# Patient Record
Sex: Female | Born: 1935
Health system: Southern US, Community
[De-identification: ages and names within clinical notes are randomized; demographics above are authoritative.]

## PROBLEM LIST (undated history)

## (undated) DIAGNOSIS — I10 Essential (primary) hypertension: Secondary | ICD-10-CM

## (undated) DIAGNOSIS — E785 Hyperlipidemia, unspecified: Secondary | ICD-10-CM

## (undated) DIAGNOSIS — I4891 Unspecified atrial fibrillation: Secondary | ICD-10-CM

## (undated) DIAGNOSIS — Z950 Presence of cardiac pacemaker: Secondary | ICD-10-CM

## (undated) DIAGNOSIS — Z9889 Other specified postprocedural states: Secondary | ICD-10-CM

## (undated) DIAGNOSIS — E039 Hypothyroidism, unspecified: Secondary | ICD-10-CM

## (undated) DIAGNOSIS — I495 Sick sinus syndrome: Secondary | ICD-10-CM

## (undated) DIAGNOSIS — I480 Paroxysmal atrial fibrillation: Secondary | ICD-10-CM

## (undated) HISTORY — DX: Hyperlipidemia, unspecified: E78.5

## (undated) HISTORY — DX: Essential (primary) hypertension: I10

## (undated) HISTORY — DX: Hypothyroidism, unspecified: E03.9

## (undated) HISTORY — DX: Presence of cardiac pacemaker: Z95.0

## (undated) HISTORY — DX: Paroxysmal atrial fibrillation: I48.0

## (undated) HISTORY — DX: Unspecified atrial fibrillation: I48.91

## (undated) HISTORY — DX: Other specified postprocedural states: Z98.890

## (undated) HISTORY — DX: Sick sinus syndrome: I49.5

---

## 1987-06-29 HISTORY — PX: ABDOMINAL HYSTERECTOMY: SHX81

## 1999-04-27 ENCOUNTER — Other Ambulatory Visit: Admission: RE | Admit: 1999-04-27 | Discharge: 1999-04-27 | Payer: Self-pay | Admitting: Obstetrics and Gynecology

## 2006-07-12 ENCOUNTER — Encounter: Admission: RE | Admit: 2006-07-12 | Discharge: 2006-07-12 | Payer: Self-pay | Admitting: Gastroenterology

## 2007-03-28 HISTORY — PX: NM MYOVIEW LTD: HXRAD82

## 2007-04-29 HISTORY — PX: PACEMAKER PLACEMENT: SHX43

## 2007-05-19 ENCOUNTER — Inpatient Hospital Stay (HOSPITAL_COMMUNITY): Admission: EM | Admit: 2007-05-19 | Discharge: 2007-05-25 | Payer: Self-pay | Admitting: Emergency Medicine

## 2007-06-13 ENCOUNTER — Emergency Department (HOSPITAL_COMMUNITY): Admission: EM | Admit: 2007-06-13 | Discharge: 2007-06-13 | Payer: Self-pay | Admitting: Emergency Medicine

## 2007-08-15 ENCOUNTER — Encounter: Admission: RE | Admit: 2007-08-15 | Discharge: 2007-08-15 | Payer: Self-pay | Admitting: Cardiology

## 2007-08-18 ENCOUNTER — Ambulatory Visit (HOSPITAL_COMMUNITY): Admission: RE | Admit: 2007-08-18 | Discharge: 2007-08-18 | Payer: Self-pay | Admitting: Cardiology

## 2007-08-18 ENCOUNTER — Encounter (INDEPENDENT_AMBULATORY_CARE_PROVIDER_SITE_OTHER): Payer: Self-pay | Admitting: Cardiology

## 2007-11-14 ENCOUNTER — Encounter (INDEPENDENT_AMBULATORY_CARE_PROVIDER_SITE_OTHER): Payer: Self-pay | Admitting: Cardiology

## 2007-11-14 ENCOUNTER — Ambulatory Visit (HOSPITAL_COMMUNITY): Admission: RE | Admit: 2007-11-14 | Discharge: 2007-11-14 | Payer: Self-pay | Admitting: Cardiology

## 2008-01-11 HISTORY — PX: CARDIAC CATHETERIZATION: SHX172

## 2008-01-23 ENCOUNTER — Ambulatory Visit: Payer: Self-pay | Admitting: Thoracic Surgery (Cardiothoracic Vascular Surgery)

## 2008-02-05 ENCOUNTER — Ambulatory Visit: Payer: Self-pay | Admitting: Thoracic Surgery (Cardiothoracic Vascular Surgery)

## 2008-02-05 ENCOUNTER — Encounter
Admission: RE | Admit: 2008-02-05 | Discharge: 2008-02-05 | Payer: Self-pay | Admitting: Thoracic Surgery (Cardiothoracic Vascular Surgery)

## 2008-02-23 ENCOUNTER — Encounter: Payer: Self-pay | Admitting: Cardiothoracic Surgery

## 2008-02-23 ENCOUNTER — Ambulatory Visit: Admission: RE | Admit: 2008-02-23 | Discharge: 2008-02-23 | Payer: Self-pay | Admitting: Cardiothoracic Surgery

## 2008-02-26 ENCOUNTER — Ambulatory Visit: Payer: Self-pay | Admitting: Thoracic Surgery (Cardiothoracic Vascular Surgery)

## 2008-02-27 ENCOUNTER — Ambulatory Visit: Payer: Self-pay | Admitting: Thoracic Surgery (Cardiothoracic Vascular Surgery)

## 2008-02-27 ENCOUNTER — Inpatient Hospital Stay (HOSPITAL_COMMUNITY)
Admission: RE | Admit: 2008-02-27 | Discharge: 2008-03-04 | Payer: Self-pay | Admitting: Thoracic Surgery (Cardiothoracic Vascular Surgery)

## 2008-02-27 HISTORY — PX: MINIMALLY INVASIVE MAZE PROCEDURE: SHX6244

## 2008-02-27 HISTORY — PX: MITRAL VALVE REPAIR: SHX2039

## 2008-03-13 ENCOUNTER — Ambulatory Visit: Payer: Self-pay | Admitting: Thoracic Surgery (Cardiothoracic Vascular Surgery)

## 2008-03-13 ENCOUNTER — Encounter
Admission: RE | Admit: 2008-03-13 | Discharge: 2008-03-13 | Payer: Self-pay | Admitting: Thoracic Surgery (Cardiothoracic Vascular Surgery)

## 2008-03-22 ENCOUNTER — Encounter: Admission: RE | Admit: 2008-03-22 | Discharge: 2008-03-22 | Payer: Self-pay | Admitting: Cardiovascular Disease

## 2008-03-25 ENCOUNTER — Ambulatory Visit: Payer: Self-pay | Admitting: Thoracic Surgery (Cardiothoracic Vascular Surgery)

## 2008-05-20 ENCOUNTER — Ambulatory Visit: Payer: Self-pay | Admitting: Thoracic Surgery (Cardiothoracic Vascular Surgery)

## 2008-05-20 ENCOUNTER — Encounter
Admission: RE | Admit: 2008-05-20 | Discharge: 2008-05-20 | Payer: Self-pay | Admitting: Thoracic Surgery (Cardiothoracic Vascular Surgery)

## 2008-08-12 ENCOUNTER — Ambulatory Visit: Payer: Self-pay | Admitting: Thoracic Surgery (Cardiothoracic Vascular Surgery)

## 2008-08-12 ENCOUNTER — Encounter
Admission: RE | Admit: 2008-08-12 | Discharge: 2008-08-12 | Payer: Self-pay | Admitting: Thoracic Surgery (Cardiothoracic Vascular Surgery)

## 2009-04-07 ENCOUNTER — Ambulatory Visit: Payer: Self-pay | Admitting: Thoracic Surgery (Cardiothoracic Vascular Surgery)

## 2010-03-10 IMAGING — CT CT ANGIO CHEST
4 of 8 series · 13 of 31 positions shown · IV contrast (agent unspecified)
Comparison: CT chest 06/13/2007

CLINICAL DATA: Mitral valve regurgitation..

CT ANGIOGRAPHY CHEST
TECHNIQUE: Multidetector CT imaging of the chest using the
standard protocol during bolus administration of intravenous
contrast. Multiplanar reconstructed images obtained and reviewed to
evaluate the vascular anatomy.
Contrast: 125 ml [id number]

[Series 2: non contrast · axial · 0.66mm/px · z∈[-142,-127]mm · 2 of 51 slices shown]
[im 26/51  lung]
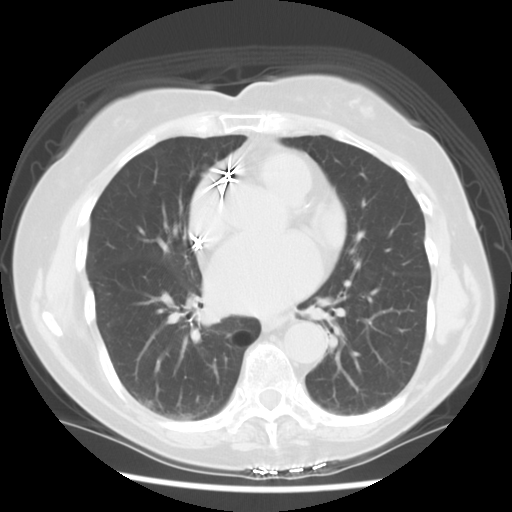
[im 29/51  lung]
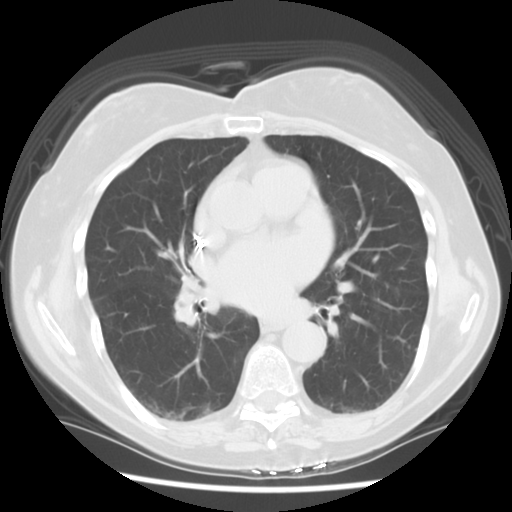

[Series 4: cta chest · axial · 0.66mm/px · z∈[-232,-34]mm · 5 of 133 slices shown]
[im 27/133  lung]
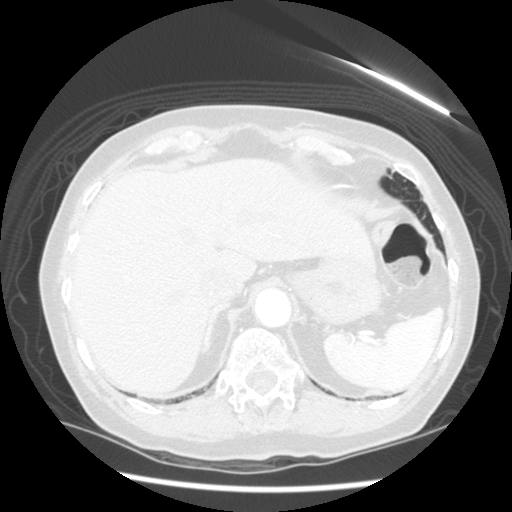
[im 53/133  mediastinal]
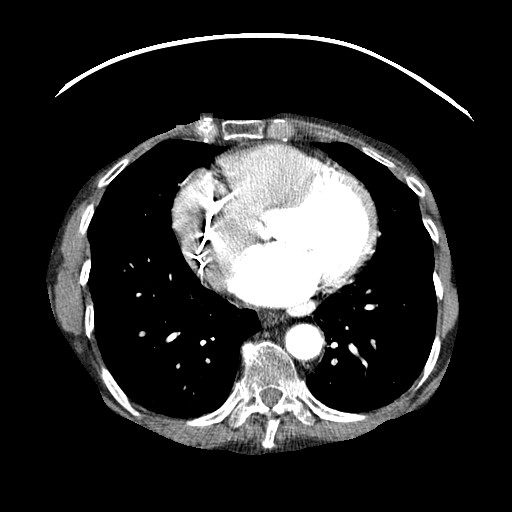
[im 69/133  lung]
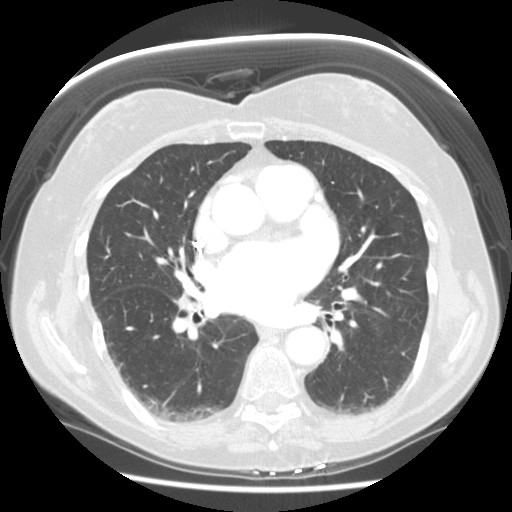
[im 80/133  mediastinal]
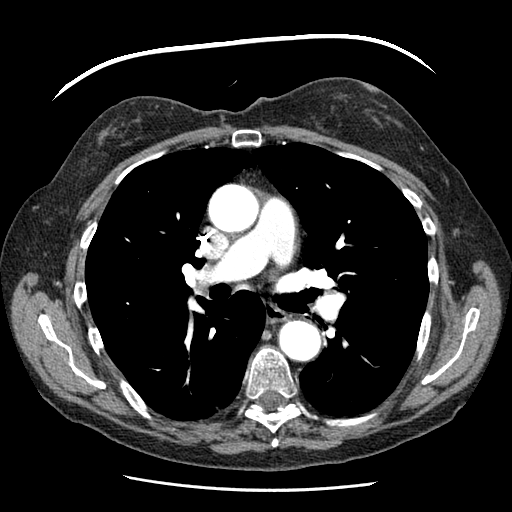
[im 106/133  lung]
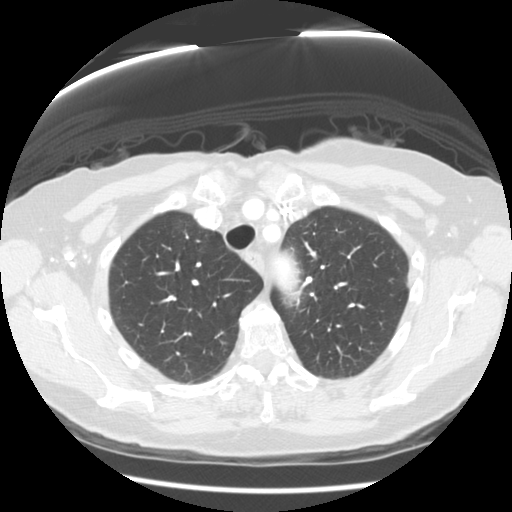

[Series 600: coronal · coronal · 0.66mm/px · 3 of 103 slices shown]
[im 26/103  lung]
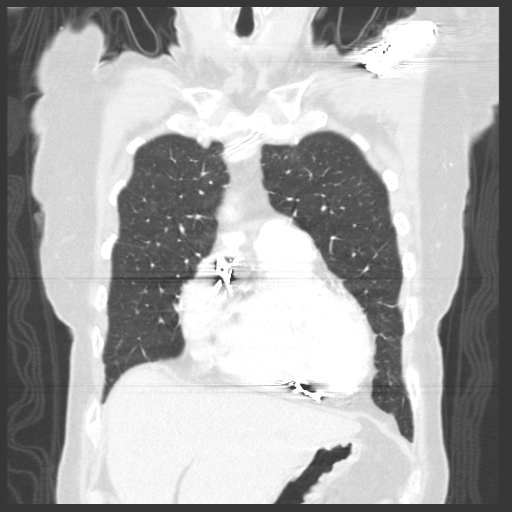
[im 52/103  lung]
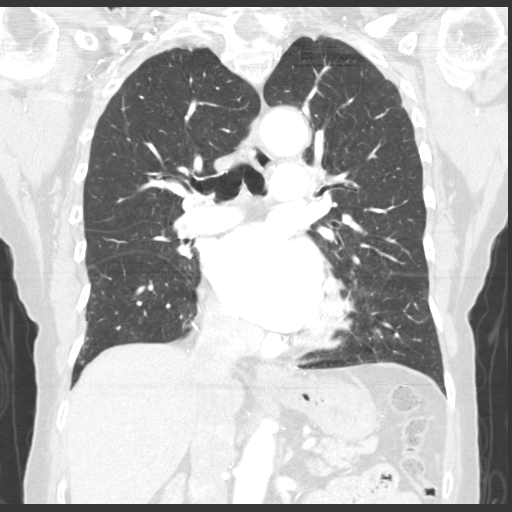
[im 77/103  lung]
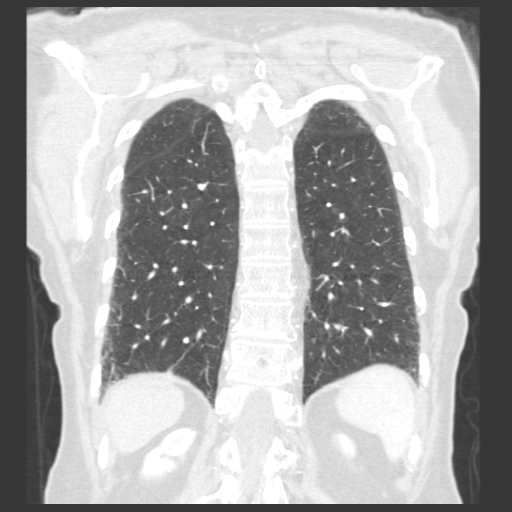

[Series 603: sagittal · sagittal · 0.66mm/px · 3 of 116 slices shown]
[im 29/116  lung]
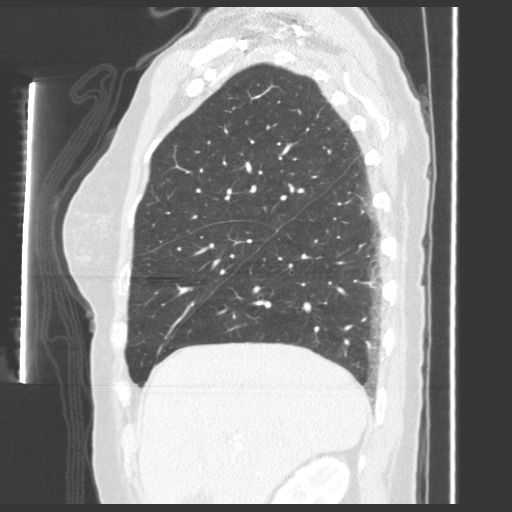
[im 58/116  lung]
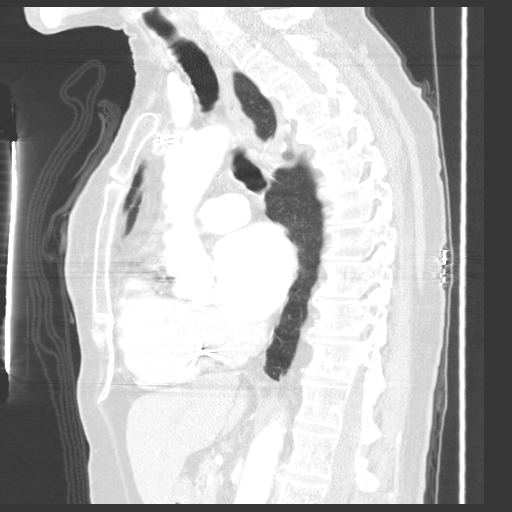
[im 87/116  lung]
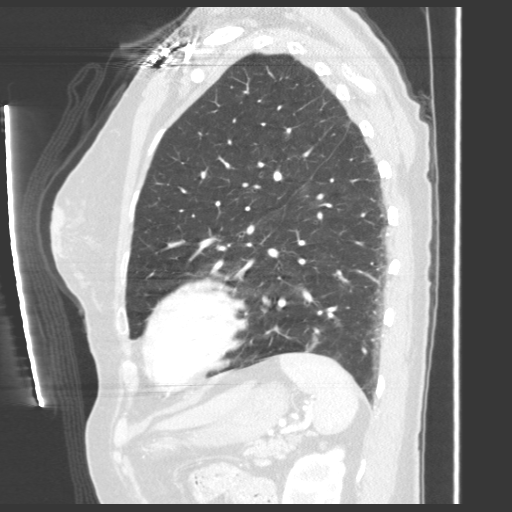

[13 of 31 positions shown; findings below may reference images not displayed]

FINDINGS: The thoracic aorta appears normal with a maximal
transverse diameter of the ascending thoracic aorta of
approximately 3.1 cm in maximal diameter of the descending thoracic
aorta of approximately 3 cm.

Overall heart size is normal.  Transvenous pacemaker is in place.
There is a stable small right lower lobe pleural-based nodule
measuring 3 x 5 mm in diameter on image number 37 of series 5.
This  is unchanged since 06/13/2007 .

There is no hilar or mediastinal adenopathy.

The patient does have two low density lesions in the pancreas, one
in the tail measuring 10 mm in diameter and one in the body
measuring 9 mm in diameter.  These most likely represent tiny
mucinous neoplasms.  I recommend abdominal MRI with contrast for
further evaluation of these lesions.
IMPRESSION: 1.No significant abnormality of the chest.  Specifically, no
evidence of aortic aneurysm.
2.  Small low-density lesions in the pancreas, possibly
representing mucinous neoplasms.  I recommend abdominal MRI for
with contrast with attention to the pancreas for further evaluation
of these small pancreatic lesions.

## 2010-07-20 ENCOUNTER — Encounter: Payer: Self-pay | Admitting: Thoracic Surgery (Cardiothoracic Vascular Surgery)

## 2010-11-10 NOTE — Op Note (Signed)
NAMEEVELLA, KASAL               ACCOUNT NO.:  [id number]   MEDICAL RECORD NO.:  [id number]          PATIENT TYPE:  INP   LOCATION:  2311                         FACILITY:  [facility]   PHYSICIAN:  Salvatore Decent. Cornelius Moras, M.D. DATE OF BIRTH:  [birth year]   DATE OF PROCEDURE:  02/27/2008  DATE OF DISCHARGE:                               OPERATIVE REPORT   PREOPERATIVE DIAGNOSIS:  Bleeding status post right miniature  thoracotomy for mitral valve repair and Maze procedure.   POSTOPERATIVE DIAGNOSIS:  Bleeding status post right miniature  thoracotomy for mitral valve repair and Maze procedure.   PROCEDURE:  Re-exploration for bleeding with extension of right  thoracotomy.   SURGEON:  Salvatore Decent. Cornelius Moras, MD   ASSISTANT:  Doree Fudge, PA-C   SECOND ASSISTANT:  Ms. Gus Puma.   ANESTHESIA:  General.   BRIEF CLINICAL NOTE:  The patient is a 77 year old female who underwent  right miniature thoracotomy for mitral valve repair and Maze procedure  on February 27, 2008.  The patient was initially stable upon return to  the Surgical Intensive Care Unit.  Within the next several hours, the  patient developed increasing amounts of chest tube output, primarily  from the right pleural tube.  The patient developed acute blood loss  anemia with significant drop in hemoglobin and hematocrit.  Repeat chest  x-ray revealed increased opacity of the right hemithorax suggestive of  hemothorax.  The patient is subsequently brought directly back to the  operating room for reexploration.  The patient's husband has been  notified regarding the need for return to the operating room and he  provided consent to proceed with surgery as described.   OPERATIVE NOTE DETAIL:  The patient was brought to the operating room on  the evening of February 27, 2008, and placed in the supine position on  the operating table.  Adequate anesthesia was verified under the care  and direction of Dr. Autumn Patty.   Initially, attempts were made  by Dr. Sampson Goon to use an endobronchial blocker to facilitate single-  lung ventilation, but these were unsuccessful warranting the need to  change the tube to a dual-lumen tube.  The patient's existing single-  lumen endotracheal tube was switched to a dual-lumen endotracheal tube.  This was performed uneventfully.  The patient was positioned with a roll  behind the right shoulder with the arms at the side.  The patient's  chest, abdomen, both groins, and the upper portion of both thighs were  prepared and draped in sterile manner.   The previous right miniature thoracotomy incision was reopened.  There  was evidence of large amount of blood including some clotted blood  within the right hemithorax.  The thoracotomy incision was extended in  both directions to facilitate adequate exposure.  The fourth costal  cartilage was cut anteriorly to facilitate exposure.  All the blood in  the right hemithorax was evacuated.  There was no blood in the  pericardial sac.  The pericardial sutures were removed.  Both the left  and right atriotomy incisions were perfectly intact.  All pacing wire  sites  were intact.  Cardioplegic cannulation sites were intact.  There  was no sign of any significant bleeding from the heart or mediastinum.  The right hemithorax was irrigated with copious warm saline solution.  There was some oozing from the right lateral chest wall associated with  the previous thoracotomy incision and the new extension of the incision  in both directions.  However, no significant bleeding such as an  intercostal bleeder was identified.  All previous port incisions and  trocar incisions were carefully examined.  The internal mammary artery  was intact medially.  All chest tube sites were carefully inspected.  Most significant bleeding was identified at the right apex.  There was  bleeding in the subpleural space suggestive of possible penetrating  injury  to the confluence of the right subclavian vein and right internal  jugular vein where they come together just above the superior vena cava.  The discrete injury cannot be clearly identified, but there was  definitely bleeding from this vicinity.  This area was packed with  gauze, pads, and direct manual pressures applied.  The remainder of the  hemithorax was carefully examined.  Some minor sites of oozing were  identified circumferentially with no other areas of bleeding that might  account for volume of blood loss can be identified.  Throughout this  process, the patient was resuscitated by the Anesthesia team.  Blood  products were administered to return normal hemoglobin and to correct  the patient's underlying coagulopathy.  The areas of oozing in the right  apex were also packed with MRDH to facilitate the topical hemostatic  agent.  This has continued and packing was continued, and the wounds  were carefully reexamined until hemostasis was ascertained.  No other  sites of significant bleeding were identified and again all potential  sites of surgical bleeding were again examined carefully.  A 30-degree  endoscopic scope was utilized to facilitate exposure of the entire chest  wall circumferentially.  The dome of the diaphragm was carefully  examined.  There were no sign of injury to the diaphragm.  No other  abnormalities were identified.  Once adequate hemostasis was  ascertained, the right pleural space was now drained using 36-French  right-angle chest tube and a 28 Bard drain.  The preexisting Bard drain  in the pericardium was left in place.  The right thoracotomy was then  closed in multiple layers with a single sternal wire to reapproximate  the fourth costal cartilage, and the skin incision closed with  subcuticular skin closure.  The patient was transported back to the  Surgical Intensive Care Unit in stable condition.  The dual lumen  endotracheal tube was left in place  due to concerns regarding  significant airway edema and risks potentially associated with another  attempt at changing the endotracheal tube.      Salvatore Decent. Cornelius Moras, M.D.  Electronically Signed     CHO/MEDQ  D:  02/27/2008  T:  02/28/2008  Job:  [id number]

## 2010-11-10 NOTE — Assessment & Plan Note (Signed)
OFFICE VISIT   [name], PRICER  DOB:  [birth year]                                        May 20, 2008  CHART #:  [id number]   HISTORY OF PRESENT ILLNESS:  The patient returns for followup now just  slightly less than 3 months status post right thoracotomy for mitral  valve repair and Cox-CryoMaze procedure.  She was last seen here in the  office on March 25, 2008, at that time, she was progressing fairly  well.  Since then she has continued to improve.  She states that she is  getting around well at this point in time with only very mild exertional  shortness of breath.  This only seems to be noticeable if she goes up a  flight of stairs.  She is otherwise back to fairly normal activity.  She  still has soreness in the right chest wall from her thoracotomy incision  and the area of numbness and hypersensitivity underneath her right  breast.  She also has a wound spot that is sore anteriorly that  corresponds to the costal cartilage associated with her fourth rib  interspace.  She denies any sensation of motion or clicking.  She denies  any fevers, chills, or productive cough.  She denies any sensation of  tachypalpitations.  She has not had any dizzy spells.  She is not aware  of any irregular heart rhythms.  Overall, she has no complaints.  The  remainder of her past medical history is unchanged.   CURRENT MEDICATIONS:  Coumadin, amiodarone, Lopressor, Lasix, Zyrtec,  and Tylenol.   PHYSICAL EXAMINATION:  GENERAL:  Notable for a well-appearing female.  VITAL SIGNS:  Blood pressure 153/83 and pulse is 53.  A 2-channel  telemetry rhythm strip demonstrates what appears to be atrial paced  rhythm.  Oxygen saturation is 99% on room air.  CHEST:  Clear breath sounds, which are symmetrical bilaterally.  No  wheezes or rhonchi are noted.  The right thoracotomy incision has healed  nicely.  CARDIOVASCULAR:  Demonstrates regular rate and rhythm.  No  murmurs,  rubs, or gallops are noted.  ABDOMEN:  Soft, nontender.  EXTREMITIES:  Warm and well perfused.  The right groin incision healed  nicely.  There is mild bilateral lower extremity edema.  The remainder of her physical exam is unremarkable.   IMPRESSION:  Satisfactory progress now almost 3 months following right  thoracotomy for mitral valve repair and a maze procedure.  At this  point, the patient appears to be maintaining atrial paced rhythm.  Her  exercise tolerance has gradually improved.  She seems to be doing  acceptably well under the circumstances.   PLAN:  I have encouraged her to continue to increase her activity as  tolerated.  Dr. Tresa Endo is in the process of weaning her amiodarone, and I  think that she could be tried off of amiodarone within the next month or  two.  If she remains stable for a period of time after that, it is  possible that she could come off of Coumadin entirely.   PLAN:  We will ask the patient to return in 3 months' time for followup  and rhythm check.  All of her questions have been addressed.   Salvatore Decent. Cornelius Moras, M.D.  Electronically Signed   CHO/MEDQ  D:  05/20/2008  T:  05/20/2008  Job:  [id number]   cc:   Nicki Guadalajara, M.D.  Ritta Slot, MD  Antony Madura, M.D.

## 2010-11-10 NOTE — Assessment & Plan Note (Signed)
OFFICE VISIT   [name], [name]  DOB:  29-Oct-1928                                        May 20, 2008  CHART #:  [id number]   ADDENDUM:  The patient underwent chest x-ray at the [facility] today.  This demonstrates stable radiographic appearance  following her recent thoracotomy.  There is a small amount of loculated  pleural fluid laterally in the right mid chest that appears to have  decreased somewhat in size.  There is a very small right pleural  effusion at the base.  Overall, the rest of the lung fields are clear.  No other abnormalities are noted.   Salvatore Decent. Cornelius Moras, M.D.  Electronically Signed   CHO/MEDQ  D:  05/20/2008  T:  05/20/2008  Job:  [id number]

## 2010-11-10 NOTE — Assessment & Plan Note (Signed)
OFFICE VISIT   [name], [name]  DOB:  Nov 04, 1928                                        April 07, 2009  CHART #:  [id number]   HISTORY OF PRESENT ILLNESS:  The patient returns to the office today for  routine followup, now approximately 1 year status post right thoracotomy  for mitral valve repair and Maze procedure.  She was last seen here in  the office on August 12, 2008.  Since then, she has continued to do  well.  However, at the time of her last telephone pacemaker check.  She  apparently was found to have been having atrial fibrillation.  The  details of this evaluation are not available, but subsequent to this  patient has been placed back on amiodarone and Coumadin.  She has never  had any symptoms, and she continues to feel well.  She denies any  shortness of breath.  She reports that she no longer has any significant  residual soreness in her chest, and her activity level is fairly good.  The remainder of her review of systems is unremarkable.  The remainder  of her past medical history is unchanged.   CURRENT MEDICATIONS:  Coumadin, amiodarone,  triamterene/hydrochlorothiazide, metoprolol, and Folast.   PHYSICAL EXAMINATION:  General:  Well-appearing female.  Vital Signs:  Blood pressure 126/69, pulse 63 and 2-channel telemetry rhythm strip  demonstrates paced rhythm.  Chest:  Clear breath sounds, which are  symmetrical.  No wheezes or rhonchi are noted.  Cardiovascular:  Regular  rate and rhythm.  No murmurs, rubs, or gallops are appreciated.  Abdomen:  Soft, nontender.  Extremities:  Warm and well perfused.  There  is mild bilateral lower extremity edema, more so on the left than the  right.  The remainder of her physical exam is unrevealing.   IMPRESSION:  The patient is doing well from a cardiovascular standpoint,  although she apparently was found to have some occult asymptomatic  atrial arrhythmias at the time of her most recent  pacemaker  interrogation over the telephone.  She has been replaced on amiodarone  and Coumadin since then.  The details for rhythm disturbance are not  currently available, and she has not yet been seen in follow up by Dr.  Tresa Endo.  She states that she does have an appointment with him within the  next month or so.   PLAN:  We will defer any subsequent decisions regarding long-term  management of atrial fibrillation to Dr. Tresa Endo and his colleagues.  All  of her questions have been addressed.   Salvatore Decent. Cornelius Moras, M.D.  Electronically Signed   CHO/MEDQ  D:  04/07/2009  T:  04/08/2009  Job:  [id number]   cc:   Nicki Guadalajara, M.D.  Ritta Slot, MD  Antony Madura, M.D.

## 2010-11-10 NOTE — Discharge Summary (Signed)
[name], REEG NO.:  [id number]   MEDICAL RECORD NO.:  [id number]          PATIENT TYPE:  INP   LOCATION:  3702                         FACILITY:  [facility]   PHYSICIAN:  Nicki Guadalajara, M.D.     DATE OF BIRTH:  Nov 27, 1928   DATE OF ADMISSION:  05/19/2007  DATE OF DISCHARGE:  05/25/2007                               DISCHARGE SUMMARY   DISCHARGE DIAGNOSIS:  1. Sick sinus syndrome with break-through paroxysmal atrial      fibrillation, but bradycardia on increased antiarrhythmics.  2. Anticoagulation.  3. Significant bradycardia, resulting in permanent transvenous      pacemaker.  4. Abdomen chest x-ray.  Will follow up as an outpatient.   DISCHARGE CONDITION:  Improved.   PROCEDURES:  May 24, 2007, dual-chamber permanent transvenous  pacemaker placed in the left subclavian by Dr. Lenise Herald, an  EnRhythm [phone] [employer].   DISCHARGE MEDICATIONS:  1. Lopressor 50 mg twice a day.  2. Coumadin 5 mg daily beginning May 26, 2007.  3. Aspirin 81 mg daily.  4. Rythmol 225 mg 3 times a day.   DISCHARGE INSTRUCTIONS:  1. Low-sodium heart-healthy diet.  2. Have blood work done on Monday, including INR.  3. Follow up for wound check on June 01, 2007, at 9:15 a.m.  4. Follow up with Dr. Tresa Endo in 2 weeks.  The office will call with      date and time.  STOP TAKING DILTIAZEM AND PLAVIX.  5. Pacemaker movement sheet was given, and instructions on bathing,      driving and problems.   HISTORY OF PRESENT ILLNESS:  77 year old white married female who  presented to the emergency room May 19, 2007, secondary to rapid  ventricular response, atrial fibrillation.  She has a history of PAF  since her 30s.  She has been treated with Norpace in the past, but she  had breakthrough PAF on a monitor.  She was changed to Rythmol 150 twice  a day for 2 weeks, and then t.i.d.  When she was seen in followup,  May 15, 2007, she was in atrial fib with a  decreased ventricular  response 4550.  Lanoxin was discontinued, and Lopressor was  discontinued, her aspirin was decreased to 81, and Plavix was added.  She  had refused Coumadin at that point.   In October 2008, her diltiazem had been decreased to 180 from 240, but  had not started the 180.  Now, she presents with rapid AFib.   PAST MEDICAL HISTORY:  1. Negative coronary disease.  2. Negative __________ September 2008.  3. Normal 2-D echo in September 2008.  4. She has had a hysterectomy.   OUTPATIENT MEDICATIONS:  Plavix, aspirin and Rythmol.   NO KNOWN ALLERGIES.   FAMILY HISTORY/SOCIAL HISTORY/REVIEW OF SYSTEMS:  See H&P.   DISCHARGE PHYSICAL EXAM:  Blood pressure 118/56, pulse 62, resps 18,  temp 97.6.  LUNGS:  Clear.  HEART SOUNDS:  S1, S2; regular rate and rhythm.  CHEST WALL SITE:  No hematoma; stable.   LABORATORY DATA:  Admitting hemoglobin 13.8, hematocrit 41.1, WBC 6.2,  platelets 224, neutrophils 56, lymphs 36, mono 8, EO 0, baso 0.  These  remained stable throughout hospitalization.  PT on admission 12.8.  INR  of 0.9.  That remained stable.   Chemistry:  Sodium 139, potassium 3.6, chloride 105, CO2 25, glucose  105, BUN 13, creatinine 0.89.  These remained stable.  There was one  episode; I am not quite sure if this was her blood or not, her potassium  had dropped to 3, her glucose was 52, BUN 13, but her creatinine was  3.85.  This was rechecked within 3 hours, and the new creatinine was  1.08, and the glucose was 108.  I am not sure if that is actually her  blood or lab error.   Her GFR does run in the 40s though.   Total protein 4.2, albumin 1.7, AST 16, ALT 9, ALP 74, total bili 0.7.   CK range 191, 161 and 136.  MB 7.4, 5.1, and 3.9.  Troponin I 0.03,  0.01.   Chest x-ray 2-view prior to procedure on the 24th, hyperinflation  consistent with COPD, asthma.  No acute cardiopulmonary disease.  Stable  since January 2008.  The followup after the pacer  was placed, negative  for pneumo.  Status post pacemaker placement, emphysematous changes, and  then a nodular structure in the right lung apex.  Difficult to exclude  some interval enlargement.  Recommend followup CT for better  characterization.  Please note, patient was discharged on Thanksgiving.  At time of discharge, she was ready to go, and due to just recent  placement of the pacemaker, we decided to hold off on doing the CT until  she could be seen as an outpatient, and then would send her for it.  EKG  on admission, ATRIAL FIBRILLATION, rate 63.  Rate slower than  previously.  On the 25th, atrial fib, nonspecific changes, and on the  27th, she had appropriately pacing endstenting.   HOSPITAL COURSE:  The patient was admitted May 19, 2007 with rapid  ventricular response, atrial fibrillation.  She was placed on IV  Cardizem, IV heparin, and due to her breakthrough atrial fib on other  medications, and bradycardia once you increase the medications, it was  felt she would need a permanent pacemaker.  Patient at this point though  was willing for Coumadin, and patient was placed on Coumadin.  Then she  started having 3.42-second pauses.  Coumadin was held, and plans for a  permanent pacemaker.  Also, her meds were adjusted to decrease the  dosages.  Patient maintained to be stable, and then underwent permanent  pacemaker on November 26th, tolerating that well.  By the 17th, she was  stable, ambulating without complaints.  Vital signs were  stable.  Other than her chest x-ray, which would be followed as an  outpatient, she was ready for discharge home.  She was made aware of the  chest x-ray and that she would need a CAT scan at some point in the next  2-3 weeks.  She would start the Coumadin on November 28th.      [name]. [name], N.P.    ______________________________  Nicki Guadalajara, M.D.    LRI/MEDQ  D:  06/12/2007  T:  06/13/2007  Job:  [id number]   cc:   Nicki Guadalajara, M.D.  Antony Madura, M.D.  Darlin Priestly, MD

## 2010-11-10 NOTE — Assessment & Plan Note (Signed)
OFFICE VISIT   NASRIN, LANZO  DOB:  Mar 28, 1929                                        August 12, 2008  CHART #:  [id number]   ADDENDUM   The patient underwent followup chest x-ray today at the [facility].  This demonstrates very mild right lower lobe pleural  parenchymal scarring with improved aeration in the right lung base.  There is no significant pleural effusion.  The lung fields are otherwise  clear.  No other abnormalities are noted.   Salvatore Decent. Cornelius Moras, M.D.  Electronically Signed   CHO/MEDQ  D:  08/12/2008  T:  08/12/2008  Job:  [id number]

## 2010-11-10 NOTE — Consult Note (Signed)
NEW PATIENT CONSULTATION   [name], [name]  DOB:  [birth year]                                        January 23, 2008  CHART #:  [id number]   PRIMARY CARE PHYSICIAN:  Antony Madura, MD   REASON FOR CONSULTATION:  Severe mitral regurgitation and permanent  atrial fibrillation.   HISTORY OF PRESENT ILLNESS:  The patient is a 77 year old married white  female from [place], [place], who has been followed by Dr. Tresa Endo  for the last couple of years with atrial fibrillation.  The patient  reports that she has allegedly had irregular heart rhythm for many  years.  She developed symptomatic tachy palpitations and shortness of  breath in 2008, and underwent permanent pacemaker placement for sick  sinus syndrome with tachybrady syndrome and recurrent paroxysmal atrial  fibrillation.  Following this, she continued to have problems with  persistent atrial fibrillation and she underwent DC cardioversion on 2  separate occasions, each time returning to atrial fibrillation.  In May  of this year, she underwent transesophageal echocardiogram.  This  revealed severe mitral regurgitation with dilated left atrium.  She has  been seen back in followup by Dr. Tresa Endo since that time, and she has now  been referred to consider elective surgical intervention.   REVIEW OF SYSTEMS:  General:  The patient reports stable appetite.  She  has not been gaining or losing significant weight, although she may have  gained a few pounds over the last year or so.  She does report some  exertional fatigue.  Cardiac:  The patient denies any history of chest  pain, chest tightness, or chest pressure either with activity or at  rest.  The patient does describe some exertional shortness of breath,  although this does not seem to slow her down too much.  She has had some  mild lower extremity edema that is chronic and stable.  She has had  longstanding history of tachy palpitations and irregular  heart rhythm.  She has not had any dizzy spells or syncopal episodes.  Respiratory:  Notable for the absence of any ongoing productive cough, hemoptysis, or  wheezing.  Gastrointestinal:  Negative.  The patient reports no  difficulty swallowing.  She reports stable bowel function.  She denies  hematochezia, hematemesis, or melena.  Genitourinary:  Negative.  Musculoskeletal:  Negative.  Neurologic:  Notable for some chronic  numbness involving both lower legs related to peripheral neuropathy.  It  causes her some instability gait and slow ambulation.  This is stable.  Peripheral Vascular:  Negative.  Psychiatric:  Negative.  HEENT:  Negative.  The patient sees her dentist regularly and has had no recent  dental problems.  She has had no recent change in her eyesight.   PAST MEDICAL HISTORY:  1. Permanent atrial fibrillation.  2. Peripheral neuropathy.  3. Scarlet fever at age 88.   PAST SURGICAL HISTORY:  Hysterectomy in 1989.   FAMILY HISTORY:  Noncontributory.   SOCIAL HISTORY:  The patient is married and lives with her husband and 3  grown children in [place].  She remains reasonably active physically for  her age and continues to drive an automobile and attend to her basic  daily needs.  She is a nonsmoker.  She denies history of tobacco use.   CURRENT MEDICATIONS:  1.  Lopressor 50 mg every morning and 25 mg every evening.  2. Coumadin as directed.  3. Ramipril 5 mg twice daily.   DRUG ALLERGIES:  None known.   PHYSICAL EXAMINATION:  GENERAL:  The patient is well-appearing female  who appears her stated age, in no acute distress.  VITAL SIGNS:  Blood pressure 131/73, pulse 72 and irregular.  Oxygen  saturation 98%.  HEENT:  Grossly unrevealing.  NECK:  There is no palpable lymphadenopathy.  There are no carotid  bruits.  CHEST:  Auscultation of the chest demonstrates clear breath sounds,  which are symmetrical bilaterally.  No wheezes or rhonchi are noted.   CARDIOVASCULAR:  Irregular heart rhythm.  There is a grade 2/6 systolic  murmur heard intermittently at the apex.  No diastolic murmurs are  noted.  ABDOMEN:  Soft, nondistended, and nontender.  There are no palpable  masses.  Bowel sounds are present.  EXTREMITIES:  Warm and adequately perfused.  Pulses are easily palpable  in both groins.  Distal pulses are diminished at the ankle.  There is  mild bilateral lower extremity edema.  There are mild changes of chronic  venous insufficiency.  There are no areas of skin breakdown, although  the skin is somewhat thin and frail.  RECTAL AND GU:  Both deferred.  NEUROLOGIC:  Grossly nonfocal and symmetrical throughout.  The patient  does report decreased sensation in both feet.   DIAGNOSTIC TESTS:  Transesophageal echocardiogram performed by Dr.  Lynnea Ferrier on Nov 14, 2007, is reviewed.  This demonstrates normal left  ventricular systolic function with severe (4+) mitral regurgitation.  The functional anatomy of the mitral valve appears to be consistent with  rheumatic mitral valve disease with type IIIA functional restriction of  both the anterior and posterior leaflets during both systole and  diastole.  There is a very broad central jet of regurgitation that fills  the left atrium.  However, the mitral valve leaflets do not appear to be  severely thickened, and the subvalvular apparatus looks reasonably  normal.  The left atrium is severely enlarged.  There is mild-to-  moderate tricuspid regurgitation.  There is no patent foramen ovale.  No  other abnormalities are noted and the aortic valve appears normal.   Cardiac catheterization performed on July 16th by Dr. Tresa Endo is reviewed.  This demonstrates mildly dilated left ventricle with ejection fraction  estimated 40%.  There is severely enlarged left atrium.  There is  insignificant coronary artery disease with left dominant coronary  circulation.  The infrarenal aorta and bilateral iliac  arteries appear  normal without significant atherosclerotic disease.  Right heart  catheterization is notable for the presence of significant pulmonary  hypertension.  Specifically, PA pressure measured 38/12 with pulmonary  capillary wedge pressure A wave of 12, but a large V wave of 30.  Cardiac output was 3.8 liters per minute corresponding to a cardiac  index of 2.0 using thermodilution.  There was severe mitral  regurgitation.   IMPRESSION:  Severe mitral regurgitation and permanent atrial  fibrillation with mild symptoms of exertional shortness of breath.  Recent transesophageal echocardiogram suggest that the patient's  underlying mitral valve disease may be due to rheumatic fever, and as  such the likelihood of valve repair may be somewhat diminished.  Nevertheless, I suspect that the patient would benefit from elective  mitral valve repair or replacement with concomitant maze procedure.  She  might be reasonable candidate for minimally invasive approach.   PLAN:  I  have discussed the options at length with the patient and her  husband here in the office today.  Alternative treatment strategies have  been reviewed and long-term prognosis with medical therapy has been  discussed.  They desire to think things over a bit before making a final  decision.  We will obtain chest CT scan to evaluate the thoracic aorta,  rule out the presence of significant atherosclerotic disease.  That  might increase risk with an attempted approach using  minimally invasive techniques.  We will plan to see her back in 2 weeks'  time to discuss matters further.  All of their questions have been  addressed.   Salvatore Decent. Cornelius Moras, M.D.  Electronically Signed   CHO/MEDQ  D:  01/23/2008  T:  01/24/2008  Job:  [id number]   cc:   Nicki Guadalajara, M.D.  Ritta Slot, MD  Antony Madura, M.D.

## 2010-11-10 NOTE — Op Note (Signed)
NAMEESTEPHANY, PEROT               ACCOUNT NO.:  [id number]   MEDICAL RECORD NO.:  [id number]          PATIENT TYPE:  INP   LOCATION:  2311                         FACILITY:  [facility]   PHYSICIAN:  Guadalupe Maple, M.D.  DATE OF BIRTH:  1929/02/23   DATE OF PROCEDURE:  02/27/2008  DATE OF DISCHARGE:                               OPERATIVE REPORT   PROCEDURE:  Intraoperative transesophageal echocardiography.   Ms. Corynn Solberg is a 77 year old white female with a history of mitral  regurgitation and atrial fibrillation who is scheduled to undergo mitral  valve repair or replacement by Dr. Tressie Stalker.  Intraoperative  transesophageal echocardiography was requested to evaluate the mitral  valve and to determine if any other valvular pathology was present and  to serve as a monitor for intraoperative volume status.   The patient was brought to the operating room of [facility] and  general anesthesia was induced without difficulty.  The trachea was  intubated without difficulty.  The transesophageal echocardiography  probe was then inserted into the esophagus without difficulty.   The impression prebypass findings:  1. Mitral valve.  The mitral valve leaflets appeared to open normally.      There do not appear to be restricted opening and the subvalvular      apparatus did not appear fused or calcified.  The mean gradient by      continuous wave Doppler was 1.9 mmHg.  There was moderate mitral      regurgitation.  There was a central jet with no evidence of leaflet      prolapse or fluttering or flail segments.  The regurgitant orifice      area using the pedis method was 0.2 cm.  Regurgitant volume was 46      mL.  2. Aortic valve.  The aortic valve was trileaflet and appeared open      normally.  There was no significant calcification.  The aortic      leaflets noted.  They appeared thin and friable.  There was no      aortic insufficiency noted.  3. Left ventricle.  Left  ventricular cavity appeared to be within      normal limits and size.  Good contractility and all segments      interrogated.  Ejection fraction estimated at 60%.  Left      ventricular wall thickness measured 0.95 cm at end diastole of the      posterior wall at the mid papillary level.  There was no thrombus      noted at the left ventricular apex.  4. Right ventricle.  The right ventricular size was within normal      limits.  There was good contractility of the right ventricular free      wall.  5. Tricuspid valve.  The tricuspid valve appeared structurally intact      with trace tricuspid insufficiency.  6. Interatrial septum.  The interatrial septum was intact without      evidence of patent foramen ovale or atrial septal defect by color  Doppler and bubble study.  7. Left atrium.  The left atrium was enlarged and measured 6.7 cm in      the posterosuperior dimension.  There was no thrombus noted in the      left atrial cavity or left atrial appendage.  8. Ascending aorta.  The ascending aorta appeared to be normal      contour, it was not aneurysmal.  There was no significant      atheromatous disease noted.   HISTORY OF PRESENT ILLNESS:  Descending aorta.  The descending aorta  appeared to be normal with no atheromatous disease noted and measured  2.4 cm in diameter.   POST BYPASS FINDINGS:  1. Mitral valve.  The mitral valve showed an annuloplasty ring and      then placed, the posterior leaflet was immobile and the anterior      leaflet appeared to open with the characteristic of trapdoor      appearance with an annuloplasty ring.  There was no mitral      insufficiency noted.  2. Aortic valve.  The aortic valve appeared unchanged from the      prebypass study except there was trace aortic insufficiency noted      in the button not in all views.  The leaflets appeared to open      normally and they appeared thin and friable.  3. Left ventricle.  The left ventricular  contractile pattern was      somewhat altered due to ventricular pacing, but there was good      contractility in all segments interrogated.  The ejection fraction      was estimated at 50%-55%.  4. Right ventricle.  The right ventricular cavity initially appeared      enlarged, but after 20-30 minutes, the function improved with      improved contractility of the right ventricular free wall and it      appeared to have close to normal function at the end of the      surgery.  5. Interatrial septum.  The interatrial septum again appeared intact      without evidence of patent foramen ovale or atrial septal defect.           ______________________________  Guadalupe Maple, M.D.     DCJ/MEDQ  D:  02/27/2008  T:  02/28/2008  Job:  [id number]

## 2010-11-10 NOTE — Discharge Summary (Signed)
NAMEODESSA, [name]               ACCOUNT NO.:  [id number]   MEDICAL RECORD NO.:  [id number]          PATIENT TYPE:  INP   LOCATION:  2017                         FACILITY:  [facility]   PHYSICIAN:  Salvatore Decent. Cornelius Moras, M.D. DATE OF BIRTH:  Nov 04, 1928   DATE OF ADMISSION:  02/27/2008  DATE OF DISCHARGE:                               DISCHARGE SUMMARY   PRIMARY ADMITTING DIAGNOSES:  1. Severe mitral regurgitation.  2. Permanent atrial fibrillation.   ADDITIONAL/DISCHARGE DIAGNOSES:  1. Severe mitral regurgitation.  2. Permanent atrial fibrillation.  3. Postoperative bleeding status post mitral valve repair.  4. History of sick sinus syndrome status post permanent pacemaker      placement.  5. History of peripheral neuropathy.  6. History of scarlet fever as a child.  7. Postoperative acute blood loss anemia.  8. Postoperative thrombocytopenia.  9. Postoperative volume overload.   PROCEDURES PERFORMED:  1. Right mini thoracotomy with mitral valve repair (26-mm Edwards      physio 2 ring annuloplasty).  2. Cox CryoMaze procedure.  3. Re-exploration of right thoracotomy for bleeding with extension of      thoracotomy incision.   HISTORY:  The patient is a 77 year old female, patient of Dr. Nicki Guadalajara, with a history of exertional shortness of breath.  She has had a  long history of atrial fibrillation, as well as tachybrady syndrome.  She is failed direct cardioversion on 2 separate occasions and  subsequently required placement of a permanent pacemaker.  In May 2009,  she underwent a transesophageal echocardiogram, which revealed severe  mitral regurgitation with a dilated left atrium.  Because her symptoms  had worsened, it was recommended that she be considered for surgical  intervention at this time.  She was seen in the office in consultation  by Dr. Tressie Stalker and all of her studies were reviewed.  Echocardiogram demonstrated normal left ventricular function with severe  4+ MR.  This was confirmed by TEE, and left and right heart  catheterization subsequently showed no evidence of significant coronary  artery disease.  Dr. Cornelius Moras felt that the patient was a good candidate for  surgical mitral valve repair and explained all risks, benefits, and  alternatives of surgery to the patient.  Consent was obtained, and she  was scheduled for outpatient admission for surgery on February 27, 2008.   HOSPITAL COURSE:  [name] was admitted to the [facility] on  February 27, 2008, and underwent a right mini thoracotomy with mitral  valve repair and Cox-Maze procedure, performed by Dr. Cornelius Moras as described  in detail above.  Postoperative TEE showed no residual MR.  She was  transferred to the SICU postoperatively in stable condition.  She  initially did well, however, later that evening, she developed increased  chest tube output.  A chest x-ray was obtained, which showed a right  opacity which was consistent with a hemothorax.  Also her labs showed  evidence of acute blood loss anemia.  Because of these reasons, she was  returned to the operating room for re-exploration for bleeding.  Intraoperative findings showed bleeding primarily  from the apex of the  right chest wall and diffuse coagulopathy with oozing from the chest  wall.  She was returned back to the SICU in stable condition.  She was  treated for her significant coagulopathy with FFP and platelets.  She  remained sedated on the vent until late in the day postop day #1.  At  that time, she was weaned and extubated without problem.  Because of her  thrombocytopenia, all aspirin and Coumadin withheld in order to allow  her platelets to recover.  She remained stable and was subsequently able  to be transferred to the step-down unit later in the day on postop day  #2.  Her postoperative course has, otherwise, been uneventful.  She has  been slowly restarted on her home medications.  Her blood counts are   improving with her most recent CBC on March 01, 2008, showing a  hemoglobin of 10, hematocrit 30, platelets 80,000, white count 9.3.  Her  other remaining labs show sodium 140, potassium 4.1, BUN 16, creatinine  1.13.  At this time, 2 of her 3 chest tubes have been discontinued, and  the remaining tube shows minimal output and no air leak.  It is  anticipated that this tube will be discontinued within the next 24  hours.  She continues to remain sore and somewhat weak and deconditioned  but overall is progressing well.  Her incisions are all healing well.  She has been started on Lasix for volume overload and is diuresing well.  She remains approximately 2 kg above her preoperative weight with mild  lower extremity edema on physical exam.  She has been afebrile, and all  vital signs have been stable.  She remains on 2 liters of supplemental  oxygen but is maintaining O2 sats of greater than 90%.  She is currently  progressing with cardiac rehab, walking up to 150 feet at a time with  assistance.  She is tolerating a regular diet and is having normal bowel  and bladder function.  She will continue to be observed for the next 48-  72 hours.  Over this time, we anticipate discontinuing her remaining  chest tube and weaning her completely from supplemental oxygen.  If she  remains stable and continues to progress.  She will hopefully be ready  for discharge home by March 04, 2008.  The patient's risk of  rebleeding is felt too high for resuming anticoagulation with coumadin  at this time.   Discharge medications are as follows:  1. Amiodarone 200 mg b.i.d.  2. Metoprolol 25 mg b.i.d.  3. Ramipril 5 mg daily.  4. Ultram 50-100 mg q.4 h. p.r.n. for pain.  5. Enteric coated aspirin 325 mg daily.   Also a decision will be made at the time of discharge about whether to  start her on aspirin based on her platelet function and also whether to  continue her diuretic.   DISCHARGE  INSTRUCTIONS:  She is asked to refrain from driving, heavy  lifting, or strenuous activity.  She may continue ambulating daily and  using her incentive spirometer.  She may shower daily and clean her  incisions with soap and water.  She will continue a low-fat, low-sodium  diet.   DISCHARGE FOLLOWUP:  She will need to make an appointment to see Dr.  Tresa Endo in 2 weeks.  She will follow up with Dr. Cornelius Moras on March 11, 2008, with a chest x-ray.  In the interim, if she experiences  any  problems or has questions, she is asked to contact our office.      Coral Ceo, P.A.      Salvatore Decent. Cornelius Moras, M.D.  Electronically Signed    GC/MEDQ  D:  03/01/2008  T:  03/02/2008  Job:  [id number]   cc:   Antony Madura, M.D.  Nicki Guadalajara, M.D.

## 2010-11-10 NOTE — Op Note (Signed)
[name], [name]               ACCOUNT NO.:  [id number]   MEDICAL RECORD NO.:  [id number]          PATIENT TYPE:  OUT   LOCATION:  [facility]                         FACILITY:  [facility]   PHYSICIAN:  Salvatore Decent. Cornelius Moras, M.D. DATE OF BIRTH:  [birth year]   DATE OF PROCEDURE:  02/27/2008  DATE OF DISCHARGE:  02/23/2008                               OPERATIVE REPORT   PREOPERATIVE DIAGNOSES:  1. Severe mitral regurgitation.  2. Permanent atrial fibrillation.   POSTOPERATIVE DIAGNOSES:  1. Severe mitral regurgitation.  2. Permanent atrial fibrillation.   PROCEDURE:  Right miniature thoracotomy for mitral valve repair (26-mm  Edwards Physio II ring annuloplasty) and Cox CryoMaze procedure.   SURGEON:  Salvatore Decent. Cornelius Moras, MD   ASSISTANT:  Allyn Kenner, MD   ANESTHESIA:  General.   BRIEF CLINICAL NOTE:  The patient is a 77 year old female from [place],  [place] with known history of atrial fibrillation status post  permanent pacemaker placement for sick sinus syndrome with tachybrady  syndrome.  She ultimately developed permanent atrial fibrillation that  failed DC cardioversion.  More recently the patient has developed severe  mitral regurgitation with symptoms of mild exertional shortness of  breath.  Echocardiogram demonstrated presence of normal left ventricular  function with severe (4+) mitral regurgitation.  Transesophageal  echocardiogram confirms similar findings, and left and right heart  catheterization was notable for the absence of any significant coronary  artery disease.  A full consultation note has been dictated previously.  The patient and her husband have been counseled at length regarding the  indications, risks, and potential benefits of surgery.  Alternative  treatment strategies have been discussed.  They understand and accept  all associated risks and desire to proceed with surgery as described.   OPERATIVE FINDINGS:  1. Normal left ventricular systolic  function.  2. Functional type I mitral regurgitation with annular dilatation.  3. No evidence for rheumatic valvular heart disease.  4. No residual mitral regurgitation following successful mitral valve      repair on postoperative transesophageal echocardiogram.   OPERATIVE NOTE IN DETAIL:  The patient was brought to the operating room  on the above-mentioned date and placed in the supine position on the  operating table.  Intravenous antibiotics were administered.  Central  monitoring was established including placement of central venous  catheter in the left internal jugular approach.  Attempts were made to  pass a Swan-Ganz catheter from the left internal jugular approach but  these were unsuccessful.  A radial arterial line was placed.  General  endotracheal anesthesia was induced uneventfully under the care and  direction of Dr. Kipp Brood.  Initially, a dual lumen endotracheal  tube was placed.  Appropriate position of the endotracheal tube was  verified.  A Foley catheter was placed.  The patient was placed in the  supine position with a roll underneath the right scapula to elevate the  right side 20 degrees.  The neck was gently extended and turned towards  the left.  The patient's right neck, chest, abdomen, both groins, and  both lower extremities were prepared and  draped in a sterile manner.   Baseline transesophageal echocardiogram was performed by Dr. Noreene Larsson.  This demonstrates functional type 1 mitral regurgitation with annular  dilatation and incomplete coaptation of the mitral valve leaflets.  The  leaflets themselves appeared reasonably mobile.  The subvalvular  apparatus appeared normal and there was nothing to suggest the patient  has underlying rheumatic valvular heart disease as had been suspected  preoperatively.  There was normal left ventricular systolic function.  There was left and right atrial enlargement.  There was trivial  tricuspid regurgitation.  No  other abnormalities were noted.   A small incision was made in the right inguinal crease.  The right  common femoral artery and right common femoral vein were dissected away  from associated structures over a short distance through this incision.  Only the anterior surface of the vessels were exposed.  Femoral artery  was slightly small caliber but otherwise normal in appearance.  There  was no sign of any atherosclerotic plaque.   A right miniature thoracotomy incision was made in the right  inframammary groove.  The right chest was entered through the fourth  intercostal space after beginning single lung ventilation.  Exposure  appeared to be satisfactory.  A 10-mm port was placed through a separate  stab incision in the right anterior axillary line inferiorly.  The right  chest was insufflated with carbon dioxide gas continuously throughout  the duration of the operation.  A soft tissue retractor was placed  through the miniature thoracotomy incision.  A longitudinal incision was  made in the pericardium 2 cm anterior to the phrenic nerve.  Exposure  was felt to be satisfactory.   The right common femoral vein was cannulated using a Seldinger technique  and a long flexible guidewire was advanced up through the femoral vein  through the inferior vena cava through the right atrium into the  superior vena cava using transesophageal echocardiogram for guidance.  Subsequently, the right internal jugular vein was cannulated using a  Seldinger technique and a flexible guidewire was advanced down through  the internal jugular vein into the superior vena cava, again using  transesophageal echocardiogram for guidance.  The patient was now  heparinized systemically.  A 22-French long flexible femoral venous  cannula was advanced over serial dilators up through the femoral vein  through the inferior vena cava through the right atrium until the tip  extends into the superior vena cava.   Concentric pursestring CV-4 Gore-  Tex sutures were placed on the anterior surface of the femoral artery.  The femoral artery was then cannulated using a Seldinger technique.  The  17-French femoral arterial cannula was advanced over serial dilators  uneventfully.  A 16-French femoral cannula was then advanced into the  internal jugular vein until the tip extended into the superior vena cava  over serial dilators through the neck.  Adequate heparinization was  verified.   Cardiopulmonary bypass was begun.  The incision in the pericardium was  extended in both directions and pericardial traction sutures were then  placed.  Diaphragmatic retraction suture was not necessary.  Exposure  was felt to be excellent.  Cannulation sutures were placed on the right  anterolateral surface of the proximal ascending aorta.  A similar  pursestring suture was placed in the right atrium, and a retrograde  cardioplegic catheter was then placed through the right atrium into the  coronary sinus using transesophageal echocardiogram for guidance.  An  antegrade cardioplegic cannula was then  placed directly in the ascending  aorta and secured in place.   The patient was cooled to 28 degrees systemic temperature.  Aortic cross-  clamp was applied and cold blood cardioplegia was administered initially  in antegrade fashion through the aortic root.  Supplemental cardioplegia  was administered retrograde through the coronary sinus catheter.  The  initial cardioplegic arrest was felt to be excellent.  Repeat doses of  cardioplegia were administered intermittently throughout the cross-clamp  portion of the operation every 20 minutes to maintain completely  isoelectric electrocardiogram.   Left atriotomy incision was performed posteriorly through the  interatrial groove and extended across the back wall of the left atrium  through the oblique sinus.  The floor of the left atrium and the mitral  valve were exposed  using the left atrial retractor placed with the bar  exited through the fourth intercostal space just to the right side of  the sternum.  Exposure was felt to be excellent.   A Cox CryoMaze procedure was performed using ATS flexible cryoprobe  under direct vision.  Entire left atrial lesion set was completed  including an encircling ellipse around the pulmonary veins as well as a  lesion placed directly across the floor of the left atrium to reach the  midportion of the mitral annulus posteriorly and a similar near lesion  placed across the back wall of the epicardial surface of the left atrium  to reach the coronary sinus on the underside of the left atrium through  the oblique sinus.  This completes the left-sided lesion set of the Cox  maze procedure.   The left atrial appendage was oversewn from within the left atrium using  a two-layer closure of running 3-0 Prolene suture.   The mitral valve was carefully examined.  The mitral valve leaflets  appeared essentially normal and flexible.  The subvalvular apparatus  appeared normal and there was no sign of any rheumatic degenerative  disease.  Valve repair was felt feasible using simple ring annuloplasty.  Interrupted 2-0 Ethibond horizontal mattress sutures were placed  circumferentially around the entire mitral annulus for subsequent  annuloplasty.  The valve was sized to accept a 26-mm annuloplasty ring  which corresponds fairly precisely to the dimensions of the anterior  leaflet of the mitral valve.  An Edwards Physio II annuloplasty ring  (model number 5200, serial number [id number]) was secured in place  uneventfully.  After completion of valve repair, the valve was carefully  tested by filling the left ventricle with saline solution.  The valve  appeared to be perfectly competent with no residual regurgitation.   Left ventricular vent was placed across the mitral valve into the left  ventricle.  The majority of the right-sided  lesion set of the Cox  CryoMaze procedure was next completed.  Longitudinal lesions were  created using the cryoprobe along the right lateral wall of the right  atrium extending in a cephalad direction onto the right lateral wall of  the superior vena cava and in the opposite direction to reach the  inferior vena cava.  An oblique lesion was then created across the  lateral wall of the right atrium to reach the acute margin of the heart.  The last lesion was deferred until after the cross-clamp was removed.  The left atriotomy incision was now closed using a two-layer closure of  running 3-0 Prolene suture.  An epicardial pacing wire was fixed to the  epicardial surface of the right ventricle.  The  pericardial space was  drained using a 28-French Bard drain.  One final dose of warm retrograde  hot shot cardioplegia was administered.  The lungs were ventilated and  heart allowed to fill.  The aortic cross-clamp was removed after a total  cross-clamp time of 133 minutes.   The retrograde cardioplegic catheter was removed.  At this juncture, the  IVC cannula was pulled back until the tip was just below the entry of  the right atrium.  A small incision was made in the right atrium.  The  last cryo lesion of the Cox CryoMaze procedure was now completed under  direct vision using the cryoprobe along the endocardial surface of the  right atrium beginning at the tip of the right atrial appendage and  extending down to the anteromedial surface of the tricuspid annulus.  This completed the entire lesion set of the Cox CryoMaze procedure.  The  right atriotomy incision was now closed with a two-layer closure of  running 4-0 Prolene suture.   Atrial pacing wire were now placed on the tip of the right atrial  appendage.  The antegrade cardioplegic cannula was removed.  The left  ventricular vent was removed and the left atriotomy incision closure was  completed.  The patient was rewarmed to 37  degrees centigrade  temperature.  Low-dose dopamine infusion was begun.  The patient was  weaned from cardiopulmonary bypass without difficulty.  The patient's  rhythm at separation from bypass was AV paced.  Total cardiopulmonary  bypass time of the operation was 204 minutes.   Followup transesophageal echocardiogram performed by Dr. Noreene Larsson after  separation from bypass demonstrated a well seated annuloplasty ring in  the mitral position.  The mitral valve was functioning normally and  there was no residual mitral regurgitation.  There was no residual air  appreciated.  Left ventricular function remained preserved.  No other  abnormalities were noted.   Protamine was administered to reverse the anticoagulation.  The right  internal jugular cannula was removed and cannulation site controlled  with a horizontal mattress pledgeted silk suture.  The long femoral  venous cannula was then removed and the femoral vein was repaired with a  pursestring Gore-Tex suture.  The femoral arterial cannula was removed  and the arteriotomy repaired by closing the previously placed  pursestring sutures.  There was an excellent pulse in the distal femoral  artery.   The mediastinum and the right pleural space were inspected for  hemostasis.  The pericardium was reapproximated loosely with silk  sutures.  On-Q continuous pain management system was utilized to  facilitate postoperative pain control.  One 5-inch catheter supplied  with the On-Q kit was tunneled initially through the subcutaneous  tissues and then tunneled into the subpleural space to cover the second  through the sixth intercostal nerve bundles posteriorly.  The right  pleural space was drained with a single 28-French Bard drain placed  through the original port incision inferiorly.  The thoracotomy incision  was closed in layers.  The skin was closed with subcuticular skin  closure.  The right groin incision was closed in layers and  skin  incision closed with subcuticular skin closure.   The patient tolerated the procedure well.  There were no intraoperative  complications.  Following completion of the procedure, the patient was  reintubated using a single-lumen endotracheal tube and a Swan-Ganz  catheter was placed by Dr. Noreene Larsson and the anesthesia team.  The patient was transported to Surgical Intensive  Care Unit in stable condition.  The patient was transfused 2 units of packed red blood cells during  cardiopulmonary bypass due to acute blood loss anemia.  The patient was  transfused one pack of adult platelets and 2 units of fresh frozen  plasma for thrombocytopenia and coagulopathy after reversal of heparin  with protamine.  There were no intraoperative capacious.      Salvatore Decent. Cornelius Moras, M.D.  Electronically Signed     CHO/MEDQ  D:  02/27/2008  T:  02/28/2008  Job:  [id number]   cc:   Nicki Guadalajara, M.D.  Ritta Slot, MD  Antony Madura, M.D.

## 2010-11-10 NOTE — Assessment & Plan Note (Signed)
OFFICE VISIT   DAYLEN, HACK  DOB:  Oct 14, 1928                                        August 12, 2008  CHART #:  [id number]   HISTORY OF PRESENT ILLNESS:  The patient returns to the office today for  routine followup now just slightly less than 6 months following right  thoracotomy for mitral valve repair and Cox Cryo-maze procedure.  She  was last seen here in the office on May 20, 2009.  Since then, she  has continued to do remarkably well from a cardiac standpoint.  A couple  of weeks ago, she was found to have a foreign body in her left foot that  required surgical intervention.  Apparently, this was some sort of a  knitting needle that she must have stepped on it at some point was  unaware of that.  Other than that she has been doing remarkably well.  She states that she was seen in followup by Dr. Tresa Endo a couple of weeks  ago, and she is scheduled to see Dr. Lynnea Ferrier for further followup of her  pacemaker within the next couple of weeks.  Overall, she reports to be  getting along quite well.  She reports no shortness of breath either  with activity or at rest.  She denies any palpitations.  She has not had  any chest discomfort.  The soreness in her right chest has resolved  completely.  She is getting along quite well with only problem being  that related to her left foot and the recent surgery she had.   CURRENT MEDICATIONS:  Amiodarone 100 mg daily, Coumadin as directed,  Lasix 20 mg daily, Lopressor 50 mg every morning and 25 mg every  evening, and Zyrtec as needed.   PHYSICAL EXAMINATION:  GENERAL:  Notable for a well-appearing female.  VITAL SIGNS:  Blood pressure 138/76, pulse is 64 and regular.  A 2-  channel telemetry rhythm strip demonstrates normal sinus rhythm.  Oxygen  saturation is 98% on room air.  CHEST:  Her thoracotomy incision has healed completely.  Auscultation  reveals clear breath sounds, which are symmetrical  bilaterally.  No  wheezes or rhonchi are noted.  CARDIOVASCULAR:  Regular rate and rhythm.  No murmurs, rubs, or gallops  are noted.  ABDOMEN:  Soft, nontender.  EXTREMITIES:  Warm and well perfused.  There is some swelling around  left foot, which is expected related to her recent surgery.  The right  lower extremity has no edema.   IMPRESSION:  Overall, the patient appears to be doing remarkably well.  She appears to be maintaining normal sinus rhythm or atrial paced  rhythm.  She has no heart failure symptoms whatsoever.  She remains on  both amiodarone and Coumadin at this time.   PLAN:  I think it would be reasonable to consider stopping amiodarone if  there are no episodes of atrial fibrillation documented upon  interrogation of her indwelling permanent pacemaker.  Once she has been  off amiodarone for some period of time, then it would even be reasonable  to stop Coumadin if she continues to maintain regular rhythm.  Overall,  she seems to be doing well.  A followup echocardiogram might be  reasonable as well to make sure that her mitral valve repair remains  intact, although there is  no murmur on exam and clinically, she is doing  quite well.  We will plan to see her back in 6 months for followup.   Salvatore Decent. Cornelius Moras, M.D.  Electronically Signed   CHO/MEDQ  D:  08/12/2008  T:  08/12/2008  Job:  [id number]   cc:   Virgina Evener, MD  Ritta Slot, MD  Antony Madura, M.D.

## 2010-11-10 NOTE — H&P (Signed)
HISTORY AND PHYSICAL EXAMINATION   February 05, 2008   Re:  [name], Melissa         DOB:  10-08-1928   Date of planned hospital admission February 27, 2008.   PRIMARY CHIEF COMPLAINT:  Exertional shortness of breath.   HISTORY OF PRESENT ILLNESS:  The patient is a 77 year old female from  [place], [place], who was originally seen in consultation on January 23, 2008, for severe mitral regurgitation and permanent atrial  fibrillation.  She has mild symptoms of exertional shortness of breath.  She has been having long-standing problems with tachy palpitations and  shortness of breath beginning in 2008.  She initially was treated for  recurrent paroxysmal atrial fibrillation, ultimately developing  permanent atrial fibrillation, which failed attempt at DC cardioversion  on 2 separate occasions.  She has recently been found to have severe  mitral regurgitation that has progressed overtime, and she was seen in  consultation on January 23, 2008, at which time, we reviewed  transesophageal echocardiogram and left and right heart catheterization.  Since then, the patient has had no further problems or complaints and  she returns to our office for further followup today.  Please see  consultation report from January 23, 2008, for detailed review of her past  medical history and review of systems.  Her review of systems and  physical exam remained unchanged from that time.   After thinking matters over, the patient is now ready to proceed with  elective surgery.  She underwent CT angiogram of the thoracic aorta  today revealing no sign of significant atherosclerotic disease involving  the ascending aorta, transverse arch, or descending thoracic aorta.  As  such, we believe that she remains potentially a reasonable candidate for  surgical treatment of her underlying mitral valve disease and atrial  fibrillation using minimally invasive techniques.   I have again discussed at  length the indications, risks, and potential  benefits of surgery with the patient and her husband.  The relative  risks and benefits of minimally invasive approach versus conventional  sternotomy has been discussed in detail.  They understand and accept all  potential associated risks of surgery including, but not limited to risk  of death, stroke, myocardial infarction, congestive heart failure,  respiratory failure, pneumonia, bleeding requiring blood transfusion,  arrhythmia, dislodgement of her existing permanent pacemaker, and  recurrent atrial fibrillation.  They understand and accept all potential  risks related to use of minimally invasive approach including the  possibility of recurrent pleural effusion, pain or numbness in the right  lower extremity, possible vascular complication related to blood flow to  the right leg or descending thoracic aorta, possible need for full  thoracotomy and/or conventional sternotomy.  All of their questions have  been addressed.  They understand that we will take a look at her valve  and attempt valve repair feasible.  However, findings from recent  transesophageal echocardiogram suggest that the underlying etiology of  her mitral regurgitation may be related to rheumatic mitral valve  disease, and as such, her valve may need to be replaced.  If valve  replacement is necessary, we will plan to use a bioprosthetic tissue  valve.  She understands that this does come with some small risk of late  structural valve deterioration and failure depending upon her longevity.  However, given her age, this seems to be the most reasonable choice  under the circumstances.  She hopes to someday be able to stop Coumadin  entirely.  We are tentatively planning to proceed with surgery on  Tuesday February 27, 2008.  The patient has been given a prescription  for amiodarone 400 mg p.o. twice daily to begin 1 week prior to surgery  on February 20, 2008.  She will stop  taking Coumadin on Thursday, February 22, 2008, in anticipation of her surgery.  All of her questions have  been addressed.   Salvatore Decent. Cornelius Moras, M.D.  Electronically Signed   CHO/MEDQ  D:  02/05/2008  T:  02/05/2008  Job:  [id number]   cc:   Nicki Guadalajara, M.D.  Ritta Slot, MD  Antony Madura, M.D.

## 2010-11-10 NOTE — Op Note (Signed)
NAMECHELSI, [name]               ACCOUNT NO.:  [id number]   MEDICAL RECORD NO.:  [id number]          PATIENT TYPE:  INP   LOCATION:  3702                         FACILITY:  [facility]   PHYSICIAN:  Darlin Priestly, MD  DATE OF BIRTH:  Sep 16, 1928   DATE OF PROCEDURE:  05/24/2007  DATE OF DISCHARGE:                               OPERATIVE REPORT   PROCEDURE:  Insertion of a Medtronic EnRhythm P1501 DR generator, serial  no. [id number], with active atrial and ventricular leads..   ATTENDING SURGEONS:  1. Darlin Priestly, M.D.  2. Ritta Slot, M.D.   COMPLICATIONS:  None.   INDICATIONS:  [name] is a 77 year old female patient of Dr. Nicki Guadalajara with a history of paroxysmal atrial fibrillation, previously on  Norpace, now on Rythmol 150 t.i.d..  She was admitted on May 19, 2007 with an irregular rapid heart rate and was found to be in rapid  atrial fibrillation.  While hospitalized, she has developed sick sinus  syndrome with up to 3.3-second pauses, with attempts to a lower heart  rate.  She is now referred for pacemaker placement to allow adequate  treatment of her atrial fibrillation and sick sinus syndrome.   DESCRIPTION OF OPERATION:  After obtaining informed consent, the patient  was brought to the cardiac cath lab.  Her left chest was shaved,  prepped, and draped in a sterile fashion.  ECT monitoring was  established.  One percent lidocaine was used to anesthetize the left  midsubclavicular area.  Next, an approximately 3 cm horizontal incision  was then carried out, and hemostasis was obtained with cautery.  Blunt  dissection was used to carry this down to the left pectoral fascia.  Next, an approximately 3-4 cm pocket was then created over the left  pectoral fascia.  Again, hemostasis was obtained with electrocautery.  The left subclavian vein was then entered using the fluoro and contrast  guidance, and a guidewires was retained in the subclavian vein.  At  the  base of the guidewire, four silk sutures were then placed into the  pectoral fascia in order to secure the pacer leads.  Over the 10  guidewire, a #9-French dilator and sheath were inserted over guidewire,  and the guidewire and dilator were removed.  Through this, a 58 cm  active Medtronic ventricular lead, model no. 5076, serial no.  [id number], was then passed into the right atrium.  Guidewire was  retained in the sheath, and peel-away sheath was removed.  Over the  retained guidewire, a second 7-French dilator and sheath were then  passed over the retained guidewire, and the guidewire and dilator were  removed.  Through the second sheath, a 52 cm active Medtronic lead,  model no. 5076, serial [id number], was then passed into the right  atrium, and the peel-away sheath was removed.  A J-curve was then placed  on the ventricle lead stylet, and the ventricular lead was allowed to  prolapse through the tricuspid valve and position in the RV apex.  We  were able to obtain R-waves of 10 mV and capture of  2 volts.  The screw  was then extended, and thresholds were then determined.  R-waves were  measured 10.6 mV.  Impedance 1036 ohms.  Threshold in the ventricle was  0.9 volts at 0.5 milliseconds.  Current was 1.1 mA.  10 volts were  negative diaphragmatic stimulation.  This lead was then sutured into  place with two silk sutures per lead to anchor this to the pectoral  fascia.  The preformed J stylet was then inserted in the atrial lead,  and the atrial lead was positioned in the area of the right atrial  appendage.  The patient was in atrial fibrillation.  Fibrillation waves  were measured anywhere from 0.9-2 mV.  The screw was then extended.  There was excellent injury current noted.  Impedance was 640 ohms in the  atrial lead.  Fibrillation waves were  measured from 0.9-2 mV.  Current  was 1.1 mA.  10 volts were negative for diaphragmatic stimulation.  This  lead was then sutured  into place with two silk sutures to anchor this to  the pectoral fascia.  The pocket was then copiously irrigated with 1%  kanamycin solution.  The leads were then connected in a serial fashion  to a Medtronic EnRhythm, P1501 DR generator, serial no. [id number].  The  head screws were tightened, and pacing was confirmed.  Single silk  suture then placed in the supraaxillary pocket.  The generator leads  were then delivered into the pocket, and the head was screwed to the  silk suture.  Subcutaneous layer was then closed with running 2-0  Vicryl.  The skin was then closed using 4-0 Vicryl.  Steri-Strips were  then applied.  The patient was transferred to the recovery room in  stable condition.   CONCLUSIONS:  Successful implant of a Medtronic EnRhythm P1501 DR  generator, serial no. [id number], with active atrial and ventricular  leads.      Darlin Priestly, MD  Electronically Signed     RHM/MEDQ  D:  05/24/2007  T:  05/25/2007  Job:  [id number]   cc:   Nicki Guadalajara, M.D.

## 2010-11-10 NOTE — Assessment & Plan Note (Signed)
OFFICE VISIT   [name], [name]  DOB:  1929-03-29                                        March 25, 2008  CHART #:  [id number]   HISTORY OF PRESENT ILLNESS:  The patient returns to the office today for  further followup status post right thoracotomy for mitral valve repair  and maze procedure on February 27, 2008.  She was last seen here in the  office on March 13, 2008.  At that time, she underwent right needle  thoracentesis for a right pleural effusion.  Since then, she has  continued to gradually improve.  She has been seen in followup by Dr.  Tresa Endo in the office and her dose of amiodarone was decreased to 200 mg  daily.  Her Coumadin dose has been adjusted through Dr. Landry Dyke office  as well.  Overall, the patient reports that she is slowly feeling  better.  She has had trouble with a postoperative cough, but over the  last several days, this has improved considerably.  She is now sleeping  better at night.  Her strength is improving.  She has no shortness of  breath.  She denies any tachypalpitations.  Her appetite is better.  The  remainder of her review of systems is unremarkable.   PHYSICAL EXAMINATION:  GENERAL:  Notable for a well-appearing female.  VITAL SIGNS:  Blood pressure 136/70, pulse 71 and regular, and oxygen  saturation 98% on room air.  CHEST:  Reveals that all of her surgical incisions are healing nicely.  Breath sounds are clear to auscultation with a few crackles in the right  lung and slightly diminished breath sounds at the right lung base.  No  wheezes or rhonchi are noted.  CARDIOVASCULAR:  Demonstrates regular rate and rhythm.  No murmurs,  rubs, or gallops are noted.  ABDOMEN:  Soft and nontender.  The right groin incision is healing  nicely.  EXTREMITIES:  Warm and well perfused.  Pulses are palpable.  There is no  lower extremity edema.  The remainder of her physical exam is unrevealing.   DIAGNOSTIC TEST:   Chest x-ray obtained today at the [facility] is reviewed and compared with the last film from March 13, 2008, following her thoracentesis.  There has been no significant  interval change between the 2 films.  There remains a small right  pleural effusion as well as some loculated fluid in the fissure.  The  lung fields are otherwise clear.  There is no pulmonary edema.  No other  abnormalities are noted.   IMPRESSION:  Overall, the patient appears to be gradually improving.   PLAN:  I have encouraged the patient to continue to gradually increase  her activity as tolerated without any specific physical limitations at  this point in time.  I have encouraged her to get involved in the  cardiac rehabilitation program.  All of her questions have been  addressed.  We will plan to see her back in 2 months' time with a repeat  chest x-ray.  During the interval, she will call or return sooner should  any further problems or difficulties arise.   Salvatore Decent. Cornelius Moras, M.D.  Electronically Signed   CHO/MEDQ  D:  03/25/2008  T:  03/26/2008  Job:  [id number]   cc:   Maisie Fus  Tresa Endo, M.D.  Antony Madura, M.D.

## 2010-11-10 NOTE — H&P (Signed)
HISTORY AND PHYSICAL EXAMINATION   February 26, 2008   Re:  [name], [name]         DOB:  March 23, 1929   Date of planned hospital admission February 27, 2008.   PRESENTING CHIEF COMPLAINT:  Exertional shortness of breath.   HISTORY OF PRESENT ILLNESS:  The patient is a 77 year old female with  known history of mitral regurgitation and atrial fibrillation who  returns for followup with tentative plans to proceed with surgery on  Tuesday, February 27, 2008, for mitral valve repair or replacement and a  Maze procedure.  She was originally seen in consultation on January 23, 2008.  Please see full consultation report dictated at that time.  During the interval period of time, the patient has developed no new  problems or complaints.  She stopped taking Coumadin on February 22, 2008,  in anticipation of surgery tomorrow.  She has been started on amiodarone  400 mg p.o. twice daily in anticipation of surgery.   PAST MEDICAL HISTORY:  The remainder of her past medical history is  unchanged from previously.   REVIEW OF SYSTEMS:  Unchanged from previously.   CURRENT MEDICATIONS:  Lopressor 50 mg every morning and 25 mg every  evening, ramipril 5 mg twice daily, and amiodarone 400 mg twice daily.   DRUG ALLERGIES:  None known.   PHYSICAL EXAMINATION:  GENERAL:  The patient is a well-appearing female.  VITAL SIGNS:  Blood pressure 141/85, pulse 74 and irregular, and oxygen  saturation 99% on room air.  HEENT:  Unrevealing.  NECK:  Supple.  There is no jugular venous distention.  CHEST:  Auscultation of the chest demonstrates clear breath sounds,  which are symmetrical bilaterally.  No wheezes or rhonchi are noted.  CARDIOVASCULAR:  Irregular heart rhythm.  There is a grade 3/6 systolic  murmur heard best at the apex with radiation to the axilla.  No  diastolic murmurs are noted.  ABDOMEN:  Soft and nontender.  EXTREMITIES:  Warm and well perfused.  There is no lower  extremity  edema.  Femoral pulses are easily palpable bilaterally.  SKIN:  Clean, dry, and healthy appearing throughout.  RECTAL AND GU:  Both deferred.  NEUROLOGIC:  Grossly nonfocal and symmetrical throughout.   DIAGNOSTIC TESTS:  CT angiogram of the thoracic aorta performed on  February 05, 2008, is reviewed.  This demonstrates no significant  aneurysmal dilatation of the thoracic aorta.  There does not appear to  be any significant endoluminal plaque or atherosclerotic plaque, which  might be problematic for retrograde perfusion via femoral artery  cannulation.  The radiologist did question 2 tiny low density lesions in  the pancreas measuring 9 and 10 mm in diameter respectively.  The  radiologist recommended that MRI be performed, but the patient has a  permanent pacemaker.  I have reviewed this CT scan and these  abnormalities are indeed quite small and not very impressive at all.   IMPRESSION:  Severe mitral regurgitation with functional anatomy of the  mitral valve suggestive of underlying rheumatic mitral valve disease.  The patient has permanent atrial fibrillation and an indwelling  permanent pacemaker in place.  We tentatively plan to proceed with  surgery tomorrow for right miniature thoracotomy for mitral valve repair  or replacement.  I am skeptical that repair will be feasible due to the  underlying functional anatomy of the valve.  Assuming the valve  replacement will be necessary, we will plan to use a bioprosthetic  tissue  valve.  We will plan concomitant Maze procedure at the time of  surgery.   With respect to the questionable abnormalities raised by the radiologist  who reviewed the patient's recent chest CT scan, we will plan a followup  CT scan in 6 months.   I have again reviewed the indications, risks, and potential benefits of  surgery with the patient and her husband.  All of their questions have  been addressed.  They desired to proceed with surgery  tomorrow as  scheduled.   Salvatore Decent. Cornelius Moras, M.D.  Electronically Signed   CHO/MEDQ  D:  02/26/2008  T:  02/26/2008  Job:  [id number]   cc:   Antony Madura, MD  Nicki Guadalajara, M.D.  Ritta Slot, MD

## 2010-11-10 NOTE — Assessment & Plan Note (Signed)
OFFICE VISIT   [name], [name]  DOB:  1929/02/23                                        March 13, 2008  CHART #:  [id number]   HISTORY OF PRESENT ILLNESS:  The patient returns for followup status  post right miniature thoracotomy for mitral valve repair and maze  procedure on February 27, 2008.  Her initial procedure was complicated  by postoperative bleeding requiring return to the operating room for re-  exploration and extension of her right thoracotomy incision.  Postoperatively, she otherwise did fairly well, although she did have  some recurrence of postoperative atrial fibrillation.  She was  discharged home last week and returns to the office for followup today.  Overall, the patient reports that she is doing fairly well.  She  complains of a persistent dry nonproductive cough that is new since she  left the hospital.  She has not had shortness of breath.  She has mild  residual soreness beneath her right breast that is appropriate for her  recent thoracotomy.  This does not seem to be slowing her down much and  overall, she is otherwise getting along quite well.  Her appetite is  fairly good.  The remainder of her review of systems is unrevealing.  Medications have not been changed since she left the hospital, and of  note, she has not resumed taking Coumadin yet.   PHYSICAL EXAMINATION:  GENERAL:  Notable for well-appearing female.  VITAL SIGNS:  Blood pressure 121/69 and pulse is 84 and regular.  Two-  channel telemetry rhythm strip demonstrates what appears to be sinus  rhythm.  CHEST:  Her thoracotomy incision is healing nicely.  Her chest tube  sutures removed.  Auscultation of her chest demonstrates diminished  breath sounds at the right lung base.  No wheezes, rales, or rhonchi are  noted.  CARDIOVASCULAR:  Notable for regular rate and rhythm.  No murmurs, rubs,  or gallops are noted.  ABDOMEN:  Soft and nontender.  The right groin  incision is healing  nicely.  EXTREMITIES:  Warm and well perfused.  There is no lower extremity  edema.  Distal pulses are palpable in both legs at the ankle.   DIAGNOSTIC TEST:  Chest x-ray performed today demonstrates a moderate-  sized right pleural effusion, which is new since hospital discharge.  There is associated right middle and right lower lobe atelectasis.  The  lung fields are otherwise fairly clear.   PROCEDURE NOTE:  Following full informed consent, right needle  thoracentesis was performed.  Under sterile conditions, skin and  subcutaneous tissues overlying the right midscapular line posteriorly  was anesthetized with lidocaine and right needle thoracentesis was then  performed.  A total of 825 mL of thin serous blood-tinged fluid was  evacuated uneventfully.  The patient tolerated this quite well.  Portion  of fluid specimen was sent for routine culture and sensitivity.   IMPRESSION:  Postoperative right pleural effusion, now status post  thoracentesis.  Overall, the patient seems to be getting along quite  well.  She appears to be in sinus rhythm.  She has had a persistent dry  nonproductive cough, which could be related to her pleural effusion or  perhaps related to Altace.   PLAN:  I have instructed the patient to stop taking Altace for the time  being.  I have also instructed her to go ahead and resume taking  Coumadin at her previous dose.  We will obtain a chest x-ray today to  followup the pleural effusion and bring her back the week after next to  our office for another routine followup appointment, rhythm check, and  chest x-ray.  She has been instructed to contact Dr. Landry Dyke office to  schedule followup appointment next week.  She will need to have her  prothrombin time checked at that time.  All of her questions have been  addressed.   Salvatore Decent. Cornelius Moras, M.D.  Electronically Signed   CHO/MEDQ  D:  03/13/2008  T:  03/13/2008  Job:  [id number]   cc:    Nicki Guadalajara, M.D.  Antony Madura, M.D.

## 2010-11-12 ENCOUNTER — Encounter: Payer: Self-pay | Admitting: Family Medicine

## 2011-03-31 LAB — BASIC METABOLIC PANEL
BUN: 13
BUN: 13
BUN: 16
BUN: 17
BUN: 22
CO2: 25
CO2: 31
CO2: 34 — ABNORMAL HIGH
CO2: 37 — ABNORMAL HIGH
Calcium: 8.3 — ABNORMAL LOW
Calcium: 8.5
Calcium: 8.7
Calcium: 9
Chloride: 104
Chloride: 109
Chloride: 99
Creatinine, Ser: 1.03
Creatinine, Ser: 1.08
Creatinine, Ser: 1.13
Creatinine, Ser: 1.19
GFR calc Af Amer: 53 — ABNORMAL LOW
GFR calc Af Amer: >60
GFR calc non Af Amer: 52 — ABNORMAL LOW
GFR calc non Af Amer: 54 — ABNORMAL LOW
GFR calc non Af Amer: >60
Glucose, Bld: 115 — ABNORMAL HIGH
Glucose, Bld: 127 — ABNORMAL HIGH
Glucose, Bld: 130 — ABNORMAL HIGH
Glucose, Bld: 155 — ABNORMAL HIGH
Glucose, Bld: 99
Potassium: 3.1 — ABNORMAL LOW
Potassium: 3.4 — ABNORMAL LOW
Potassium: 4
Sodium: 139
Sodium: 140

## 2011-03-31 LAB — PREPARE FRESH FROZEN PLASMA

## 2011-03-31 LAB — DIFFERENTIAL
Basophils Absolute: 0
Basophils Relative: 0
Monocytes Absolute: 0.6
Neutro Abs: 6.2
Neutrophils Relative %: 78 — ABNORMAL HIGH

## 2011-03-31 LAB — CBC
HCT: 32.3 — ABNORMAL LOW
HCT: 33 — ABNORMAL LOW
Hemoglobin: 10.3 — ABNORMAL LOW
Hemoglobin: 10.5 — ABNORMAL LOW
Hemoglobin: 11.9 — ABNORMAL LOW
Hemoglobin: 6.2 — CL
MCHC: 33.2
MCHC: 33.4
MCHC: 34.1
MCHC: 34.1
MCHC: 34.1
MCHC: 34.5
MCHC: 34.5
MCHC: 34.6
MCV: 87.2
MCV: 87.6
MCV: 88.6
MCV: 88.6
Platelets: 107 — ABNORMAL LOW
Platelets: 111 — ABNORMAL LOW
Platelets: 120 — ABNORMAL LOW
Platelets: 68 — ABNORMAL LOW
Platelets: 69 — ABNORMAL LOW
Platelets: 80 — ABNORMAL LOW
Platelets: 88 — ABNORMAL LOW
RBC: 2.06 — ABNORMAL LOW
RBC: 3.48 — ABNORMAL LOW
RDW: 15.5
RDW: 15.7 — ABNORMAL HIGH
RDW: 15.8 — ABNORMAL HIGH
RDW: 15.8 — ABNORMAL HIGH
RDW: 15.9 — ABNORMAL HIGH
RDW: 15.9 — ABNORMAL HIGH
RDW: 16.7 — ABNORMAL HIGH
RDW: 17 — ABNORMAL HIGH
WBC: 3.7 — ABNORMAL LOW
WBC: 5.6
WBC: 8.1

## 2011-03-31 LAB — PREPARE CRYOPRECIPITATE

## 2011-03-31 LAB — POCT I-STAT 4, (NA,K, GLUC, HGB,HCT)
Glucose, Bld: 102 — ABNORMAL HIGH
Glucose, Bld: 107 — ABNORMAL HIGH
Glucose, Bld: 111 — ABNORMAL HIGH
Glucose, Bld: 112 — ABNORMAL HIGH
Glucose, Bld: 119 — ABNORMAL HIGH
Glucose, Bld: 186 — ABNORMAL HIGH
Glucose, Bld: 94
HCT: 21 — ABNORMAL LOW
HCT: 22 — ABNORMAL LOW
HCT: 23 — ABNORMAL LOW
HCT: 35 — ABNORMAL LOW
Hemoglobin: 10.9 — ABNORMAL LOW
Hemoglobin: 11.9 — ABNORMAL LOW
Hemoglobin: 7.1 — CL
Hemoglobin: 7.1 — CL
Hemoglobin: 8.8 — ABNORMAL LOW
Potassium: 3.2 — ABNORMAL LOW
Potassium: 3.6
Potassium: 3.6
Potassium: 4
Potassium: 4.5
Sodium: 135
Sodium: 138
Sodium: 141
Sodium: 142

## 2011-03-31 LAB — POCT I-STAT 3, ART BLOOD GAS (G3+)
Acid-Base Excess: 1
Acid-Base Excess: 1
Acid-base deficit: 1
Acid-base deficit: 4 — ABNORMAL HIGH
Bicarbonate: 21.6
Bicarbonate: 24.7 — ABNORMAL HIGH
Bicarbonate: 25.5 — ABNORMAL HIGH
O2 Saturation: 94
O2 Saturation: 96
O2 Saturation: 97
O2 Saturation: 98
Patient temperature: 34
Patient temperature: 36.3
Patient temperature: 96.4
TCO2: 22
TCO2: 24
TCO2: 24
TCO2: 28
pCO2 arterial: 38.3
pCO2 arterial: 40.7
pCO2 arterial: 55.9 — ABNORMAL HIGH
pH, Arterial: 7.386
pH, Arterial: 7.408 — ABNORMAL HIGH
pH, Arterial: 7.446 — ABNORMAL HIGH
pH, Arterial: 7.45 — ABNORMAL HIGH
pH, Arterial: 7.461 — ABNORMAL HIGH
pO2, Arterial: 85
pO2, Arterial: 88
pO2, Arterial: 88

## 2011-03-31 LAB — PREPARE PLATELET PHERESIS

## 2011-03-31 LAB — MAGNESIUM
Magnesium: 2
Magnesium: 2.2

## 2011-03-31 LAB — PROTIME-INR
INR: 1.3
INR: 1.3
INR: 1.4
INR: 1.5
Prothrombin Time: 16.3 — ABNORMAL HIGH
Prothrombin Time: 17.3 — ABNORMAL HIGH

## 2011-03-31 LAB — GLUCOSE, CAPILLARY
Glucose-Capillary: 117 — ABNORMAL HIGH
Glucose-Capillary: 123 — ABNORMAL HIGH
Glucose-Capillary: 139 — ABNORMAL HIGH
Glucose-Capillary: 141 — ABNORMAL HIGH
Glucose-Capillary: 146 — ABNORMAL HIGH

## 2011-03-31 LAB — APTT
aPTT: 35
aPTT: 36

## 2011-03-31 LAB — POCT I-STAT 3, VENOUS BLOOD GAS (G3P V)
Acid-base deficit: 2
O2 Saturation: 92
TCO2: 24
pCO2, Ven: 33.2 — ABNORMAL LOW

## 2011-03-31 LAB — POTASSIUM: Potassium: 3.8

## 2011-04-02 LAB — I-STAT 8, (EC8 V) (CONVERTED LAB)
Acid-Base Excess: 3 — ABNORMAL HIGH
BUN: 28 — ABNORMAL HIGH
Bicarbonate: 28.4 — ABNORMAL HIGH
Chloride: 107
Glucose, Bld: 98
HCT: 37
Hemoglobin: 12.6
Operator id: 208821
Potassium: 4.3
Sodium: 140
TCO2: 30
pCO2, Ven: 45
pH, Ven: 7.408 — ABNORMAL HIGH

## 2011-04-02 LAB — CBC
HCT: 35.6 — ABNORMAL LOW
Hemoglobin: 11.9 — ABNORMAL LOW
RDW: 14.4
WBC: 6.6

## 2011-04-02 LAB — DIFFERENTIAL
Eosinophils Relative: 1
Lymphocytes Relative: 37
Lymphs Abs: 2.4
Monocytes Absolute: 0.5
Monocytes Relative: 8

## 2011-04-02 LAB — POCT CARDIAC MARKERS
CKMB, poc: 3.3
Myoglobin, poc: 103
Operator id: 208821
Troponin i, poc: <0.05

## 2011-04-02 LAB — POCT I-STAT CREATININE
Creatinine, Ser: 1.2
Operator id: 208821

## 2011-04-02 LAB — B-NATRIURETIC PEPTIDE (CONVERTED LAB): Pro B Natriuretic peptide (BNP): 172 — ABNORMAL HIGH

## 2011-04-06 LAB — PROTIME-INR
INR: 0.9
INR: 1
INR: 1
Prothrombin Time: 12.8
Prothrombin Time: 13.5
Prothrombin Time: 13.5
Prothrombin Time: 13.8
Prothrombin Time: 13.8

## 2011-04-06 LAB — BASIC METABOLIC PANEL
BUN: 13
BUN: 21
BUN: 21
CO2: 29
CO2: 30
Calcium: 9.3
Calcium: 9.7
Calcium: 9.7
Chloride: 105
Creatinine, Ser: 1.08
Creatinine, Ser: 1.11
Creatinine, Ser: 1.13
GFR calc Af Amer: 57 — ABNORMAL LOW
GFR calc non Af Amer: 47 — ABNORMAL LOW
GFR calc non Af Amer: 48 — ABNORMAL LOW
GFR calc non Af Amer: 49 — ABNORMAL LOW
Glucose, Bld: 105 — ABNORMAL HIGH
Glucose, Bld: 108 — ABNORMAL HIGH
Glucose, Bld: 91
Potassium: 3.6
Sodium: 139
Sodium: 139
Sodium: 141

## 2011-04-06 LAB — CBC
HCT: 38.6
HCT: 39.3
HCT: 41.1
Hemoglobin: 12.1
Hemoglobin: 12.9
Hemoglobin: 13.8
Hemoglobin: 14.5
MCHC: 32.6
MCHC: 33.2
MCV: 90.8
MCV: 91.3
MCV: 92.7
Platelets: 224
Platelets: 236
RBC: 4.01
RBC: 4.32
RBC: 4.71
RDW: 14.2
RDW: 14.5
WBC: 5.8
WBC: 6.2
WBC: 6.4
WBC: 6.9
WBC: 9.2

## 2011-04-06 LAB — COMPREHENSIVE METABOLIC PANEL
ALT: 9
AST: 16
BUN: 12
CO2: 26
CO2: 31
Calcium: 7.8 — ABNORMAL LOW
Calcium: 9.7
Chloride: 100
Chloride: 106
Creatinine, Ser: 0.92
GFR calc Af Amer: >60
GFR calc non Af Amer: 11 — ABNORMAL LOW
GFR calc non Af Amer: 60 — ABNORMAL LOW
Glucose, Bld: 107 — ABNORMAL HIGH
Glucose, Bld: 52 — ABNORMAL LOW
Sodium: 138
Total Bilirubin: 0.7
Total Bilirubin: 1.1

## 2011-04-06 LAB — POCT CARDIAC MARKERS
CKMB, poc: 5.3
Myoglobin, poc: 136
Operator id: 198171
Operator id: 270111
Troponin i, poc: <0.05

## 2011-04-06 LAB — CK TOTAL AND CKMB (NOT AT ARMC)
Total CK: 161
Total CK: 191 — ABNORMAL HIGH

## 2011-04-06 LAB — DIFFERENTIAL
Eosinophils Absolute: 0 — ABNORMAL LOW
Eosinophils Relative: 0
Lymphs Abs: 2.2
Monocytes Absolute: 0.5

## 2011-04-06 LAB — APTT: aPTT: 26

## 2011-04-06 LAB — TROPONIN I
Troponin I: 0.02
Troponin I: 0.03

## 2012-03-07 HISTORY — PX: US ECHOCARDIOGRAPHY: HXRAD669

## 2012-10-03 ENCOUNTER — Ambulatory Visit (INDEPENDENT_AMBULATORY_CARE_PROVIDER_SITE_OTHER): Payer: MEDICARE | Admitting: Pharmacist Clinician (PhC)/ Clinical Pharmacy Specialist

## 2012-10-03 DIAGNOSIS — I480 Paroxysmal atrial fibrillation: Secondary | ICD-10-CM | POA: Insufficient documentation

## 2012-10-03 DIAGNOSIS — I4891 Unspecified atrial fibrillation: Secondary | ICD-10-CM

## 2012-10-03 LAB — POCT INR: INR: 1.9

## 2012-10-16 ENCOUNTER — Encounter: Payer: Self-pay | Admitting: *Deleted

## 2012-10-18 ENCOUNTER — Encounter: Payer: Self-pay | Admitting: *Deleted

## 2012-10-26 ENCOUNTER — Encounter: Payer: Self-pay | Admitting: Cardiovascular Disease

## 2012-11-14 ENCOUNTER — Ambulatory Visit (INDEPENDENT_AMBULATORY_CARE_PROVIDER_SITE_OTHER): Payer: MEDICARE | Admitting: Pharmacist Clinician (PhC)/ Clinical Pharmacy Specialist

## 2012-11-14 DIAGNOSIS — I4891 Unspecified atrial fibrillation: Secondary | ICD-10-CM

## 2012-11-14 LAB — POCT INR: INR: 2.3

## 2012-11-17 ENCOUNTER — Encounter: Payer: Self-pay | Admitting: Cardiovascular Disease

## 2012-11-17 ENCOUNTER — Other Ambulatory Visit: Payer: Self-pay | Admitting: Cardiovascular Disease

## 2012-11-17 ENCOUNTER — Ambulatory Visit (INDEPENDENT_AMBULATORY_CARE_PROVIDER_SITE_OTHER): Payer: MEDICARE | Admitting: Cardiovascular Disease

## 2012-11-17 VITALS — BP 114/82 | HR 62 | Resp 16 | Ht 69.0 in | Wt 166.3 lb

## 2012-11-17 DIAGNOSIS — Z9889 Other specified postprocedural states: Secondary | ICD-10-CM

## 2012-11-17 DIAGNOSIS — Z950 Presence of cardiac pacemaker: Secondary | ICD-10-CM

## 2012-11-17 DIAGNOSIS — E785 Hyperlipidemia, unspecified: Secondary | ICD-10-CM

## 2012-11-17 DIAGNOSIS — I4891 Unspecified atrial fibrillation: Secondary | ICD-10-CM

## 2012-11-17 DIAGNOSIS — I495 Sick sinus syndrome: Secondary | ICD-10-CM

## 2012-11-17 LAB — PACEMAKER DEVICE OBSERVATION
AL IMPEDENCE PM: 400 Ohm
BRDY-0003RV: 130 {beats}/min
RV LEAD AMPLITUDE: 10.3 mv
RV LEAD THRESHOLD: 1 V

## 2012-11-17 MED ORDER — TRIAMTERENE-HCTZ 37.5-25 MG PO CAPS: 1.0000 | ORAL_CAPSULE | Freq: Every day | ORAL | Status: DC

## 2012-11-17 NOTE — Progress Notes (Signed)
Pacemaker check in the clinic. Normal device function. No complaints from patient. Will follow remotely for the next year.

## 2012-11-17 NOTE — Assessment & Plan Note (Addendum)
Beta blocker therapy has led to resolution of palpitations. There is roughly 45% atrial pacing and only 2% ventricular pacing.

## 2012-11-17 NOTE — Assessment & Plan Note (Signed)
Although the frequency of atrial fibrillation has increased after discontinuation of the amiodarone the ventricular rates are well controlled in the overall burden of atrial fibrillation is quite low. She is unaware of the arrhythmia after the increased dose of beta blocker. She is on appropriate anticoagulation therapy.

## 2012-11-17 NOTE — Assessment & Plan Note (Signed)
She appears to be statin intolerant. Her muscular pain subsided immediately after discontinuation of atorvastatin. Since she did not have any evidence of coronary disease by previous angiography aggressive therapy for her dyslipidemia does not appear to be justified at this time the

## 2012-11-17 NOTE — Progress Notes (Signed)
Patient ID: [name], female   DOB: July 15, 1928, 79 y.o.   MRN: [id number]  Reason for office visit  Mr. Holcombe returns in followup for paroxysmal atrial fibrillation and sinus node dysfunction status post implantation of a dual-chamber permanent pacemaker. She has a history of mitral valve repair with an annuloplasty ring in 2009. Chest preserved left ventricle systolic function, has not had problems with congestive heart failure and had normal coronary arteries by angiography preoperatively. At the time of surgery she also underwent a Cox cryoablation procedure via a mini thoracotomy. Until recently she was on treatment with amiodarone but this medication has been discontinued.  She does not have much in the way of cardiorespiratory complaints and specifically denies dyspnea palpitations or syncope. She stopped her atorvastatin due to problems with low back pain and cramping in her hands which resolved promptly when she came off this medication.  She is currently in atrial fibrillation and is completely oblivious to the arrhythmia.  Allergies  Allergen Reactions  . Statins Other (See Comments)    Myalgia    Current Outpatient Prescriptions  Medication Sig Dispense Refill  . acetaminophen (TYLENOL) 500 MG tablet Take 500 mg by mouth daily.        Marland Kitchen amLODipine (NORVASC) 2.5 MG tablet Take 2.5 mg by mouth daily.        Marland Kitchen levothyroxine (SYNTHROID, LEVOTHROID) 25 MCG tablet Take 25 mcg by mouth daily.        . metoprolol (LOPRESSOR) 50 MG tablet Take 50 mg by mouth 2 (two) times daily.        Marland Kitchen triamterene-hydrochlorothiazide (DYAZIDE) 37.5-25 MG per capsule Take 1 each (1 capsule total) by mouth daily.  90 capsule  3  . warfarin (COUMADIN) 2.5 MG tablet Take 2.5 mg by mouth as directed.        Marland Kitchen L-Methylfolate-B6-B12 (METANX PO) Take by mouth daily.        Marland Kitchen loratadine (CLARITIN) 10 MG tablet Take 10 mg by mouth daily.         No current facility-administered medications for this  visit.    Past Medical History  Diagnosis Date  . PAF (paroxysmal atrial fibrillation)   . Pacemaker   . Hypothyroidism     2 degree amidarone   . A-fib   . S/P mitral valve repair   . SSS (sick sinus syndrome)   . Systemic hypertension   . Dyslipidemia     Past Surgical History  Procedure Laterality Date  . Mitral valve repair  September 2009    [facility] ring annuloplasty  . Pacemaker placement  November 2008  . Minimally invasive maze procedure  02/2008  . Abdominal hysterectomy  1989  . US echocardiography  03/07/2012    mild LVH,EF =>55%,mild MR,mod TR,trace AI,right atrium mod. dilated  . Nm myoview ltd  03/28/2007    no ischemia  . Cardiac catheterization  01/11/2008    mild-mod pulm. hypertension,normal coronaries    No family history on file.  History   Social History  . Marital Status: Married    Spouse Name: N/A    Number of Children: N/A  . Years of Education: N/A   Occupational History  . Not on file.   Social History Main Topics  . Smoking status: Never Smoker   . Smokeless tobacco: Not on file  . Alcohol Use: No  . Drug Use: Not on file  . Sexually Active: Not on file   Other Topics Concern  . Not  on file   Social History Narrative  . No narrative on file    Review of systems: The patient specifically denies any chest pain at rest exertion, dyspnea at rest or with exertion, orthopnea, paroxysmal nocturnal dyspnea, syncope, palpitations, focal neurological deficits, intermittent claudication, lower extremity edema, unexplained weight gain, cough, hemoptysis or wheezing. She not had problems with bleeding or symptoms that might suggest cardioembolic events.  PHYSICAL EXAM BP 114/82  Pulse 62  Resp 16  Ht 5\' 9"  (1.753 m)  Wt 166 lb 4.8 oz (75.433 kg)  BMI 24.55 kg/m2  General: Alert, oriented x3, no distress Head: no evidence of trauma, PERRL, EOMI, no exophtalmos or lid lag, no myxedema, no xanthelasma; normal ears, nose and  oropharynx Neck: normal jugular venous pulsations and no hepatojugular reflux; brisk carotid pulses without delay and no carotid bruits Chest: clear to auscultation, no signs of consolidation by percussion or palpation, normal fremitus, symmetrical and full respiratory excursions. Healthy left subclavian pacemaker site well healed thoracotomy scar Cardiovascular: normal position and quality of the apical impulse, regular rhythm, normal first and second heart sounds, no murmurs, rubs or gallops Abdomen: no tenderness or distention, no masses by palpation, no abnormal pulsatility or arterial bruits, normal bowel sounds, no hepatosplenomegaly Extremities: no clubbing, cyanosis or edema; 2+ radial, ulnar and brachial pulses bilaterally; 2+ right femoral, posterior tibial and dorsalis pedis pulses; 2+ left femoral, posterior tibial and dorsalis pedis pulses; no subclavian or femoral bruits Neurological: grossly nonfocal   EKG: Atrial paced ventricular sensed rhythm  Device interrogation as reported in a separate note. The presenting rhythm today with atrial fibrillation with controlled ventricular response and intermittent ventricular pacing at about 70 beats per minute. Her device is a [employer]onic Enrhythm and may be subject to early battery depletion. At the current time the battery voltage is 2.96 V with an estimated longevity of 1.5-2 years.  The overall burden of atrial fibrillation is roughly 4.6%. Ventricular rate control is fair during episodes of atrial fibrillation. The longest episode lasted for about 18 hours and the ventricular rate was 87 beats per minute during that spell. There is a noticeable increase in the frequency of atrial fibrillation after discontinuation of amiodarone but the overall burden remains low.  Lead parameters are all within normal range (atrial lead pacing threshold could not be tested secondary to arrhythmia).   Lipid Panel  No results found for this basename: chol,  trig, hdl, cholhdl, vldl, ldlcalc    BMET    Component Value Date/Time   NA 140 03/04/2008 0419   K 3.4* 03/04/2008 0419   CL 99 03/04/2008 0419   CO2 34* 03/04/2008 0419   GLUCOSE 87 03/04/2008 0419   BUN 17 03/04/2008 0419   CREATININE 1.00 03/04/2008 0419   CALCIUM 8.5 03/04/2008 0419   GFRNONAA 54* 03/04/2008 0419   GFRAA  Value: >60        The eGFR has been calculated using the MDRD equation. This calculation has not been validated in all clinical 03/04/2008 0419     ASSESSMENT AND PLAN  Atrial fibrillation Although the frequency of atrial fibrillation has increased after discontinuation of the amiodarone the ventricular rates are well controlled in the overall burden of atrial fibrillation is quite low. She is unaware of the arrhythmia after the increased dose of beta blocker. She is on appropriate anticoagulation therapy.  Tachy-brady syndrome Beta blocker therapy has led to resolution of palpitations. There is roughly 45% atrial pacing and only 2% ventricular pacing.  Pacemaker Her device is potentially susceptible to early battery depletion. I have asked her to be conscientious about doing the balance for pacemaker every 3 months so that we can detect elective replacement indicators soon as possible. No changes are made to the device settings.  S/P mitral valve repair Preserved left ventricle systolic function, no symptoms of dyspnea on exertion. Last echocardiogram in September 2013 showed only mild residual mitral insufficiency.  Hyperlipidemia She appears to be statin intolerant. Her muscular pain subsided immediately after discontinuation of atorvastatin. Since she did not have any evidence of coronary disease by previous angiography aggressive therapy for her dyslipidemia does not appear to be justified at this time the   Follow up Carelink Q3 months/office device check 12 months  Kolbi Tofte  Thurmon Fair, MD, [facility] [phone]  office [phone] pager

## 2012-11-17 NOTE — Assessment & Plan Note (Signed)
Preserved left ventricle systolic function, no symptoms of dyspnea on exertion. Last echocardiogram in September 2013 showed only mild residual mitral insufficiency.

## 2012-11-17 NOTE — Assessment & Plan Note (Signed)
Her device is potentially susceptible to early battery depletion. I have asked her to be conscientious about doing the balance for pacemaker every 3 months so that we can detect elective replacement indicators soon as possible. No changes are made to the device settings.

## 2012-11-17 NOTE — Patient Instructions (Addendum)
Please do a pacemaker remote transmission on 02-19-2013.

## 2012-12-26 ENCOUNTER — Other Ambulatory Visit (INDEPENDENT_AMBULATORY_CARE_PROVIDER_SITE_OTHER): Payer: MEDICARE

## 2012-12-26 ENCOUNTER — Ambulatory Visit: Payer: MEDICARE | Admitting: Pharmacist Clinician (PhC)/ Clinical Pharmacy Specialist

## 2012-12-26 DIAGNOSIS — E785 Hyperlipidemia, unspecified: Secondary | ICD-10-CM

## 2012-12-26 DIAGNOSIS — I4891 Unspecified atrial fibrillation: Secondary | ICD-10-CM

## 2012-12-26 LAB — LIPID PANEL
Cholesterol: 201 mg/dL — ABNORMAL HIGH (ref 0–200)
LDL Cholesterol: 129 mg/dL — ABNORMAL HIGH (ref 0–99)
VLDL: 30 mg/dL (ref 0–40)

## 2013-01-20 ENCOUNTER — Other Ambulatory Visit: Payer: Self-pay | Admitting: Cardiovascular Disease

## 2013-01-20 DIAGNOSIS — I495 Sick sinus syndrome: Secondary | ICD-10-CM

## 2013-01-26 ENCOUNTER — Encounter: Payer: Self-pay | Admitting: *Deleted

## 2013-01-26 LAB — REMOTE PACEMAKER DEVICE
AL AMPLITUDE: 0.4 mv
AL IMPEDENCE PM: 408 Ohm
BATTERY VOLTAGE: 2.96 V
RV LEAD AMPLITUDE: 9.9 mv

## 2013-01-27 ENCOUNTER — Other Ambulatory Visit: Payer: Self-pay | Admitting: Family Medicine

## 2013-01-29 ENCOUNTER — Other Ambulatory Visit: Payer: Self-pay | Admitting: *Deleted

## 2013-01-29 MED ORDER — LEVOTHYROXINE SODIUM 25 MCG PO TABS: 25.0000 ug | ORAL_TABLET | Freq: Every day | ORAL | Status: DC

## 2013-02-06 ENCOUNTER — Ambulatory Visit (INDEPENDENT_AMBULATORY_CARE_PROVIDER_SITE_OTHER): Payer: MEDICARE | Admitting: Pharmacist Clinician (PhC)/ Clinical Pharmacy Specialist

## 2013-02-06 DIAGNOSIS — I4891 Unspecified atrial fibrillation: Secondary | ICD-10-CM

## 2013-03-27 ENCOUNTER — Ambulatory Visit (INDEPENDENT_AMBULATORY_CARE_PROVIDER_SITE_OTHER): Payer: MEDICARE | Admitting: Pharmacist Clinician (PhC)/ Clinical Pharmacy Specialist

## 2013-03-27 DIAGNOSIS — I4891 Unspecified atrial fibrillation: Secondary | ICD-10-CM

## 2013-04-10 ENCOUNTER — Encounter: Payer: Self-pay | Admitting: *Deleted

## 2013-04-11 ENCOUNTER — Other Ambulatory Visit: Payer: Self-pay | Admitting: Cardiovascular Disease

## 2013-04-11 NOTE — Telephone Encounter (Signed)
Rx was sent to pharmacy electronically. 

## 2013-04-23 ENCOUNTER — Ambulatory Visit (INDEPENDENT_AMBULATORY_CARE_PROVIDER_SITE_OTHER): Payer: MEDICARE

## 2013-04-23 DIAGNOSIS — I495 Sick sinus syndrome: Secondary | ICD-10-CM

## 2013-04-23 DIAGNOSIS — I4891 Unspecified atrial fibrillation: Secondary | ICD-10-CM

## 2013-04-23 LAB — PACEMAKER DEVICE OBSERVATION

## 2013-04-24 ENCOUNTER — Ambulatory Visit (INDEPENDENT_AMBULATORY_CARE_PROVIDER_SITE_OTHER): Payer: MEDICARE

## 2013-04-24 DIAGNOSIS — Z23 Encounter for immunization: Secondary | ICD-10-CM

## 2013-04-27 ENCOUNTER — Encounter: Payer: Self-pay | Admitting: *Deleted

## 2013-04-27 LAB — REMOTE PACEMAKER DEVICE
ATRIAL PACING PM: 47.71
BATTERY VOLTAGE: 2.96 V
VENTRICULAR PACING PM: 1.87

## 2013-05-08 ENCOUNTER — Ambulatory Visit (INDEPENDENT_AMBULATORY_CARE_PROVIDER_SITE_OTHER): Payer: MEDICARE | Admitting: Pharmacist Clinician (PhC)/ Clinical Pharmacy Specialist

## 2013-05-08 DIAGNOSIS — I4891 Unspecified atrial fibrillation: Secondary | ICD-10-CM

## 2013-05-09 ENCOUNTER — Other Ambulatory Visit: Payer: Self-pay | Admitting: Cardiovascular Disease

## 2013-05-09 NOTE — Telephone Encounter (Signed)
Rx was sent to pharmacy electronically. 

## 2013-06-12 ENCOUNTER — Ambulatory Visit (INDEPENDENT_AMBULATORY_CARE_PROVIDER_SITE_OTHER): Payer: MEDICARE | Admitting: Pharmacist Clinician (PhC)/ Clinical Pharmacy Specialist

## 2013-06-12 DIAGNOSIS — I4891 Unspecified atrial fibrillation: Secondary | ICD-10-CM

## 2013-07-17 ENCOUNTER — Ambulatory Visit (INDEPENDENT_AMBULATORY_CARE_PROVIDER_SITE_OTHER): Payer: MEDICARE | Admitting: Pharmacist Clinician (PhC)/ Clinical Pharmacy Specialist

## 2013-07-17 DIAGNOSIS — I4891 Unspecified atrial fibrillation: Secondary | ICD-10-CM

## 2013-07-17 LAB — POCT INR: INR: 2

## 2013-07-25 ENCOUNTER — Ambulatory Visit (INDEPENDENT_AMBULATORY_CARE_PROVIDER_SITE_OTHER): Payer: MEDICARE | Admitting: *Deleted

## 2013-07-25 DIAGNOSIS — I4891 Unspecified atrial fibrillation: Secondary | ICD-10-CM

## 2013-07-25 DIAGNOSIS — I495 Sick sinus syndrome: Secondary | ICD-10-CM

## 2013-07-25 LAB — MDC_IDC_ENUM_SESS_TYPE_REMOTE
Battery Voltage: 2.95 V
Brady Statistic AS VP Percent: 2.78 %
Brady Statistic AS VS Percent: 54.62 %
Brady Statistic RV Percent Paced: 3.99 %
Date Time Interrogation Session: 20150128222651
Lead Channel Impedance Value: 432 Ohm
Lead Channel Impedance Value: 560 Ohm
Lead Channel Setting Pacing Amplitude: 2 V
Lead Channel Setting Sensing Sensitivity: 0.9 mV
MDC IDC MSMT LEADCHNL RA SENSING INTR AMPL: 0.44 mV
MDC IDC SET LEADCHNL RA PACING AMPLITUDE: 3 V
MDC IDC SET LEADCHNL RV PACING PULSEWIDTH: 0.4 ms
MDC IDC SET ZONE DETECTION INTERVAL: 400 ms
MDC IDC STAT BRADY AP VP PERCENT: 1.21 %
MDC IDC STAT BRADY AP VS PERCENT: 41.38 %
MDC IDC STAT BRADY RA PERCENT PACED: 42.6 %
Zone Setting Detection Interval: 400 ms

## 2013-07-25 LAB — PACEMAKER DEVICE OBSERVATION

## 2013-08-02 ENCOUNTER — Encounter: Payer: Self-pay | Admitting: *Deleted

## 2013-08-28 ENCOUNTER — Ambulatory Visit (INDEPENDENT_AMBULATORY_CARE_PROVIDER_SITE_OTHER): Payer: MEDICARE | Admitting: Pharmacist Clinician (PhC)/ Clinical Pharmacy Specialist

## 2013-08-28 DIAGNOSIS — I4891 Unspecified atrial fibrillation: Secondary | ICD-10-CM

## 2013-08-28 LAB — POCT INR: INR: 1.8

## 2013-10-09 ENCOUNTER — Ambulatory Visit (INDEPENDENT_AMBULATORY_CARE_PROVIDER_SITE_OTHER): Payer: MEDICARE | Admitting: Pharmacist Clinician (PhC)/ Clinical Pharmacy Specialist

## 2013-10-09 DIAGNOSIS — I4891 Unspecified atrial fibrillation: Secondary | ICD-10-CM

## 2013-10-09 LAB — POCT INR: INR: 1.6

## 2013-10-16 ENCOUNTER — Other Ambulatory Visit: Payer: Self-pay | Admitting: Cardiovascular Disease

## 2013-10-16 NOTE — Telephone Encounter (Signed)
Rx was sent to pharmacy electronically. 

## 2013-10-30 ENCOUNTER — Encounter: Payer: Self-pay | Admitting: Cardiovascular Disease

## 2013-10-30 ENCOUNTER — Ambulatory Visit (INDEPENDENT_AMBULATORY_CARE_PROVIDER_SITE_OTHER): Payer: MEDICARE | Admitting: Cardiovascular Disease

## 2013-10-30 VITALS — BP 120/68 | HR 72 | Resp 16 | Ht 68.0 in | Wt 156.1 lb

## 2013-10-30 DIAGNOSIS — Z950 Presence of cardiac pacemaker: Secondary | ICD-10-CM

## 2013-10-30 DIAGNOSIS — Z9889 Other specified postprocedural states: Secondary | ICD-10-CM

## 2013-10-30 DIAGNOSIS — I4891 Unspecified atrial fibrillation: Secondary | ICD-10-CM

## 2013-10-30 DIAGNOSIS — I495 Sick sinus syndrome: Secondary | ICD-10-CM

## 2013-10-30 LAB — MDC_IDC_ENUM_SESS_TYPE_INCLINIC
Battery Voltage: 2.93 V
Brady Statistic AP VS Percent: 42.4 %
Brady Statistic AS VP Percent: 3.3 %
Brady Statistic AS VS Percent: 53.4 %
Lead Channel Impedance Value: 424 Ohm
Lead Channel Pacing Threshold Amplitude: 1.5 V
Lead Channel Pacing Threshold Pulse Width: 0.7 ms
Lead Channel Sensing Intrinsic Amplitude: 0.6 mV
Lead Channel Setting Pacing Amplitude: 2 V
Lead Channel Setting Pacing Amplitude: 3 V
Lead Channel Setting Sensing Sensitivity: 0.9 mV
MDC IDC MSMT LEADCHNL RA PACING THRESHOLD AMPLITUDE: 1.5 V
MDC IDC MSMT LEADCHNL RA PACING THRESHOLD PULSEWIDTH: 1 ms
MDC IDC MSMT LEADCHNL RV IMPEDANCE VALUE: 424 Ohm
MDC IDC SET LEADCHNL RV PACING PULSEWIDTH: 0.4 ms
MDC IDC SET ZONE DETECTION INTERVAL: 400 ms
MDC IDC STAT BRADY AP VP PERCENT: 1 %
Zone Setting Detection Interval: 400 ms

## 2013-10-30 LAB — PACEMAKER DEVICE OBSERVATION

## 2013-10-30 NOTE — Patient Instructions (Signed)
Remote monitoring is used to monitor your pacemaker from home. This monitoring reduces the number of office visits required to check your device to one time per year. It allows [place] to keep an eye on the functioning of your device to ensure it is working properly. You are scheduled for a device check from home on 01-31-2014. You may send your transmission at any time that day. If you have a wireless device, the transmission will be sent automatically. After your physician reviews your transmission, you will receive a postcard with your next transmission date.  Your physician recommends that you schedule a follow-up appointment in: 12 months with Dr.Croitoru

## 2013-10-31 NOTE — Progress Notes (Signed)
Patient ID: [name], female   DOB: [birth year], 80 y.o.   MRN: [id number]     Reason for office visit PAF, SSS, pacemaker, valvular heart disease  Mr. Mainor returns in followup for paroxysmal atrial fibrillation and sinus node dysfunction status post implantation of a dual-chamber permanent pacemaker. She has a history of mitral valve repair with an annuloplasty ring in 2009. Chest preserved left ventricle systolic function, has not had problems with congestive heart failure and had normal coronary arteries by angiography preoperatively. At the time of surgery she also underwent a Cox cryoablation procedure via a mini thoracotomy. Until recently she was on treatment with amiodarone but this medication has been discontinued.  She continues to have intermittent paroxysmal atrial fibrillation but does not appear to be truly aware of the arrhythmia. Her level of activity does not change and spells of atrial fibrillation. Her overall burden of arrhythmia is 8% and she just came out of a four-day episode of paroxysmal atrial fibrillation. It's hard to say that there was any change in her symptoms. She denies shortness of breath, dizziness, edema, chest pain. Her major complaint is a variety of arthralgia and myalgia and muscle cramps. She has back spasms.     Allergies  Allergen Reactions  . Statins Other (See Comments)    Myalgia    Current Outpatient Prescriptions  Medication Sig Dispense Refill  . acetaminophen (TYLENOL) 500 MG tablet Take 500 mg by mouth daily.        Marland Kitchen amLODipine (NORVASC) 2.5 MG tablet TAKE 1 TABLET DAILY  90 tablet  2  . cyclobenzaprine (FLEXERIL) 10 MG tablet Take 10 mg by mouth 3 (three) times daily as needed for muscle spasms.      Marland Kitchen levothyroxine (SYNTHROID, LEVOTHROID) 25 MCG tablet Take 1 tablet (25 mcg total) by mouth daily.  30 tablet  2  . metoprolol (LOPRESSOR) 50 MG tablet Take 1 tablet (50 mg total) by mouth 2 (two) times daily.  180 tablet  2  .  triamterene-hydrochlorothiazide (DYAZIDE) 37.5-25 MG per capsule TAKE 1 CAPSULE DAILY  90 capsule  0  . warfarin (COUMADIN) 2.5 MG tablet Take 2.5 mg by mouth as directed.         No current facility-administered medications for this visit.    Past Medical History  Diagnosis Date  . PAF (paroxysmal atrial fibrillation)   . Pacemaker   . Hypothyroidism     2 degree amidarone   . A-fib   . S/P mitral valve repair   . SSS (sick sinus syndrome)   . Systemic hypertension   . Dyslipidemia     Past Surgical History  Procedure Laterality Date  . Mitral valve repair  September 2009    [facility] ring annuloplasty  . Pacemaker placement  November 2008  . Minimally invasive maze procedure  02/2008  . Abdominal hysterectomy  1989  . US echocardiography  03/07/2012    mild LVH,EF =>55%,mild MR,mod TR,trace AI,right atrium mod. dilated  . Nm myoview ltd  03/28/2007    no ischemia  . Cardiac catheterization  01/11/2008    mild-mod pulm. hypertension,normal coronaries    No family history on file.  History   Social History  . Marital Status: Married    Spouse Name: N/A    Number of Children: N/A  . Years of Education: N/A   Occupational History  . Not on file.   Social History Main Topics  . Smoking status: Never Smoker   . Smokeless  tobacco: Not on file  . Alcohol Use: No  . Drug Use: Not on file  . Sexual Activity: Not on file   Other Topics Concern  . Not on file   Social History Narrative  . No narrative on file    Review of systems: Cramping in her hands shoulders and legs, back spasms, constant low back pain The patient specifically denies any chest pain at rest or with exertion, dyspnea at rest or with exertion, orthopnea, paroxysmal nocturnal dyspnea, syncope, palpitations, focal neurological deficits, intermittent claudication, lower extremity edema, unexplained weight gain, cough, hemoptysis or wheezing.  The patient also denies abdominal pain, nausea,  vomiting, dysphagia, diarrhea, constipation, polyuria, polydipsia, dysuria, hematuria, frequency, urgency, abnormal bleeding or bruising, fever, chills, unexpected weight changes, mood swings, change in skin or hair texture, change in voice quality, auditory or visual problems, allergic reactions or rashes, new musculoskeletal complaints other than usual "aches and pains".   PHYSICAL EXAM BP 120/68  Pulse 72  Resp 16  Ht '5\' 8"'  (1.727 m)  Wt 156 lb 1.6 oz (70.806 kg)  BMI 23.74 kg/m2 General: Alert, oriented x3, no distress  Head: no evidence of trauma, PERRL, EOMI, no exophtalmos or lid lag, no myxedema, no xanthelasma; normal ears, nose and oropharynx  Neck: normal jugular venous pulsations and no hepatojugular reflux; brisk carotid pulses without delay and no carotid bruits  Chest: clear to auscultation, no signs of consolidation by percussion or palpation, normal fremitus, symmetrical and full respiratory excursions. Healthy left subclavian pacemaker site well healed thoracotomy scar  Cardiovascular: normal position and quality of the apical impulse, regular rhythm, normal first and second heart sounds, no murmurs, rubs or gallops  Abdomen: no tenderness or distention, no masses by palpation, no abnormal pulsatility or arterial bruits, normal bowel sounds, no hepatosplenomegaly  Extremities: no clubbing, cyanosis or edema; 2+ radial, ulnar and brachial pulses bilaterally; 2+ right femoral, posterior tibial and dorsalis pedis pulses; 2+ left femoral, posterior tibial and dorsalis pedis pulses; no subclavian or femoral bruits  Neurological: grossly nonfocal   EKG: Sinus rhythm with PACs and occasional Atrial paced beats,  ventricular sensed rhythm   Lipid Panel     Component Value Date/Time   CHOL 201* 12/26/2012 1057   TRIG 151* 12/26/2012 1057   HDL 42 12/26/2012 1057   CHOLHDL 4.8 12/26/2012 1057   VLDL 30 12/26/2012 1057   LDLCALC 129* 12/26/2012 1057    BMET    Component Value Date/Time     NA 140 03/04/2008 0419   K 3.4* 03/04/2008 0419   CL 99 03/04/2008 0419   CO2 34* 03/04/2008 0419   GLUCOSE 87 03/04/2008 0419   BUN 17 03/04/2008 0419   CREATININE 1.00 03/04/2008 0419   CALCIUM 8.5 03/04/2008 0419   GFRNONAA 54* 03/04/2008 0419   GFRAA  Value: >60        The eGFR has been calculated using the MDRD equation. This calculation has not been validated in all clinical 03/04/2008 0419     ASSESSMENT AND PLAN Atrial fibrillation The burden of atrial fibrillation has increased a little bit since last time (was 4-5%) this is mostly due to the recent episode of 4 days. She appears to remain asymptomatic with the arrhythmia and I still think she is better off not taking any toxic medicine like amiodarone. She should continue taking warfarin without interruption, barring any bleeding or major surgical procedures. Ventricular rate control is excellent during episodes of atrial fibrillation. He has not had stroke or  TIA or other embolic events and has no bleeding complications on warfarin.  Pacemaker Normal device function. Anticipate need for generator change out in the next 12-18 months. Permanent reprogramming changes were made today.  S/P mitral valve repair Last echo evaluation in September 2013 showed a mildly dilated left atrium normal size left ventricle with normal ejection fraction and mild mitral insufficiency. The gradients across the annuloplasty were peak 16, mean 4, pressure halftime 77 ms (heart rate not recorded).   Orders Placed This Encounter  Procedures  . Implantable device check  . EKG 12-Lead   Meds ordered this encounter  Medications  . cyclobenzaprine (FLEXERIL) 10 MG tablet    Sig: Take 10 mg by mouth 3 (three) times daily as needed for muscle spasms.    Aldair Rickel  Sanda Klein, MD, [facility] [phone] office [phone] pager

## 2013-10-31 NOTE — Assessment & Plan Note (Signed)
Last echo evaluation in September 2013 showed a mildly dilated left atrium normal size left ventricle with normal ejection fraction and mild mitral insufficiency. The gradients across the annuloplasty were peak 16, mean 4, pressure halftime 77 ms (heart rate not recorded).

## 2013-10-31 NOTE — Assessment & Plan Note (Addendum)
The burden of atrial fibrillation has increased a little bit since last time (was 4-5%) this is mostly due to the recent episode of 4 days. She appears to remain asymptomatic with the arrhythmia and I still think she is better off not taking any toxic medicine like amiodarone. She should continue taking warfarin without interruption, barring any bleeding or major surgical procedures. Ventricular rate control is excellent during episodes of atrial fibrillation. He has not had stroke or TIA or other embolic events and has no bleeding complications on warfarin.

## 2013-10-31 NOTE — Assessment & Plan Note (Signed)
Normal device function. Anticipate need for generator change out in the next 12-18 months. Permanent reprogramming changes were made today.

## 2013-11-06 ENCOUNTER — Ambulatory Visit (INDEPENDENT_AMBULATORY_CARE_PROVIDER_SITE_OTHER): Payer: MEDICARE | Admitting: Pharmacist Clinician (PhC)/ Clinical Pharmacy Specialist

## 2013-11-06 DIAGNOSIS — I4891 Unspecified atrial fibrillation: Secondary | ICD-10-CM

## 2013-11-06 LAB — POCT INR: INR: 2

## 2013-12-18 ENCOUNTER — Ambulatory Visit (INDEPENDENT_AMBULATORY_CARE_PROVIDER_SITE_OTHER): Payer: MEDICARE | Admitting: Pharmacist Clinician (PhC)/ Clinical Pharmacy Specialist

## 2013-12-18 DIAGNOSIS — I4891 Unspecified atrial fibrillation: Secondary | ICD-10-CM

## 2013-12-18 LAB — POCT INR: INR: 1.9

## 2014-01-01 ENCOUNTER — Telehealth: Payer: Self-pay | Admitting: Pharmacist Clinician (PhC)/ Clinical Pharmacy Specialist

## 2014-01-03 MED ORDER — WARFARIN SODIUM 2.5 MG PO TABS: ORAL_TABLET | ORAL | Status: DC

## 2014-01-03 NOTE — Telephone Encounter (Signed)
done

## 2014-01-04 ENCOUNTER — Other Ambulatory Visit: Payer: Self-pay | Admitting: Cardiovascular Disease

## 2014-01-04 NOTE — Telephone Encounter (Signed)
Rx was sent to pharmacy electronically. 

## 2014-01-08 ENCOUNTER — Telehealth: Payer: Self-pay | Admitting: Pharmacist Clinician (PhC)/ Clinical Pharmacy Specialist

## 2014-01-31 ENCOUNTER — Ambulatory Visit (INDEPENDENT_AMBULATORY_CARE_PROVIDER_SITE_OTHER): Payer: MEDICARE | Admitting: *Deleted

## 2014-01-31 DIAGNOSIS — I495 Sick sinus syndrome: Secondary | ICD-10-CM

## 2014-01-31 NOTE — Progress Notes (Signed)
Remote pacemaker transmission.   

## 2014-02-01 LAB — MDC_IDC_ENUM_SESS_TYPE_REMOTE
Battery Voltage: 2.9 V
Brady Statistic AP VP Percent: 2.24 %
Brady Statistic AS VP Percent: 7.48 %
Brady Statistic AS VS Percent: 56.55 %
Brady Statistic RA Percent Paced: 35.97 %
Brady Statistic RV Percent Paced: 9.73 %
Date Time Interrogation Session: 20150806141255
Lead Channel Impedance Value: 416 Ohm
Lead Channel Setting Pacing Amplitude: 3 V
Lead Channel Setting Sensing Sensitivity: 0.9 mV
MDC IDC MSMT LEADCHNL RV IMPEDANCE VALUE: 456 Ohm
MDC IDC MSMT LEADCHNL RV SENSING INTR AMPL: 10.6342
MDC IDC SET LEADCHNL RV PACING AMPLITUDE: 2 V
MDC IDC SET LEADCHNL RV PACING PULSEWIDTH: 0.4 ms
MDC IDC STAT BRADY AP VS PERCENT: 33.72 %
Zone Setting Detection Interval: 400 ms
Zone Setting Detection Interval: 400 ms

## 2014-02-05 ENCOUNTER — Ambulatory Visit (INDEPENDENT_AMBULATORY_CARE_PROVIDER_SITE_OTHER): Payer: MEDICARE | Admitting: Pharmacist Clinician (PhC)/ Clinical Pharmacy Specialist

## 2014-02-05 DIAGNOSIS — I4891 Unspecified atrial fibrillation: Secondary | ICD-10-CM

## 2014-02-05 LAB — POCT INR: INR: 2.6

## 2014-02-06 ENCOUNTER — Encounter: Payer: Self-pay | Admitting: Cardiology

## 2014-02-13 ENCOUNTER — Encounter: Payer: Self-pay | Admitting: Cardiovascular Disease

## 2014-02-20 ENCOUNTER — Other Ambulatory Visit: Payer: Self-pay | Admitting: Cardiovascular Disease

## 2014-02-21 NOTE — Telephone Encounter (Signed)
Rx was sent to pharmacy electronically. 

## 2014-02-21 NOTE — Telephone Encounter (Signed)
Rx refill sent to patient pharmacy   

## 2014-04-02 ENCOUNTER — Ambulatory Visit (INDEPENDENT_AMBULATORY_CARE_PROVIDER_SITE_OTHER): Payer: MEDICARE | Admitting: Pharmacist Clinician (PhC)/ Clinical Pharmacy Specialist

## 2014-04-02 DIAGNOSIS — I4891 Unspecified atrial fibrillation: Secondary | ICD-10-CM

## 2014-04-02 LAB — POCT INR: INR: 2.9

## 2014-05-01 ENCOUNTER — Ambulatory Visit (INDEPENDENT_AMBULATORY_CARE_PROVIDER_SITE_OTHER): Payer: MEDICARE

## 2014-05-01 ENCOUNTER — Other Ambulatory Visit: Payer: Self-pay | Admitting: *Deleted

## 2014-05-01 DIAGNOSIS — Z23 Encounter for immunization: Secondary | ICD-10-CM

## 2014-05-01 NOTE — Telephone Encounter (Signed)
Pt came in today for flu shot, needs Coumadin refilled to [facility].

## 2014-05-06 ENCOUNTER — Ambulatory Visit (INDEPENDENT_AMBULATORY_CARE_PROVIDER_SITE_OTHER): Payer: MEDICARE | Admitting: *Deleted

## 2014-05-06 ENCOUNTER — Encounter: Payer: Self-pay | Admitting: Cardiovascular Disease

## 2014-05-06 DIAGNOSIS — I495 Sick sinus syndrome: Secondary | ICD-10-CM

## 2014-05-06 LAB — MDC_IDC_ENUM_SESS_TYPE_REMOTE
Battery Voltage: 2.92 V
Brady Statistic AP VP Percent: 2.11 %
Brady Statistic AP VS Percent: 33.93 %
Brady Statistic AS VP Percent: 9.59 %
Brady Statistic AS VS Percent: 54.38 %
Brady Statistic RA Percent Paced: 36.04 %
Lead Channel Impedance Value: 416 Ohm
Lead Channel Sensing Intrinsic Amplitude: 0.176 mV
Lead Channel Setting Pacing Amplitude: 2 V
Lead Channel Setting Pacing Pulse Width: 0.4 ms
Lead Channel Setting Sensing Sensitivity: 0.9 mV
MDC IDC MSMT LEADCHNL RA IMPEDANCE VALUE: 416 Ohm
MDC IDC MSMT LEADCHNL RV SENSING INTR AMPL: 9.6051
MDC IDC SESS DTM: 20151109165635
MDC IDC SET LEADCHNL RA PACING AMPLITUDE: 3 V
MDC IDC SET ZONE DETECTION INTERVAL: 400 ms
MDC IDC STAT BRADY RV PERCENT PACED: 11.69 %
Zone Setting Detection Interval: 400 ms

## 2014-05-06 MED ORDER — WARFARIN SODIUM 2.5 MG PO TABS: ORAL_TABLET | ORAL | Status: DC

## 2014-05-06 NOTE — Progress Notes (Signed)
Remote pacemaker transmission.   

## 2014-05-14 ENCOUNTER — Ambulatory Visit (INDEPENDENT_AMBULATORY_CARE_PROVIDER_SITE_OTHER): Payer: MEDICARE | Admitting: Pharmacist Clinician (PhC)/ Clinical Pharmacy Specialist

## 2014-05-14 DIAGNOSIS — I4891 Unspecified atrial fibrillation: Secondary | ICD-10-CM

## 2014-05-14 LAB — POCT INR: INR: 2.3

## 2014-05-17 ENCOUNTER — Encounter: Payer: Self-pay | Admitting: Cardiology

## 2014-06-25 ENCOUNTER — Ambulatory Visit (INDEPENDENT_AMBULATORY_CARE_PROVIDER_SITE_OTHER): Payer: MEDICARE | Admitting: Pharmacist Clinician (PhC)/ Clinical Pharmacy Specialist

## 2014-06-25 DIAGNOSIS — I4891 Unspecified atrial fibrillation: Secondary | ICD-10-CM

## 2014-06-25 LAB — POCT INR: INR: 2.3

## 2014-07-16 ENCOUNTER — Other Ambulatory Visit: Payer: Self-pay | Admitting: Nurse Practitioner

## 2014-08-06 ENCOUNTER — Ambulatory Visit (INDEPENDENT_AMBULATORY_CARE_PROVIDER_SITE_OTHER): Payer: MEDICARE | Admitting: Pharmacist Clinician (PhC)/ Clinical Pharmacy Specialist

## 2014-08-06 DIAGNOSIS — I4891 Unspecified atrial fibrillation: Secondary | ICD-10-CM | POA: Diagnosis not present

## 2014-08-06 LAB — POCT INR: INR: 2.4

## 2014-08-07 ENCOUNTER — Ambulatory Visit (INDEPENDENT_AMBULATORY_CARE_PROVIDER_SITE_OTHER): Payer: MEDICARE | Admitting: *Deleted

## 2014-08-07 ENCOUNTER — Telehealth: Payer: Self-pay | Admitting: Cardiology

## 2014-08-07 ENCOUNTER — Encounter: Payer: Self-pay | Admitting: Cardiovascular Disease

## 2014-08-07 DIAGNOSIS — I495 Sick sinus syndrome: Secondary | ICD-10-CM

## 2014-08-07 NOTE — Progress Notes (Signed)
Remote pacemaker transmission.   

## 2014-08-07 NOTE — Telephone Encounter (Signed)
Spoke with pt and reminded pt of remote transmission that is due today. Pt verbalized understanding.   

## 2014-08-09 LAB — MDC_IDC_ENUM_SESS_TYPE_REMOTE
Battery Voltage: 2.92 V
Brady Statistic AP VS Percent: 33.31 %
Brady Statistic AS VS Percent: 64.05 %
Brady Statistic RV Percent Paced: 2.64 %
Date Time Interrogation Session: 20160210182551
Lead Channel Impedance Value: 400 Ohm
Lead Channel Impedance Value: 416 Ohm
Lead Channel Sensing Intrinsic Amplitude: 0.264 mV
Lead Channel Sensing Intrinsic Amplitude: 8.9 mV
Lead Channel Setting Pacing Amplitude: 2 V
Lead Channel Setting Pacing Amplitude: 3 V
Lead Channel Setting Pacing Pulse Width: 0.4 ms
Lead Channel Setting Sensing Sensitivity: 0.9 mV
MDC IDC STAT BRADY AP VP PERCENT: 1.32 %
MDC IDC STAT BRADY AS VP PERCENT: 1.32 %
MDC IDC STAT BRADY RA PERCENT PACED: 34.63 %
Zone Setting Detection Interval: 400 ms
Zone Setting Detection Interval: 400 ms

## 2014-08-12 ENCOUNTER — Encounter: Payer: Self-pay | Admitting: Cardiology

## 2014-09-05 ENCOUNTER — Encounter: Payer: Self-pay | Admitting: Family Medicine

## 2014-09-05 ENCOUNTER — Ambulatory Visit (INDEPENDENT_AMBULATORY_CARE_PROVIDER_SITE_OTHER): Payer: MEDICARE | Admitting: Family Medicine

## 2014-09-05 VITALS — BP 119/67 | HR 77 | Temp 97.0°F | Ht 68.0 in | Wt 160.0 lb

## 2014-09-05 DIAGNOSIS — N3090 Cystitis, unspecified without hematuria: Secondary | ICD-10-CM | POA: Diagnosis not present

## 2014-09-05 DIAGNOSIS — R300 Dysuria: Secondary | ICD-10-CM | POA: Diagnosis not present

## 2014-09-05 LAB — POCT URINALYSIS DIPSTICK
BILIRUBIN UA: NEGATIVE
Glucose, UA: NEGATIVE
KETONES UA: NEGATIVE
Nitrite, UA: NEGATIVE
PH UA: 6
Protein, UA: NEGATIVE
SPEC GRAV UA: 1.015
Urobilinogen, UA: NEGATIVE

## 2014-09-05 LAB — POCT UA - MICROSCOPIC ONLY
Bacteria, U Microscopic: NEGATIVE
Casts, Ur, LPF, POC: NEGATIVE
Crystals, Ur, HPF, POC: NEGATIVE
YEAST UA: NEGATIVE

## 2014-09-05 MED ORDER — CIPROFLOXACIN HCL 250 MG PO TABS: 250.0000 mg | ORAL_TABLET | Freq: Two times a day (BID) | ORAL | Status: DC

## 2014-09-05 NOTE — Progress Notes (Signed)
Subjective:  Patient ID: [name], female    DOB: 17-Jul-1928  Age: 81 y.o. MRN: [id number]  CC: Abdominal Pain   HPI [name] presents for lower abdominal pain of 3 days' duration moderate in severity. It is associated with frequency and dysuria. There is no urgency.  History [name] has a past medical history of PAF (paroxysmal atrial fibrillation); Pacemaker; Hypothyroidism; A-fib; S/P mitral valve repair; SSS (sick sinus syndrome); Systemic hypertension; and Dyslipidemia.   She has past surgical history that includes Mitral valve repair (September 2009); pacemaker placement (November 2008); Minimally invasive maze procedure (02/2008); Abdominal hysterectomy (1989); US echocardiography (03/07/2012); [employer] myoview ltd (03/28/2007); and Cardiac catheterization (01/11/2008).   Her family history is not on file.She reports that she has never smoked. She does not have any smokeless tobacco history on file. She reports that she does not drink alcohol. Her drug history is not on file.  Current Outpatient Prescriptions on File Prior to Visit  Medication Sig Dispense Refill  . acetaminophen (TYLENOL) 500 MG tablet Take 500 mg by mouth daily.      Marland Kitchen amLODipine (NORVASC) 2.5 MG tablet Take 1 tablet by mouth once daily.  90 tablet 3  . cyclobenzaprine (FLEXERIL) 10 MG tablet Take 10 mg by mouth 3 (three) times daily as needed for muscle spasms.    Marland Kitchen levothyroxine (SYNTHROID, LEVOTHROID) 25 MCG tablet Take 1 tablet (25 mcg total) by mouth daily. 30 tablet 2  . metoprolol (LOPRESSOR) 50 MG tablet Take 1 tablet (50 mg total) by mouth 2 (two) times daily. 180 tablet 2  . triamterene-hydrochlorothiazide (DYAZIDE) 37.5-25 MG per capsule Take 1 each (1 capsule total) by mouth daily. 90 capsule 2  . warfarin (COUMADIN) 2.5 MG tablet TAKE 1 TO 2 TABLETS DAILY AS DIRECTED 180 tablet 0   No current facility-administered medications on file prior to visit.    ROS Review of Systems  Constitutional:  Negative for fever, chills, diaphoresis, appetite change, fatigue and unexpected weight change.  HENT: Negative for congestion, ear pain, hearing loss, postnasal drip, rhinorrhea, sneezing, sore throat and trouble swallowing.   Eyes: Negative for pain.  Respiratory: Negative for cough, chest tightness and shortness of breath.   Cardiovascular: Negative for chest pain and palpitations.  Gastrointestinal: Positive for abdominal pain. Negative for nausea, vomiting, diarrhea, constipation, blood in stool and abdominal distention.  Genitourinary: Negative for dysuria, frequency and menstrual problem.  Musculoskeletal: Negative for joint swelling and arthralgias.  Skin: Negative for rash.  Neurological: Negative for dizziness, weakness, numbness and headaches.  Psychiatric/Behavioral: Negative for dysphoric mood and agitation.    Objective:  BP 119/67 mmHg  Pulse 77  Temp(Src) 97 F (36.1 C) (Oral)  Ht 5\' 8"  (1.727 m)  Wt 160 lb (72.576 kg)  BMI 24.33 kg/m2  BP Readings from Last 3 Encounters:  09/05/14 119/67  10/30/13 120/68  11/17/12 114/82    Wt Readings from Last 3 Encounters:  09/05/14 160 lb (72.576 kg)  10/30/13 156 lb 1.6 oz (70.806 kg)  11/17/12 166 lb 4.8 oz (75.433 kg)     Physical Exam  Constitutional: She is oriented to person, place, and time. She appears well-developed and well-nourished. No distress.  HENT:  Head: Normocephalic and atraumatic.  Left Ear: External ear normal.  Nose: Nose normal.  Mouth/Throat: Oropharynx is clear and moist.  Eyes: Pupils are equal, round, and reactive to light.  Neck: Normal range of motion. Neck supple. No thyromegaly present.  Cardiovascular: Normal rate, regular rhythm and  normal heart sounds.   No murmur heard. Pulmonary/Chest: Effort normal and breath sounds normal. No respiratory distress. She has no wheezes. She has no rales.  Abdominal: Soft. Bowel sounds are normal. She exhibits no distension. There is no tenderness.    Lymphadenopathy:    She has no cervical adenopathy.  Neurological: She is alert and oriented to person, place, and time. She has normal reflexes.  Skin: Skin is warm and dry.  Psychiatric: She has a normal mood and affect. Her behavior is normal. Judgment and thought content normal.    No results found for: HGBA1C  Lab Results  Component Value Date   WBC 5.6 03/04/2008   HGB 10.8* 03/04/2008   HCT 32.3* 03/04/2008   PLT 120* 03/04/2008   GLUCOSE 87 03/04/2008   CHOL 201* 12/26/2012   TRIG 151* 12/26/2012   HDL 42 12/26/2012   LDLCALC 129* 12/26/2012   ALT  05/23/2007    9 QUESTIONABLE RESULTS, RECOMMEND RECOLLECT TO VERIFY   AST  05/23/2007    16 QUESTIONABLE RESULTS, RECOMMEND RECOLLECT TO VERIFY   NA 140 03/04/2008   K 3.4* 03/04/2008   CL 99 03/04/2008   CREATININE 1.00 03/04/2008   BUN 17 03/04/2008   CO2 34* 03/04/2008   INR 2.4 08/06/2014   Results for orders placed or performed in visit on 09/05/14  Urine culture  Result Value Ref Range   Urine Culture, Routine Final report (A)    Result 1 Escherichia coli (A)    ANTIMICROBIAL SUSCEPTIBILITY Comment   POCT urinalysis dipstick  Result Value Ref Range   Color, UA yellow    Clarity, UA cloudy    Glucose, UA neg    Bilirubin, UA neg    Ketones, UA neg    Spec Grav, UA 1.015    Blood, UA large    pH, UA 6.0    Protein, UA neg    Urobilinogen, UA negative    Nitrite, UA neg    Leukocytes, UA large (3+)   POCT UA - Microscopic Only  Result Value Ref Range   WBC, Ur, HPF, POC 30-40    RBC, urine, microscopic 10-15    Bacteria, U Microscopic neg    Mucus, UA occ    Epithelial cells, urine per micros occ    Crystals, Ur, HPF, POC neg    Casts, Ur, LPF, POC neg    Yeast, UA neg     No results found.  Assessment & Plan:   [name] was seen today for abdominal pain.  Diagnoses and all orders for this visit:  Dysuria Orders: -     POCT urinalysis dipstick -     POCT UA - Microscopic Only -     Urine  culture  Cystitis  Other orders -     ciprofloxacin (CIPRO) 250 MG tablet; Take 1 tablet (250 mg total) by mouth 2 (two) times daily.     Meds ordered this encounter  Medications  . ciprofloxacin (CIPRO) 250 MG tablet    Sig: Take 1 tablet (250 mg total) by mouth 2 (two) times daily.    Dispense:  10 tablet    Refill:  0     Follow-up: No Follow-up on file.  Claretta Fraise, M.D.

## 2014-09-08 LAB — URINE CULTURE

## 2014-09-17 ENCOUNTER — Ambulatory Visit (INDEPENDENT_AMBULATORY_CARE_PROVIDER_SITE_OTHER): Payer: MEDICARE | Admitting: Pharmacist Clinician (PhC)/ Clinical Pharmacy Specialist

## 2014-09-17 DIAGNOSIS — I4891 Unspecified atrial fibrillation: Secondary | ICD-10-CM

## 2014-09-17 LAB — POCT INR: INR: 1.9

## 2014-10-24 ENCOUNTER — Ambulatory Visit (INDEPENDENT_AMBULATORY_CARE_PROVIDER_SITE_OTHER): Payer: MEDICARE | Admitting: Pharmacist

## 2014-10-24 DIAGNOSIS — I4891 Unspecified atrial fibrillation: Secondary | ICD-10-CM

## 2014-10-24 LAB — POCT INR: INR: 2.5

## 2014-10-24 NOTE — Patient Instructions (Signed)
Anticoagulation Dose Instructions as of 10/24/2014      [name] Tue Wed Thu Fri Sat   New Dose 2.5 mg 2.5 mg 5 mg 2.5 mg 5 mg 2.5 mg 5 mg    Description        Continue same dose.

## 2014-10-29 ENCOUNTER — Other Ambulatory Visit: Payer: Self-pay | Admitting: Cardiovascular Disease

## 2014-11-26 ENCOUNTER — Ambulatory Visit (INDEPENDENT_AMBULATORY_CARE_PROVIDER_SITE_OTHER): Payer: MEDICARE | Admitting: Cardiovascular Disease

## 2014-11-26 ENCOUNTER — Encounter: Payer: Self-pay | Admitting: Cardiovascular Disease

## 2014-11-26 VITALS — BP 120/70 | HR 64 | Ht 68.0 in | Wt 162.7 lb

## 2014-11-26 DIAGNOSIS — I480 Paroxysmal atrial fibrillation: Secondary | ICD-10-CM

## 2014-11-26 DIAGNOSIS — Z9889 Other specified postprocedural states: Secondary | ICD-10-CM

## 2014-11-26 DIAGNOSIS — I495 Sick sinus syndrome: Secondary | ICD-10-CM | POA: Diagnosis not present

## 2014-11-26 DIAGNOSIS — E785 Hyperlipidemia, unspecified: Secondary | ICD-10-CM

## 2014-11-26 DIAGNOSIS — Z950 Presence of cardiac pacemaker: Secondary | ICD-10-CM

## 2014-11-26 LAB — CUP PACEART INCLINIC DEVICE CHECK
Battery Voltage: 2.89 V
Brady Statistic AP VP Percent: 1.1 %
Brady Statistic AP VS Percent: 38.2 %
Brady Statistic AS VS Percent: 58.7 %
Lead Channel Setting Pacing Amplitude: 2 V
Lead Channel Setting Pacing Amplitude: 3 V
Lead Channel Setting Sensing Sensitivity: 0.9 mV
MDC IDC MSMT LEADCHNL RA IMPEDANCE VALUE: 416 Ohm
MDC IDC MSMT LEADCHNL RA SENSING INTR AMPL: 0.2 mV
MDC IDC MSMT LEADCHNL RV IMPEDANCE VALUE: 432 Ohm
MDC IDC MSMT LEADCHNL RV SENSING INTR AMPL: 9.3 mV
MDC IDC SESS DTM: 20160531133619
MDC IDC SET LEADCHNL RV PACING PULSEWIDTH: 0.4 ms
MDC IDC STAT BRADY AS VP PERCENT: 2 %
Zone Setting Detection Interval: 400 ms
Zone Setting Detection Interval: 400 ms

## 2014-11-26 NOTE — Patient Instructions (Addendum)
Dr Sallyanne Kuster has recommended making the following medication changes: STOP Amlodipine  Remote monitoring is used to monitor your pacemaker from home. This monitoring reduces the number of office visits required to check your device to one time per year. It allows [place] to keep an eye on the functioning of your device to ensure it is working properly. You are scheduled for a device check from home on 12/26/2014. You may send your transmission at any time that day. If you have a wireless device, the transmission will be sent automatically. After your physician reviews your transmission, you will receive a postcard with your next transmission date.  Dr Sallyanne Kuster recommends that you schedule a follow-up appointment in 12 months with pacer check. You will receive a reminder letter in the mail two months in advance. If you don't receive a letter, please call our office to schedule the follow-up appointment.

## 2014-11-26 NOTE — Progress Notes (Signed)
Patient ID: [name], female   DOB: Mar 22, 1929, 81 y.o.   MRN: [id number]     Cardiology Office Note   Date:  11/26/2014   ID:  [name], DOB Sep 30, 1928, MRN [id number]  PCP:  Myriam Jacobson, MD  Cardiologist:   Sanda Klein, MD   Chief Complaint  Patient presents with  . Annual Exam    some bilateral leg swelling. would like to try to get off some of her medications      History of Present Illness: [name] is a 81 y.o. female who presents for pacemaker follow-up. She has a history of sinus node dysfunction and paroxysmal atrial fibrillation and received a dual-chamber permanent pacemaker in 2008 ([employer]tr[employer] EnRhythm). She also has a history of essential hypertension. She is on chronic warfarin anticoagulation and has not had any bleeding complications. She does not have a history of stroke/TIA or other embolic events. She generally feels well. She is never aware of palpitations. She would like to simplify her medications. Her only complaint is occasional mild puffiness of her ankles. At home her blood pressures consistently normal, sometimes low (systolic blood pressure in the 90s).  She is in atrial fibrillation today and is completely oblivious to the arrhythmia.  She has a history of mitral valve repair with an annuloplasty ring in 2009. Chest preserved left ventricle systolic function, has not had problems with congestive heart failure and had normal coronary arteries by angiography preoperatively. At the time of surgery she also underwent a Cox cryoablation procedure via a mini thoracotomy. Until last year she was on treatment with amiodarone but this medication has been discontinued.  Pacemaker interrogation shows normal device function. Generator voltage is 2.89 V (ERI 2.81 V) and generator replacement is anticipated to be necessary this year. There is 40% atrial pacing and only 3% ventricular pacing. The burden of atrial fibrillation is only 5%.  Antitachycardia pacing for atrial fibrillation has been operational, but never seems to be effective and was turned off today.    Past Medical History  Diagnosis Date  . PAF (paroxysmal atrial fibrillation)   . Pacemaker   . Hypothyroidism     2 degree amidarone   . A-fib   . S/P mitral valve repair   . SSS (sick sinus syndrome)   . Systemic hypertension   . Dyslipidemia     Past Surgical History  Procedure Laterality Date  . Mitral valve repair  September 2009    [facility] ring annuloplasty  . Pacemaker placement  November 2008  . Minimally invasive maze procedure  02/2008  . Abdominal hysterectomy  1989  . US echocardiography  03/07/2012    mild LVH,EF =>55%,mild MR,mod TR,trace AI,right atrium mod. dilated  . Nm myoview ltd  03/28/2007    no ischemia  . Cardiac catheterization  01/11/2008    mild-mod pulm. hypertension,normal coronaries     Current Outpatient Prescriptions  Medication Sig Dispense Refill  . acetaminophen (TYLENOL) 500 MG tablet Take 500 mg by mouth daily.      . cyclobenzaprine (FLEXERIL) 10 MG tablet Take 10 mg by mouth 3 (three) times daily as needed for muscle spasms.    Marland Kitchen levothyroxine (SYNTHROID, LEVOTHROID) 25 MCG tablet Take 1 tablet (25 mcg total) by mouth daily. 30 tablet 2  . metoprolol (LOPRESSOR) 50 MG tablet TAKE 1 TABLET TWICE A DAY 180 tablet 0  . triamterene-hydrochlorothiazide (DYAZIDE) 37.5-25 MG per capsule TAKE 1 CAPSULE DAILY 90 capsule 0  . warfarin (COUMADIN)  2.5 MG tablet TAKE 1 TO 2 TABLETS DAILY AS DIRECTED 180 tablet 0   No current facility-administered medications for this visit.    Allergies:   Statins    Social History:  The patient  reports that she has never smoked. She does not have any smokeless tobacco history on file. She reports that she does not drink alcohol.     ROS:  Please see the history of present illness.    Otherwise, review of systems positive for a variety of joint related aches and pains.   All  other systems are reviewed and negative.    PHYSICAL EXAM: VS:  BP 120/70 mmHg  Pulse 64  Ht 5\' 8"  (1.727 m)  Wt 73.8 kg (162 lb 11.2 oz)  BMI 24.74 kg/m2 , BMI Body mass index is 24.74 kg/(m^2).  General: Alert, oriented x3, no distress Head: no evidence of trauma, PERRL, EOMI, no exophtalmos or lid lag, no myxedema, no xanthelasma; normal ears, nose and oropharynx Neck: normal jugular venous pulsations and no hepatojugular reflux; brisk carotid pulses without delay and no carotid bruits Chest: clear to auscultation, no signs of consolidation by percussion or palpation, normal fremitus, symmetrical and full respiratory excursions, healthy subclavian pacemaker site Cardiovascular: normal position and quality of the apical impulse, irregular rhythm, normal first and second heart sounds, no murmurs, rubs or gallops Abdomen: no tenderness or distention, no masses by palpation, no abnormal pulsatility or arterial bruits, normal bowel sounds, no hepatosplenomegaly Extremities: no clubbing, cyanosis or edema; 2+ radial, ulnar and brachial pulses bilaterally; 2+ right femoral, posterior tibial and dorsalis pedis pulses; 2+ left femoral, posterior tibial and dorsalis pedis pulses; no subclavian or femoral bruits Neurological: grossly nonfocal Psych: euthymic mood, full affect   EKG:  EKG is ordered today. The ekg ordered today demonstrates atrial fibrillation with intermittent ventricular pacing   Recent Labs: No results found for requested labs within last 365 days.    Lipid Panel    Component Value Date/Time   CHOL 201* 12/26/2012 1057   TRIG 151* 12/26/2012 1057   HDL 42 12/26/2012 1057   CHOLHDL 4.8 12/26/2012 1057   VLDL 30 12/26/2012 1057   LDLCALC 129* 12/26/2012 1057      Wt Readings from Last 3 Encounters:  11/26/14 73.8 kg (162 lb 11.2 oz)  09/05/14 72.576 kg (160 lb)  10/30/13 70.806 kg (156 lb 1.6 oz)      ASSESSMENT AND PLAN:  1.  Recurrent paroxysmal atrial  fibrillation, asymptomatic, on appropriate anticoagulation and good ventricular rate control. Antiarrhythmics are not justified. Discussed the potential benefits of direct oral anticoagulates over warfarin but at this point she is not interested in switching. Her prothrombin time is generally in the therapeutic range on warfarin.  2.  Symptomatic sinus bradycardia with normal dual-chamber permanent pacemaker function. Her particular generator is known to have issues with sudden battery depletion and monthly checks will begin starting today. Anticipate need for generator change this year.  3. Systemic hypertension - the pressures very well controlled and she has some ankle swelling. Will stop her low-dose amlodipine and continue the beta blocker which is necessary for atrial fibrillation ventricular rate control as well as her diaphoretic.     Current medicines are reviewed at length with the patient today.  The patient has concerns regarding medicines.  The following changes have been made:  Stop amlodipine   Labs/ tests ordered today include:  Orders Placed This Encounter  Procedures  . Implantable device check  .  EKG 12-Lead    Patient Instructions  Dr Sallyanne Kuster has recommended making the following medication changes: STOP Amlodipine  Remote monitoring is used to monitor your pacemaker from home. This monitoring reduces the number of office visits required to check your device to one time per year. It allows [place] to keep an eye on the functioning of your device to ensure it is working properly. You are scheduled for a device check from home on 12/26/2014. You may send your transmission at any time that day. If you have a wireless device, the transmission will be sent automatically. After your physician reviews your transmission, you will receive a postcard with your next transmission date.  Dr Sallyanne Kuster recommends that you schedule a follow-up appointment in 12 months with pacer check. You  will receive a reminder letter in the mail two months in advance. If you don't receive a letter, please call our office to schedule the follow-up appointment.    Mikael Spray, MD  11/26/2014 4:54 PM    Sanda Klein, MD, [facility] [phone] office [phone] pager

## 2014-12-03 ENCOUNTER — Ambulatory Visit (INDEPENDENT_AMBULATORY_CARE_PROVIDER_SITE_OTHER): Payer: MEDICARE | Admitting: Pharmacist Clinician (PhC)/ Clinical Pharmacy Specialist

## 2014-12-03 DIAGNOSIS — I482 Chronic atrial fibrillation, unspecified: Secondary | ICD-10-CM

## 2014-12-03 DIAGNOSIS — I4891 Unspecified atrial fibrillation: Secondary | ICD-10-CM

## 2014-12-03 LAB — POCT INR: INR: 1.8

## 2014-12-03 MED ORDER — WARFARIN SODIUM 2.5 MG PO TABS: ORAL_TABLET | ORAL | Status: DC

## 2014-12-10 ENCOUNTER — Encounter: Payer: Self-pay | Admitting: Cardiovascular Disease

## 2014-12-26 ENCOUNTER — Telehealth: Payer: Self-pay | Admitting: Cardiology

## 2014-12-26 ENCOUNTER — Encounter: Payer: Self-pay | Admitting: Cardiovascular Disease

## 2014-12-26 ENCOUNTER — Ambulatory Visit (INDEPENDENT_AMBULATORY_CARE_PROVIDER_SITE_OTHER): Payer: MEDICARE | Admitting: *Deleted

## 2014-12-26 ENCOUNTER — Telehealth: Payer: Self-pay | Admitting: Cardiovascular Disease

## 2014-12-26 DIAGNOSIS — Z950 Presence of cardiac pacemaker: Secondary | ICD-10-CM

## 2014-12-26 LAB — CUP PACEART REMOTE DEVICE CHECK
Battery Voltage: 2.9 V
Brady Statistic AP VP Percent: 1.14 %
Brady Statistic AS VP Percent: 1.6 %
Brady Statistic AS VS Percent: 55.13 %
Date Time Interrogation Session: 20160630201455
Lead Channel Impedance Value: 392 Ohm
Lead Channel Impedance Value: 416 Ohm
Lead Channel Sensing Intrinsic Amplitude: 0.176 mV
Lead Channel Sensing Intrinsic Amplitude: 8.233 mV
Lead Channel Setting Pacing Amplitude: 2 V
Lead Channel Setting Pacing Amplitude: 3 V
Lead Channel Setting Pacing Pulse Width: 0.4 ms
MDC IDC SET LEADCHNL RV SENSING SENSITIVITY: 0.9 mV
MDC IDC SET ZONE DETECTION INTERVAL: 400 ms
MDC IDC SET ZONE DETECTION INTERVAL: 400 ms
MDC IDC STAT BRADY AP VS PERCENT: 42.14 %
MDC IDC STAT BRADY RA PERCENT PACED: 43.27 %
MDC IDC STAT BRADY RV PERCENT PACED: 2.74 %

## 2014-12-26 NOTE — Progress Notes (Signed)
Remote pacemaker transmission.   

## 2014-12-26 NOTE — Telephone Encounter (Signed)
LMOVM reminding pt to send remote transmission.   

## 2014-12-26 NOTE — Telephone Encounter (Signed)
Spoke w/ pt and attempted to help her trouble shoot monitor. After unsuccessful attempts informed pt to call tech services. Pt verbalized understanding.

## 2014-12-26 NOTE — Telephone Encounter (Signed)
Follow Up    Pt is returning your call. Please call.

## 2015-01-13 ENCOUNTER — Encounter: Payer: Self-pay | Admitting: Cardiology

## 2015-01-14 ENCOUNTER — Ambulatory Visit (INDEPENDENT_AMBULATORY_CARE_PROVIDER_SITE_OTHER): Payer: MEDICARE | Admitting: Pharmacist Clinician (PhC)/ Clinical Pharmacy Specialist

## 2015-01-14 DIAGNOSIS — I4891 Unspecified atrial fibrillation: Secondary | ICD-10-CM | POA: Diagnosis not present

## 2015-01-14 DIAGNOSIS — I482 Chronic atrial fibrillation, unspecified: Secondary | ICD-10-CM

## 2015-01-14 LAB — POCT INR: INR: 2.1

## 2015-02-19 ENCOUNTER — Telehealth: Payer: Self-pay | Admitting: Cardiovascular Disease

## 2015-02-19 MED ORDER — METOPROLOL TARTRATE 50 MG PO TABS: 50.0000 mg | ORAL_TABLET | Freq: Two times a day (BID) | ORAL | Status: DC

## 2015-02-19 MED ORDER — TRIAMTERENE-HCTZ 37.5-25 MG PO CAPS: 1.0000 | ORAL_CAPSULE | Freq: Every day | ORAL | Status: DC

## 2015-02-19 NOTE — Telephone Encounter (Signed)
°  1. Which medications need to be refilled? Trimterene HCTZ- Dr C and Metoprolol- Dr Claiborne Billings  2. Which pharmacy is medication to be sent to? [facility]  3. Do they need a 30 day or 90 day supply? 90 and refills  4. Would they like a call back once the medication has been sent to the pharmacy? no

## 2015-02-19 NOTE — Telephone Encounter (Signed)
Rx(s) sent to pharmacy electronically.  

## 2015-02-24 ENCOUNTER — Ambulatory Visit (INDEPENDENT_AMBULATORY_CARE_PROVIDER_SITE_OTHER): Payer: MEDICARE | Admitting: *Deleted

## 2015-02-24 DIAGNOSIS — Z45010 Encounter for checking and testing of cardiac pacemaker pulse generator [battery]: Secondary | ICD-10-CM

## 2015-02-24 NOTE — Progress Notes (Signed)
PPM battery check only.

## 2015-02-25 ENCOUNTER — Encounter: Payer: MEDICARE | Admitting: Pharmacist Clinician (PhC)/ Clinical Pharmacy Specialist

## 2015-02-26 LAB — CUP PACEART REMOTE DEVICE CHECK
Brady Statistic AP VP Percent: 1.46 %
Brady Statistic AS VP Percent: 2.88 %
Brady Statistic AS VS Percent: 56.01 %
Brady Statistic RA Percent Paced: 41.11 %
Date Time Interrogation Session: 20160829131604
Lead Channel Impedance Value: 416 Ohm
Lead Channel Sensing Intrinsic Amplitude: 0.264 mV
Lead Channel Setting Pacing Amplitude: 2 V
Lead Channel Setting Pacing Amplitude: 3 V
Lead Channel Setting Pacing Pulse Width: 0.4 ms
MDC IDC MSMT BATTERY VOLTAGE: 2.89 V
MDC IDC MSMT LEADCHNL RA IMPEDANCE VALUE: 424 Ohm
MDC IDC MSMT LEADCHNL RV SENSING INTR AMPL: 9.605 mV
MDC IDC SET LEADCHNL RV SENSING SENSITIVITY: 0.9 mV
MDC IDC STAT BRADY AP VS PERCENT: 39.65 %
MDC IDC STAT BRADY RV PERCENT PACED: 4.34 %
Zone Setting Detection Interval: 400 ms
Zone Setting Detection Interval: 400 ms

## 2015-02-27 ENCOUNTER — Ambulatory Visit (INDEPENDENT_AMBULATORY_CARE_PROVIDER_SITE_OTHER): Payer: MEDICARE | Admitting: Pharmacist

## 2015-02-27 DIAGNOSIS — I4891 Unspecified atrial fibrillation: Secondary | ICD-10-CM

## 2015-02-27 DIAGNOSIS — I482 Chronic atrial fibrillation, unspecified: Secondary | ICD-10-CM

## 2015-02-27 LAB — POCT INR: INR: 2.3

## 2015-02-27 NOTE — Patient Instructions (Signed)
Anticoagulation Dose Instructions as of 02/27/2015      [name] Tue Wed Thu Fri Sat   New Dose 2.5 mg 2.5 mg 5 mg 2.5 mg 5 mg 2.5 mg 5 mg    Description        Continue same warfarin dose of 2.5mg  - take 2 tablets on tuesdays, thursdays and saturdays.  Take 1 tablet all other days.     INR was 2.3 today

## 2015-03-14 ENCOUNTER — Encounter: Payer: Self-pay | Admitting: Cardiology

## 2015-03-21 ENCOUNTER — Encounter: Payer: Self-pay | Admitting: Cardiovascular Disease

## 2015-04-08 ENCOUNTER — Ambulatory Visit (INDEPENDENT_AMBULATORY_CARE_PROVIDER_SITE_OTHER): Payer: MEDICARE | Admitting: Pharmacist Clinician (PhC)/ Clinical Pharmacy Specialist

## 2015-04-08 DIAGNOSIS — I482 Chronic atrial fibrillation, unspecified: Secondary | ICD-10-CM

## 2015-04-08 DIAGNOSIS — Z23 Encounter for immunization: Secondary | ICD-10-CM

## 2015-04-08 DIAGNOSIS — I4891 Unspecified atrial fibrillation: Secondary | ICD-10-CM | POA: Diagnosis not present

## 2015-04-08 LAB — POCT INR: INR: 3.1

## 2015-04-24 ENCOUNTER — Ambulatory Visit (INDEPENDENT_AMBULATORY_CARE_PROVIDER_SITE_OTHER): Payer: MEDICARE | Admitting: *Deleted

## 2015-04-24 DIAGNOSIS — I495 Sick sinus syndrome: Secondary | ICD-10-CM | POA: Diagnosis not present

## 2015-04-24 NOTE — Progress Notes (Signed)
Remote pacemaker transmission.   

## 2015-05-06 LAB — CUP PACEART REMOTE DEVICE CHECK
Battery Voltage: 2.89 V
Brady Statistic AP VP Percent: 1.99 %
Brady Statistic AP VS Percent: 31.06 %
Brady Statistic RV Percent Paced: 6.27 %
Date Time Interrogation Session: 20161027125139
Implantable Lead Implant Date: 20081126
Implantable Lead Location: 753859
Implantable Lead Model: 5076
Lead Channel Impedance Value: 424 Ohm
Lead Channel Setting Pacing Amplitude: 3 V
Lead Channel Setting Pacing Pulse Width: 0.4 ms
MDC IDC LEAD IMPLANT DT: 20081126
MDC IDC LEAD LOCATION: 753860
MDC IDC MSMT LEADCHNL RA SENSING INTR AMPL: 0.176 mV
MDC IDC MSMT LEADCHNL RV IMPEDANCE VALUE: 392 Ohm
MDC IDC MSMT LEADCHNL RV SENSING INTR AMPL: 8.576 mV
MDC IDC SET LEADCHNL RV PACING AMPLITUDE: 2 V
MDC IDC SET LEADCHNL RV SENSING SENSITIVITY: 0.9 mV
MDC IDC STAT BRADY AS VP PERCENT: 4.28 %
MDC IDC STAT BRADY AS VS PERCENT: 62.67 %
MDC IDC STAT BRADY RA PERCENT PACED: 33.04 %

## 2015-05-07 ENCOUNTER — Encounter: Payer: Self-pay | Admitting: Cardiology

## 2015-05-19 DIAGNOSIS — R5383 Other fatigue: Secondary | ICD-10-CM | POA: Diagnosis not present

## 2015-05-19 DIAGNOSIS — R42 Dizziness and giddiness: Secondary | ICD-10-CM | POA: Diagnosis not present

## 2015-05-19 DIAGNOSIS — R002 Palpitations: Secondary | ICD-10-CM | POA: Diagnosis not present

## 2015-05-19 DIAGNOSIS — R1032 Left lower quadrant pain: Secondary | ICD-10-CM | POA: Diagnosis not present

## 2015-05-19 DIAGNOSIS — Z131 Encounter for screening for diabetes mellitus: Secondary | ICD-10-CM | POA: Diagnosis not present

## 2015-05-19 DIAGNOSIS — Z0000 Encounter for general adult medical examination without abnormal findings: Secondary | ICD-10-CM | POA: Diagnosis not present

## 2015-05-19 DIAGNOSIS — E7800 Pure hypercholesterolemia, unspecified: Secondary | ICD-10-CM | POA: Diagnosis not present

## 2015-05-19 DIAGNOSIS — Z79899 Other long term (current) drug therapy: Secondary | ICD-10-CM | POA: Diagnosis not present

## 2015-05-19 DIAGNOSIS — H8309 Labyrinthitis, unspecified ear: Secondary | ICD-10-CM | POA: Diagnosis not present

## 2015-05-19 DIAGNOSIS — Z1389 Encounter for screening for other disorder: Secondary | ICD-10-CM | POA: Diagnosis not present

## 2015-05-19 DIAGNOSIS — I70219 Atherosclerosis of native arteries of extremities with intermittent claudication, unspecified extremity: Secondary | ICD-10-CM | POA: Diagnosis not present

## 2015-05-19 DIAGNOSIS — H9113 Presbycusis, bilateral: Secondary | ICD-10-CM | POA: Diagnosis not present

## 2015-05-26 ENCOUNTER — Ambulatory Visit (INDEPENDENT_AMBULATORY_CARE_PROVIDER_SITE_OTHER): Payer: MEDICARE | Admitting: *Deleted

## 2015-05-26 DIAGNOSIS — Z45010 Encounter for checking and testing of cardiac pacemaker pulse generator [battery]: Secondary | ICD-10-CM

## 2015-05-27 ENCOUNTER — Ambulatory Visit (INDEPENDENT_AMBULATORY_CARE_PROVIDER_SITE_OTHER): Payer: MEDICARE | Admitting: Pharmacist Clinician (PhC)/ Clinical Pharmacy Specialist

## 2015-05-27 DIAGNOSIS — I482 Chronic atrial fibrillation, unspecified: Secondary | ICD-10-CM

## 2015-05-27 DIAGNOSIS — I4891 Unspecified atrial fibrillation: Secondary | ICD-10-CM

## 2015-05-27 LAB — POCT INR: INR: 2.6

## 2015-05-27 NOTE — Progress Notes (Signed)
Remote pacemaker transmission.   

## 2015-06-05 LAB — CUP PACEART REMOTE DEVICE CHECK
Battery Voltage: 2.89 V
Brady Statistic AP VP Percent: 2.7 %
Brady Statistic AS VS Percent: 61.27 %
Brady Statistic RV Percent Paced: 7.38 %
Date Time Interrogation Session: 20161128151113
Implantable Lead Implant Date: 20081126
Implantable Lead Location: 753859
Implantable Lead Model: 5076
Lead Channel Impedance Value: 440 Ohm
Lead Channel Sensing Intrinsic Amplitude: 0.22 mV
Lead Channel Setting Pacing Amplitude: 3 V
Lead Channel Setting Pacing Pulse Width: 0.4 ms
MDC IDC LEAD IMPLANT DT: 20081126
MDC IDC LEAD LOCATION: 753860
MDC IDC MSMT LEADCHNL RV IMPEDANCE VALUE: 408 Ohm
MDC IDC SET LEADCHNL RV PACING AMPLITUDE: 2 V
MDC IDC SET LEADCHNL RV SENSING SENSITIVITY: 0.9 mV
MDC IDC STAT BRADY AP VS PERCENT: 31.36 %
MDC IDC STAT BRADY AS VP PERCENT: 4.67 %
MDC IDC STAT BRADY RA PERCENT PACED: 34.06 %

## 2015-06-06 ENCOUNTER — Encounter: Payer: Self-pay | Admitting: Cardiology

## 2015-06-26 ENCOUNTER — Telehealth: Payer: Self-pay | Admitting: Cardiology

## 2015-06-26 ENCOUNTER — Ambulatory Visit (INDEPENDENT_AMBULATORY_CARE_PROVIDER_SITE_OTHER): Payer: MEDICARE | Admitting: *Deleted

## 2015-06-26 DIAGNOSIS — I495 Sick sinus syndrome: Secondary | ICD-10-CM

## 2015-06-26 NOTE — Telephone Encounter (Signed)
LMOVM reminding pt to send remote transmission.   

## 2015-06-26 NOTE — Progress Notes (Signed)
Remote pacemaker transmission.   

## 2015-07-08 ENCOUNTER — Ambulatory Visit (INDEPENDENT_AMBULATORY_CARE_PROVIDER_SITE_OTHER): Payer: MEDICARE | Admitting: Pharmacist Clinician (PhC)/ Clinical Pharmacy Specialist

## 2015-07-08 DIAGNOSIS — I4891 Unspecified atrial fibrillation: Secondary | ICD-10-CM | POA: Diagnosis not present

## 2015-07-08 DIAGNOSIS — I482 Chronic atrial fibrillation, unspecified: Secondary | ICD-10-CM

## 2015-07-08 LAB — POCT INR: INR: 2.1

## 2015-07-15 LAB — CUP PACEART REMOTE DEVICE CHECK
Battery Voltage: 2.88 V
Brady Statistic AS VS Percent: 59.09 %
Brady Statistic RA Percent Paced: 39.66 %
Date Time Interrogation Session: 20161229204240
Implantable Lead Implant Date: 20081126
Implantable Lead Location: 753860
Implantable Lead Model: 5076
Lead Channel Impedance Value: 408 Ohm
Lead Channel Sensing Intrinsic Amplitude: 0.264 mV
Lead Channel Sensing Intrinsic Amplitude: 9.605 mV
Lead Channel Setting Pacing Amplitude: 2 V
Lead Channel Setting Pacing Pulse Width: 0.4 ms
Lead Channel Setting Sensing Sensitivity: 0.9 mV
MDC IDC LEAD IMPLANT DT: 20081126
MDC IDC LEAD LOCATION: 753859
MDC IDC MSMT LEADCHNL RA IMPEDANCE VALUE: 424 Ohm
MDC IDC SET LEADCHNL RA PACING AMPLITUDE: 3 V
MDC IDC STAT BRADY AP VP PERCENT: 2.93 %
MDC IDC STAT BRADY AP VS PERCENT: 36.73 %
MDC IDC STAT BRADY AS VP PERCENT: 1.25 %
MDC IDC STAT BRADY RV PERCENT PACED: 4.18 %

## 2015-07-16 ENCOUNTER — Encounter: Payer: Self-pay | Admitting: Cardiology

## 2015-07-28 ENCOUNTER — Ambulatory Visit (INDEPENDENT_AMBULATORY_CARE_PROVIDER_SITE_OTHER): Payer: MEDICARE | Admitting: *Deleted

## 2015-07-28 DIAGNOSIS — Z45010 Encounter for checking and testing of cardiac pacemaker pulse generator [battery]: Secondary | ICD-10-CM

## 2015-07-28 NOTE — Progress Notes (Signed)
Remote pacemaker transmission.   

## 2015-08-05 LAB — CUP PACEART REMOTE DEVICE CHECK
Brady Statistic AP VS Percent: 26.73 %
Brady Statistic AS VP Percent: 6.55 %
Brady Statistic AS VS Percent: 63.04 %
Brady Statistic RV Percent Paced: 10.22 %
Implantable Lead Implant Date: 20081126
Implantable Lead Model: 5076
Lead Channel Impedance Value: 448 Ohm
Lead Channel Sensing Intrinsic Amplitude: 0.352 mV
Lead Channel Setting Pacing Amplitude: 2 V
Lead Channel Setting Sensing Sensitivity: 0.9 mV
MDC IDC LEAD IMPLANT DT: 20081126
MDC IDC LEAD LOCATION: 753859
MDC IDC LEAD LOCATION: 753860
MDC IDC MSMT BATTERY VOLTAGE: 2.86 V
MDC IDC MSMT LEADCHNL RA IMPEDANCE VALUE: 440 Ohm
MDC IDC SESS DTM: 20170130160736
MDC IDC SET LEADCHNL RA PACING AMPLITUDE: 3 V
MDC IDC SET LEADCHNL RV PACING PULSEWIDTH: 0.4 ms
MDC IDC STAT BRADY AP VP PERCENT: 3.67 %
MDC IDC STAT BRADY RA PERCENT PACED: 30.41 %

## 2015-08-06 ENCOUNTER — Encounter: Payer: Self-pay | Admitting: Cardiology

## 2015-08-08 ENCOUNTER — Encounter: Payer: Self-pay | Admitting: Cardiology

## 2015-08-19 ENCOUNTER — Ambulatory Visit (INDEPENDENT_AMBULATORY_CARE_PROVIDER_SITE_OTHER): Payer: MEDICARE | Admitting: Pharmacist Clinician (PhC)/ Clinical Pharmacy Specialist

## 2015-08-19 ENCOUNTER — Other Ambulatory Visit: Payer: Self-pay | Admitting: Cardiovascular Disease

## 2015-08-19 DIAGNOSIS — I482 Chronic atrial fibrillation, unspecified: Secondary | ICD-10-CM

## 2015-08-19 DIAGNOSIS — I4891 Unspecified atrial fibrillation: Secondary | ICD-10-CM

## 2015-08-19 LAB — POCT INR: INR: 2.2

## 2015-08-19 NOTE — Telephone Encounter (Signed)
Rx request sent to pharmacy.  

## 2015-08-28 ENCOUNTER — Telehealth: Payer: Self-pay | Admitting: Cardiology

## 2015-08-28 ENCOUNTER — Ambulatory Visit (INDEPENDENT_AMBULATORY_CARE_PROVIDER_SITE_OTHER): Payer: MEDICARE | Admitting: *Deleted

## 2015-08-28 DIAGNOSIS — I495 Sick sinus syndrome: Secondary | ICD-10-CM | POA: Diagnosis not present

## 2015-08-28 NOTE — Telephone Encounter (Signed)
LMOVM reminding pt to send remote transmission.   

## 2015-08-29 ENCOUNTER — Telehealth: Payer: Self-pay | Admitting: Cardiovascular Disease

## 2015-08-29 ENCOUNTER — Encounter: Payer: Self-pay | Admitting: Cardiology

## 2015-08-29 NOTE — Telephone Encounter (Signed)
Informed pt that her transmission was received. Pt verbalized understanding.  

## 2015-08-29 NOTE — Progress Notes (Signed)
Remote pacemaker transmission.   

## 2015-08-29 NOTE — Telephone Encounter (Signed)
New message ° ° ° ° ° °Calling to see if you got her remote transmission °

## 2015-08-29 NOTE — Telephone Encounter (Signed)
Spoke w/ pt and informed her that we did not receive her remote transmission. Pt hooked up monitor and attempted to send transmission again.

## 2015-09-05 LAB — CUP PACEART REMOTE DEVICE CHECK
Brady Statistic AP VP Percent: 3.16 %
Brady Statistic AP VS Percent: 31.91 %
Brady Statistic AS VS Percent: 61.74 %
Implantable Lead Implant Date: 20081126
Implantable Lead Location: 753859
Implantable Lead Location: 753860
Lead Channel Impedance Value: 424 Ohm
Lead Channel Sensing Intrinsic Amplitude: 0.264 mV
Lead Channel Setting Pacing Pulse Width: 0.4 ms
MDC IDC LEAD IMPLANT DT: 20081126
MDC IDC MSMT BATTERY VOLTAGE: 2.86 V
MDC IDC MSMT LEADCHNL RV IMPEDANCE VALUE: 400 Ohm
MDC IDC MSMT LEADCHNL RV SENSING INTR AMPL: 9.948 mV
MDC IDC SESS DTM: 20170303200546
MDC IDC SET LEADCHNL RA PACING AMPLITUDE: 3 V
MDC IDC SET LEADCHNL RV PACING AMPLITUDE: 2 V
MDC IDC SET LEADCHNL RV SENSING SENSITIVITY: 0.9 mV
MDC IDC STAT BRADY AS VP PERCENT: 3.19 %
MDC IDC STAT BRADY RA PERCENT PACED: 35.07 %
MDC IDC STAT BRADY RV PERCENT PACED: 6.35 %

## 2015-09-05 NOTE — Progress Notes (Signed)
Normal remote reviewed. 3.2% AF burden, stable, on Warfarin, V rates controlled  Next [facility] 09/29/15 for battery check

## 2015-09-10 ENCOUNTER — Encounter: Payer: Self-pay | Admitting: Cardiology

## 2015-09-11 ENCOUNTER — Telehealth: Payer: Self-pay | Admitting: Cardiovascular Disease

## 2015-09-11 NOTE — Telephone Encounter (Signed)
LMOM that transmission was received and next transmission is scheduled for 09/29/15.

## 2015-09-11 NOTE — Telephone Encounter (Signed)
New message ° ° ° ° ° °1. Has your device fired? no °2. Is you device beeping? no ° °3. Are you experiencing draining or swelling at device site? no ° °4. Are you calling to see if we received your device transmission?yes °5. Have you passed out? no °

## 2015-09-29 ENCOUNTER — Ambulatory Visit (INDEPENDENT_AMBULATORY_CARE_PROVIDER_SITE_OTHER): Payer: MEDICARE | Admitting: *Deleted

## 2015-09-29 ENCOUNTER — Telehealth: Payer: Self-pay | Admitting: Cardiology

## 2015-09-29 DIAGNOSIS — Z45010 Encounter for checking and testing of cardiac pacemaker pulse generator [battery]: Secondary | ICD-10-CM

## 2015-09-29 NOTE — Telephone Encounter (Signed)
Spoke with pt and reminded pt of remote transmission that is due today. Pt verbalized understanding.   

## 2015-09-29 NOTE — Progress Notes (Signed)
Remote pacemaker transmission.   

## 2015-09-30 ENCOUNTER — Ambulatory Visit (INDEPENDENT_AMBULATORY_CARE_PROVIDER_SITE_OTHER): Payer: MEDICARE | Admitting: Pharmacist Clinician (PhC)/ Clinical Pharmacy Specialist

## 2015-09-30 ENCOUNTER — Encounter (INDEPENDENT_AMBULATORY_CARE_PROVIDER_SITE_OTHER): Payer: Self-pay

## 2015-09-30 DIAGNOSIS — I482 Chronic atrial fibrillation, unspecified: Secondary | ICD-10-CM

## 2015-09-30 LAB — COAGUCHEK XS/INR WAIVED
INR: 2.6 — ABNORMAL HIGH (ref 0.9–1.1)
PROTHROMBIN TIME: 31.6 s

## 2015-10-29 ENCOUNTER — Other Ambulatory Visit: Payer: Self-pay | Admitting: Cardiovascular Disease

## 2015-10-30 ENCOUNTER — Ambulatory Visit (INDEPENDENT_AMBULATORY_CARE_PROVIDER_SITE_OTHER): Payer: MEDICARE | Admitting: *Deleted

## 2015-10-30 DIAGNOSIS — Z45010 Encounter for checking and testing of cardiac pacemaker pulse generator [battery]: Secondary | ICD-10-CM

## 2015-10-30 NOTE — Telephone Encounter (Signed)
Rx(s) sent to pharmacy electronically.  

## 2015-10-30 NOTE — Progress Notes (Signed)
Remote pacemaker transmission.   

## 2015-11-18 ENCOUNTER — Ambulatory Visit (INDEPENDENT_AMBULATORY_CARE_PROVIDER_SITE_OTHER): Payer: MEDICARE | Admitting: Pharmacist Clinician (PhC)/ Clinical Pharmacy Specialist

## 2015-11-18 DIAGNOSIS — I482 Chronic atrial fibrillation, unspecified: Secondary | ICD-10-CM

## 2015-11-18 LAB — COAGUCHEK XS/INR WAIVED
INR: 2.9 — ABNORMAL HIGH (ref 0.9–1.1)
Prothrombin Time: 34.8 s

## 2015-11-18 NOTE — Patient Instructions (Signed)
Anticoagulation Dose Instructions as of 11/18/2015      Dorene Grebe Tue Wed Thu Fri Sat   New Dose 2.5 mg 2.5 mg 5 mg 2.5 mg 5 mg 2.5 mg 5 mg    Description        Continue taking the same way.  INR today 2.9

## 2015-11-22 LAB — CUP PACEART REMOTE DEVICE CHECK
Battery Voltage: 2.86 V
Brady Statistic AP VP Percent: 3.15 %
Brady Statistic AP VS Percent: 33.81 %
Brady Statistic AS VP Percent: 4.45 %
Brady Statistic AS VS Percent: 58.6 %
Brady Statistic RA Percent Paced: 36.96 %
Brady Statistic RV Percent Paced: 7.59 %
Date Time Interrogation Session: 20170403154817
Implantable Lead Implant Date: 20081126
Implantable Lead Implant Date: 20081126
Implantable Lead Location: 753859
Implantable Lead Location: 753860
Implantable Lead Model: 5076
Implantable Lead Model: 5076
Lead Channel Impedance Value: 408 Ohm
Lead Channel Impedance Value: 440 Ohm
Lead Channel Sensing Intrinsic Amplitude: 0.308 mV
Lead Channel Sensing Intrinsic Amplitude: 9.605 mV
Lead Channel Setting Pacing Amplitude: 2 V
Lead Channel Setting Pacing Amplitude: 3 V
Lead Channel Setting Pacing Pulse Width: 0.4 ms
Lead Channel Setting Sensing Sensitivity: 0.9 mV

## 2015-11-22 NOTE — Progress Notes (Signed)
Normal pacemaker remote Battery check only Next check 10-30-15

## 2015-11-28 ENCOUNTER — Encounter: Payer: Self-pay | Admitting: Cardiology

## 2015-12-03 ENCOUNTER — Encounter: Payer: Self-pay | Admitting: Cardiology

## 2015-12-09 LAB — CUP PACEART REMOTE DEVICE CHECK
Battery Voltage: 2.85 V
Brady Statistic AP VP Percent: 2.19 %
Brady Statistic AS VP Percent: 3.74 %
Brady Statistic RA Percent Paced: 32.94 %
Brady Statistic RV Percent Paced: 5.93 %
Date Time Interrogation Session: 20170504140655
Implantable Lead Implant Date: 20081126
Implantable Lead Location: 753860
Implantable Lead Model: 5076
Lead Channel Setting Pacing Amplitude: 3 V
Lead Channel Setting Pacing Pulse Width: 0.4 ms
Lead Channel Setting Sensing Sensitivity: 0.9 mV
MDC IDC LEAD IMPLANT DT: 20081126
MDC IDC LEAD LOCATION: 753859
MDC IDC MSMT LEADCHNL RA IMPEDANCE VALUE: 432 Ohm
MDC IDC MSMT LEADCHNL RA SENSING INTR AMPL: 0.176 mV
MDC IDC MSMT LEADCHNL RV IMPEDANCE VALUE: 432 Ohm
MDC IDC SET LEADCHNL RV PACING AMPLITUDE: 2 V
MDC IDC STAT BRADY AP VS PERCENT: 30.75 %
MDC IDC STAT BRADY AS VS PERCENT: 63.32 %

## 2016-01-02 ENCOUNTER — Ambulatory Visit (INDEPENDENT_AMBULATORY_CARE_PROVIDER_SITE_OTHER): Payer: MEDICARE | Admitting: Pharmacist Clinician (PhC)/ Clinical Pharmacy Specialist

## 2016-01-02 DIAGNOSIS — I482 Chronic atrial fibrillation, unspecified: Secondary | ICD-10-CM

## 2016-01-02 LAB — COAGUCHEK XS/INR WAIVED
INR: 3.2 — ABNORMAL HIGH (ref 0.9–1.1)
PROTHROMBIN TIME: 37.8 s

## 2016-01-02 NOTE — Patient Instructions (Signed)
Anticoagulation Dose Instructions as of 01/02/2016      Dorene Grebe Tue Wed Thu Fri Sat   New Dose 2.5 mg 2.5 mg 5 mg 2.5 mg 5 mg 2.5 mg 2.5 mg    Description        Change to taking 1 tablet a day except on Tuesdays and Thursdays take 2 tablets.  INR today 3.2

## 2016-02-03 ENCOUNTER — Encounter (INDEPENDENT_AMBULATORY_CARE_PROVIDER_SITE_OTHER): Payer: Self-pay

## 2016-02-03 ENCOUNTER — Ambulatory Visit (INDEPENDENT_AMBULATORY_CARE_PROVIDER_SITE_OTHER): Payer: MEDICARE | Admitting: Cardiovascular Disease

## 2016-02-03 ENCOUNTER — Encounter: Payer: Self-pay | Admitting: Cardiovascular Disease

## 2016-02-03 VITALS — BP 146/74 | HR 68 | Ht 68.0 in | Wt 163.0 lb

## 2016-02-03 DIAGNOSIS — I495 Sick sinus syndrome: Secondary | ICD-10-CM

## 2016-02-03 DIAGNOSIS — I480 Paroxysmal atrial fibrillation: Secondary | ICD-10-CM

## 2016-02-03 DIAGNOSIS — Z950 Presence of cardiac pacemaker: Secondary | ICD-10-CM | POA: Diagnosis not present

## 2016-02-03 DIAGNOSIS — I10 Essential (primary) hypertension: Secondary | ICD-10-CM

## 2016-02-03 DIAGNOSIS — Z9889 Other specified postprocedural states: Secondary | ICD-10-CM

## 2016-02-03 LAB — CUP PACEART INCLINIC DEVICE CHECK
Battery Voltage: 2.85 V
Brady Statistic AP VP Percent: 2.53 %
Brady Statistic AP VS Percent: 34.08 %
Brady Statistic RA Percent Paced: 36.61 %
Brady Statistic RV Percent Paced: 6.07 %
Implantable Lead Implant Date: 20081126
Implantable Lead Location: 753859
Implantable Lead Model: 5076
Lead Channel Impedance Value: 440 Ohm
Lead Channel Sensing Intrinsic Amplitude: 10.291 mV
Lead Channel Setting Pacing Amplitude: 3 V
Lead Channel Setting Pacing Pulse Width: 0.4 ms
Lead Channel Setting Sensing Sensitivity: 0.9 mV
MDC IDC LEAD IMPLANT DT: 20081126
MDC IDC LEAD LOCATION: 753860
MDC IDC MSMT LEADCHNL RA SENSING INTR AMPL: 0.176 mV
MDC IDC MSMT LEADCHNL RV IMPEDANCE VALUE: 424 Ohm
MDC IDC SESS DTM: 20170808093451
MDC IDC SET LEADCHNL RV PACING AMPLITUDE: 2 V
MDC IDC STAT BRADY AS VP PERCENT: 3.54 %
MDC IDC STAT BRADY AS VS PERCENT: 59.85 %

## 2016-02-03 MED ORDER — TRIAMTERENE-HCTZ 37.5-25 MG PO CAPS: 1.0000 | ORAL_CAPSULE | Freq: Every day | ORAL | 3 refills | Status: DC

## 2016-02-03 MED ORDER — METOPROLOL TARTRATE 50 MG PO TABS: 50.0000 mg | ORAL_TABLET | Freq: Two times a day (BID) | ORAL | 3 refills | Status: DC

## 2016-02-03 MED ORDER — WARFARIN SODIUM 2.5 MG PO TABS: ORAL_TABLET | ORAL | 1 refills | Status: DC

## 2016-02-03 NOTE — Patient Instructions (Signed)
Dr Croitoru recommends that you continue on your current medications as directed. Please refer to the Current Medication list given to you today.  Remote monitoring is used to monitor your Pacemaker of ICD from home. This monitoring reduces the number of office visits required to check your device to one time per year. It allows us to keep an eye on the functioning of your device to ensure it is working properly. You are scheduled for a device check from home on Tuesday, November 7th, 2017. You may send your transmission at any time that day. If you have a wireless device, the transmission will be sent automatically. After your physician reviews your transmission, you will receive a postcard with your next transmission date.  Dr Croitoru recommends that you schedule a follow-up appointment in 12 months with a pacemaker check. You will receive a reminder letter in the mail two months in advance. If you don't receive a letter, please call our office to schedule the follow-up appointment.  If you need a refill on your cardiac medications before your next appointment, please call your pharmacy. 

## 2016-02-03 NOTE — Progress Notes (Signed)
Cardiology Office Note    Date:  02/03/2016   ID:  [name], DOB 1928/08/20, MRN [id number]  PCP:  Kenn File, MD  Cardiologist:   Sanda Klein, MD   Chief Complaint  Patient presents with  . Follow-up    1 YEAR    History of Present Illness:  [name] is a 82 y.o. female with tachycardia-bradycardia syndrome (sinus node dysfunction and paroxysmal atrial fibrillation) here for follow-up on her dual-chamber permanent pacemaker (Medtronic EnRhythm 2008). She has hypertension. She has a history of mitral valve annuloplasty and Cox Maze cryoablation procedure via minithoracotomy in 2009, has preserved left ventricular systolic function, no residual mitral insufficiency, normal coronary arteries at preoperative angiogram.  Device interrogation shows normal pacemaker function of a battery that is approaching ERI (2.85 V, ERI at 2.81 pulse). There is 37% atrial pacing and only 6% ventricular pacing. Activity level is constant and roughly 3 hours a day. The burden of atrial fibrillation is roughly 5.6%, comparable to previous checks. During episodes of atrial fibrillation ventricular rate control is always excellent with average ventricular rates in the 60s-80s. The episodes of atrial arrhythmia lasts from a few seconds to roughly 24 hours.  She is aware of the atrial fibrillation as irregular heartbeats, but these do not associate shortness of breath, dizziness, syncope, focal neurological complaints. She is on chronic warfarin anticoagulation that is per her report always in therapeutic range and regulated by her family practice office in [place]. She has not had any bleeding complications.  Past Medical History:  Diagnosis Date  . A-fib ([place])   . Dyslipidemia   . Hypothyroidism    2 degree amidarone   . Pacemaker   . PAF (paroxysmal atrial fibrillation) ([place])   . S/P mitral valve repair   . SSS (sick sinus syndrome) ([place])   . Systemic hypertension     Past Surgical  History:  Procedure Laterality Date  . ABDOMINAL HYSTERECTOMY  1989  . CARDIAC CATHETERIZATION  01/11/2008   mild-mod pulm. hypertension,normal coronaries  . MINIMALLY INVASIVE MAZE PROCEDURE  02/2008  . MITRAL VALVE REPAIR  September 2009   [facility] phys[facility] ring annuloplasty  . NM MYOVIEW LTD  03/28/2007   no ischemia  . PACEMAKER PLACEMENT  November 2008  . US ECHOCARDIOGRAPHY  03/07/2012   mild LVH,EF =>55%,mild MR,mod TR,trace AI,right atrium mod. dilated    Current Medications: Outpatient Medications Prior to Visit  Medication Sig Dispense Refill  . acetaminophen (TYLENOL) 500 MG tablet Take 500 mg by mouth every 4 (four) hours as needed.     . cyclobenzaprine (FLEXERIL) 10 MG tablet Take 10 mg by mouth 3 (three) times daily as needed for muscle spasms.    Marland Kitchen levothyroxine (SYNTHROID, LEVOTHROID) 25 MCG tablet Take 1 tablet (25 mcg total) by mouth daily. 30 tablet 2  . metoprolol (LOPRESSOR) 50 MG tablet Take 1 tablet (50 mg total) by mouth 2 (two) times daily. <PLEASE MAKE APPOINTMENT FOR REFILLS> 180 tablet 0  . triamterene-hydrochlorothiazide (DYAZIDE) 37.5-25 MG capsule Take 1 each (1 capsule total) by mouth daily. <PLEASE MAKE APPOINTMENT FOR REFILLS> 90 capsule 0  . warfarin (COUMADIN) 2.5 MG tablet TAKE 1 TO 2 TABLETS DAILY AS DIRECTED 180 tablet 3   No facility-administered medications prior to visit.      Allergies:   Statins   Social History   Social History  . Marital status: Married    Spouse name: N/A  . Number of children: N/A  . Years of  education: N/A   Social History Main Topics  . Smoking status: Never Smoker  . Smokeless tobacco: Never Used  . Alcohol use No  . Drug use: Unknown  . Sexual activity: Not Asked   Other Topics Concern  . None   Social History Narrative  . None     ROS:   Please see the history of present illness.    ROS All other systems reviewed and are negative.   PHYSICAL EXAM:   VS:  BP (!) 146/74   Pulse 68   Ht 5\' 8"   (1.727 m)   Wt 163 lb (73.9 kg)   BMI 24.78 kg/m    GEN: Well nourished, well developed, in no acute distress  HEENT: normal  Neck: no JVD, carotid bruits, or masses Cardiac: RRR; no murmurs, rubs, or gallops,no edema , healthy left subclavian pacemaker site Respiratory:  clear to auscultation bilaterally, normal work of breathing GI: soft, nontender, nondistended, + BS MS: no deformity or atrophy  Skin: warm and dry, no rash Neuro:  Alert and Oriented x 3, Strength and sensation are intact Psych: euthymic mood, full affect  Wt Readings from Last 3 Encounters:  02/03/16 163 lb (73.9 kg)  11/26/14 162 lb 11.2 oz (73.8 kg)  09/05/14 160 lb (72.6 kg)      Studies/Labs Reviewed:   EKG:  EKG is ordered today.  The ekg ordered today demonstrates Atrial paced, ventricular sensed rhythm, T-wave inversion in leads V1-V3 (old), QTC 429 ms  Recent Labs: No results found for requested labs within last 8760 hours.   Lipid Panel    Component Value Date/Time   CHOL 201 (H) 12/26/2012 1057   TRIG 151 (H) 12/26/2012 1057   HDL 42 12/26/2012 1057   CHOLHDL 4.8 12/26/2012 1057   VLDL 30 12/26/2012 1057   LDLCALC 129 (H) 12/26/2012 1057        ASSESSMENT:    1. Paroxysmal atrial fibrillation (HCC)   2. SSS (sick sinus syndrome) ([place])   3. Pacemaker   4. S/P mitral valve repair   5. Essential hypertension      PLAN:  In order of problems listed above:  1. AFib: Burden of atrial fibrillation is relatively low and minimally symptomatic. Rate control is excellent. Continue warfarin anticoagulation. CHADSVasc 4 (age 74, HTN, gender). She does not have atrial fibrillation related to valvular (rheumatic) heart disease. 2. SSS: Heart rate histogram distribution suggests adequate sensor settings. 3. PPM: Normal pacemaker function with device approaching ERI, likely in the next 6 months. Discussed the procedure for generator change out. This can probably be done with continued warfarin  anticoagulation. 4. S/P MV repair: Clinically no evidence of recurrence of mitral insufficiency, no audible murmur and no symptoms of shortness of breath. Last echocardiogram performed in 2013. 5. HTN: Borderline elevation systolic blood pressure today, which the patient reports as being unusual for her. No changes made to her medications.    Medication Adjustments/Labs and Tests Ordered: Current medicines are reviewed at length with the patient today.  Concerns regarding medicines are outlined above.  Medication changes, Labs and Tests ordered today are listed in the Patient Instructions below. Patient Instructions  Dr Sallyanne Kuster recommends that you continue on your current medications as directed. Please refer to the Current Medication list given to you today.  Remote monitoring is used to monitor your Pacemaker of ICD from home. This monitoring reduces the number of office visits required to check your device to one time per year. It allows  [place] to keep an eye on the functioning of your device to ensure it is working properly. You are scheduled for a device check from home on Tuesday, November 7th, 2017. You may send your transmission at any time that day. If you have a wireless device, the transmission will be sent automatically. After your physician reviews your transmission, you will receive a postcard with your next transmission date.  Dr Sallyanne Kuster recommends that you schedule a follow-up appointment in 12 months with a pacemaker check. You will receive a reminder letter in the mail two months in advance. If you don't receive a letter, please call our office to schedule the follow-up appointment.  If you need a refill on your cardiac medications before your next appointment, please call your pharmacy.    Signed, Sanda Klein, MD  02/03/2016 9:33 AM    [facility] [address], [place], [place]  [address] Phone: [phone]; Fax: [phone]

## 2016-02-09 ENCOUNTER — Encounter: Payer: Self-pay | Admitting: Cardiovascular Disease

## 2016-02-13 ENCOUNTER — Ambulatory Visit (INDEPENDENT_AMBULATORY_CARE_PROVIDER_SITE_OTHER): Payer: MEDICARE | Admitting: Pharmacist Clinician (PhC)/ Clinical Pharmacy Specialist

## 2016-02-13 DIAGNOSIS — I482 Chronic atrial fibrillation, unspecified: Secondary | ICD-10-CM

## 2016-02-13 DIAGNOSIS — I480 Paroxysmal atrial fibrillation: Secondary | ICD-10-CM | POA: Diagnosis not present

## 2016-02-13 LAB — COAGUCHEK XS/INR WAIVED
INR: 2.4 — AB (ref 0.9–1.1)
PROTHROMBIN TIME: 28.4 s

## 2016-02-13 NOTE — Patient Instructions (Signed)
Anticoagulation Dose Instructions as of 02/13/2016      Dorene Grebe Tue Wed Thu Fri Sat   New Dose 2.5 mg 2.5 mg 5 mg 2.5 mg 5 mg 2.5 mg 2.5 mg    Description   Continue taking coumadin the same way  INR today 2.4

## 2016-03-24 DIAGNOSIS — H40013 Open angle with borderline findings, low risk, bilateral: Secondary | ICD-10-CM | POA: Diagnosis not present

## 2016-03-24 DIAGNOSIS — Z961 Presence of intraocular lens: Secondary | ICD-10-CM | POA: Diagnosis not present

## 2016-03-26 ENCOUNTER — Ambulatory Visit (INDEPENDENT_AMBULATORY_CARE_PROVIDER_SITE_OTHER): Payer: MEDICARE | Admitting: Pharmacist Clinician (PhC)/ Clinical Pharmacy Specialist

## 2016-03-26 DIAGNOSIS — I482 Chronic atrial fibrillation, unspecified: Secondary | ICD-10-CM

## 2016-03-26 DIAGNOSIS — I480 Paroxysmal atrial fibrillation: Secondary | ICD-10-CM

## 2016-03-26 DIAGNOSIS — Z23 Encounter for immunization: Secondary | ICD-10-CM | POA: Diagnosis not present

## 2016-03-26 LAB — COAGUCHEK XS/INR WAIVED
INR: 2 — ABNORMAL HIGH (ref 0.9–1.1)
Prothrombin Time: 23.8 s

## 2016-03-26 NOTE — Patient Instructions (Signed)
Anticoagulation Dose Instructions as of 03/26/2016      Dorene Grebe Tue Wed Thu Fri Sat   New Dose 2.5 mg 2.5 mg 5 mg 2.5 mg 5 mg 2.5 mg 2.5 mg    Description   Continue taking coumadin the same way  INR today 2.0   \

## 2016-05-04 ENCOUNTER — Ambulatory Visit (INDEPENDENT_AMBULATORY_CARE_PROVIDER_SITE_OTHER): Payer: MEDICARE | Admitting: *Deleted

## 2016-05-04 DIAGNOSIS — I495 Sick sinus syndrome: Secondary | ICD-10-CM

## 2016-05-04 NOTE — Progress Notes (Signed)
Remote pacemaker transmission.   

## 2016-05-05 ENCOUNTER — Encounter: Payer: Self-pay | Admitting: Cardiology

## 2016-05-10 ENCOUNTER — Encounter: Payer: MEDICARE | Admitting: Family Medicine

## 2016-05-21 ENCOUNTER — Ambulatory Visit (INDEPENDENT_AMBULATORY_CARE_PROVIDER_SITE_OTHER): Payer: MEDICARE | Admitting: Family Medicine

## 2016-05-21 ENCOUNTER — Ambulatory Visit (INDEPENDENT_AMBULATORY_CARE_PROVIDER_SITE_OTHER): Payer: MEDICARE

## 2016-05-21 ENCOUNTER — Encounter: Payer: Self-pay | Admitting: Family Medicine

## 2016-05-21 VITALS — BP 126/66 | HR 66 | Ht 68.0 in | Wt 165.0 lb

## 2016-05-21 DIAGNOSIS — Z780 Asymptomatic menopausal state: Secondary | ICD-10-CM

## 2016-05-21 DIAGNOSIS — Z950 Presence of cardiac pacemaker: Secondary | ICD-10-CM

## 2016-05-21 DIAGNOSIS — E039 Hypothyroidism, unspecified: Secondary | ICD-10-CM | POA: Diagnosis not present

## 2016-05-21 DIAGNOSIS — Z0000 Encounter for general adult medical examination without abnormal findings: Secondary | ICD-10-CM

## 2016-05-21 DIAGNOSIS — Z7901 Long term (current) use of anticoagulants: Secondary | ICD-10-CM | POA: Insufficient documentation

## 2016-05-21 DIAGNOSIS — I480 Paroxysmal atrial fibrillation: Secondary | ICD-10-CM | POA: Diagnosis not present

## 2016-05-21 DIAGNOSIS — E782 Mixed hyperlipidemia: Secondary | ICD-10-CM | POA: Diagnosis not present

## 2016-05-21 DIAGNOSIS — Z9889 Other specified postprocedural states: Secondary | ICD-10-CM | POA: Diagnosis not present

## 2016-05-21 LAB — URINALYSIS
BILIRUBIN UA: NEGATIVE
GLUCOSE, UA: NEGATIVE
KETONES UA: NEGATIVE
NITRITE UA: NEGATIVE
Protein, UA: NEGATIVE
Specific Gravity, UA: 1.01 (ref 1.005–1.030)
UUROB: 0.2 mg/dL (ref 0.2–1.0)
pH, UA: 6.5 (ref 5.0–7.5)

## 2016-05-21 NOTE — Progress Notes (Signed)
Subjective:   [name] is a 82 y.o. female who presents for Medicare Annual Wellness Visit.  Review of Systems  Review of Systems  Constitutional: Negative for chills, diaphoresis, fever, malaise/fatigue and weight loss.  HENT: Negative for congestion, ear pain, hearing loss, nosebleeds, sore throat and tinnitus.   Eyes: Negative for blurred vision, double vision, photophobia, pain, discharge and redness.  Respiratory: Negative for cough, hemoptysis, sputum production, shortness of breath and wheezing.   Cardiovascular: Negative for chest pain, palpitations, orthopnea, leg swelling and PND.  Gastrointestinal: Negative for abdominal pain, blood in stool, constipation, diarrhea, heartburn, melena, nausea and vomiting.  Genitourinary: Negative for dysuria, flank pain, frequency, hematuria and urgency.  Musculoskeletal: Negative for back pain, falls, myalgias and neck pain.  Skin: Negative for itching and rash.  Neurological: Negative for dizziness, tingling, tremors, sensory change, speech change, focal weakness, seizures, loss of consciousness, weakness and headaches.  Endo/Heme/Allergies: Negative for environmental allergies and polydipsia. Does not bruise/bleed easily.  Psychiatric/Behavioral: Negative for depression, hallucinations, memory loss, substance abuse and suicidal ideas. The patient is not nervous/anxious and does not have insomnia.      Current Medications (verified) Outpatient Encounter Prescriptions as of 05/21/2016  Medication Sig  . acetaminophen (TYLENOL) 500 MG tablet Take 500 mg by mouth every 4 (four) hours as needed.   . cyclobenzaprine (FLEXERIL) 10 MG tablet Take 10 mg by mouth 3 (three) times daily as needed for muscle spasms.  Marland Kitchen levothyroxine (SYNTHROID, LEVOTHROID) 25 MCG tablet Take 1 tablet (25 mcg total) by mouth daily.  . metoprolol (LOPRESSOR) 50 MG tablet Take 1 tablet (50 mg total) by mouth 2 (two) times daily.  Marland Kitchen triamterene-hydrochlorothiazide  (DYAZIDE) 37.5-25 MG capsule Take 1 each (1 capsule total) by mouth daily.  Marland Kitchen warfarin (COUMADIN) 2.5 MG tablet TAKE 1 TO 2 TABLETS DAILY AS DIRECTED   No facility-administered encounter medications on file as of 05/21/2016.     Allergies (verified) Statins   History: Past Medical History:  Diagnosis Date  . A-fib ([place])   . Dyslipidemia   . Hypothyroidism    2 degree amidarone   . Pacemaker   . PAF (paroxysmal atrial fibrillation) ([place])   . S/P mitral valve repair   . SSS (sick sinus syndrome) ([place])   . Systemic hypertension    Past Surgical History:  Procedure Laterality Date  . ABDOMINAL HYSTERECTOMY  1989  . CARDIAC CATHETERIZATION  01/11/2008   mild-mod pulm. hypertension,normal coronaries  . MINIMALLY INVASIVE MAZE PROCEDURE  02/2008  . MITRAL VALVE REPAIR  September 2009   [facility] physio ring annuloplasty  . NM MYOVIEW LTD  03/28/2007   no ischemia  . PACEMAKER PLACEMENT  November 2008  . US ECHOCARDIOGRAPHY  03/07/2012   mild LVH,EF =>55%,mild MR,mod TR,trace AI,right atrium mod. dilated   No family history on file. Social History   Occupational History  . Not on file.   Social History Main Topics  . Smoking status: Never Smoker  . Smokeless tobacco: Never Used  . Alcohol use No  . Drug use: Unknown  . Sexual activity: Not on file    Do you feel safe at home?  Yes Are there smokers in your home (other than you)? No  Dietary issues and exercise activities:    Current Dietary habits:  Careful diet, probably to heavy in carbs   Objective:    Today's Vitals   05/21/16 0910  BP: 126/66  Pulse: 66  Weight: 165 lb (74.8 kg)  Height: 5'  8" (1.727 m)   Body mass index is 25.09 kg/m.  Activities of Daily Living No flowsheet data found.      Depression Screen PHQ 2/9 Scores 05/21/2016 09/30/2015  PHQ - 2 Score 0 0  PHQ- 9 Score - 1     Fall Risk Fall Risk  05/21/2016  Falls in the past year? No    Cognitive Function: No flowsheet data  found.  Immunizations and Health Maintenance Immunization History  Administered Date(s) Administered  . Influenza, High Dose Seasonal PF 03/26/2016  . Influenza,inj,Quad PF,36+ Mos 04/24/2013, 05/01/2014, 04/08/2015   Health Maintenance Due  Topic Date Due  . TETANUS/TDAP  05/28/1950  . ZOSTAVAX  05/29/1991  . DEXA SCAN  05/28/1996  . PNA vac Low Risk Adult (1 of 2 - PCV13) 05/28/1996    Patient Care Team: Timmothy Euler, MD as PCP - General (Family Medicine) Lorene Dy, MD (Internal Medicine)  Indicate any recent Medical Services you may have received from other than [facility] providers in the past year (date may be approximate).    Assessment:    Annual Wellness Visit    Screening Tests Health Maintenance  Topic Date Due  . TETANUS/TDAP  05/28/1950  . ZOSTAVAX  05/29/1991  . DEXA SCAN  05/28/1996  . PNA vac Low Risk Adult (1 of 2 - PCV13) 05/28/1996  . INFLUENZA VACCINE  Completed        Plan:   During the course of the visit Cumi was educated and counseled about the following appropriate screening and preventive services:   Vaccines to include Pneumoccal, Influenza, Td, Zostavax,  Colorectal cancer screening  Cardiovascular disease screening  Diabetes screening  Bone Denisty / Osteoporosis Screening  Mammogram  PAP  Glaucoma screening / Diabetic Eye Exam  Nutrition counseling  Smoking cessation counseling  Advanced Directives  Physical Activity   Goals    None     1. Acquired hypothyroidism   2. Mixed hyperlipidemia   3. Paroxysmal atrial fibrillation (HCC)   4. Pacemaker   5. S/P mitral valve repair   6. Long term current use of anticoagulant   7. Post-menopause     No orders of the defined types were placed in this encounter.   Orders Placed This Encounter  Procedures  . DG [facility] DEXA    Standing Status:   Future    Number of Occurrences:   1    Standing Expiration Date:   07/21/2017    Order Specific Question:   Reason  for Exam (SYMPTOM  OR DIAGNOSIS REQUIRED)    Answer:   post  . CBC with Differential/Platelet  . CMP14+EGFR    Order Specific Question:   Has the patient fasted?    Answer:   Yes  . Lipid panel    Order Specific Question:   Has the patient fasted?    Answer:   Yes  . TSH + free T4  . Urinalysis     Patient Instructions (the written plan) were given to the patient.   Claretta Fraise, MD   05/21/2016

## 2016-05-22 LAB — CBC WITH DIFFERENTIAL/PLATELET
BASOS ABS: 0 10*3/uL (ref 0.0–0.2)
Basos: 1 %
EOS (ABSOLUTE): 0 10*3/uL (ref 0.0–0.4)
Eos: 0 %
Hematocrit: 39.1 % (ref 34.0–46.6)
Hemoglobin: 13.1 g/dL (ref 11.1–15.9)
Immature Grans (Abs): 0 10*3/uL (ref 0.0–0.1)
Immature Granulocytes: 0 %
LYMPHS ABS: 1.8 10*3/uL (ref 0.7–3.1)
Lymphs: 37 %
MCH: 29.5 pg (ref 26.6–33.0)
MCHC: 33.5 g/dL (ref 31.5–35.7)
MCV: 88 fL (ref 79–97)
MONOS ABS: 0.5 10*3/uL (ref 0.1–0.9)
Monocytes: 9 %
Neutrophils Absolute: 2.6 10*3/uL (ref 1.4–7.0)
Neutrophils: 53 %
Platelets: 193 10*3/uL (ref 150–379)
RBC: 4.44 x10E6/uL (ref 3.77–5.28)
RDW: 15 % (ref 12.3–15.4)
WBC: 5 10*3/uL (ref 3.4–10.8)

## 2016-05-22 LAB — CMP14+EGFR
ALK PHOS: 53 IU/L (ref 39–117)
ALT: 12 IU/L (ref 0–32)
AST: 21 IU/L (ref 0–40)
Albumin/Globulin Ratio: 2 (ref 1.2–2.2)
Albumin: 4.5 g/dL (ref 3.5–4.7)
BUN/Creatinine Ratio: 22 (ref 12–28)
BUN: 28 mg/dL — AB (ref 8–27)
Bilirubin Total: 0.6 mg/dL (ref 0.0–1.2)
CHLORIDE: 99 mmol/L (ref 96–106)
CO2: 27 mmol/L (ref 18–29)
CREATININE: 1.27 mg/dL — AB (ref 0.57–1.00)
Calcium: 9.4 mg/dL (ref 8.7–10.3)
GFR calc Af Amer: 45 mL/min/{1.73_m2} — ABNORMAL LOW (ref >59)
GFR calc non Af Amer: 39 mL/min/{1.73_m2} — ABNORMAL LOW (ref >59)
GLOBULIN, TOTAL: 2.2 g/dL (ref 1.5–4.5)
GLUCOSE: 84 mg/dL (ref 65–99)
Potassium: 3.5 mmol/L (ref 3.5–5.2)
SODIUM: 141 mmol/L (ref 134–144)
Total Protein: 6.7 g/dL (ref 6.0–8.5)

## 2016-05-22 LAB — LIPID PANEL
CHOLESTEROL TOTAL: 223 mg/dL — AB (ref 100–199)
Chol/HDL Ratio: 4.3 ratio units (ref 0.0–4.4)
HDL: 52 mg/dL (ref >39)
LDL CALC: 147 mg/dL — AB (ref 0–99)
TRIGLYCERIDES: 120 mg/dL (ref 0–149)
VLDL Cholesterol Cal: 24 mg/dL (ref 5–40)

## 2016-05-22 LAB — TSH+FREE T4
Free T4: 1.09 ng/dL (ref 0.82–1.77)
TSH: 2.36 u[IU]/mL (ref 0.450–4.500)

## 2016-05-31 LAB — CUP PACEART REMOTE DEVICE CHECK
Battery Voltage: 2.83 V
Brady Statistic AP VP Percent: 3.26 %
Brady Statistic AS VP Percent: 4.45 %
Brady Statistic RA Percent Paced: 38.97 %
Implantable Lead Implant Date: 20081126
Implantable Lead Location: 753860
Implantable Lead Model: 5076
Implantable Pulse Generator Implant Date: 20081126
Lead Channel Impedance Value: 416 Ohm
Lead Channel Sensing Intrinsic Amplitude: 0.22 mV
Lead Channel Setting Pacing Amplitude: 3 V
MDC IDC LEAD IMPLANT DT: 20081126
MDC IDC LEAD LOCATION: 753859
MDC IDC MSMT LEADCHNL RA IMPEDANCE VALUE: 440 Ohm
MDC IDC SESS DTM: 20171107150910
MDC IDC SET LEADCHNL RV PACING AMPLITUDE: 2 V
MDC IDC SET LEADCHNL RV PACING PULSEWIDTH: 0.4 ms
MDC IDC SET LEADCHNL RV SENSING SENSITIVITY: 0.9 mV
MDC IDC STAT BRADY AP VS PERCENT: 35.72 %
MDC IDC STAT BRADY AS VS PERCENT: 56.57 %
MDC IDC STAT BRADY RV PERCENT PACED: 7.71 %

## 2016-06-03 ENCOUNTER — Ambulatory Visit (INDEPENDENT_AMBULATORY_CARE_PROVIDER_SITE_OTHER): Payer: MEDICARE | Admitting: *Deleted

## 2016-06-03 ENCOUNTER — Telehealth: Payer: Self-pay | Admitting: Cardiology

## 2016-06-03 DIAGNOSIS — I495 Sick sinus syndrome: Secondary | ICD-10-CM | POA: Diagnosis not present

## 2016-06-03 NOTE — Telephone Encounter (Signed)
LMOVM reminding pt to send remote transmission.   

## 2016-06-03 NOTE — Progress Notes (Signed)
Remote pacemaker transmission.   

## 2016-06-04 ENCOUNTER — Ambulatory Visit (INDEPENDENT_AMBULATORY_CARE_PROVIDER_SITE_OTHER): Payer: MEDICARE | Admitting: Pharmacist Clinician (PhC)/ Clinical Pharmacy Specialist

## 2016-06-04 DIAGNOSIS — I480 Paroxysmal atrial fibrillation: Secondary | ICD-10-CM | POA: Diagnosis not present

## 2016-06-04 DIAGNOSIS — I482 Chronic atrial fibrillation, unspecified: Secondary | ICD-10-CM

## 2016-06-04 LAB — COAGUCHEK XS/INR WAIVED
INR: 1.5 — AB (ref 0.9–1.1)
PROTHROMBIN TIME: 17.6 s

## 2016-06-04 MED ORDER — RISEDRONATE SODIUM 150 MG PO TABS: 150.0000 mg | ORAL_TABLET | ORAL | 4 refills | Status: DC

## 2016-06-04 NOTE — Progress Notes (Signed)
Osteoporosis Clinic Current Height:        Max Lifetime Height:  5'9" Current Weight:       Max Lifetime Weight:  5'8"  Ethnicity: Not Hispanic or Latino [1] BP:       HR:         HPI: Does pt already have a diagnosis of:  Osteopenia?  Yes  Osteoporosis?  Yes  Back Pain?  Yes       Kyphosis?  Yes Prior fracture?  No Med(s) for Osteoporosis/Osteopenia:  none Med(s) previously tried for Osteoporosis/Osteopenia:  none                                                             PMH: Age at menopause:  unknown Hysterectomy?  No Oophorectomy?  No HRT? Yes - Former.  Type/duration: unknown Steroid Use?  No Thyroid med?  Yes History of cancer?  No History of digestive disorders (ie Crohn's)?  No Current or previous eating disorders?  No Last Vitamin D Result: none will check in 2 weks Last GFR Result:  80ml/min   FH/SH: Family history of osteoporosis?  Yes sister Parent with history of hip fracture?  No Family history of breast cancer?  No Exercise?  No Caffeine?  No Smoking?  No Alcohol?  No    Calcium Assessment Calcium Intake  # of servings/day  Calcium mg  Milk (8 oz) 1  x  300  = 300  Yogurt (8 oz) 0 x  400 = 0  Cheese (1 oz) 0 x  200 = 0  Non dairy sources   250 mg  Ca supplement 0 = 0   Estimated calcium intake per day 550mg     DEXA Results Date of Test T-Score for AP Spine L1-L4 T-Score for Left Neck of Hip T-Score for Right Neck of Hip  11/24 -2.7 -2.6                    FRAX 10 year estimate: Total FX risk: 34%     (consider medication if >/= 20%) Hip FX risk: 20%   (consider medication if >/= 3%)  Assessment: New onset Osteoporosis  Recommendations: Changes in Osteo meds:  Start actonel 150mg  q 30 days Weight bearing exercise Educate on fall preventtion - counseling and educational materials provided Recheck DEXA:  2 years Will check GFR in 3 months to make sure it does not drop below 30 ml/min.  Recent lab was a higher serum creatinine then  previous readings.    Time spent counseling patient:  30 minutes

## 2016-06-09 ENCOUNTER — Encounter: Payer: Self-pay | Admitting: Cardiology

## 2016-06-18 ENCOUNTER — Ambulatory Visit (INDEPENDENT_AMBULATORY_CARE_PROVIDER_SITE_OTHER): Payer: MEDICARE | Admitting: Pharmacist Clinician (PhC)/ Clinical Pharmacy Specialist

## 2016-06-18 DIAGNOSIS — I482 Chronic atrial fibrillation, unspecified: Secondary | ICD-10-CM

## 2016-06-18 DIAGNOSIS — I480 Paroxysmal atrial fibrillation: Secondary | ICD-10-CM | POA: Diagnosis not present

## 2016-06-18 LAB — COAGUCHEK XS/INR WAIVED
INR: 1.7 — AB (ref 0.9–1.1)
PROTHROMBIN TIME: 20.6 s

## 2016-06-18 MED ORDER — RISEDRONATE SODIUM 150 MG PO TABS: 150.0000 mg | ORAL_TABLET | ORAL | 4 refills | Status: DC

## 2016-06-18 NOTE — Patient Instructions (Signed)
Anticoagulation Dose Instructions as of 06/18/2016      [name] Tue Wed Thu Fri Sat   New Dose 5 mg 2.5 mg 5 mg 2.5 mg 5 mg 2.5 mg 5 mg    Description   Follow the above new directions.    INR today 1.7  Still a little thin

## 2016-06-30 LAB — CUP PACEART REMOTE DEVICE CHECK
Brady Statistic AP VP Percent: 2.48 %
Brady Statistic AS VP Percent: 4.84 %
Brady Statistic AS VS Percent: 58.3 %
Date Time Interrogation Session: 20171207200243
Implantable Lead Implant Date: 20081126
Implantable Lead Location: 753859
Implantable Lead Location: 753860
Implantable Lead Model: 5076
Implantable Pulse Generator Implant Date: 20081126
Lead Channel Impedance Value: 416 Ohm
Lead Channel Sensing Intrinsic Amplitude: 0.22 mV
Lead Channel Sensing Intrinsic Amplitude: 9.948 mV
Lead Channel Setting Pacing Amplitude: 2 V
MDC IDC LEAD IMPLANT DT: 20081126
MDC IDC MSMT BATTERY VOLTAGE: 2.82 V
MDC IDC MSMT LEADCHNL RA IMPEDANCE VALUE: 448 Ohm
MDC IDC SET LEADCHNL RA PACING AMPLITUDE: 3 V
MDC IDC SET LEADCHNL RV PACING PULSEWIDTH: 0.4 ms
MDC IDC SET LEADCHNL RV SENSING SENSITIVITY: 0.9 mV
MDC IDC STAT BRADY AP VS PERCENT: 34.38 %
MDC IDC STAT BRADY RA PERCENT PACED: 35.58 %
MDC IDC STAT BRADY RV PERCENT PACED: 7.47 %

## 2016-07-02 ENCOUNTER — Ambulatory Visit (INDEPENDENT_AMBULATORY_CARE_PROVIDER_SITE_OTHER): Payer: MEDICARE | Admitting: Pharmacist Clinician (PhC)/ Clinical Pharmacy Specialist

## 2016-07-02 DIAGNOSIS — I480 Paroxysmal atrial fibrillation: Secondary | ICD-10-CM

## 2016-07-02 DIAGNOSIS — I482 Chronic atrial fibrillation, unspecified: Secondary | ICD-10-CM

## 2016-07-02 LAB — COAGUCHEK XS/INR WAIVED
INR: 2.9 — ABNORMAL HIGH (ref 0.9–1.1)
PROTHROMBIN TIME: 34.8 s

## 2016-07-02 NOTE — Patient Instructions (Signed)
Anticoagulation Dose Instructions as of 07/02/2016      [name] Tue Wed Thu Fri Sat   New Dose 5 mg 2.5 mg 5 mg 2.5 mg 5 mg 2.5 mg 5 mg    Description   Continue taking warfarin the same way as listed above.  INR today is 2.9

## 2016-07-05 ENCOUNTER — Ambulatory Visit (INDEPENDENT_AMBULATORY_CARE_PROVIDER_SITE_OTHER): Payer: MEDICARE | Admitting: *Deleted

## 2016-07-05 DIAGNOSIS — Z950 Presence of cardiac pacemaker: Secondary | ICD-10-CM

## 2016-07-05 NOTE — Progress Notes (Signed)
Remote pacemaker transmission.   

## 2016-07-07 ENCOUNTER — Encounter: Payer: Self-pay | Admitting: Cardiology

## 2016-07-08 LAB — CUP PACEART REMOTE DEVICE CHECK
Battery Voltage: 2.81 V
Brady Statistic AP VP Percent: 2.7 %
Brady Statistic AS VP Percent: 6.46 %
Brady Statistic RA Percent Paced: 34.3 %
Brady Statistic RV Percent Paced: 9.37 %
Date Time Interrogation Session: 20180108150212
Implantable Lead Implant Date: 20081126
Implantable Lead Location: 753859
Implantable Lead Location: 753860
Implantable Lead Model: 5076
Implantable Pulse Generator Implant Date: 20081126
Lead Channel Setting Pacing Amplitude: 2 V
Lead Channel Setting Pacing Amplitude: 3 V
Lead Channel Setting Pacing Pulse Width: 0.4 ms
Lead Channel Setting Sensing Sensitivity: 0.9 mV
MDC IDC LEAD IMPLANT DT: 20081126
MDC IDC MSMT LEADCHNL RA IMPEDANCE VALUE: 432 Ohm
MDC IDC MSMT LEADCHNL RA SENSING INTR AMPL: 0.22 mV
MDC IDC MSMT LEADCHNL RV IMPEDANCE VALUE: 400 Ohm
MDC IDC MSMT LEADCHNL RV SENSING INTR AMPL: 9.262 mV
MDC IDC STAT BRADY AP VS PERCENT: 33.67 %
MDC IDC STAT BRADY AS VS PERCENT: 57.17 %

## 2016-08-03 ENCOUNTER — Telehealth: Payer: Self-pay | Admitting: Cardiology

## 2016-08-03 NOTE — Telephone Encounter (Signed)
Spoke w/ pt and informed her that her device reached RRT on 07-06-16. Informed her that a scheduler will contact her to schedule an appt w/ MD / PA / NP pt verbalized understanding.

## 2016-08-11 ENCOUNTER — Encounter: Payer: Self-pay | Admitting: Cardiovascular Disease

## 2016-08-11 ENCOUNTER — Ambulatory Visit (INDEPENDENT_AMBULATORY_CARE_PROVIDER_SITE_OTHER): Payer: MEDICARE | Admitting: Cardiovascular Disease

## 2016-08-11 VITALS — BP 122/80 | HR 67 | Ht 68.0 in | Wt 163.0 lb

## 2016-08-11 DIAGNOSIS — I480 Paroxysmal atrial fibrillation: Secondary | ICD-10-CM

## 2016-08-11 DIAGNOSIS — R5383 Other fatigue: Secondary | ICD-10-CM | POA: Diagnosis not present

## 2016-08-11 DIAGNOSIS — I10 Essential (primary) hypertension: Secondary | ICD-10-CM | POA: Diagnosis not present

## 2016-08-11 DIAGNOSIS — Z7901 Long term (current) use of anticoagulants: Secondary | ICD-10-CM

## 2016-08-11 DIAGNOSIS — Z950 Presence of cardiac pacemaker: Secondary | ICD-10-CM

## 2016-08-11 DIAGNOSIS — Z01812 Encounter for preprocedural laboratory examination: Secondary | ICD-10-CM

## 2016-08-11 DIAGNOSIS — I495 Sick sinus syndrome: Secondary | ICD-10-CM | POA: Diagnosis not present

## 2016-08-11 DIAGNOSIS — Z9889 Other specified postprocedural states: Secondary | ICD-10-CM

## 2016-08-11 DIAGNOSIS — Z45010 Encounter for checking and testing of cardiac pacemaker pulse generator [battery]: Secondary | ICD-10-CM | POA: Diagnosis not present

## 2016-08-11 LAB — CUP PACEART INCLINIC DEVICE CHECK
Battery Voltage: 2.81 V
Brady Statistic AP VP Percent: 2.47 %
Brady Statistic AS VP Percent: 11.86 %
Brady Statistic RA Percent Paced: 29.93 %
Brady Statistic RV Percent Paced: 15.92 %
Date Time Interrogation Session: 20180214144527
Implantable Lead Implant Date: 20081126
Implantable Lead Location: 753859
Implantable Lead Model: 5076
Implantable Pulse Generator Implant Date: 20081126
Lead Channel Impedance Value: 448 Ohm
Lead Channel Sensing Intrinsic Amplitude: 0.352 mV
Lead Channel Setting Pacing Amplitude: 2 V
Lead Channel Setting Pacing Amplitude: 3 V
Lead Channel Setting Pacing Pulse Width: 0.4 ms
MDC IDC LEAD IMPLANT DT: 20081126
MDC IDC LEAD LOCATION: 753860
MDC IDC MSMT LEADCHNL RV IMPEDANCE VALUE: 432 Ohm
MDC IDC MSMT LEADCHNL RV PACING THRESHOLD AMPLITUDE: 1 V
MDC IDC MSMT LEADCHNL RV PACING THRESHOLD PULSEWIDTH: 0.4 ms
MDC IDC MSMT LEADCHNL RV SENSING INTR AMPL: 10.634 mV
MDC IDC SET LEADCHNL RV SENSING SENSITIVITY: 0.9 mV
MDC IDC STAT BRADY AP VS PERCENT: 28.96 %
MDC IDC STAT BRADY AS VS PERCENT: 56.72 %

## 2016-08-11 MED ORDER — LEVOTHYROXINE SODIUM 25 MCG PO TABS: 25.0000 ug | ORAL_TABLET | Freq: Every day | ORAL | 2 refills | Status: DC

## 2016-08-11 NOTE — Patient Instructions (Addendum)
Dr Sallyanne Kuster recommends that you have your pacemaker battery changed out on Wednesday February 28th   , 2018. This will be performed at [facility] will receive detailed instructions upon scheduling.  You will need to have blood work completed 7-14 days prior to the scheduled procedure date.  Pacemaker Battery Change A pacemaker battery usually lasts 4 to 12 years. Once or twice per year, you will be asked to visit your health care provider to have a full evaluation of your pacemaker. When a battery needs to be replaced, the entire pacemaker is replaced so that you can benefit from new circuitry and any new features that have been added to pacemakers. Most often, this procedure is very simple because the leads are already in place.  There are many things that affect how long a pacemaker battery will last, including:   The age of the pacemaker.   The number of leads (1, 2, or 3).   The pacemaker work load. If the pacemaker is helping the heart more often, the battery will not last as long as it would if the pacemaker did not need to help the heart.   Power (voltage) settings. LET [facility] CARE PROVIDER KNOW ABOUT:   Any allergies you have.   All medicines you are taking, including vitamins, herbs, eye drops, creams, and over-the-counter medicines.   Previous problems you or members of your family have had with the use of anesthetics.   Any blood disorders you have.   Previous surgeries you have had, especially since your last pacemaker placement.   Medical conditions you have.   Possibility of pregnancy, if this applies.  Symptoms of chest pain, trouble breathing, palpitations, light-headedness, or feelings of an abnormal or irregular heartbeat. RISKS AND COMPLICATIONS  Generally, this is a safe procedure. However, as with any procedure, problems can occur and include:   Bleeding.   Bruising of the skin around where the incision was made.   Pain at the  incision site.   Pulling apart of the skin at the incision site.   Infection.   Allergic reaction to anesthetics or other medicines used during the procedure.  People with diabetes may have a temporary increase in their blood sugar after any surgical procedure.  BEFORE THE PROCEDURE   Wash all of the skin around the area of the chest where the pacemaker is located.   Ask your health care provider for help with any medicine adjustments before the pacemaker is replaced.   Do not eat or drink anything after midnight on the night before the procedure or as directed by your health care provider.  Ask your health care provider if you can take a sip of water with any approved medicines the morning of the procedure. PROCEDURE   After giving medicine to numb the skin (local anesthetic), your health care provider will make a cut to reopen the pocket holding the pacemaker.   The old pacemaker will be disconnected from its leads.   The leads will be tested.   If needed, the leads will be replaced. If the leads are functioning properly, the new pacemaker may be connected to the existing leads.  A heart monitor and the pacemaker programmer will be used to make sure that the new pacemaker is working properly.  The incision site will then be closed. A dressing will be placed over the pacemaker site. The dressing will be removed 24-48 hours afterward. AFTER THE PROCEDURE   You will be taken to  a recovery area after the new pacemaker implant is completed. Your vital signs such as blood pressure, heart rate, breathing, and oxygen levels will be monitored.  Your health care provider will tell you when you will need to next test your pacemaker or when to return to the office for follow-up for removal of stitches. This information is not intended to replace advice given to you by your health care provider. Make sure you discuss any questions you have with your health care provider. Document  Released: 09/22/2006 Document Revised: 07/05/2014 Document Reviewed: 12/27/2012 Elsevier Interactive Patient Education  2017 [employer].

## 2016-08-11 NOTE — Progress Notes (Signed)
Cardiology Office Note    Date:  08/11/2016   ID:  [name], DOB 11/16/1928, MRN [id number]  PCP:  Kenn File, MD  Cardiologist:   Sanda Klein, MD   Chief Complaint  Patient presents with  . Follow-up    History of Present Illness:  [name] is a 83 y.o. female with tachycardia-bradycardia syndrome (sinus node dysfunction and paroxysmal atrial fibrillation) here for follow-up on her dual-chamber permanent pacemaker (Medtronic EnRhythm 2008). Her pacemaker reached elective replacement indicator on 07/06/2016. She is not pacemaker dependent.  Pacemaker was implanted in 2008 for sinus node dysfunction and tachycardia bradycardia syndrome. She has roughly 29% atrial pacing and 14% ventricular pacing. The burden of atrial fibrillation 6.8%. During the atrial fibrillation the ventricular rate control is excellent. The episodes last for anywhere from a few hours to about 24 hours Lead parameters appear to be excellent.  She has hypertension. She has a history of mitral valve annuloplasty and Cox Maze cryoablation procedure via minithoracotomy in 2009, has preserved left ventricular systolic function, no residual mitral insufficiency, normal coronary arteries at preoperative angiogram.  At times she is aware of the palpitations, but she denies shortness of breath, dizziness, syncope, focal neurological complaints. She is on chronic warfarin anticoagulation, regulated by her family practice office in [place]. She has not had any bleeding complications. Her most recent INR was in the upper range of desirable at 2.9.  Past Medical History:  Diagnosis Date  . A-fib ([place])   . Dyslipidemia   . Hypothyroidism    2 degree amidarone   . Pacemaker   . PAF (paroxysmal atrial fibrillation) ([place])   . S/P mitral valve repair   . SSS (sick sinus syndrome) ([place])   . Systemic hypertension     Past Surgical History:  Procedure Laterality Date  . ABDOMINAL HYSTERECTOMY  1989  .  CARDIAC CATHETERIZATION  01/11/2008   mild-mod pulm. hypertension,normal coronaries  . MINIMALLY INVASIVE MAZE PROCEDURE  02/2008  . MITRAL VALVE REPAIR  September 2009   edwards physio ring annuloplasty  . NM MYOVIEW LTD  03/28/2007   no ischemia  . PACEMAKER PLACEMENT  November 2008  . US ECHOCARDIOGRAPHY  03/07/2012   mild LVH,EF =>55%,mild MR,mod TR,trace AI,right atrium mod. dilated    Current Medications: Outpatient Medications Prior to Visit  Medication Sig Dispense Refill  . acetaminophen (TYLENOL) 500 MG tablet Take 500 mg by mouth every 4 (four) hours as needed.     . cyclobenzaprine (FLEXERIL) 10 MG tablet Take 10 mg by mouth 3 (three) times daily as needed for muscle spasms.    . metoprolol (LOPRESSOR) 50 MG tablet Take 1 tablet (50 mg total) by mouth 2 (two) times daily. 180 tablet 3  . risedronate (ACTONEL) 150 MG tablet Take 1 tablet (150 mg total) by mouth every 30 (thirty) days. with water on empty stomach, nothing by mouth or lie down for next 30 minutes. 3 tablet 4  . triamterene-hydrochlorothiazide (DYAZIDE) 37.5-25 MG capsule Take 1 each (1 capsule total) by mouth daily. 90 capsule 3  . warfarin (COUMADIN) 2.5 MG tablet TAKE 1 TO 2 TABLETS DAILY AS DIRECTED 180 tablet 1  . levothyroxine (SYNTHROID, LEVOTHROID) 25 MCG tablet Take 1 tablet (25 mcg total) by mouth daily. 30 tablet 2   No facility-administered medications prior to visit.      Allergies:   Statins   Social History   Social History  . Marital status: Married    Spouse name: N/A  .  Number of children: N/A  . Years of education: N/A   Social History Main Topics  . Smoking status: Never Smoker  . Smokeless tobacco: Never Used  . Alcohol use No  . Drug use: Unknown  . Sexual activity: Not Asked   Other Topics Concern  . None   Social History Narrative  . None     ROS:   Please see the history of present illness.    ROS All other systems reviewed and are negative.   PHYSICAL EXAM:   VS:   BP 122/80 (BP Location: Right Arm, Patient Position: Sitting, Cuff Size: Normal)   Pulse 67   Ht 5\' 8"  (1.727 m)   Wt 73.9 kg (163 lb)   BMI 24.78 kg/m    GEN: Well nourished, well developed, in no acute distress  HEENT: normal  Neck: no JVD, carotid bruits, or masses Cardiac: RRR; no murmurs, rubs, or gallops,no edema , healthy left subclavian pacemaker site Respiratory:  clear to auscultation bilaterally, normal work of breathing GI: soft, nontender, nondistended, + BS MS: no deformity or atrophy  Skin: warm and dry, no rash Neuro:  Alert and Oriented x 3, Strength and sensation are intact Psych: euthymic mood, full affect  Wt Readings from Last 3 Encounters:  08/11/16 73.9 kg (163 lb)  05/21/16 74.8 kg (165 lb)  02/03/16 73.9 kg (163 lb)      Studies/Labs Reviewed:   EKG:  EKG is ordered today.  The ekg ordered today demonstrates Atrial fibrillation, ventricular paced rhythm, mild T-wave inversion in leads V1-V3 (old), QTC 420 ms  Recent Labs: 05/21/2016: ALT 12; BUN 28; Creatinine, Ser 1.27; Platelets 193; Potassium 3.5; Sodium 141; TSH 2.360   Lipid Panel    Component Value Date/Time   CHOL 223 (H) 05/21/2016 0954   TRIG 120 05/21/2016 0954   HDL 52 05/21/2016 0954   CHOLHDL 4.3 05/21/2016 0954   CHOLHDL 4.8 12/26/2012 1057   VLDL 30 12/26/2012 1057   LDLCALC 147 (H) 05/21/2016 0954        ASSESSMENT:    1. Paroxysmal atrial fibrillation (HCC)   2. SSS (sick sinus syndrome) ([place])   3. Pacemaker battery depletion   4. Long term current use of anticoagulant   5. S/P mitral valve repair   6. Essential hypertension   7. Other fatigue   8. Pre-procedure lab exam      PLAN:  In order of problems listed above:  1. AFib: Burden of atrial fibrillation is relatively low and minimally symptomatic. Rate control is excellent. Continue warfarin anticoagulation. CHADSVasc 4 (age 32, HTN, gender). She does not have atrial fibrillation related to valvular (rheumatic)  heart disease. 2. SSS: Heart rate histogram distribution suggests adequate sensor settings. 3. PPM: at [facility].Marland Kitchen Discussed the procedure for generator change out. This procedure has been fully reviewed with the patient and written informed consent has been obtained. 4. Warfarin: Generator change-out can probably be done with continued warfarin anticoagulation, but would like to have the INR closer to 2.0 rather than 3.0. 5. S/P MV repair: Clinically no evidence of recurrence of mitral insufficiency, no audible murmur and no symptoms of shortness of breath. Last echocardiogram performed in 2013. 6. HTN: Excellent control.    Medication Adjustments/Labs and Tests Ordered: Current medicines are reviewed at length with the patient today.  Concerns regarding medicines are outlined above.  Medication changes, Labs and Tests ordered today are listed in the Patient Instructions below. Patient Instructions  Dr Sallyanne Kuster recommends that you  have your pacemaker battery changed out on Wednesday February 28th   , 2018. This will be performed at [place] will receive detailed instructions upon scheduling.  You will need to have blood work completed 7-14 days prior to the scheduled procedure date.  Pacemaker Battery Change A pacemaker battery usually lasts 4 to 12 years. Once or twice per year, you will be asked to visit your health care provider to have a full evaluation of your pacemaker. When a battery needs to be replaced, the entire pacemaker is replaced so that you can benefit from new circuitry and any new features that have been added to pacemakers. Most often, this procedure is very simple because the leads are already in place.  There are many things that affect how long a pacemaker battery will last, including:   The age of the pacemaker.   The number of leads (1, 2, or 3).   The pacemaker work load. If the pacemaker is helping the heart more often, the battery will not last as long  as it would if the pacemaker did not need to help the heart.   Power (voltage) settings. LET [facility] CARE PROVIDER KNOW ABOUT:   Any allergies you have.   All medicines you are taking, including vitamins, herbs, eye drops, creams, and over-the-counter medicines.   Previous problems you or members of your family have had with the use of anesthetics.   Any blood disorders you have.   Previous surgeries you have had, especially since your last pacemaker placement.   Medical conditions you have.   Possibility of pregnancy, if this applies.  Symptoms of chest pain, trouble breathing, palpitations, light-headedness, or feelings of an abnormal or irregular heartbeat. RISKS AND COMPLICATIONS  Generally, this is a safe procedure. However, as with any procedure, problems can occur and include:   Bleeding.   Bruising of the skin around where the incision was made.   Pain at the incision site.   Pulling apart of the skin at the incision site.   Infection.   Allergic reaction to anesthetics or other medicines used during the procedure.  People with diabetes may have a temporary increase in their blood sugar after any surgical procedure.  BEFORE THE PROCEDURE   Wash all of the skin around the area of the chest where the pacemaker is located.   Ask your health care provider for help with any medicine adjustments before the pacemaker is replaced.   Do not eat or drink anything after midnight on the night before the procedure or as directed by your health care provider.  Ask your health care provider if you can take a sip of water with any approved medicines the morning of the procedure. PROCEDURE   After giving medicine to numb the skin (local anesthetic), your health care provider will make a cut to reopen the pocket holding the pacemaker.   The old pacemaker will be disconnected from its leads.   The leads will be tested.   If needed, the leads will be  replaced. If the leads are functioning properly, the new pacemaker may be connected to the existing leads.  A heart monitor and the pacemaker programmer will be used to make sure that the new pacemaker is working properly.  The incision site will then be closed. A dressing will be placed over the pacemaker site. The dressing will be removed 24-48 hours afterward. AFTER THE PROCEDURE   You will be taken to a recovery area after  the new pacemaker implant is completed. Your vital signs such as blood pressure, heart rate, breathing, and oxygen levels will be monitored.  Your health care provider will tell you when you will need to next test your pacemaker or when to return to the office for follow-up for removal of stitches. This information is not intended to replace advice given to you by your health care provider. Make sure you discuss any questions you have with your health care provider. Document Released: 09/22/2006 Document Revised: 07/05/2014 Document Reviewed: 12/27/2012 Elsevier Interactive Patient Education  2017 [place], Sanda Klein, MD  08/11/2016 6:39 PM    [facility] [address], [place], [place]  [address] Phone: [phone]; Fax: [phone]

## 2016-08-13 ENCOUNTER — Ambulatory Visit (INDEPENDENT_AMBULATORY_CARE_PROVIDER_SITE_OTHER): Payer: MEDICARE | Admitting: Pharmacist

## 2016-08-13 DIAGNOSIS — R5383 Other fatigue: Secondary | ICD-10-CM | POA: Diagnosis not present

## 2016-08-13 DIAGNOSIS — Z01812 Encounter for preprocedural laboratory examination: Secondary | ICD-10-CM | POA: Diagnosis not present

## 2016-08-13 DIAGNOSIS — I482 Chronic atrial fibrillation, unspecified: Secondary | ICD-10-CM

## 2016-08-13 DIAGNOSIS — I480 Paroxysmal atrial fibrillation: Secondary | ICD-10-CM | POA: Diagnosis not present

## 2016-08-13 DIAGNOSIS — D689 Coagulation defect, unspecified: Secondary | ICD-10-CM | POA: Diagnosis not present

## 2016-08-13 DIAGNOSIS — Z45010 Encounter for checking and testing of cardiac pacemaker pulse generator [battery]: Secondary | ICD-10-CM | POA: Diagnosis not present

## 2016-08-13 LAB — COAGUCHEK XS/INR WAIVED
INR: 3.1 — ABNORMAL HIGH (ref 0.9–1.1)
Prothrombin Time: 37.3 s

## 2016-08-13 NOTE — Patient Instructions (Signed)
Anticoagulation Dose Instructions as of 08/13/2016      Dorene Grebe Tue Wed Thu Fri Sat   New Dose 5 mg 2.5 mg 5 mg 2.5 mg 5 mg 2.5 mg 5 mg    Description   Continue taking warfarin the same way as listed above. Try to eat some more Vitamin K foods such as cabbage, slaw, and spinach. Eat some today if possible. It is good you have already talked to your cardiologist about how to take warfarin with upcoming procedure to have battery changed in Pacemaker. Be sure to follow their instructions.   INR today is 3.1 (goal: 2-3) it is a little thin today

## 2016-08-14 LAB — TSH: TSH: 2.01 u[IU]/mL (ref 0.450–4.500)

## 2016-08-14 LAB — BASIC METABOLIC PANEL
BUN/Creatinine Ratio: 19 (ref 12–28)
BUN: 23 mg/dL (ref 8–27)
CALCIUM: 9 mg/dL (ref 8.7–10.3)
CO2: 27 mmol/L (ref 18–29)
Chloride: 98 mmol/L (ref 96–106)
Creatinine, Ser: 1.19 mg/dL — ABNORMAL HIGH (ref 0.57–1.00)
GFR, EST AFRICAN AMERICAN: 48 mL/min/{1.73_m2} — AB (ref >59)
GFR, EST NON AFRICAN AMERICAN: 42 mL/min/{1.73_m2} — AB (ref >59)
Glucose: 87 mg/dL (ref 65–99)
Potassium: 3.5 mmol/L (ref 3.5–5.2)
Sodium: 140 mmol/L (ref 134–144)

## 2016-08-14 LAB — CBC
Hematocrit: 37.3 % (ref 34.0–46.6)
Hemoglobin: 12.3 g/dL (ref 11.1–15.9)
MCH: 29.4 pg (ref 26.6–33.0)
MCHC: 33 g/dL (ref 31.5–35.7)
MCV: 89 fL (ref 79–97)
Platelets: 216 10*3/uL (ref 150–379)
RBC: 4.18 x10E6/uL (ref 3.77–5.28)
RDW: 15.1 % (ref 12.3–15.4)
WBC: 4.8 10*3/uL (ref 3.4–10.8)

## 2016-08-14 LAB — APTT

## 2016-08-14 LAB — PROTIME-INR
INR: 2.6 — AB (ref 0.8–1.2)
PROTHROMBIN TIME: 25.8 s — AB (ref 9.1–12.0)

## 2016-08-15 LAB — APTT: APTT: 40 s — AB (ref 24–33)

## 2016-08-15 LAB — SPECIMEN STATUS REPORT

## 2016-08-16 DIAGNOSIS — Z45010 Encounter for checking and testing of cardiac pacemaker pulse generator [battery]: Secondary | ICD-10-CM

## 2016-08-18 ENCOUNTER — Other Ambulatory Visit: Payer: Self-pay | Admitting: Cardiovascular Disease

## 2016-08-25 ENCOUNTER — Other Ambulatory Visit: Payer: Self-pay | Admitting: Cardiovascular Disease

## 2016-08-25 ENCOUNTER — Ambulatory Visit (HOSPITAL_COMMUNITY)
Admission: RE | Admit: 2016-08-25 | Discharge: 2016-08-25 | Disposition: A | Discharge status: Home / Self Care | Payer: MEDICARE | Source: Ambulatory Visit | Attending: Cardiovascular Disease | Admitting: Cardiovascular Disease

## 2016-08-25 ENCOUNTER — Encounter (HOSPITAL_COMMUNITY)
Admission: RE | Disposition: A | Discharge status: Home / Self Care | Payer: Self-pay | Source: Ambulatory Visit | Attending: Cardiovascular Disease

## 2016-08-25 DIAGNOSIS — Z45010 Encounter for checking and testing of cardiac pacemaker pulse generator [battery]: Secondary | ICD-10-CM | POA: Diagnosis not present

## 2016-08-25 DIAGNOSIS — I495 Sick sinus syndrome: Secondary | ICD-10-CM | POA: Diagnosis not present

## 2016-08-25 DIAGNOSIS — E785 Hyperlipidemia, unspecified: Secondary | ICD-10-CM | POA: Insufficient documentation

## 2016-08-25 DIAGNOSIS — Z8774 Personal history of (corrected) congenital malformations of heart and circulatory system: Secondary | ICD-10-CM | POA: Insufficient documentation

## 2016-08-25 DIAGNOSIS — I480 Paroxysmal atrial fibrillation: Secondary | ICD-10-CM | POA: Diagnosis not present

## 2016-08-25 DIAGNOSIS — I272 Pulmonary hypertension, unspecified: Secondary | ICD-10-CM | POA: Diagnosis not present

## 2016-08-25 DIAGNOSIS — E039 Hypothyroidism, unspecified: Secondary | ICD-10-CM | POA: Insufficient documentation

## 2016-08-25 DIAGNOSIS — I10 Essential (primary) hypertension: Secondary | ICD-10-CM | POA: Diagnosis not present

## 2016-08-25 DIAGNOSIS — Z7901 Long term (current) use of anticoagulants: Secondary | ICD-10-CM | POA: Insufficient documentation

## 2016-08-25 HISTORY — PX: PPM GENERATOR CHANGEOUT: EP1233

## 2016-08-25 LAB — SURGICAL PCR SCREEN
MRSA, PCR: NEGATIVE
Staphylococcus aureus: NEGATIVE

## 2016-08-25 LAB — PROTIME-INR
INR: 2.49
PROTHROMBIN TIME: 27.4 s — AB (ref 11.4–15.2)

## 2016-08-25 SURGERY — PPM GENERATOR CHANGEOUT

## 2016-08-25 MED ORDER — MUPIROCIN 2 % EX OINT: 1.0000 "application " | TOPICAL_OINTMENT | Freq: Once | CUTANEOUS | Status: DC

## 2016-08-25 MED ORDER — CEFAZOLIN SODIUM-DEXTROSE 2-4 GM/100ML-% IV SOLN
2.0000 g | INTRAVENOUS | Status: AC
  Administered 2016-08-25: 2 g via INTRAVENOUS

## 2016-08-25 MED ORDER — SODIUM CHLORIDE 0.9 % IR SOLN
80.0000 mg | Status: AC
  Administered 2016-08-25: 80 mg

## 2016-08-25 MED ORDER — CHLORHEXIDINE GLUCONATE 4 % EX LIQD
60.0000 mL | Freq: Once | CUTANEOUS | Status: DC
Start: 2016-08-25 — End: 2016-08-25
  Filled 2016-08-25: qty 60

## 2016-08-25 MED ORDER — ACETAMINOPHEN 325 MG PO TABS: 325.0000 mg | ORAL_TABLET | ORAL | Status: DC | PRN

## 2016-08-25 MED ORDER — SODIUM CHLORIDE 0.9 % IR SOLN
Status: AC
  Filled 2016-08-25: qty 2

## 2016-08-25 MED ORDER — MUPIROCIN 2 % EX OINT
TOPICAL_OINTMENT | CUTANEOUS | Status: AC
  Administered 2016-08-25: 13:00:00
  Filled 2016-08-25: qty 22

## 2016-08-25 MED ORDER — SODIUM CHLORIDE 0.9 % IV SOLN
INTRAVENOUS | Status: DC
  Administered 2016-08-25: 13:00:00 via INTRAVENOUS

## 2016-08-25 MED ORDER — ONDANSETRON HCL 4 MG/2ML IJ SOLN: 4.0000 mg | Freq: Four times a day (QID) | INTRAMUSCULAR | Status: DC | PRN

## 2016-08-25 MED ORDER — LIDOCAINE HCL (PF) 1 % IJ SOLN
INTRAMUSCULAR | Status: AC
  Filled 2016-08-25: qty 60

## 2016-08-25 MED ORDER — CEFAZOLIN SODIUM-DEXTROSE 2-4 GM/100ML-% IV SOLN
INTRAVENOUS | Status: AC
  Filled 2016-08-25: qty 100

## 2016-08-25 MED ORDER — LIDOCAINE HCL (PF) 1 % IJ SOLN
INTRAMUSCULAR | Status: DC | PRN
Start: 2016-08-25 — End: 2016-08-25
  Administered 2016-08-25: 30 mL

## 2016-08-25 SURGICAL SUPPLY — 5 items
CABLE SURGICAL S-101-97-12 (CABLE) ×2 IMPLANT
IPG PACE AZUR XT DR MRI W1DR01 (Pacemaker) IMPLANT
PACE AZURE XT DR MRI W1DR01 (Pacemaker) ×3 IMPLANT
PAD DEFIB LIFELINK (PAD) ×2 IMPLANT
TRAY PACEMAKER INSERTION (PACKS) ×2 IMPLANT

## 2016-08-25 NOTE — H&P (View-Only) (Signed)
Cardiology Office Note    Date:  08/11/2016   ID:  [name], DOB 1929-01-08, MRN [id number]  PCP:  Kenn File, MD  Cardiologist:   Sanda Klein, MD   Chief Complaint  Patient presents with  . Follow-up    History of Present Illness:  [name] is a 83 y.o. female with tachycardia-bradycardia syndrome (sinus node dysfunction and paroxysmal atrial fibrillation) here for follow-up on her dual-chamber permanent pacemaker (Medtronic EnRhythm 2008). Her pacemaker reached elective replacement indicator on 07/06/2016. She is not pacemaker dependent.  Pacemaker was implanted in 2008 for sinus node dysfunction and tachycardia bradycardia syndrome. She has roughly 29% atrial pacing and 14% ventricular pacing. The burden of atrial fibrillation 6.8%. During the atrial fibrillation the ventricular rate control is excellent. The episodes last for anywhere from a few hours to about 24 hours Lead parameters appear to be excellent.  She has hypertension. She has a history of mitral valve annuloplasty and Cox Maze cryoablation procedure via minithoracotomy in 2009, has preserved left ventricular systolic function, no residual mitral insufficiency, normal coronary arteries at preoperative angiogram.  At times she is aware of the palpitations, but she denies shortness of breath, dizziness, syncope, focal neurological complaints. She is on chronic warfarin anticoagulation, regulated by her family practice office in [place]. She has not had any bleeding complications. Her most recent INR was in the upper range of desirable at 2.9.  Past Medical History:  Diagnosis Date  . A-fib ([place])   . Dyslipidemia   . Hypothyroidism    2 degree amidarone   . Pacemaker   . PAF (paroxysmal atrial fibrillation) ([place])   . S/P mitral valve repair   . SSS (sick sinus syndrome) ([place])   . Systemic hypertension     Past Surgical History:  Procedure Laterality Date  . ABDOMINAL HYSTERECTOMY  1989  .  CARDIAC CATHETERIZATION  01/11/2008   mild-mod pulm. hypertension,normal coronaries  . MINIMALLY INVASIVE MAZE PROCEDURE  02/2008  . MITRAL VALVE REPAIR  September 2009   edwards physio ring annuloplasty  . NM MYOVIEW LTD  03/28/2007   no ischemia  . PACEMAKER PLACEMENT  November 2008  . US ECHOCARDIOGRAPHY  03/07/2012   mild LVH,EF =>55%,mild MR,mod TR,trace AI,right atrium mod. dilated    Current Medications: Outpatient Medications Prior to Visit  Medication Sig Dispense Refill  . acetaminophen (TYLENOL) 500 MG tablet Take 500 mg by mouth every 4 (four) hours as needed.     . cyclobenzaprine (FLEXERIL) 10 MG tablet Take 10 mg by mouth 3 (three) times daily as needed for muscle spasms.    . metoprolol (LOPRESSOR) 50 MG tablet Take 1 tablet (50 mg total) by mouth 2 (two) times daily. 180 tablet 3  . risedronate (ACTONEL) 150 MG tablet Take 1 tablet (150 mg total) by mouth every 30 (thirty) days. with water on empty stomach, nothing by mouth or lie down for next 30 minutes. 3 tablet 4  . triamterene-hydrochlorothiazide (DYAZIDE) 37.5-25 MG capsule Take 1 each (1 capsule total) by mouth daily. 90 capsule 3  . warfarin (COUMADIN) 2.5 MG tablet TAKE 1 TO 2 TABLETS DAILY AS DIRECTED 180 tablet 1  . levothyroxine (SYNTHROID, LEVOTHROID) 25 MCG tablet Take 1 tablet (25 mcg total) by mouth daily. 30 tablet 2   No facility-administered medications prior to visit.      Allergies:   Statins   Social History   Social History  . Marital status: Married    Spouse name: N/A  .  Number of children: N/A  . Years of education: N/A   Social History Main Topics  . Smoking status: Never Smoker  . Smokeless tobacco: Never Used  . Alcohol use No  . Drug use: Unknown  . Sexual activity: Not Asked   Other Topics Concern  . None   Social History Narrative  . None     ROS:   Please see the history of present illness.    ROS All other systems reviewed and are negative.   PHYSICAL EXAM:   VS:   BP 122/80 (BP Location: Right Arm, Patient Position: Sitting, Cuff Size: Normal)   Pulse 67   Ht 5\' 8"  (1.727 m)   Wt 73.9 kg (163 lb)   BMI 24.78 kg/m    GEN: Well nourished, well developed, in no acute distress  HEENT: normal  Neck: no JVD, carotid bruits, or masses Cardiac: RRR; no murmurs, rubs, or gallops,no edema , healthy left subclavian pacemaker site Respiratory:  clear to auscultation bilaterally, normal work of breathing GI: soft, nontender, nondistended, + BS MS: no deformity or atrophy  Skin: warm and dry, no rash Neuro:  Alert and Oriented x 3, Strength and sensation are intact Psych: euthymic mood, full affect  Wt Readings from Last 3 Encounters:  08/11/16 73.9 kg (163 lb)  05/21/16 74.8 kg (165 lb)  02/03/16 73.9 kg (163 lb)      Studies/Labs Reviewed:   EKG:  EKG is ordered today.  The ekg ordered today demonstrates Atrial fibrillation, ventricular paced rhythm, mild T-wave inversion in leads V1-V3 (old), QTC 420 ms  Recent Labs: 05/21/2016: ALT 12; BUN 28; Creatinine, Ser 1.27; Platelets 193; Potassium 3.5; Sodium 141; TSH 2.360   Lipid Panel    Component Value Date/Time   CHOL 223 (H) 05/21/2016 0954   TRIG 120 05/21/2016 0954   HDL 52 05/21/2016 0954   CHOLHDL 4.3 05/21/2016 0954   CHOLHDL 4.8 12/26/2012 1057   VLDL 30 12/26/2012 1057   LDLCALC 147 (H) 05/21/2016 0954        ASSESSMENT:    1. Paroxysmal atrial fibrillation (HCC)   2. SSS (sick sinus syndrome) ([place])   3. Pacemaker battery depletion   4. Long term current use of anticoagulant   5. S/P mitral valve repair   6. Essential hypertension   7. Other fatigue   8. Pre-procedure lab exam      PLAN:  In order of problems listed above:  1. AFib: Burden of atrial fibrillation is relatively low and minimally symptomatic. Rate control is excellent. Continue warfarin anticoagulation. CHADSVasc 4 (age 56, HTN, gender). She does not have atrial fibrillation related to valvular (rheumatic)  heart disease. 2. SSS: Heart rate histogram distribution suggests adequate sensor settings. 3. PPM: at [facility].Marland Kitchen Discussed the procedure for generator change out. This procedure has been fully reviewed with the patient and written informed consent has been obtained. 4. Warfarin: Generator change-out can probably be done with continued warfarin anticoagulation, but would like to have the INR closer to 2.0 rather than 3.0. 5. S/P MV repair: Clinically no evidence of recurrence of mitral insufficiency, no audible murmur and no symptoms of shortness of breath. Last echocardiogram performed in 2013. 6. HTN: Excellent control.    Medication Adjustments/Labs and Tests Ordered: Current medicines are reviewed at length with the patient today.  Concerns regarding medicines are outlined above.  Medication changes, Labs and Tests ordered today are listed in the Patient Instructions below. Patient Instructions  Dr Sallyanne Kuster recommends that you  have your pacemaker battery changed out on Wednesday February 28th   , 2018. This will be performed at [place] will receive detailed instructions upon scheduling.  You will need to have blood work completed 7-14 days prior to the scheduled procedure date.  Pacemaker Battery Change A pacemaker battery usually lasts 4 to 12 years. Once or twice per year, you will be asked to visit your health care provider to have a full evaluation of your pacemaker. When a battery needs to be replaced, the entire pacemaker is replaced so that you can benefit from new circuitry and any new features that have been added to pacemakers. Most often, this procedure is very simple because the leads are already in place.  There are many things that affect how long a pacemaker battery will last, including:   The age of the pacemaker.   The number of leads (1, 2, or 3).   The pacemaker work load. If the pacemaker is helping the heart more often, the battery will not last as long  as it would if the pacemaker did not need to help the heart.   Power (voltage) settings. LET [facility] CARE PROVIDER KNOW ABOUT:   Any allergies you have.   All medicines you are taking, including vitamins, herbs, eye drops, creams, and over-the-counter medicines.   Previous problems you or members of your family have had with the use of anesthetics.   Any blood disorders you have.   Previous surgeries you have had, especially since your last pacemaker placement.   Medical conditions you have.   Possibility of pregnancy, if this applies.  Symptoms of chest pain, trouble breathing, palpitations, light-headedness, or feelings of an abnormal or irregular heartbeat. RISKS AND COMPLICATIONS  Generally, this is a safe procedure. However, as with any procedure, problems can occur and include:   Bleeding.   Bruising of the skin around where the incision was made.   Pain at the incision site.   Pulling apart of the skin at the incision site.   Infection.   Allergic reaction to anesthetics or other medicines used during the procedure.  People with diabetes may have a temporary increase in their blood sugar after any surgical procedure.  BEFORE THE PROCEDURE   Wash all of the skin around the area of the chest where the pacemaker is located.   Ask your health care provider for help with any medicine adjustments before the pacemaker is replaced.   Do not eat or drink anything after midnight on the night before the procedure or as directed by your health care provider.  Ask your health care provider if you can take a sip of water with any approved medicines the morning of the procedure. PROCEDURE   After giving medicine to numb the skin (local anesthetic), your health care provider will make a cut to reopen the pocket holding the pacemaker.   The old pacemaker will be disconnected from its leads.   The leads will be tested.   If needed, the leads will be  replaced. If the leads are functioning properly, the new pacemaker may be connected to the existing leads.  A heart monitor and the pacemaker programmer will be used to make sure that the new pacemaker is working properly.  The incision site will then be closed. A dressing will be placed over the pacemaker site. The dressing will be removed 24-48 hours afterward. AFTER THE PROCEDURE   You will be taken to a recovery area after  the new pacemaker implant is completed. Your vital signs such as blood pressure, heart rate, breathing, and oxygen levels will be monitored.  Your health care provider will tell you when you will need to next test your pacemaker or when to return to the office for follow-up for removal of stitches. This information is not intended to replace advice given to you by your health care provider. Make sure you discuss any questions you have with your health care provider. Document Released: 09/22/2006 Document Revised: 07/05/2014 Document Reviewed: 12/27/2012 Elsevier Interactive Patient Education  2017 [place], Sanda Klein, MD  08/11/2016 6:39 PM    [facility] [place], [place], [place]  [address] Phone: [phone]; Fax: [phone]

## 2016-08-25 NOTE — Interval H&P Note (Signed)
History and Physical Interval Note:  08/25/2016 12:54 PM  [name]  has presented today for surgery, with the diagnosis of eol  The various methods of treatment have been discussed with the patient and family. After consideration of risks, benefits and other options for treatment, the patient has consented to  Procedure(s): PPM Generator Changeout - Battery Only (N/A) as a surgical intervention .  The patient's history has been reviewed, patient examined, no change in status, stable for surgery.  I have reviewed the patient's chart and labs.  Questions were answered to the patient's satisfaction.     [name]

## 2016-08-25 NOTE — Progress Notes (Deleted)
Procedure cancelled per Dr. Fields due to resolution of problem. 

## 2016-08-25 NOTE — Op Note (Signed)
Procedure report  Procedure performed:  1. Dual chamber pacemaker generator changeout   Reason for procedure:  1. Device generator at elective replacement interval  2. Sinus node dysfunction and tachy-brady sd. Procedure performed by:  Sanda Klein, MD  Complications:  None  Estimated blood loss:  <5 mL  Medications administered during procedure:  Ancef 2 g intravenously, lidocaine 1% 30 mL locally  Device details:   New Generator Medtronic Azure model number [id number], serial number [id number] Right atrial lead (chronic) Medtronic [id number], serial number [id number] (implanted 05/24/2007) Right ventricular lead (chronic)  Medtronic [id number], serial number [id number] (implanted 05/24/2007)  Explanted generator Medtronic EnRhythm,  model number [id number], serial number  [id number] (implanted 05/24/2007)  Procedure details:  After the risks and benefits of the procedure were discussed the patient provided informed consent. She was brought to the cardiac catheter lab in the fasting state. The patient was prepped and draped in usual sterile fashion. Local anesthesia with 1% lidocaine was administered to to the left infraclavicular area. A 5-6cm horizontal incision was made parallel with and 2-3 cm caudal to the left clavicle, in the area of an old scar. An older scar was seen closer to the left clavicle. Using minimal electrocautery and mostly sharp and blunt dissection the prepectoral pocket was opened carefully to avoid injury to the loops of chronic leads. Extensive dissection was not necessary. The device was explanted. The pocket was carefully inspected for hemostasis and flushed with copious amounts of antibiotic solution.  The leads were disconnected from the old generator and testing of the lead parameters later showed mediocre, but acceptable values on the atrial lead, excellent values of the ventricular lead. The new generator was connected to the chronic leads, with appropriate pacing  noted.   The entire system was then carefully inserted in the pocket with care been taking that the leads and device assumed a comfortable position without pressure on the incision. Great care was taken that the leads be located deep to the generator. The pocket was then closed in layers using 2 layers of 2-0 Vicryl and cutaneous staples after which a sterile dressing was applied.   At the end of the procedure the following lead parameters were encountered:   Right atrial lead sensed P waves 0.2-0.4 mV, impedance 440 ohms, threshold 1.7V at 0.5 ms pulse width (1.4V at 1.0 ms).  Right ventricular lead sensed R waves  8.7 mV, impedance 443 ohms, threshold 0.6 at 1.0 ms pulse width.   Sanda Klein, MD, [facility] [phone] office [phone] pager

## 2016-08-25 NOTE — Discharge Instructions (Signed)

## 2016-08-26 ENCOUNTER — Encounter (HOSPITAL_COMMUNITY): Payer: Self-pay | Admitting: Cardiovascular Disease

## 2016-08-30 ENCOUNTER — Other Ambulatory Visit: Payer: Self-pay | Admitting: Pharmacist Clinician (PhC)/ Clinical Pharmacy Specialist

## 2016-08-30 MED ORDER — WARFARIN SODIUM 2.5 MG PO TABS: ORAL_TABLET | ORAL | 0 refills | Status: DC

## 2016-09-03 ENCOUNTER — Ambulatory Visit (INDEPENDENT_AMBULATORY_CARE_PROVIDER_SITE_OTHER): Payer: MEDICARE | Admitting: Pharmacist

## 2016-09-03 DIAGNOSIS — I480 Paroxysmal atrial fibrillation: Secondary | ICD-10-CM | POA: Diagnosis not present

## 2016-09-03 DIAGNOSIS — I482 Chronic atrial fibrillation, unspecified: Secondary | ICD-10-CM

## 2016-09-03 LAB — COAGUCHEK XS/INR WAIVED
INR: 2.9 — AB (ref 0.9–1.1)
PROTHROMBIN TIME: 34.4 s

## 2016-09-03 NOTE — Patient Instructions (Signed)
Anticoagulation Dose Instructions as of 09/03/2016      Dorene Grebe Tue Wed Thu Fri Sat   New Dose 5 mg 2.5 mg 5 mg 2.5 mg 5 mg 2.5 mg 5 mg    Description   Continue taking warfarin the same way as listed above.  INR today is 2.9 (goal: 2-3)

## 2016-09-08 ENCOUNTER — Ambulatory Visit (INDEPENDENT_AMBULATORY_CARE_PROVIDER_SITE_OTHER): Payer: MEDICARE | Admitting: *Deleted

## 2016-09-08 DIAGNOSIS — I495 Sick sinus syndrome: Secondary | ICD-10-CM | POA: Diagnosis not present

## 2016-09-08 LAB — CUP PACEART INCLINIC DEVICE CHECK
Brady Statistic AP VP Percent: 3.27 %
Brady Statistic AP VS Percent: 47.1 %
Brady Statistic AS VP Percent: 4.97 %
Brady Statistic RV Percent Paced: 11.1 %
Implantable Lead Implant Date: 20081126
Implantable Lead Location: 753860
Implantable Lead Model: 5076
Lead Channel Impedance Value: 323 Ohm
Lead Channel Impedance Value: 361 Ohm
Lead Channel Pacing Threshold Pulse Width: 0.4 ms
Lead Channel Sensing Intrinsic Amplitude: 0.375 mV
Lead Channel Sensing Intrinsic Amplitude: 7 mV
Lead Channel Sensing Intrinsic Amplitude: 8.375 mV
Lead Channel Setting Pacing Amplitude: 2.5 V
Lead Channel Setting Pacing Amplitude: 2.75 V
Lead Channel Setting Sensing Sensitivity: 2 mV
MDC IDC LEAD IMPLANT DT: 20081126
MDC IDC LEAD LOCATION: 753859
MDC IDC MSMT BATTERY REMAINING LONGEVITY: 124 mo
MDC IDC MSMT BATTERY VOLTAGE: 3.21 V
MDC IDC MSMT LEADCHNL RA IMPEDANCE VALUE: 342 Ohm
MDC IDC MSMT LEADCHNL RA IMPEDANCE VALUE: 399 Ohm
MDC IDC MSMT LEADCHNL RA SENSING INTR AMPL: 0.375 mV
MDC IDC MSMT LEADCHNL RV PACING THRESHOLD AMPLITUDE: 0.75 V
MDC IDC PG IMPLANT DT: 20180228
MDC IDC SESS DTM: 20180314115657
MDC IDC SET LEADCHNL RV PACING PULSEWIDTH: 0.4 ms
MDC IDC STAT BRADY AS VS PERCENT: 44.63 %
MDC IDC STAT BRADY RA PERCENT PACED: 48.21 %

## 2016-09-08 NOTE — Progress Notes (Signed)
Wound check appointment. 5 staples present. 2nd and 4th staple removed. Wound without redness. Edema surrounding device with echymosis. Soft to palpation and pain free. Incision edges approximated, wound well healed. Normal device function. Thresholds, sensing, and impedances consistent with implant measurements. Device programed to chronic values s/p gen change. Histogram distribution appropriate for patient and level of activity. 315 mode switches (5.9%) + coumadin. No high ventricular rates noted. ROV with DC in 1wk to remove remaining staples.

## 2016-09-16 ENCOUNTER — Ambulatory Visit (INDEPENDENT_AMBULATORY_CARE_PROVIDER_SITE_OTHER): Payer: MEDICARE | Admitting: *Deleted

## 2016-09-16 DIAGNOSIS — I495 Sick sinus syndrome: Secondary | ICD-10-CM

## 2016-09-16 DIAGNOSIS — Z950 Presence of cardiac pacemaker: Secondary | ICD-10-CM

## 2016-09-16 NOTE — Progress Notes (Signed)
Device Clinic appt for staple removal s/p PPM generator replacement 08/25/16. 3 remaining staples removed. Wound well healed. Incision edges approximated without redness, swelling or drainage. Ecchymosis appears to be improving. Pt educated about wound care and knows to call if any redness, swelling or drainage occurs at the device site. Pt aware of f/u with Dr. Sallyanne Kuster 12/01/16.

## 2016-10-15 ENCOUNTER — Ambulatory Visit (INDEPENDENT_AMBULATORY_CARE_PROVIDER_SITE_OTHER): Payer: MEDICARE | Admitting: Pharmacist

## 2016-10-15 DIAGNOSIS — I482 Chronic atrial fibrillation, unspecified: Secondary | ICD-10-CM

## 2016-10-15 DIAGNOSIS — I480 Paroxysmal atrial fibrillation: Secondary | ICD-10-CM | POA: Diagnosis not present

## 2016-10-15 LAB — COAGUCHEK XS/INR WAIVED
INR: 3.2 — AB (ref 0.9–1.1)
Prothrombin Time: 38.3 s

## 2016-10-15 NOTE — Patient Instructions (Signed)
Anticoagulation Dose Instructions as of 10/15/2016      Dorene Grebe Tue Wed Thu Fri Sat   New Dose 5 mg 2.5 mg 5 mg 2.5 mg 5 mg 2.5 mg 5 mg    Description   Skip dose today (4/20) then continue taking warfarin the same way as listed above. Start eating more leafy green Vitamin K rich foods next week.  INR today is 3.2 (goal: 2-3) "a little thin today"

## 2016-11-05 ENCOUNTER — Ambulatory Visit (INDEPENDENT_AMBULATORY_CARE_PROVIDER_SITE_OTHER): Payer: MEDICARE | Admitting: Pharmacist

## 2016-11-05 DIAGNOSIS — I482 Chronic atrial fibrillation, unspecified: Secondary | ICD-10-CM

## 2016-11-05 DIAGNOSIS — I480 Paroxysmal atrial fibrillation: Secondary | ICD-10-CM | POA: Diagnosis not present

## 2016-11-05 LAB — COAGUCHEK XS/INR WAIVED
INR: 3.6 — AB (ref 0.9–1.1)
PROTHROMBIN TIME: 42.8 s

## 2016-11-05 NOTE — Patient Instructions (Signed)
Anticoagulation Warfarin Dose Instructions as of 11/05/2016      [name] Tue Wed Thu Fri Sat   New Dose 5 mg 2.5 mg 5 mg 2.5 mg 2.5 mg 2.5 mg 5 mg    Description   Skip dose today (5/11), and only take 1 tablet tomorrow (5/12). Then start taking 1 tablet on Monday, Wednesday, Thursday, and Friday. Take 2 tablets on Sunday, Tuesday, and Saturday. Try eating more leafy green Vitamin K rich foods.  INR today is 3.6 (goal: 2-3) "blood is thin today"

## 2016-11-19 ENCOUNTER — Ambulatory Visit (INDEPENDENT_AMBULATORY_CARE_PROVIDER_SITE_OTHER): Payer: MEDICARE | Admitting: Pharmacist

## 2016-11-19 DIAGNOSIS — I480 Paroxysmal atrial fibrillation: Secondary | ICD-10-CM | POA: Diagnosis not present

## 2016-11-19 LAB — COAGUCHEK XS/INR WAIVED
INR: 2.5 — ABNORMAL HIGH (ref 0.9–1.1)
PROTHROMBIN TIME: 29.4 s

## 2016-12-01 ENCOUNTER — Encounter: Payer: Self-pay | Admitting: Cardiovascular Disease

## 2016-12-01 ENCOUNTER — Ambulatory Visit (INDEPENDENT_AMBULATORY_CARE_PROVIDER_SITE_OTHER): Payer: MEDICARE | Admitting: Cardiovascular Disease

## 2016-12-01 VITALS — BP 120/80 | HR 65 | Ht 68.0 in | Wt 167.8 lb

## 2016-12-01 DIAGNOSIS — I480 Paroxysmal atrial fibrillation: Secondary | ICD-10-CM

## 2016-12-01 DIAGNOSIS — Z9889 Other specified postprocedural states: Secondary | ICD-10-CM

## 2016-12-01 DIAGNOSIS — Z7901 Long term (current) use of anticoagulants: Secondary | ICD-10-CM | POA: Diagnosis not present

## 2016-12-01 DIAGNOSIS — I10 Essential (primary) hypertension: Secondary | ICD-10-CM

## 2016-12-01 DIAGNOSIS — Z950 Presence of cardiac pacemaker: Secondary | ICD-10-CM | POA: Diagnosis not present

## 2016-12-01 DIAGNOSIS — I495 Sick sinus syndrome: Secondary | ICD-10-CM | POA: Diagnosis not present

## 2016-12-01 NOTE — Progress Notes (Signed)
Cardiology Office Note    Date:  12/01/2016   ID:  [name], DOB [birth year], MRN [id number]  PCP:  Lorene Dy, MD  Cardiologist:   Sanda Klein, MD   Chief Complaint  Patient presents with  . Follow-up    History of Present Illness:  [name] is a 83 y.o. female with tachycardia-bradycardia syndrome (sinus node dysfunction and paroxysmal atrial fibrillation) here for follow-up on her dual-chamber permanent pacemaker (Initial device 2008, Medtronic Azure XT generator changeout February 2018). . She is not pacemaker dependent.   She has roughly 48% atrial pacing and 34% ventricular pacing. The burden of atrial fibrillation is a little higher than previous average, now 17%. As before her episodes last for a few hours or a few days and do not appear to be symptomatic. At most she feels occasional palpitations. Rate control remains excellent. She is in atrial fibrillation today, since early this morning. She appears to be unaware of the arrhythmia.  The patient specifically denies any chest pain at rest exertion, dyspnea at rest or with exertion, orthopnea, paroxysmal nocturnal dyspnea, syncope, focal neurological deficits, intermittent claudication, lower extremity edema, unexplained weight gain, cough, hemoptysis or wheezing.  Anticoagulation is managed by her primary care provider in [place]. Recently she has had some volatile prothrombin times, mostly with INRs that are in the supratherapeutic range. She expresses some frustration with anticoagulation adjustments.  She has hypertension. She has a history of mitral valve annuloplasty and Cox Maze cryoablation procedure via minithoracotomy in 2009, has preserved left ventricular systolic function, no residual mitral insufficiency, normal coronary arteries at preoperative angiogram.   Past Medical History:  Diagnosis Date  . A-fib ([place])   . Dyslipidemia   . Hypothyroidism    2 degree amidarone   . Pacemaker   . PAF  (paroxysmal atrial fibrillation) ([place])   . S/P mitral valve repair   . SSS (sick sinus syndrome) ([place])   . Systemic hypertension     Past Surgical History:  Procedure Laterality Date  . ABDOMINAL HYSTERECTOMY  1989  . CARDIAC CATHETERIZATION  01/11/2008   mild-mod pulm. hypertension,normal coronaries  . MINIMALLY INVASIVE MAZE PROCEDURE  02/2008  . MITRAL VALVE REPAIR  September 2009   [facility] ring annuloplasty  . NM MYOVIEW LTD  03/28/2007   no ischemia  . PACEMAKER PLACEMENT  November 2008  . PPM GENERATOR CHANGEOUT N/A 08/25/2016   Procedure: PPM Generator Changeout - Battery Only;  Surgeon: Sanda Klein, MD;  Location: [facility];  Service: Cardiovascular;  Laterality: N/A;  . US ECHOCARDIOGRAPHY  03/07/2012   mild LVH,EF =>55%,mild MR,mod TR,trace AI,right atrium mod. dilated    Current Medications: Outpatient Medications Prior to Visit  Medication Sig Dispense Refill  . acetaminophen (TYLENOL) 500 MG tablet Take 500 mg by mouth daily as needed for headache.     . levothyroxine (SYNTHROID, LEVOTHROID) 25 MCG tablet Take 1 tablet (25 mcg total) by mouth daily. 30 tablet 2  . metoprolol (LOPRESSOR) 50 MG tablet Take 1 tablet (50 mg total) by mouth 2 (two) times daily. 180 tablet 3  . Polyethyl Glycol-Propyl Glycol (SYSTANE OP) Apply 1 drop to eye daily as needed (dry eyes).    . Probiotic Product (PROBIOTIC PO) Take 2 tablets by mouth daily.    . risedronate (ACTONEL) 150 MG tablet Take 1 tablet (150 mg total) by mouth every 30 (thirty) days. with water on empty stomach, nothing by mouth or lie down for next 30 minutes.  3 tablet 4  . triamterene-hydrochlorothiazide (DYAZIDE) 37.5-25 MG capsule Take 1 each (1 capsule total) by mouth daily. 90 capsule 3  . warfarin (COUMADIN) 2.5 MG tablet TAKE 1 TO 2 TABLETS DAILY AS DIRECTED 150 tablet 0   No facility-administered medications prior to visit.      Allergies:   Statins   Social History   Social History  .  Marital status: Married    Spouse name: N/A  . Number of children: N/A  . Years of education: N/A   Social History Main Topics  . Smoking status: Never Smoker  . Smokeless tobacco: Never Used  . Alcohol use No  . Drug use: Unknown  . Sexual activity: Not on file   Other Topics Concern  . Not on file   Social History Narrative  . No narrative on file     ROS:   Please see the history of present illness.    ROS All other systems reviewed and are negative.   PHYSICAL EXAM:   VS:  BP 120/80   Pulse 65   Ht 5\' 8"  (1.727 m)   Wt 167 lb 12.8 oz (76.1 kg)   BMI 25.51 kg/m     General: Well nourished, well developed,Alert, oriented x3, no distress Head: no evidence of trauma, PERRL, EOMI, no exophtalmos or lid lag, no myxedema, no xanthelasma; normal ears, nose and oropharynx Neck: normal jugular venous pulsations and no hepatojugular reflux; brisk carotid pulses without delay and no carotid bruits Chest: clear to auscultation, no signs of consolidation by percussion or palpation, normal fremitus, symmetrical and full respiratory excursions. Healthy left subclavian pacemaker site, healthy scar Cardiovascular: normal position and quality of the apical impulse, regular rhythm, normal first and second heart sounds, early peaking 1-2/6 aortic ejection murmur , no apical murmurs and no diastolic murmurs, rubs or gallops Abdomen: no tenderness or distention, no masses by palpation, no abnormal pulsatility or arterial bruits, normal bowel sounds, no hepatosplenomegaly Extremities: no clubbing, cyanosis or edema; 2+ radial, ulnar and brachial pulses bilaterally; 2+ right femoral, posterior tibial and dorsalis pedis pulses; 2+ left femoral, posterior tibial and dorsalis pedis pulses; no subclavian or femoral bruits Neurological: grossly nonfocal Psych: euthymic mood, full affect  Wt Readings from Last 3 Encounters:  12/01/16 167 lb 12.8 oz (76.1 kg)  08/25/16 163 lb (73.9 kg)  08/11/16 163  lb (73.9 kg)      Studies/Labs Reviewed:   EKG:  EKG is ordered today.  It shows atrial fibrillation with 100% ventricular pacing. The paced QRS is brought at 170 ms, QTC 476 ms.  Recent Labs: 05/21/2016: ALT 12 08/13/2016: BUN 23; Creatinine, Ser 1.19; Hemoglobin 12.3; Platelets 216; Potassium 3.5; Sodium 140; TSH 2.010   Lipid Panel    Component Value Date/Time   CHOL 223 (H) 05/21/2016 0954   TRIG 120 05/21/2016 0954   HDL 52 05/21/2016 0954   CHOLHDL 4.3 05/21/2016 0954   CHOLHDL 4.8 12/26/2012 1057   VLDL 30 12/26/2012 1057   LDLCALC 147 (H) 05/21/2016 0954   ASSESSMENT:    1. Paroxysmal atrial fibrillation (HCC)   2. Tachy-brady syndrome ([place])   3. Pacemaker   4. Long term current use of anticoagulant   5. S/P mitral valve repair   6. Essential hypertension      PLAN:  In order of problems listed above:  1. AFib: Burden of atrial fibrillation is a little higher at 17%, compared with previous average around 6-7 percent, but it has been a  relatively short period since her generator change out. She remains minimally symptomatic. Rate control is excellent. Continue warfarin anticoagulation. CHADSVasc 4 (age 7, HTN, gender). She does not have atrial fibrillation related to valvular (rheumatic) heart disease. 2. SSS with tachy-bradycardia syndrome: Satisfactory heart rate histogram distribution 3. PPM: Well-healed after generator change. Continue remote downloads every 3 months and yearly office visit. 4. Warfarin: She has had some trouble maintaining INR therapeutic range. We discussed the fact that she can choose to switch to an oral anticoagulants such as a Eliquis. She expressed concern about bleeding risks. I advised her that these medications actually safer than warfarin, especially if her prothrombin time is volatile. She will think about it. 5. S/P MV repair: Last echocardiogram performed in 2013. She does not have an audible murmur of mitral insufficiency. No signs of  heart failure. 6. HTN: Well controlled.    Medication Adjustments/Labs and Tests Ordered: Current medicines are reviewed at length with the patient today.  Concerns regarding medicines are outlined above.  Medication changes, Labs and Tests ordered today are listed in the Patient Instructions below. Patient Instructions  Dr Sallyanne Kuster recommends that you continue on your current medications as directed. Please refer to the Current Medication list given to you today.  Remote monitoring is used to monitor your Pacemaker or ICD from home. This monitoring reduces the number of office visits required to check your device to one time per year. It allows [place] to keep an eye on the functioning of your device to ensure it is working properly. You are scheduled for a device check from home on Wednesday, September 5th, 2018. You may send your transmission at any time that day. If you have a wireless device, the transmission will be sent automatically. After your physician reviews your transmission, you will receive a notification with your next transmission date.  Dr Sallyanne Kuster recommends that you schedule a follow-up appointment in 12 months with a pacemaker check. You will receive a reminder letter in the mail two months in advance. If you don't receive a letter, please call our office to schedule the follow-up appointment.  If you need a refill on your cardiac medications before your next appointment, please call your pharmacy.    Signed, Sanda Klein, MD  12/01/2016 1:27 PM    [facility] [address], [place], [place]  [address] Phone: [phone]; Fax: [phone]

## 2016-12-01 NOTE — Patient Instructions (Signed)
Dr Croitoru recommends that you continue on your current medications as directed. Please refer to the Current Medication list given to you today.  Remote monitoring is used to monitor your Pacemaker or ICD from home. This monitoring reduces the number of office visits required to check your device to one time per year. It allows us to keep an eye on the functioning of your device to ensure it is working properly. You are scheduled for a device check from home on Wednesday, September 5th, 2018. You may send your transmission at any time that day. If you have a wireless device, the transmission will be sent automatically. After your physician reviews your transmission, you will receive a notification with your next transmission date.  Dr Croitoru recommends that you schedule a follow-up appointment in 12 months with a pacemaker check. You will receive a reminder letter in the mail two months in advance. If you don't receive a letter, please call our office to schedule the follow-up appointment.  If you need a refill on your cardiac medications before your next appointment, please call your pharmacy. 

## 2016-12-07 ENCOUNTER — Other Ambulatory Visit: Payer: Self-pay | Admitting: Cardiovascular Disease

## 2016-12-09 LAB — CUP PACEART INCLINIC DEVICE CHECK
Battery Remaining Longevity: 115 mo
Date Time Interrogation Session: 20180614101432
Implantable Lead Implant Date: 20081126
Implantable Pulse Generator Implant Date: 20180228
Lead Channel Pacing Threshold Amplitude: 1 V
Lead Channel Sensing Intrinsic Amplitude: 11 mV
Lead Channel Setting Sensing Sensitivity: 2 mV
MDC IDC LEAD IMPLANT DT: 20081126
MDC IDC LEAD LOCATION: 753859
MDC IDC LEAD LOCATION: 753860
MDC IDC MSMT LEADCHNL RA IMPEDANCE VALUE: 437 Ohm
MDC IDC MSMT LEADCHNL RA SENSING INTR AMPL: 0.8 mV
MDC IDC MSMT LEADCHNL RV IMPEDANCE VALUE: 399 Ohm
MDC IDC MSMT LEADCHNL RV PACING THRESHOLD PULSEWIDTH: 0.4 ms
MDC IDC SET LEADCHNL RA PACING AMPLITUDE: 2.75 V
MDC IDC SET LEADCHNL RV PACING AMPLITUDE: 2.5 V
MDC IDC SET LEADCHNL RV PACING PULSEWIDTH: 0.4 ms
MDC IDC STAT BRADY RA PERCENT PACED: 47.5 %
MDC IDC STAT BRADY RV PERCENT PACED: 34.1 %

## 2016-12-17 ENCOUNTER — Ambulatory Visit (INDEPENDENT_AMBULATORY_CARE_PROVIDER_SITE_OTHER): Payer: MEDICARE | Admitting: Pharmacist

## 2016-12-17 ENCOUNTER — Encounter: Payer: MEDICARE | Admitting: Pharmacist Clinician (PhC)/ Clinical Pharmacy Specialist

## 2016-12-17 DIAGNOSIS — I482 Chronic atrial fibrillation, unspecified: Secondary | ICD-10-CM

## 2016-12-17 DIAGNOSIS — I480 Paroxysmal atrial fibrillation: Secondary | ICD-10-CM | POA: Diagnosis not present

## 2016-12-17 LAB — COAGUCHEK XS/INR WAIVED
INR: 2.2 — AB (ref 0.9–1.1)
PROTHROMBIN TIME: 26 s

## 2016-12-17 NOTE — Patient Instructions (Signed)
Anticoagulation Warfarin Dose Instructions as of 12/17/2016      [name] Tue Wed Thu Fri Sat   New Dose 5 mg 2.5 mg 5 mg 2.5 mg 2.5 mg 2.5 mg 5 mg    Description   Continue taking 1 tablet on Monday, Wednesday, Thursday, and Friday. Take 2 tablets on Sunday, Tuesday, and Saturday.   INR today is 2.2 (goal: 2-3)

## 2017-01-28 ENCOUNTER — Ambulatory Visit (INDEPENDENT_AMBULATORY_CARE_PROVIDER_SITE_OTHER): Payer: MEDICARE | Admitting: Pharmacist Clinician (PhC)/ Clinical Pharmacy Specialist

## 2017-01-28 DIAGNOSIS — I482 Chronic atrial fibrillation, unspecified: Secondary | ICD-10-CM

## 2017-01-28 DIAGNOSIS — I480 Paroxysmal atrial fibrillation: Secondary | ICD-10-CM

## 2017-01-28 LAB — COAGUCHEK XS/INR WAIVED
INR: 3.4 — ABNORMAL HIGH (ref 0.9–1.1)
PROTHROMBIN TIME: 41.2 s

## 2017-01-28 NOTE — Patient Instructions (Signed)
Anticoagulation Warfarin Dose Instructions as of 01/28/2017      [name] Tue Wed Thu Fri Sat   New Dose 5 mg 2.5 mg 2.5 mg 5 mg 2.5 mg 2.5 mg 2.5 mg    Description   Take only 1/2 tablet today then change to taking 1 tablet on Monday, Wednesday, Thursday, Friday, and Saturday. Take 2 tablets on Sundays and Wednesdays.  INR today is 3.4  (goal: 2-3)  A little thin today

## 2017-02-09 ENCOUNTER — Other Ambulatory Visit: Payer: Self-pay | Admitting: Cardiovascular Disease

## 2017-02-18 ENCOUNTER — Ambulatory Visit (INDEPENDENT_AMBULATORY_CARE_PROVIDER_SITE_OTHER): Payer: MEDICARE | Admitting: Pharmacist Clinician (PhC)/ Clinical Pharmacy Specialist

## 2017-02-18 DIAGNOSIS — I482 Chronic atrial fibrillation, unspecified: Secondary | ICD-10-CM

## 2017-02-18 DIAGNOSIS — I480 Paroxysmal atrial fibrillation: Secondary | ICD-10-CM

## 2017-02-18 LAB — COAGUCHEK XS/INR WAIVED
INR: 2.9 — ABNORMAL HIGH (ref 0.9–1.1)
Prothrombin Time: 34.5 s

## 2017-02-18 NOTE — Patient Instructions (Signed)
Anticoagulation Warfarin Dose Instructions as of 02/18/2017      Dorene Grebe Tue Wed Thu Fri Sat   New Dose 5 mg 2.5 mg 2.5 mg 5 mg 2.5 mg 2.5 mg 2.5 mg    Description   Continue taking warfarin the same way as listed above  INR today is 2.9 (goal: 2-3)

## 2017-02-23 ENCOUNTER — Other Ambulatory Visit: Payer: Self-pay | Admitting: Cardiovascular Disease

## 2017-02-23 NOTE — Telephone Encounter (Signed)
Please refill warfarin as indicated

## 2017-02-25 ENCOUNTER — Other Ambulatory Visit: Payer: Self-pay | Admitting: Pharmacist Clinician (PhC)/ Clinical Pharmacy Specialist

## 2017-02-25 MED ORDER — WARFARIN SODIUM 2.5 MG PO TABS: ORAL_TABLET | ORAL | 0 refills | Status: DC

## 2017-03-02 ENCOUNTER — Ambulatory Visit (INDEPENDENT_AMBULATORY_CARE_PROVIDER_SITE_OTHER): Payer: MEDICARE | Admitting: *Deleted

## 2017-03-02 DIAGNOSIS — I495 Sick sinus syndrome: Secondary | ICD-10-CM

## 2017-03-03 NOTE — Progress Notes (Signed)
Remote pacemaker transmission.   

## 2017-03-08 ENCOUNTER — Encounter: Payer: Self-pay | Admitting: Cardiology

## 2017-03-15 LAB — CUP PACEART REMOTE DEVICE CHECK
Battery Remaining Longevity: 117 mo
Brady Statistic AP VP Percent: 9.37 %
Brady Statistic AP VS Percent: 49.66 %
Brady Statistic AS VP Percent: 14.5 %
Implantable Lead Implant Date: 20081126
Implantable Lead Location: 753859
Implantable Lead Location: 753860
Implantable Lead Model: 5076
Implantable Lead Model: 5076
Lead Channel Impedance Value: 361 Ohm
Lead Channel Impedance Value: 361 Ohm
Lead Channel Impedance Value: 399 Ohm
Lead Channel Pacing Threshold Amplitude: 0.75 V
Lead Channel Pacing Threshold Pulse Width: 0.4 ms
Lead Channel Sensing Intrinsic Amplitude: 7.5 mV
Lead Channel Setting Pacing Amplitude: 2.5 V
Lead Channel Setting Pacing Amplitude: 2.75 V
Lead Channel Setting Pacing Pulse Width: 0.4 ms
MDC IDC LEAD IMPLANT DT: 20081126
MDC IDC MSMT BATTERY VOLTAGE: 3.11 V
MDC IDC MSMT LEADCHNL RA SENSING INTR AMPL: 0.5 mV
MDC IDC MSMT LEADCHNL RA SENSING INTR AMPL: 0.5 mV
MDC IDC MSMT LEADCHNL RV IMPEDANCE VALUE: 399 Ohm
MDC IDC MSMT LEADCHNL RV SENSING INTR AMPL: 7.5 mV
MDC IDC PG IMPLANT DT: 20180228
MDC IDC SESS DTM: 20180905052900
MDC IDC SET LEADCHNL RV SENSING SENSITIVITY: 2 mV
MDC IDC STAT BRADY AS VS PERCENT: 26.27 %
MDC IDC STAT BRADY RA PERCENT PACED: 22.63 %
MDC IDC STAT BRADY RV PERCENT PACED: 59.42 %

## 2017-03-30 ENCOUNTER — Telehealth: Payer: Self-pay | Admitting: Cardiovascular Disease

## 2017-03-30 MED ORDER — METOPROLOL TARTRATE 50 MG PO TABS: 50.0000 mg | ORAL_TABLET | Freq: Two times a day (BID) | ORAL | 1 refills | Status: DC

## 2017-03-30 MED ORDER — TRIAMTERENE-HCTZ 37.5-25 MG PO CAPS: 1.0000 | ORAL_CAPSULE | Freq: Every day | ORAL | 1 refills | Status: DC

## 2017-03-30 NOTE — Telephone Encounter (Signed)
REFILLS SENT AS REQUESTED MAIL ORDER

## 2017-03-30 NOTE — Telephone Encounter (Signed)
°  New Prob   *STAT* If patient is at the pharmacy, call can be transferred to refill team.   1. Which medications need to be refilled? (please list name of each medication and dose if known) Lopressor 50 mg twice daily Dyazide 37.5-25 mg once daily  2. Which pharmacy/location (including street and city if local pharmacy) is medication to be sent to? [facility]  3. Do they need a 30 day or 90 day supply? 30   Pt is requesting to speak to a refill representative.

## 2017-04-01 ENCOUNTER — Ambulatory Visit (INDEPENDENT_AMBULATORY_CARE_PROVIDER_SITE_OTHER): Payer: MEDICARE | Admitting: Pharmacist Clinician (PhC)/ Clinical Pharmacy Specialist

## 2017-04-01 DIAGNOSIS — I482 Chronic atrial fibrillation, unspecified: Secondary | ICD-10-CM

## 2017-04-01 DIAGNOSIS — Z23 Encounter for immunization: Secondary | ICD-10-CM | POA: Diagnosis not present

## 2017-04-01 DIAGNOSIS — I480 Paroxysmal atrial fibrillation: Secondary | ICD-10-CM | POA: Diagnosis not present

## 2017-04-01 LAB — COAGUCHEK XS/INR WAIVED
INR: 2.9 — ABNORMAL HIGH (ref 0.9–1.1)
PROTHROMBIN TIME: 35.2 s

## 2017-04-01 NOTE — Patient Instructions (Signed)
Anticoagulation Warfarin Dose Instructions as of 04/01/2017      [name] Tue Wed Thu Fri Sat   New Dose 5 mg 2.5 mg 2.5 mg 5 mg 2.5 mg 2.5 mg 2.5 mg    Description   Continue taking warfarin the same way as listed above  INR today is 2.9 (goal: 2-3)

## 2017-04-12 DIAGNOSIS — Z961 Presence of intraocular lens: Secondary | ICD-10-CM | POA: Diagnosis not present

## 2017-04-12 DIAGNOSIS — H40013 Open angle with borderline findings, low risk, bilateral: Secondary | ICD-10-CM | POA: Diagnosis not present

## 2017-04-19 DIAGNOSIS — D497 Neoplasm of unspecified behavior of endocrine glands and other parts of nervous system: Secondary | ICD-10-CM | POA: Diagnosis not present

## 2017-05-27 ENCOUNTER — Ambulatory Visit (INDEPENDENT_AMBULATORY_CARE_PROVIDER_SITE_OTHER): Payer: MEDICARE | Admitting: Pharmacist Clinician (PhC)/ Clinical Pharmacy Specialist

## 2017-05-27 DIAGNOSIS — I482 Chronic atrial fibrillation, unspecified: Secondary | ICD-10-CM

## 2017-05-27 DIAGNOSIS — I480 Paroxysmal atrial fibrillation: Secondary | ICD-10-CM | POA: Diagnosis not present

## 2017-05-27 LAB — COAGUCHEK XS/INR WAIVED
INR: 3.2 — ABNORMAL HIGH (ref 0.9–1.1)
Prothrombin Time: 38.4 s

## 2017-05-27 NOTE — Patient Instructions (Signed)
Description   Change to taking 1 tablet every day of the week  INR today is 3.2 (goal: 2-3)

## 2017-06-01 ENCOUNTER — Ambulatory Visit (INDEPENDENT_AMBULATORY_CARE_PROVIDER_SITE_OTHER): Payer: MEDICARE | Admitting: *Deleted

## 2017-06-01 DIAGNOSIS — I495 Sick sinus syndrome: Secondary | ICD-10-CM

## 2017-06-02 ENCOUNTER — Encounter: Payer: Self-pay | Admitting: Cardiology

## 2017-06-02 NOTE — Progress Notes (Signed)
Remote pacemaker transmission.   

## 2017-06-07 ENCOUNTER — Other Ambulatory Visit: Payer: Self-pay | Admitting: Pharmacist Clinician (PhC)/ Clinical Pharmacy Specialist

## 2017-06-08 LAB — CUP PACEART REMOTE DEVICE CHECK
Battery Voltage: 3.05 V
Brady Statistic AP VP Percent: 8.09 %
Brady Statistic RA Percent Paced: 31.95 %
Brady Statistic RV Percent Paced: 37.45 %
Date Time Interrogation Session: 20181205114226
Implantable Lead Implant Date: 20081126
Implantable Lead Model: 5076
Implantable Pulse Generator Implant Date: 20180228
Lead Channel Impedance Value: 380 Ohm
Lead Channel Impedance Value: 399 Ohm
Lead Channel Impedance Value: 399 Ohm
Lead Channel Impedance Value: 418 Ohm
Lead Channel Setting Pacing Amplitude: 2.5 V
Lead Channel Setting Pacing Amplitude: 2.75 V
MDC IDC LEAD IMPLANT DT: 20081126
MDC IDC LEAD LOCATION: 753859
MDC IDC LEAD LOCATION: 753860
MDC IDC MSMT BATTERY REMAINING LONGEVITY: 117 mo
MDC IDC MSMT LEADCHNL RA SENSING INTR AMPL: 0.5 mV
MDC IDC MSMT LEADCHNL RA SENSING INTR AMPL: 0.5 mV
MDC IDC MSMT LEADCHNL RV PACING THRESHOLD AMPLITUDE: 1 V
MDC IDC MSMT LEADCHNL RV PACING THRESHOLD PULSEWIDTH: 0.4 ms
MDC IDC MSMT LEADCHNL RV SENSING INTR AMPL: 10.5 mV
MDC IDC MSMT LEADCHNL RV SENSING INTR AMPL: 10.5 mV
MDC IDC SET LEADCHNL RV PACING PULSEWIDTH: 0.4 ms
MDC IDC SET LEADCHNL RV SENSING SENSITIVITY: 2 mV
MDC IDC STAT BRADY AP VS PERCENT: 45.9 %
MDC IDC STAT BRADY AS VP PERCENT: 4.72 %
MDC IDC STAT BRADY AS VS PERCENT: 41.17 %

## 2017-07-08 ENCOUNTER — Ambulatory Visit (INDEPENDENT_AMBULATORY_CARE_PROVIDER_SITE_OTHER): Payer: MEDICARE | Admitting: Pharmacist Clinician (PhC)/ Clinical Pharmacy Specialist

## 2017-07-08 DIAGNOSIS — I480 Paroxysmal atrial fibrillation: Secondary | ICD-10-CM

## 2017-07-08 DIAGNOSIS — I482 Chronic atrial fibrillation, unspecified: Secondary | ICD-10-CM

## 2017-07-08 LAB — COAGUCHEK XS/INR WAIVED
INR: 1.5 — AB (ref 0.9–1.1)
Prothrombin Time: 17.8 s

## 2017-07-08 NOTE — Patient Instructions (Signed)
Description   Change to taking 2 tablets on Mondays and Fridays and 1 tablet all other days of the week  INR today is 1.5 (goal: 2-3) little thick today

## 2017-08-19 ENCOUNTER — Ambulatory Visit (INDEPENDENT_AMBULATORY_CARE_PROVIDER_SITE_OTHER): Payer: MEDICARE | Admitting: Pharmacist Clinician (PhC)/ Clinical Pharmacy Specialist

## 2017-08-19 ENCOUNTER — Encounter: Payer: MEDICARE | Admitting: Pharmacist Clinician (PhC)/ Clinical Pharmacy Specialist

## 2017-08-19 DIAGNOSIS — I480 Paroxysmal atrial fibrillation: Secondary | ICD-10-CM

## 2017-08-19 DIAGNOSIS — I482 Chronic atrial fibrillation, unspecified: Secondary | ICD-10-CM

## 2017-08-19 LAB — COAGUCHEK XS/INR WAIVED
INR: 1.9 — ABNORMAL HIGH (ref 0.9–1.1)
PROTHROMBIN TIME: 22.9 s

## 2017-08-19 NOTE — Patient Instructions (Signed)
Description   Continue taking 2 tablets on Mondays and Fridays and 1 tablet all other days of the week  INR today is 1.9 ( goal is 2-3)

## 2017-08-31 ENCOUNTER — Telehealth: Payer: Self-pay | Admitting: Cardiology

## 2017-08-31 ENCOUNTER — Ambulatory Visit (INDEPENDENT_AMBULATORY_CARE_PROVIDER_SITE_OTHER): Payer: MEDICARE | Admitting: *Deleted

## 2017-08-31 DIAGNOSIS — I495 Sick sinus syndrome: Secondary | ICD-10-CM | POA: Diagnosis not present

## 2017-08-31 NOTE — Telephone Encounter (Signed)
Spoke with pt and reminded pt of remote transmission that is due today. Pt verbalized understanding.   

## 2017-09-09 ENCOUNTER — Encounter: Payer: Self-pay | Admitting: Cardiology

## 2017-09-09 LAB — CUP PACEART REMOTE DEVICE CHECK
Brady Statistic AP VP Percent: 7.86 %
Brady Statistic AS VP Percent: 4.77 %
Brady Statistic RA Percent Paced: 29.29 %
Date Time Interrogation Session: 20190309164709
Implantable Lead Location: 753859
Implantable Lead Location: 753860
Implantable Lead Model: 5076
Implantable Pulse Generator Implant Date: 20180228
Lead Channel Impedance Value: 342 Ohm
Lead Channel Impedance Value: 380 Ohm
Lead Channel Pacing Threshold Amplitude: 1 V
Lead Channel Pacing Threshold Pulse Width: 0.4 ms
Lead Channel Sensing Intrinsic Amplitude: 0.75 mV
Lead Channel Sensing Intrinsic Amplitude: 8.875 mV
Lead Channel Setting Pacing Amplitude: 2.5 V
Lead Channel Setting Pacing Pulse Width: 0.4 ms
Lead Channel Setting Sensing Sensitivity: 2 mV
MDC IDC LEAD IMPLANT DT: 20081126
MDC IDC LEAD IMPLANT DT: 20081126
MDC IDC MSMT BATTERY REMAINING LONGEVITY: 114 mo
MDC IDC MSMT BATTERY VOLTAGE: 3.02 V
MDC IDC MSMT LEADCHNL RA IMPEDANCE VALUE: 380 Ohm
MDC IDC MSMT LEADCHNL RA IMPEDANCE VALUE: 418 Ohm
MDC IDC MSMT LEADCHNL RA SENSING INTR AMPL: 0.75 mV
MDC IDC MSMT LEADCHNL RV SENSING INTR AMPL: 8.875 mV
MDC IDC SET LEADCHNL RA PACING AMPLITUDE: 2.75 V
MDC IDC STAT BRADY AP VS PERCENT: 45.38 %
MDC IDC STAT BRADY AS VS PERCENT: 41.83 %
MDC IDC STAT BRADY RV PERCENT PACED: 42.08 %

## 2017-09-09 NOTE — Progress Notes (Signed)
Remote pacemaker transmission.   

## 2017-09-20 ENCOUNTER — Other Ambulatory Visit: Payer: Self-pay | Admitting: Pharmacist Clinician (PhC)/ Clinical Pharmacy Specialist

## 2017-09-23 ENCOUNTER — Telehealth: Payer: Self-pay | Admitting: Cardiovascular Disease

## 2017-09-23 ENCOUNTER — Ambulatory Visit (INDEPENDENT_AMBULATORY_CARE_PROVIDER_SITE_OTHER): Payer: MEDICARE | Admitting: Pharmacist Clinician (PhC)/ Clinical Pharmacy Specialist

## 2017-09-23 DIAGNOSIS — I482 Chronic atrial fibrillation, unspecified: Secondary | ICD-10-CM

## 2017-09-23 DIAGNOSIS — I480 Paroxysmal atrial fibrillation: Secondary | ICD-10-CM

## 2017-09-23 LAB — COAGUCHEK XS/INR WAIVED
INR: 1.8 — ABNORMAL HIGH (ref 0.9–1.1)
Prothrombin Time: 21.7 s

## 2017-09-23 MED ORDER — METOPROLOL TARTRATE 50 MG PO TABS: 50.0000 mg | ORAL_TABLET | Freq: Two times a day (BID) | ORAL | 0 refills | Status: DC

## 2017-09-23 MED ORDER — WARFARIN SODIUM 2.5 MG PO TABS: ORAL_TABLET | ORAL | 0 refills | Status: DC

## 2017-09-23 MED ORDER — TRIAMTERENE-HCTZ 37.5-25 MG PO CAPS: 1.0000 | ORAL_CAPSULE | Freq: Every day | ORAL | 0 refills | Status: DC

## 2017-09-23 NOTE — Patient Instructions (Signed)
Description   Change to taking 2 tablets Mondays, Wednesdays, and Fridays, and 1 tablet all other days of the week.  INR today is 1.8 ( goal is 2-3)

## 2017-09-23 NOTE — Telephone Encounter (Signed)
New Message      *STAT* If patient is at the pharmacy, call can be transferred to refill team.   1. Which medications need to be refilled? (please list name of each medication and dose if known)   metoprolol tartrate (LOPRESSOR) 50 MG tablet Take 1 tablet (50 mg total) by mouth 2 (two) times daily.   triamterene-hydrochlorothiazide (DYAZIDE) 37.5-25 MG capsule Take 1 each (1 capsule total) by mouth daily.    2. Which pharmacy/location (including street and city if local pharmacy) is medication to be sent to? [facility]   3. Do they need a 30 day or 90 day supply?  [place]

## 2017-10-04 ENCOUNTER — Encounter: Payer: Self-pay | Admitting: Family Medicine

## 2017-10-04 ENCOUNTER — Ambulatory Visit (INDEPENDENT_AMBULATORY_CARE_PROVIDER_SITE_OTHER): Payer: MEDICARE | Admitting: Family Medicine

## 2017-10-04 VITALS — BP 129/69 | HR 75 | Temp 97.6°F | Ht 68.0 in | Wt 168.0 lb

## 2017-10-04 DIAGNOSIS — I480 Paroxysmal atrial fibrillation: Secondary | ICD-10-CM

## 2017-10-04 DIAGNOSIS — Z7689 Persons encountering health services in other specified circumstances: Secondary | ICD-10-CM

## 2017-10-04 DIAGNOSIS — Z9889 Other specified postprocedural states: Secondary | ICD-10-CM

## 2017-10-04 DIAGNOSIS — Z0000 Encounter for general adult medical examination without abnormal findings: Secondary | ICD-10-CM

## 2017-10-04 DIAGNOSIS — Z7901 Long term (current) use of anticoagulants: Secondary | ICD-10-CM | POA: Diagnosis not present

## 2017-10-04 LAB — COAGUCHEK XS/INR WAIVED
INR: 2.7 — ABNORMAL HIGH (ref 0.9–1.1)
Prothrombin Time: 31.9 s

## 2017-10-04 NOTE — Patient Instructions (Addendum)
Follow up in 2 weeks for repeat PT/INR (Coumadin Check).   What You Need to Know About Warfarin Warfarin is a blood thinner (anticoagulant). Anticoagulants help to prevent the formation of blood clots. They also help to stop the growth of blood clots. Who should use warfarin? Warfarin is prescribed for people who are at risk for developing harmful blood clots, such as people who have:  Surgically implanted mechanical heart valves.  Irregular heart rhythms (atrial fibrillation).  Certain clotting disorders.  A history of harmful blood clotting in the past. This includes people who have had: ? A stroke. ? Blood clot in the lungs (pulmonary embolism, or PE). ? Blood clot in the legs (deep vein thrombosis, or DVT).  An existing blood clot.  How is warfarin taken?  Warfarin is a medicine that you take by mouth (orally). Warfarin tablets come in different strengths. Each tablet strength is a different color, with the amount of warfarin printed on the tablet. If you get a new prescription filled and the color of your tablet is different than usual, tell your pharmacist or health care provider immediately. What blood tests do I need while taking warfarin? The goal of warfarin therapy is to lessen the clotting tendency of blood, but not to prevent clotting completely. Your health care provider will monitor the anticoagulation effect of warfarin closely and will adjust your dose as needed. Warfarin is a medicine that needs to be closely monitored, so it is very important to keep all lab visits and follow-up visits with your health care provider. While taking warfarin, you will need to have blood tests (prothrombin tests, or PT tests) regularly to measure your blood clotting time. This type of test can be done with a finger stick or a blood draw. What does the INR test result mean? The PT test results will be reported as the International Normalized Ratio (INR). The INR tells your health care  provider whether your dosage of warfarin needs to be changed. The longer it takes your blood to clot, the higher the INR. Your health care provider will tell you your target INR range. If your INR is not in your target range, your health care provider may adjust your dosage.  If your INR is above your target range, there is a risk of bleeding. Your dosage of warfarin may need to be decreased.  If your INR is below your target range, there is a risk of clotting. Your dosage of warfarin may need to be increased.  How often is the INR test needed?  When you first start warfarin, you will usually have your INR checked every few days.  You may need to have INR tests done more than once a week until you are taking the correct dosage of warfarin.  After you have reached your target INR, your INR will be tested less often. However, you will need to have your INR checked at least once every 4-6 weeks for the entire time you are taking warfarin. What are the side effects of warfarin? Too much warfarin can cause bleeding (hemorrhage) in any part of the body, such as:  Bleeding from the gums.  Unexplained bruises.  Bruises that get larger.  Blood in the urine.  Bloody or dark stools.  Bleeding in the brain (hemorrhagic stroke).  A nosebleed that is not easily stopped.  Coughing up blood.  Vomiting blood.  Warfarin use may also cause:  Skin rash or irritations  Nausea that does not go away.  Severe  pain in the back or joints.  Painful toes that turn blue or purple (purple toe syndrome).  Painful ulcers that do not go away (skin necrosis).  What are the signs and symptoms of a blood clot? Too little warfarin can increase the risk of blood clots in your legs, lungs, or arms. Signs and symptoms of a DVT in your leg or arm may include:  Pain or swelling in your leg or arm.  Skin that is red or warm to the touch on your arm or leg.  Signs and symptoms of a pulmonary embolism may  include:  Shortness of breath or difficulty breathing.  Chest pain.  Unexplained fever.  What are the signs and symptoms of a stroke? If you are taking too much or too little warfarin, you can have a stroke. Signs and symptoms of a stroke may include:  Weakness or numbness of your face, arm, or leg, especially on one side of your body.  Confusion or trouble thinking clearly.  Difficulty seeing with one or both eyes.  Difficulty walking or moving your arms or legs.  Dizziness.  Loss of balance or coordination.  Trouble speaking, trouble understanding speech, or both (aphasia).  Sudden, severe headache with no known cause.  Partial or total loss of consciousness.  What precautions do I need to take while using warfarin?   Take warfarin exactly as told by your health care provider. Doing this helps you avoid bleeding or blood clots that could result in serious injury, pain, or disability.  Take your medicine at the same time every day. If you forget to take your dose of warfarin, take it as soon as you remember that day. If you do not remember on that day, do not take an extra dose the next day.  Contact your health care provider if you miss or take an extra dose. Do not change your dosage on your own to make up for missed or extra doses.  Wear or carry identification that says that you are taking warfarin.  Make sure that all health care providers, including your dentist, know you are taking warfarin.  If you need surgery, talk with your health care provider about whether you should stop taking warfarin before your surgery.  Avoid situations that cause bleeding. You may bleed more easily while taking warfarin. To limit bleeding, take the following actions: ? Use a softer toothbrush. ? Floss with waxed floss, not unwaxed floss. ? Shave with an electric razor, not with a blade. ? Limit your use of sharp objects. ? Avoid potentially harmful activities, such as contact  sports. What do I need to know about warfarin and pregnancy or breastfeeding?  Warfarin is not recommended during the first trimester of pregnancy due to an increased risk of birth defects. In certain situations, a woman may take warfarin after her first trimester of pregnancy.  If you are taking warfarin and you become pregnant or plan to become pregnant, contact your health care provider right away.  If you plan to breastfeed while taking warfarin, talk with your health care provider first. What do I need to know about warfarin and alcohol or drug use?  Avoid drinking alcohol, or limit alcohol intake to no more than 1 drink a day for nonpregnant women and 2 drinks a day for men. One drink equals 12 oz of beer, 5 oz of wine, or 1 oz of hard liquor. ? If you change the amount of alcohol that you drink, tell your health care  provider. Your warfarin dosage may need to be changed.  Avoid tobacco products, such as cigarettes, chewing tobacco, and e-cigarettes. If you need help quitting, ask your health care provider. ? If you change the amount of nicotine or tobacco that you use, tell your health care provider. Your warfarin dosage may need to be changed.  Avoid street drugs while taking warfarin. The effects of street drugs on warfarin are not known. What do I need to know about warfarin and other medicines or supplements?  Many prescription and over-the-counter medicines can interfere with warfarin. Talk with your health care provider or your pharmacist before starting or stopping any new medicines. This includes over-the-counter vitamins, dietary supplements, herbal medicines, and pain medicines. Your warfarin dosage may need to be adjusted.  Some common over-the-counter medicines that may increase the risk of bleeding while taking warfarin include: ? Acetaminophen. ? Aspirin ? NSAIDs, such as ibuprofen or naproxen. ? Vitamin E. What do I need to know about warfarin and my diet?  It is  important to maintain a normal, balanced diet while taking warfarin. Avoid major changes in your diet. If you are going to change your diet, talk with your health care provider before making changes.  Your health care provider may recommend that you work with a diet and nutrition specialist (dietitian).  Vitamin K decreases the effect of warfarin, and it is found in many foods. Eat a consistent amount of foods that contain vitamin K. For example, you may decide to eat 2 vitamin K-containing foods each day. Most foods that are high in vitamin K are green and leafy. Common foods that contain high amounts of vitamin K include:  Kale, raw or cooked.  Spinach, raw or cooked.  Collards, raw or cooked.  Swiss chard, raw or cooked.  Mustard greens, raw or cooked.  Turnip greens, raw or cooked.  Parsley, raw.  Broccoli, cooked.  Noodles, eggs, and spinach, enriched.  Brussels sprouts, raw or cooked.  Beet greens, raw or cooked.  Endive, raw.  Cabbage, cooked.  Asparagus, cooked.  Foods that contain moderate amounts of vitamin K include:  Broccoli, raw.  Cabbage, raw.  Bok choy, cooked.  Green leaf lettuce, raw  Prunes, stewed.  Angie Fava.  Kiwi.  Edamame, cooked.  Romaine lettuce, raw.  Avocado.  Tuna, canned in oil.  Okra, cooked.  Black-eyed peas, cooked.  Green beans, cooked or raw.  Blueberries, raw.  Blackberries, raw.  Peas, cooked or raw.  Contact a health care provider if:  You miss a dose.  You take an extra dose.  You plan to have any kind of surgery or procedure.  You are unable to take your medicine due to nausea, vomiting, or diarrhea.  You have any major changes in your diet or you plan to make any major changes in your diet.  You start or stop any over-the-counter medicine, prescription medicine, or dietary supplement.  You become pregnant, plan to become pregnant, or think you may be pregnant.  You have menstrual periods that  are heavier than usual.  You have unusual bruising. Get help right away if:  You develop symptoms of an allergic reaction, such as: ? Swelling of the lips, face, tongue, mouth, or throat. ? Rash. ? Itching. ? Itchy, red, swollen areas of skin (hives). ? Trouble breathing. ? Chest tightness.  You have: ? Signs or symptoms of a stroke. ? Signs or symptoms of a blood clot. ? A fall or have an accident, especially if you hit  your head. ? Blood in your urine. Your urine may look reddish, pinkish, or tea-colored. ? Blood in your stool. Your stool may be black or bright red. ? Bleeding that does not stop after applying pressure to the area for 30 minutes. ? Severe pain in your joints or back. ? Purple or blue toes. ? Skin ulcers that do not go away.  You vomit blood or cough up blood. The blood may be bright red, or it may look like coffee grounds. These symptoms may represent a serious problem that is an emergency. Do not wait to see if the symptoms will go away. Get medical help right away. Call your local emergency services (911 in the [place] Do not drive yourself to the hospital. Summary  Warfarin needs to be closely monitored with blood tests. It is very important to keep all lab visits and follow-up visits with your health care provider.  Make sure that you know your target INR range and your warfarin dosage.  Wear or carry identification that says that you are taking warfarin.  Take warfarin at the same time every day. Call your health care provider if you miss a dose or if you take an extra dose. Do not change the dosage of warfarin on your own.  Know the signs and symptoms of blood clots, bleeding, and a stroke. Know when to get emergency medical help.  Tell all health care providers who care for you that you are taking warfarin.  Talk with your health care provider or your pharmacist before starting or stopping any new medicines.  Monitor how much vitamin K you eat every  day. Try to eat the same amount every day. This information is not intended to replace advice given to you by your health care provider. Make sure you discuss any questions you have with your health care provider. Document Released: 06/14/2005 Document Revised: 02/24/2016 Document Reviewed: 09/10/2015 Elsevier Interactive Patient Education  2017 [employer].

## 2017-10-04 NOTE — Progress Notes (Signed)
[name] is a 84 y.o. female presents to office today to establish care and for coumadin check  Concerns today include: 1. Chronic anticoagulation Patient is chronically anticoagulated with warfarin for a mitral valve repair and paroxysmal atrial fibrillation.  She sees Dr. Sallyanne Kuster for cardiology.  Her last PT/INR check was September 23, 2017.  She was subtherapeutic at that time with an INR of 1.8.  Her goal is 2-3 given mitral valve repair and A. Fib.  Her dosing since that visit was 2 tablets on Friday, 1 tablet on Saturday, 1 tablet on Sunday, 2 tablets on Monday, 1 tablet on Tuesday, 2 tablets on Wednesday, 1 tablet on Thursday.  She notes that she actually has a primary care doctor in [place] but was told that she had established with this clinic in order to continue having Coumadin checks.  She reports being satisfied with her primary care doctor in [place] but does not wish to continue traveling to [place] multiple times per year for Coumadin checks.  She has a pacemaker in place.  Immunizations needed: Flu Vaccine: yes ; Tdap Vaccine: yes    Past Medical History:  Diagnosis Date  . A-fib ([place])   . Dyslipidemia   . Hypothyroidism    2 degree amidarone   . Pacemaker   . PAF (paroxysmal atrial fibrillation) ([place])   . S/P mitral valve repair   . SSS (sick sinus syndrome) ([place])   . Systemic hypertension    Social History   Socioeconomic History  . Marital status: Married    Spouse name: Not on file  . Number of children: Not on file  . Years of education: Not on file  . Highest education level: Not on file  Occupational History  . Not on file  Social Needs  . Financial resource strain: Not on file  . Food insecurity:    Worry: Not on file    Inability: Not on file  . Transportation needs:    Medical: Not on file    Non-medical: Not on file  Tobacco Use  . Smoking status: Never Smoker  . Smokeless tobacco: Never Used  Substance and Sexual Activity  .  Alcohol use: No  . Drug use: Not on file  . Sexual activity: Not on file  Lifestyle  . Physical activity:    Days per week: Not on file    Minutes per session: Not on file  . Stress: Not on file  Relationships  . Social connections:    Talks on phone: Not on file    Gets together: Not on file    Attends religious service: Not on file    Active member of club or organization: Not on file    Attends meetings of clubs or organizations: Not on file    Relationship status: Not on file  . Intimate partner violence:    Fear of current or ex partner: Not on file    Emotionally abused: Not on file    Physically abused: Not on file    Forced sexual activity: Not on file  Other Topics Concern  . Not on file  Social History Narrative  . Not on file   Past Surgical History:  Procedure Laterality Date  . ABDOMINAL HYSTERECTOMY  1989  . CARDIAC CATHETERIZATION  01/11/2008   mild-mod pulm. hypertension,normal coronaries  . MINIMALLY INVASIVE MAZE PROCEDURE  02/2008  . MITRAL VALVE REPAIR  September 2009   [facility] ring annuloplasty  . NM MYOVIEW LTD  03/28/2007   no ischemia  . PACEMAKER PLACEMENT  November 2008  . PPM GENERATOR CHANGEOUT N/A 08/25/2016   Procedure: PPM Generator Changeout - Battery Only;  Surgeon: Sanda Klein, MD;  Location: [facility];  Service: Cardiovascular;  Laterality: N/A;  . US ECHOCARDIOGRAPHY  03/07/2012   mild LVH,EF =>55%,mild MR,mod TR,trace AI,right atrium mod. dilated   No family history on file.  Current Outpatient Medications:  .  acetaminophen (TYLENOL) 500 MG tablet, Take 500 mg by mouth daily as needed for headache. , Disp: , Rfl:  .  levothyroxine (SYNTHROID, LEVOTHROID) 25 MCG tablet, Take 1 tablet (25 mcg total) by mouth daily., Disp: 30 tablet, Rfl: 2 .  levothyroxine (SYNTHROID, LEVOTHROID) 25 MCG tablet, TAKE 1 TABLET DAILY, Disp: 30 tablet, Rfl: 9 .  metoprolol tartrate (LOPRESSOR) 50 MG tablet, Take 1 tablet (50 mg total) by  mouth 2 (two) times daily., Disp: 180 tablet, Rfl: 0 .  Polyethyl Glycol-Propyl Glycol (SYSTANE OP), Apply 1 drop to eye daily as needed (dry eyes)., Disp: , Rfl:  .  Probiotic Product (PROBIOTIC PO), Take 2 tablets by mouth daily., Disp: , Rfl:  .  risedronate (ACTONEL) 150 MG tablet, Take 1 tablet (150 mg total) by mouth every 30 (thirty) days. with water on empty stomach, nothing by mouth or lie down for next 30 minutes., Disp: 3 tablet, Rfl: 4 .  triamterene-hydrochlorothiazide (DYAZIDE) 37.5-25 MG capsule, Take 1 each (1 capsule total) by mouth daily., Disp: 90 capsule, Rfl: 0 .  warfarin (COUMADIN) 2.5 MG tablet, Take 2 tablets on Mondays, Wednesdays, and Fridays and 1 tablet all other days of the week., Disp: 150 tablet, Rfl: 0  Allergies  Allergen Reactions  . Statins Other (See Comments)    Myalgia     ROS: Review of Systems A comprehensive review of systems was negative except for: Cardiovascular: positive for occ palpitations.  has a-fib, on coumadin.  Integument/breast: positive for easy bleeding/ bruising    Physical exam BP 129/69   Pulse 75   Temp 97.6 F (36.4 C) (Oral)   Ht 5\' 8"  (1.727 m)   Wt 168 lb (76.2 kg)   BMI 25.54 kg/m  General appearance: alert, cooperative and appears stated age Head: Normocephalic, without obvious abnormality, atraumatic Eyes: negative findings: lids and lashes normal, conjunctivae and sclerae normal and corneas clear Back: mild lumbar scoliotic curve noted to the left  Lungs: clear to auscultation bilaterally Heart: regular rate and rhythm, S1, S2 normal, no murmur, click, rub or gallop Extremities: extremities normal, atraumatic, no cyanosis or edema Pulses: 2+ and symmetric Skin: senile purpura noted.  there is a healing laceration on the left thumb. no evidence of infeciton   No results found for this or any previous visit (from the past 24 hour(s)).  Assessment/ Plan: [name] here for annual physical exam.   1.  Paroxysmal atrial fibrillation ([place]) Rate controlled.  Rhythm seemed to be regular as well today.  Continue current regimen.  PT/INR within normal limits, with goal of 2-3.  Follow-up with pharmacist in 6 weeks for recheck. - CoaguChek XS/INR Waived  2. Physical exam Interval exam as above.  Patient has aged out of many of the screening exams.  She had no concerns today.  3. S/P mitral valve repair Sees Dr. Sallyanne Kuster.  Next appointment planned for June. - CoaguChek XS/INR Waived  4. Long term current use of anticoagulant Continue current warfarin schedule.  Follow-up as above. - CoaguChek XS/INR Waived  5.  Establishing care with new doctor, encounter for Unsure the patient will continue to follow-up with me.  It seems that she was simply completing a formality so that she could continue anticoagulation monitoring here.  I encouraged her to follow-up as needed.   Counseled on healthy lifestyle choices, including diet (rich in fruits, vegetables and lean meats and low in salt and simple carbohydrates) and exercise (at least 30 minutes of moderate physical activity daily).  Patient to follow up in 1 year for annual exam or sooner if needed.  [name] M. Lajuana Ripple, DO

## 2017-12-01 ENCOUNTER — Other Ambulatory Visit: Payer: Self-pay | Admitting: Pharmacist Clinician (PhC)/ Clinical Pharmacy Specialist

## 2017-12-05 ENCOUNTER — Telehealth: Payer: Self-pay | Admitting: Family Medicine

## 2017-12-05 ENCOUNTER — Ambulatory Visit (INDEPENDENT_AMBULATORY_CARE_PROVIDER_SITE_OTHER): Payer: MEDICARE | Admitting: *Deleted

## 2017-12-05 ENCOUNTER — Telehealth: Payer: Self-pay

## 2017-12-05 DIAGNOSIS — I495 Sick sinus syndrome: Secondary | ICD-10-CM

## 2017-12-05 NOTE — Telephone Encounter (Signed)
Apt made with Selinda Eon 6/14. Was supposed to follow up 6 weeks and patient aware.

## 2017-12-05 NOTE — Telephone Encounter (Signed)
Spoke with pt and reminded pt of remote transmission that is due today. Pt verbalized understanding.   

## 2017-12-06 ENCOUNTER — Encounter: Payer: Self-pay | Admitting: Cardiology

## 2017-12-06 LAB — CUP PACEART REMOTE DEVICE CHECK
Battery Remaining Longevity: 106 mo
Battery Voltage: 3.01 V
Brady Statistic AP VS Percent: 51.17 %
Brady Statistic AS VP Percent: 4.55 %
Brady Statistic AS VS Percent: 35.54 %
Brady Statistic RV Percent Paced: 35.04 %
Implantable Lead Implant Date: 20081126
Implantable Lead Location: 753859
Implantable Lead Model: 5076
Lead Channel Impedance Value: 361 Ohm
Lead Channel Impedance Value: 380 Ohm
Lead Channel Impedance Value: 418 Ohm
Lead Channel Pacing Threshold Pulse Width: 0.4 ms
Lead Channel Sensing Intrinsic Amplitude: 0.375 mV
Lead Channel Sensing Intrinsic Amplitude: 9.375 mV
Lead Channel Sensing Intrinsic Amplitude: 9.375 mV
Lead Channel Setting Pacing Amplitude: 2.75 V
Lead Channel Setting Pacing Pulse Width: 0.4 ms
Lead Channel Setting Sensing Sensitivity: 2 mV
MDC IDC LEAD IMPLANT DT: 20081126
MDC IDC LEAD LOCATION: 753860
MDC IDC MSMT LEADCHNL RA SENSING INTR AMPL: 0.375 mV
MDC IDC MSMT LEADCHNL RV IMPEDANCE VALUE: 342 Ohm
MDC IDC MSMT LEADCHNL RV PACING THRESHOLD AMPLITUDE: 0.75 V
MDC IDC PG IMPLANT DT: 20180228
MDC IDC SESS DTM: 20190610205423
MDC IDC SET LEADCHNL RA PACING AMPLITUDE: 2.75 V
MDC IDC STAT BRADY AP VP PERCENT: 8.62 %
MDC IDC STAT BRADY RA PERCENT PACED: 40.3 %

## 2017-12-06 NOTE — Progress Notes (Signed)
Remote pacemaker transmission.   

## 2017-12-07 ENCOUNTER — Other Ambulatory Visit: Payer: Self-pay | Admitting: Cardiovascular Disease

## 2017-12-09 ENCOUNTER — Other Ambulatory Visit: Payer: Self-pay | Admitting: Cardiovascular Disease

## 2017-12-09 ENCOUNTER — Ambulatory Visit (INDEPENDENT_AMBULATORY_CARE_PROVIDER_SITE_OTHER): Payer: MEDICARE | Admitting: Pharmacist Clinician (PhC)/ Clinical Pharmacy Specialist

## 2017-12-09 DIAGNOSIS — I480 Paroxysmal atrial fibrillation: Secondary | ICD-10-CM

## 2017-12-09 DIAGNOSIS — I482 Chronic atrial fibrillation, unspecified: Secondary | ICD-10-CM

## 2017-12-09 LAB — COAGUCHEK XS/INR WAIVED
INR: 3.8 — ABNORMAL HIGH (ref 0.9–1.1)
Prothrombin Time: 45.5 s

## 2017-12-09 NOTE — Patient Instructions (Signed)
Description   No warfarin today then go back to taking 5mg  on Mondays and Fridays and 2.5mg  all other days of the week.  INR today is 3.8 (goal 2-3) level too thin today

## 2017-12-12 NOTE — Telephone Encounter (Signed)
°*  STAT* If patient is at the pharmacy, call can be transferred to refill team.   1. Which medications need to be refilled? (please list name of each medication and dose if known) Levothyroxinesw 2. Which pharmacy/location (including street and city if local pharmacy) is medication to be sent to? [facility]  3. Do they need a 30 day or 90 day supply? 90 and refills

## 2017-12-12 NOTE — Telephone Encounter (Signed)
Dr Croitoru's pt [name]

## 2017-12-12 NOTE — Telephone Encounter (Signed)
Defer to PCP, please MCr

## 2018-01-04 ENCOUNTER — Encounter: Payer: MEDICARE | Admitting: Cardiovascular Disease

## 2018-01-05 ENCOUNTER — Ambulatory Visit (INDEPENDENT_AMBULATORY_CARE_PROVIDER_SITE_OTHER): Payer: MEDICARE | Admitting: Cardiovascular Disease

## 2018-01-05 ENCOUNTER — Encounter: Payer: Self-pay | Admitting: Cardiovascular Disease

## 2018-01-05 VITALS — BP 128/70 | HR 92 | Ht 69.0 in | Wt 167.6 lb

## 2018-01-05 DIAGNOSIS — I495 Sick sinus syndrome: Secondary | ICD-10-CM

## 2018-01-05 DIAGNOSIS — I10 Essential (primary) hypertension: Secondary | ICD-10-CM | POA: Diagnosis not present

## 2018-01-05 DIAGNOSIS — I480 Paroxysmal atrial fibrillation: Secondary | ICD-10-CM

## 2018-01-05 DIAGNOSIS — Z9889 Other specified postprocedural states: Secondary | ICD-10-CM

## 2018-01-05 DIAGNOSIS — Z950 Presence of cardiac pacemaker: Secondary | ICD-10-CM

## 2018-01-05 DIAGNOSIS — Z7901 Long term (current) use of anticoagulants: Secondary | ICD-10-CM

## 2018-01-05 NOTE — Progress Notes (Signed)
Cardiology Office Note    Date:  01/05/2018   ID:  [name], DOB [birth year], MRN [id number]  PCP:  [name], DO  Cardiologist:   Sanda Klein, MD   Chief Complaint  Patient presents with  . Follow-up    Pacemaker all   . Atrial Fibrillation    History of Present Illness:  [name] is a 84 y.o. female with tachycardia-bradycardia syndrome (sinus node dysfunction and paroxysmal atrial fibrillation) here for follow-up on her dual-chamber permanent pacemaker (Initial device 2008, Medtronic Azure XT generator changeout February 2018). She is not pacemaker dependent.  She has truly been feeling well.  She denies problems with shortness of breath or chest pain with activity.  She is tired since she has been caring for both her elderly husband and for her son recently had a knee replacement.  She has not been aware of any palpitations, syncope, falls or bleeding.  She is compliant with warfarin anticoagulation and monitored.  Her typical blood pressure at home is in the 110/70-120/80 range.   She has roughly 60% atrial pacing and 13% ventricular pacing.  The burden of atrial fibrillation is apparently increased to about 35%, but accurate estimation is difficult due to some degree of undersensing.  As before her episodes last for a few hours or a few days and do not appear to be symptomatic. At most she feels occasional palpitations. Rate control remains excellent.   The patient specifically denies any chest pain at rest exertion, dyspnea at rest or with exertion, orthopnea, paroxysmal nocturnal dyspnea, syncope, palpitations, focal neurological deficits, intermittent claudication, lower extremity edema, unexplained weight gain, cough, hemoptysis or wheezing.  Anticoagulation is managed by her primary care provider in [place].  She remains frustrated by the volatility of the INR.  We again went over the relative benefit of direct oral anticoagulants, balanced by increased  cost.  She is still reluctant to make this switch.  She has hypertension. She has a history of mitral valve annuloplasty and Cox Maze cryoablation procedure via minithoracotomy in 2009, has preserved left ventricular systolic function, no residual mitral insufficiency, normal coronary arteries at preoperative angiogram.   Past Medical History:  Diagnosis Date  . A-fib ([place])   . Dyslipidemia   . Hypothyroidism    2 degree amidarone   . Pacemaker   . PAF (paroxysmal atrial fibrillation) ([place])   . S/P mitral valve repair   . SSS (sick sinus syndrome) ([place])   . Systemic hypertension     Past Surgical History:  Procedure Laterality Date  . ABDOMINAL HYSTERECTOMY  1989  . CARDIAC CATHETERIZATION  01/11/2008   mild-mod pulm. hypertension,normal coronaries  . MINIMALLY INVASIVE MAZE PROCEDURE  02/2008  . MITRAL VALVE REPAIR  September 2009   [facility] ring annuloplasty  . NM MYOVIEW LTD  03/28/2007   no ischemia  . PACEMAKER PLACEMENT  November 2008  . PPM GENERATOR CHANGEOUT N/A 08/25/2016   Procedure: PPM Generator Changeout - Battery Only;  Surgeon: Sanda Klein, MD;  Location: [facility];  Service: Cardiovascular;  Laterality: N/A;  . US ECHOCARDIOGRAPHY  03/07/2012   mild LVH,EF =>55%,mild MR,mod TR,trace AI,right atrium mod. dilated    Current Medications: Outpatient Medications Prior to Visit  Medication Sig Dispense Refill  . acetaminophen (TYLENOL) 500 MG tablet Take 500 mg by mouth daily as needed for headache.     . levothyroxine (SYNTHROID, LEVOTHROID) 25 MCG tablet Take 1 tablet (25 mcg total) by mouth daily.  30 tablet 2  . metoprolol tartrate (LOPRESSOR) 50 MG tablet TAKE 1 TABLET TWICE A DAY 180 tablet 0  . Polyethyl Glycol-Propyl Glycol (SYSTANE OP) Apply 1 drop to eye daily as needed (dry eyes).    . Probiotic Product (PROBIOTIC PO) Take 2 tablets by mouth daily.    Marland Kitchen triamterene-hydrochlorothiazide (DYAZIDE) 37.5-25 MG capsule TAKE 1 CAPSULE DAILY 90  capsule 0  . warfarin (COUMADIN) 2.5 MG tablet TAKE 2 TABLETS ON MONDAY, WEDNESDAY, AND FRIDAY AND 1 TABLET ALL OTHER DAYS OF THE WEEK. 150 tablet 0  . levothyroxine (SYNTHROID, LEVOTHROID) 25 MCG tablet TAKE 1 TABLET DAILY 30 tablet 9  . levothyroxine (SYNTHROID, LEVOTHROID) 25 MCG tablet TAKE 1 TABLET DAILY 30 tablet 0   No facility-administered medications prior to visit.      Allergies:   Statins   Social History   Socioeconomic History  . Marital status: Married    Spouse name: Not on file  . Number of children: Not on file  . Years of education: Not on file  . Highest education level: Not on file  Occupational History  . Not on file  Social Needs  . Financial resource strain: Not on file  . Food insecurity:    Worry: Not on file    Inability: Not on file  . Transportation needs:    Medical: Not on file    Non-medical: Not on file  Tobacco Use  . Smoking status: Never Smoker  . Smokeless tobacco: Never Used  Substance and Sexual Activity  . Alcohol use: No  . Drug use: Not on file  . Sexual activity: Not on file  Lifestyle  . Physical activity:    Days per week: Not on file    Minutes per session: Not on file  . Stress: Not on file  Relationships  . Social connections:    Talks on phone: Not on file    Gets together: Not on file    Attends religious service: Not on file    Active member of club or organization: Not on file    Attends meetings of clubs or organizations: Not on file    Relationship status: Not on file  Other Topics Concern  . Not on file  Social History Narrative  . Not on file     ROS:   Please see the history of present illness.    ROS All other systems reviewed and are negative.   PHYSICAL EXAM:   VS:  BP 128/70   Pulse 92   Ht 5\' 9"  (1.753 m)   Wt 167 lb 9.6 oz (76 kg)   BMI 24.75 kg/m       General: Alert, oriented x3, no distress, appears comfortable; pacemaker and sternotomy scars are well-healed Head: no evidence of  trauma, PERRL, EOMI, no exophtalmos or lid lag, no myxedema, no xanthelasma; normal ears, nose and oropharynx Neck: normal jugular venous pulsations and no hepatojugular reflux; brisk carotid pulses without delay and no carotid bruits Chest: clear to auscultation, no signs of consolidation by percussion or palpation, normal fremitus, symmetrical and full respiratory excursions Cardiovascular: normal position and quality of the apical impulse, regular rhythm, normal first and second heart sounds, no murmurs, rubs or gallops Abdomen: no tenderness or distention, no masses by palpation, no abnormal pulsatility or arterial bruits, normal bowel sounds, no hepatosplenomegaly Extremities: no clubbing, cyanosis or edema; 2+ radial, ulnar and brachial pulses bilaterally; 2+ right femoral, posterior tibial and dorsalis pedis pulses; 2+ left femoral, posterior  tibial and dorsalis pedis pulses; no subclavian or femoral bruits Neurological: grossly nonfocal Psych: Normal mood and affect   Wt Readings from Last 3 Encounters:  01/05/18 167 lb 9.6 oz (76 kg)  10/04/17 168 lb (76.2 kg)  12/01/16 167 lb 12.8 oz (76.1 kg)      Studies/Labs Reviewed:   EKG:  EKG is ordered today.  Shows atrial paced, ventricular sensed rhythm with long AV delay (356 ms) ST segment depression and T wave inversion is seen in multiple leads particular prominent in the inferior leads and V3-V6. Last ECG available for comparison (that was not ventricularly paced) is from 2017 and is similar. Recent Labs: Labs from December 09, 2017 Normal LFTs, creatinine 1.46, hemoglobin 11.9 Total cholesterol 152, triglycerides 54, LDL 82, HDL 59 Lipid Panel    Component Value Date/Time   CHOL 223 (H) 05/21/2016 0954   TRIG 120 05/21/2016 0954   HDL 52 05/21/2016 0954   CHOLHDL 4.3 05/21/2016 0954   CHOLHDL 4.8 12/26/2012 1057   VLDL 30 12/26/2012 1057   LDLCALC 147 (H) 05/21/2016 0954   ASSESSMENT:    1. Paroxysmal atrial fibrillation  (HCC)   2. Tachy-brady syndrome ([place])   3. Pacemaker   4. Long term current use of anticoagulant   5. S/P mitral valve repair   6. Essential hypertension      PLAN:  In order of problems listed above:  1. AFib: The burden of atrial fibrillation continues to increase, but rate control remains good and she is not symptomatic. Continue anticoagulation.,  She will call if she decides she would like to switch from warfarin to a direct oral agent. CHADSVasc 4 (age 16, HTN, gender). She does not have atrial fibrillation related to rheumatic valvular heart disease. 2. SSS with tachy-bradycardia syndrome: The heart rate histogram distribution suggest appropriate signs or settings 3. PPM: Normal device function.  Remote downloads every 3 months and yearly office visits 4. Warfarin: As above, we have repeatedly discussed the pros and cons of switching to a direct oral agent such as Eliquis.  She is still uncertain. 5. S/P MV repair: Last echocardiogram performed in 2013. She does not have an audible murmur of mitral insufficiency. No signs of heart failure. 6. HTN: Well controlled.    Medication Adjustments/Labs and Tests Ordered: Current medicines are reviewed at length with the patient today.  Concerns regarding medicines are outlined above.  Medication changes, Labs and Tests ordered today are listed in the Patient Instructions below. Patient Instructions  Dr Sallyanne Kuster recommends that you continue on your current medications as directed. Please refer to the Current Medication list given to you today.  Remote monitoring is used to monitor your Pacemaker or ICD from home. This monitoring reduces the number of office visits required to check your device to one time per year. It allows [place] to keep an eye on the functioning of your device to ensure it is working properly. You are scheduled for a device check from home on Tuesday, September 9th, 2019. You may send your transmission at any time that day. If  you have a wireless device, the transmission will be sent automatically. After your physician reviews your transmission, you will receive a notification with your next transmission date.  To improve our patient care and to more adequately follow your device, [facility] has decided, as a practice, to start following each patient four times a year with your home monitor. This means that you may experience a remote appointment that is  close to an in-office appointment with your physician. Your insurance will apply at the same rate as other remote monitoring transmissions.  Dr Sallyanne Kuster recommends that you schedule a follow-up appointment in 12 months with a pacemaker check. You will receive a reminder letter in the mail two months in advance. If you don't receive a letter, please call our office to schedule the follow-up appointment.  If you need a refill on your cardiac medications before your next appointment, please call your pharmacy.    Signed, Sanda Klein, MD  01/05/2018 3:20 PM    [facility] [address], [place], [place]  [address] Phone: [phone]; Fax: [phone]

## 2018-01-05 NOTE — Patient Instructions (Signed)
Dr Sallyanne Kuster recommends that you continue on your current medications as directed. Please refer to the Current Medication list given to you today.  Remote monitoring is used to monitor your Pacemaker or ICD from home. This monitoring reduces the number of office visits required to check your device to one time per year. It allows [place] to keep an eye on the functioning of your device to ensure it is working properly. You are scheduled for a device check from home on Tuesday, September 9th, 2019. You may send your transmission at any time that day. If you have a wireless device, the transmission will be sent automatically. After your physician reviews your transmission, you will receive a notification with your next transmission date.  To improve our patient care and to more adequately follow your device, [facility] has decided, as a practice, to start following each patient four times a year with your home monitor. This means that you may experience a remote appointment that is close to an in-office appointment with your physician. Your insurance will apply at the same rate as other remote monitoring transmissions.  Dr Sallyanne Kuster recommends that you schedule a follow-up appointment in 12 months with a pacemaker check. You will receive a reminder letter in the mail two months in advance. If you don't receive a letter, please call our office to schedule the follow-up appointment.  If you need a refill on your cardiac medications before your next appointment, please call your pharmacy.

## 2018-01-06 ENCOUNTER — Ambulatory Visit (INDEPENDENT_AMBULATORY_CARE_PROVIDER_SITE_OTHER): Payer: MEDICARE | Admitting: Pharmacist Clinician (PhC)/ Clinical Pharmacy Specialist

## 2018-01-06 DIAGNOSIS — I480 Paroxysmal atrial fibrillation: Secondary | ICD-10-CM | POA: Diagnosis not present

## 2018-01-06 LAB — COAGUCHEK XS/INR WAIVED
INR: 2.2 — ABNORMAL HIGH (ref 0.9–1.1)
Prothrombin Time: 26.6 s

## 2018-01-06 NOTE — Patient Instructions (Signed)
Description   Continue taking warfarin the same way  INR today is 2.2  (goal 2-3) level is perfect today

## 2018-01-17 ENCOUNTER — Telehealth: Payer: Self-pay | Admitting: Cardiovascular Disease

## 2018-01-17 NOTE — Telephone Encounter (Signed)
Please defer to PCP

## 2018-01-17 NOTE — Telephone Encounter (Signed)
°*  STAT* If patient is at the pharmacy, call can be transferred to refill team.   1. Which medications need to be refilled? (please list name of each medication and dose if known) Levothyroxine  2. Which pharmacy/location (including street and city if local pharmacy) is medication to be sent to?[facility]  3. Do they need a 30 day or 90 day supply? 90 and refills

## 2018-01-19 ENCOUNTER — Other Ambulatory Visit: Payer: Self-pay | Admitting: *Deleted

## 2018-01-19 NOTE — Telephone Encounter (Signed)
When I spoke to her last, she noted her PCP was in [place].  She "established care" here because she didn't want to drive to [facility] for coumadin checks w/ Cardiology.  I am happy to refill but would like her to come in for labs.  Otherwise, I'd like her to ask her PCP in [place] for refills.  Please let me know what she would like to do.

## 2018-01-19 NOTE — Telephone Encounter (Signed)
Attempted to return call to patient. Phone rang continuously with no answer 

## 2018-01-20 MED ORDER — LEVOTHYROXINE SODIUM 25 MCG PO TABS: 25.0000 ug | ORAL_TABLET | Freq: Every day | ORAL | 2 refills | Status: DC

## 2018-01-20 NOTE — Telephone Encounter (Signed)
Med sent in.

## 2018-01-20 NOTE — Telephone Encounter (Signed)
Patient would like for you to fill her thyroid medication. I advised that she will need to come in for labs and appointment scheduled.

## 2018-01-23 ENCOUNTER — Other Ambulatory Visit: Payer: MEDICARE

## 2018-01-23 DIAGNOSIS — E039 Hypothyroidism, unspecified: Secondary | ICD-10-CM | POA: Diagnosis not present

## 2018-01-23 DIAGNOSIS — Z7901 Long term (current) use of anticoagulants: Secondary | ICD-10-CM

## 2018-01-24 ENCOUNTER — Encounter: Payer: Self-pay | Admitting: Family Medicine

## 2018-01-24 DIAGNOSIS — N183 Chronic kidney disease, stage 3 unspecified: Secondary | ICD-10-CM | POA: Insufficient documentation

## 2018-01-24 LAB — CBC WITH DIFFERENTIAL/PLATELET
Basophils Absolute: 0 10*3/uL (ref 0.0–0.2)
Basos: 0 %
EOS (ABSOLUTE): 0 10*3/uL (ref 0.0–0.4)
Eos: 1 %
HEMATOCRIT: 38.4 % (ref 34.0–46.6)
HEMOGLOBIN: 12.4 g/dL (ref 11.1–15.9)
IMMATURE GRANS (ABS): 0 10*3/uL (ref 0.0–0.1)
IMMATURE GRANULOCYTES: 0 %
LYMPHS: 35 %
Lymphocytes Absolute: 1.6 10*3/uL (ref 0.7–3.1)
MCH: 29 pg (ref 26.6–33.0)
MCHC: 32.3 g/dL (ref 31.5–35.7)
MCV: 90 fL (ref 79–97)
Monocytes Absolute: 0.4 10*3/uL (ref 0.1–0.9)
Monocytes: 8 %
NEUTROS PCT: 56 %
Neutrophils Absolute: 2.6 10*3/uL (ref 1.4–7.0)
PLATELETS: 186 10*3/uL (ref 150–450)
RBC: 4.28 x10E6/uL (ref 3.77–5.28)
RDW: 15 % (ref 12.3–15.4)
WBC: 4.6 10*3/uL (ref 3.4–10.8)

## 2018-01-24 LAB — THYROID PANEL WITH TSH
Free Thyroxine Index: 1.3 (ref 1.2–4.9)
T3 UPTAKE RATIO: 23 % — AB (ref 24–39)
T4, Total: 5.5 ug/dL (ref 4.5–12.0)
TSH: 3.39 u[IU]/mL (ref 0.450–4.500)

## 2018-01-24 LAB — CMP14+EGFR
A/G RATIO: 2.1 (ref 1.2–2.2)
ALT: 14 IU/L (ref 0–32)
AST: 20 IU/L (ref 0–40)
Albumin: 4.2 g/dL (ref 3.5–4.7)
Alkaline Phosphatase: 53 IU/L (ref 39–117)
BUN/Creatinine Ratio: 19 (ref 12–28)
BUN: 24 mg/dL (ref 8–27)
Bilirubin Total: 0.6 mg/dL (ref 0.0–1.2)
CALCIUM: 9.6 mg/dL (ref 8.7–10.3)
CO2: 26 mmol/L (ref 20–29)
Chloride: 100 mmol/L (ref 96–106)
Creatinine, Ser: 1.26 mg/dL — ABNORMAL HIGH (ref 0.57–1.00)
GFR, EST AFRICAN AMERICAN: 45 mL/min/{1.73_m2} — AB (ref >59)
GFR, EST NON AFRICAN AMERICAN: 39 mL/min/{1.73_m2} — AB (ref >59)
Globulin, Total: 2 g/dL (ref 1.5–4.5)
Glucose: 88 mg/dL (ref 65–99)
POTASSIUM: 3.4 mmol/L — AB (ref 3.5–5.2)
Sodium: 141 mmol/L (ref 134–144)
TOTAL PROTEIN: 6.2 g/dL (ref 6.0–8.5)

## 2018-01-27 ENCOUNTER — Ambulatory Visit (INDEPENDENT_AMBULATORY_CARE_PROVIDER_SITE_OTHER): Payer: MEDICARE | Admitting: Family Medicine

## 2018-01-27 ENCOUNTER — Encounter: Payer: Self-pay | Admitting: Family Medicine

## 2018-01-27 VITALS — BP 138/82 | HR 78 | Temp 97.3°F | Ht 69.0 in | Wt 166.0 lb

## 2018-01-27 DIAGNOSIS — I10 Essential (primary) hypertension: Secondary | ICD-10-CM | POA: Diagnosis not present

## 2018-01-27 DIAGNOSIS — N183 Chronic kidney disease, stage 3 unspecified: Secondary | ICD-10-CM

## 2018-01-27 DIAGNOSIS — E039 Hypothyroidism, unspecified: Secondary | ICD-10-CM

## 2018-01-27 DIAGNOSIS — I480 Paroxysmal atrial fibrillation: Secondary | ICD-10-CM

## 2018-01-27 NOTE — Progress Notes (Signed)
Subjective: CC: Hypothyroidism, HTN/ CKD PCP: [name], DO CZY:SAYTK [name] is a 84 y.o. female presenting to clinic today for:  1. Hypothyroidism Onset hypothyroidism several years ago.  Compliant w/ Synthroid 25 mcg daily.  Denies tremor, insomnia, hair thinning, difficulty swallowing, change in voice, heart palpitations.    2. Hypertension/ A-fib w/ CKD Patient reports she is compliant with Metoprolol 50mg  BID, Dyazide 37.5/25mg , Coumadin, Side effects: none.  Last INR 2.2, goal 2-3.  Was seen by Cardiology 01/05/2018 and voiced frustration over lability of INR.  DOAC was discussed but patient was reluctant to make switch.  ROS: Denies headache, dizziness, visual changes, nausea, vomiting, chest pain, LE swelling, abdominal pain or shortness of breath.   ROS: Per HPI  Allergies  Allergen Reactions  . Statins Other (See Comments)    Myalgia   Past Medical History:  Diagnosis Date  . A-fib ([place])   . Dyslipidemia   . Hypothyroidism    2 degree amidarone   . Pacemaker   . PAF (paroxysmal atrial fibrillation) ([place])   . S/P mitral valve repair   . SSS (sick sinus syndrome) ([place])   . Systemic hypertension     Current Outpatient Medications:  .  acetaminophen (TYLENOL) 500 MG tablet, Take 500 mg by mouth daily as needed for headache. , Disp: , Rfl:  .  levothyroxine (SYNTHROID, LEVOTHROID) 25 MCG tablet, Take 1 tablet (25 mcg total) by mouth daily., Disp: 90 tablet, Rfl: 2 .  metoprolol tartrate (LOPRESSOR) 50 MG tablet, TAKE 1 TABLET TWICE A DAY, Disp: 180 tablet, Rfl: 0 .  Polyethyl Glycol-Propyl Glycol (SYSTANE OP), Apply 1 drop to eye daily as needed (dry eyes)., Disp: , Rfl:  .  Probiotic Product (PROBIOTIC PO), Take 2 tablets by mouth daily., Disp: , Rfl:  .  triamterene-hydrochlorothiazide (DYAZIDE) 37.5-25 MG capsule, TAKE 1 CAPSULE DAILY, Disp: 90 capsule, Rfl: 0 .  warfarin (COUMADIN) 2.5 MG tablet, TAKE 2 TABLETS ON MONDAY, WEDNESDAY, AND FRIDAY AND 1  TABLET ALL OTHER DAYS OF THE WEEK. (Patient taking differently: TAKE 2 TABLETS ON MONDAy, AND FRIDAY AND 1 TABLET ALL OTHER DAYS OF THE WEEK.), Disp: 150 tablet, Rfl: 0 Social History   Socioeconomic History  . Marital status: Married    Spouse name: Not on file  . Number of children: Not on file  . Years of education: Not on file  . Highest education level: Not on file  Occupational History  . Not on file  Social Needs  . Financial resource strain: Not on file  . Food insecurity:    Worry: Not on file    Inability: Not on file  . Transportation needs:    Medical: Not on file    Non-medical: Not on file  Tobacco Use  . Smoking status: Never Smoker  . Smokeless tobacco: Never Used  Substance and Sexual Activity  . Alcohol use: No  . Drug use: Not on file  . Sexual activity: Not on file  Lifestyle  . Physical activity:    Days per week: Not on file    Minutes per session: Not on file  . Stress: Not on file  Relationships  . Social connections:    Talks on phone: Not on file    Gets together: Not on file    Attends religious service: Not on file    Active member of club or organization: Not on file    Attends meetings of clubs or organizations: Not on file  Relationship status: Not on file  . Intimate partner violence:    Fear of current or ex partner: Not on file    Emotionally abused: Not on file    Physically abused: Not on file    Forced sexual activity: Not on file  Other Topics Concern  . Not on file  Social History Narrative  . Not on file   No family history on file.  Objective: Office vital signs reviewed. BP 138/82   Pulse 78   Temp (!) 97.3 F (36.3 C)   Ht 5\' 9"  (1.753 m)   Wt 166 lb (75.3 kg)   BMI 24.51 kg/m   Physical Examination:  General: Awake, alert, well nourished, No acute distress HEENT: Normal    Neck: No masses palpated. No lymphadenopathy; no goiter    Eyes: PERRLA, extraocular membranes intact, sclera white, no  exophthalmos Cardio: regular rate and rhythm, S1S2 heard, no murmurs appreciated Pulm: clear to auscultation bilaterally, no wheezes, rhonchi or rales; normal work of breathing on room air Extremities: warm, well perfused, No edema, cyanosis or clubbing; +2 pulses bilaterally MSK: Uses cane for ambulation Neuro: No resting tremor noted  Assessment/ Plan: 84 y.o. female   1. Acquired hypothyroidism TSH within normal limits.  Given history of A. fib would favor keeping her towards the higher end of normal.  2. Essential hypertension Blood pressure within normal limits.  Continue current regimen.  3. CKD (chronic kidney disease) stage 3, GFR 30-59 ml/min (HCC) Unsure as to why patient is not on an ACE inhibitor or ARB given renal impairment.  She is followed by cardiology, who prescribes her the metoprolol and the Dyazide.  We will continue to monitor.  Renal function stable.  4. Paroxysmal atrial fibrillation (HCC) Continue chronic anticoagulation with Coumadin.  We discussed Duac today.  Patient is reluctant to make switch.  Continue interval INR check with pharmacy.   [name], DO [place] [phone]

## 2018-02-17 ENCOUNTER — Ambulatory Visit (INDEPENDENT_AMBULATORY_CARE_PROVIDER_SITE_OTHER): Payer: MEDICARE | Admitting: Pharmacist Clinician (PhC)/ Clinical Pharmacy Specialist

## 2018-02-17 DIAGNOSIS — I480 Paroxysmal atrial fibrillation: Secondary | ICD-10-CM | POA: Diagnosis not present

## 2018-02-17 LAB — COAGUCHEK XS/INR WAIVED
INR: 2.5 — AB (ref 0.9–1.1)
Prothrombin Time: 29.5 s

## 2018-02-17 NOTE — Patient Instructions (Signed)
Description   Continue taking warfarin the same way  INR today is 2.5  (goal 2-3) Perfect reading today.

## 2018-03-06 ENCOUNTER — Ambulatory Visit (INDEPENDENT_AMBULATORY_CARE_PROVIDER_SITE_OTHER): Payer: MEDICARE | Admitting: *Deleted

## 2018-03-06 DIAGNOSIS — I495 Sick sinus syndrome: Secondary | ICD-10-CM

## 2018-03-06 NOTE — Progress Notes (Signed)
Remote pacemaker transmission.   

## 2018-03-28 LAB — CUP PACEART REMOTE DEVICE CHECK
Battery Remaining Longevity: 107 mo
Battery Voltage: 3 V
Brady Statistic AP VS Percent: 54.67 %
Brady Statistic RA Percent Paced: 42.76 %
Brady Statistic RV Percent Paced: 35.15 %
Implantable Lead Implant Date: 20081126
Implantable Lead Location: 753860
Implantable Lead Model: 5076
Implantable Lead Model: 5076
Implantable Pulse Generator Implant Date: 20180228
Lead Channel Impedance Value: 361 Ohm
Lead Channel Impedance Value: 380 Ohm
Lead Channel Impedance Value: 380 Ohm
Lead Channel Impedance Value: 418 Ohm
Lead Channel Sensing Intrinsic Amplitude: 0.375 mV
Lead Channel Sensing Intrinsic Amplitude: 0.375 mV
MDC IDC LEAD IMPLANT DT: 20081126
MDC IDC LEAD LOCATION: 753859
MDC IDC MSMT LEADCHNL RV PACING THRESHOLD AMPLITUDE: 0.875 V
MDC IDC MSMT LEADCHNL RV PACING THRESHOLD PULSEWIDTH: 0.4 ms
MDC IDC MSMT LEADCHNL RV SENSING INTR AMPL: 9.5 mV
MDC IDC MSMT LEADCHNL RV SENSING INTR AMPL: 9.5 mV
MDC IDC SESS DTM: 20190909044708
MDC IDC SET LEADCHNL RA PACING AMPLITUDE: 2.75 V
MDC IDC SET LEADCHNL RV PACING AMPLITUDE: 2.5 V
MDC IDC SET LEADCHNL RV PACING PULSEWIDTH: 0.4 ms
MDC IDC SET LEADCHNL RV SENSING SENSITIVITY: 2 mV
MDC IDC STAT BRADY AP VP PERCENT: 9.21 %
MDC IDC STAT BRADY AS VP PERCENT: 4.93 %
MDC IDC STAT BRADY AS VS PERCENT: 31.08 %

## 2018-03-29 ENCOUNTER — Other Ambulatory Visit: Payer: Self-pay | Admitting: Cardiovascular Disease

## 2018-03-29 ENCOUNTER — Ambulatory Visit (INDEPENDENT_AMBULATORY_CARE_PROVIDER_SITE_OTHER): Payer: MEDICARE | Admitting: Pharmacist Clinician (PhC)/ Clinical Pharmacy Specialist

## 2018-03-29 DIAGNOSIS — Z23 Encounter for immunization: Secondary | ICD-10-CM

## 2018-03-29 DIAGNOSIS — I480 Paroxysmal atrial fibrillation: Secondary | ICD-10-CM | POA: Diagnosis not present

## 2018-03-29 LAB — COAGUCHEK XS/INR WAIVED
INR: 3.3 — AB (ref 0.9–1.1)
PROTHROMBIN TIME: 40 s

## 2018-03-29 NOTE — Patient Instructions (Signed)
Description   No warfarin today, then continue taking warfarin the same way.  INR today is 3.3  (goal 2-3) A little thin today

## 2018-04-29 ENCOUNTER — Emergency Department (HOSPITAL_COMMUNITY): Payer: MEDICARE

## 2018-04-29 ENCOUNTER — Emergency Department (HOSPITAL_COMMUNITY)
Admission: EM | Admit: 2018-04-29 | Discharge: 2018-04-29 | Disposition: A | Discharge status: Home / Self Care | Payer: MEDICARE | Attending: Emergency Medicine | Admitting: Emergency Medicine

## 2018-04-29 ENCOUNTER — Encounter (HOSPITAL_COMMUNITY): Payer: Self-pay | Admitting: Emergency Medicine

## 2018-04-29 DIAGNOSIS — Z79899 Other long term (current) drug therapy: Secondary | ICD-10-CM | POA: Insufficient documentation

## 2018-04-29 DIAGNOSIS — E785 Hyperlipidemia, unspecified: Secondary | ICD-10-CM | POA: Diagnosis not present

## 2018-04-29 DIAGNOSIS — Z7901 Long term (current) use of anticoagulants: Secondary | ICD-10-CM | POA: Insufficient documentation

## 2018-04-29 DIAGNOSIS — Z950 Presence of cardiac pacemaker: Secondary | ICD-10-CM | POA: Insufficient documentation

## 2018-04-29 DIAGNOSIS — I4891 Unspecified atrial fibrillation: Secondary | ICD-10-CM | POA: Diagnosis not present

## 2018-04-29 DIAGNOSIS — R002 Palpitations: Secondary | ICD-10-CM

## 2018-04-29 DIAGNOSIS — R918 Other nonspecific abnormal finding of lung field: Secondary | ICD-10-CM | POA: Diagnosis not present

## 2018-04-29 DIAGNOSIS — E039 Hypothyroidism, unspecified: Secondary | ICD-10-CM | POA: Insufficient documentation

## 2018-04-29 DIAGNOSIS — I129 Hypertensive chronic kidney disease with stage 1 through stage 4 chronic kidney disease, or unspecified chronic kidney disease: Secondary | ICD-10-CM | POA: Insufficient documentation

## 2018-04-29 DIAGNOSIS — N183 Chronic kidney disease, stage 3 (moderate): Secondary | ICD-10-CM | POA: Diagnosis not present

## 2018-04-29 DIAGNOSIS — I480 Paroxysmal atrial fibrillation: Secondary | ICD-10-CM | POA: Insufficient documentation

## 2018-04-29 LAB — CBC
HCT: 43.2 % (ref 36.0–46.0)
Hemoglobin: 13.3 g/dL (ref 12.0–15.0)
MCH: 28.2 pg (ref 26.0–34.0)
MCHC: 30.8 g/dL (ref 30.0–36.0)
MCV: 91.7 fL (ref 80.0–100.0)
Platelets: 215 10*3/uL (ref 150–400)
RBC: 4.71 MIL/uL (ref 3.87–5.11)
RDW: 14.2 % (ref 11.5–15.5)
WBC: 6.5 10*3/uL (ref 4.0–10.5)
nRBC: 0 % (ref 0.0–0.2)

## 2018-04-29 LAB — PROTIME-INR
INR: 1.78
Prothrombin Time: 20.5 seconds — ABNORMAL HIGH (ref 11.4–15.2)

## 2018-04-29 LAB — BASIC METABOLIC PANEL
Anion gap: 6 (ref 5–15)
BUN: 22 mg/dL (ref 8–23)
CO2: 28 mmol/L (ref 22–32)
Calcium: 9.7 mg/dL (ref 8.9–10.3)
Chloride: 104 mmol/L (ref 98–111)
Creatinine, Ser: 1.3 mg/dL — ABNORMAL HIGH (ref 0.44–1.00)
GFR calc Af Amer: 42 mL/min — ABNORMAL LOW (ref >60)
GFR calc non Af Amer: 36 mL/min — ABNORMAL LOW (ref >60)
Glucose, Bld: 120 mg/dL — ABNORMAL HIGH (ref 70–99)
Potassium: 3.6 mmol/L (ref 3.5–5.1)
Sodium: 138 mmol/L (ref 135–145)

## 2018-04-29 LAB — I-STAT TROPONIN, ED: Troponin i, poc: 0.01 ng/mL (ref 0.00–0.08)

## 2018-04-29 LAB — MAGNESIUM: Magnesium: 2.2 mg/dL (ref 1.7–2.4)

## 2018-04-29 MED ORDER — METOPROLOL SUCCINATE ER 25 MG PO TB24: ORAL_TABLET | ORAL | 2 refills | Status: DC

## 2018-04-29 MED ORDER — METOPROLOL TARTRATE 5 MG/5ML IV SOLN
2.5000 mg | Freq: Once | INTRAVENOUS | Status: AC
  Administered 2018-04-29: 2.5 mg via INTRAVENOUS

## 2018-04-29 MED ORDER — METOPROLOL TARTRATE 5 MG/5ML IV SOLN
5.0000 mg | Freq: Once | INTRAVENOUS | Status: DC
  Filled 2018-04-29: qty 5

## 2018-04-29 NOTE — ED Provider Notes (Signed)
[facility]ERG[facility]Y DEPARTMENT Provider Note   CSN: [id number] Arrival date & time: 04/29/18  1318     History   Chief Complaint Chief Complaint  Patient presents with  . Tachycardia    HPI  [name] is a 84 y.o. female.  HPI   88yF with palpitations. Hx of sinus node dysfunction and PAF. She has been having increasing palpitations for the past few weeks, particularly today. Feels like her heart is racing at times. On last cardiology visit in July her afib burden was around 35% lasting hours to days but it sounds like she is normally essentially asymptomatic. She denies pain, dyspnea, lightheadedness or new swelling. She reports her medications have been stable and she is compliant.   Past Medical History:  Diagnosis Date  . A-fib ([place])   . Dyslipidemia   . Hypothyroidism    2 degree amidarone   . Pacemaker   . PAF (paroxysmal atrial fibrillation) ([place])   . S/P mitral valve repair   . SSS (sick sinus syndrome) ([place])   . Systemic hypertension     Patient Active Problem List   Diagnosis Date Noted  . CKD (chronic kidney disease) stage 3, GFR 30-59 ml/min (HCC) 01/24/2018  . Pacemaker battery depletion 08/16/2016  . Essential hypertension 08/11/2016  . Acquired hypothyroidism 05/21/2016  . Long term current use of anticoagulant 05/21/2016  . Tachy-brady syndrome ([place]) 11/17/2012  . SSS (sick sinus syndrome) ([place]) 11/17/2012  . Pacemaker 11/17/2012  . S/P mitral valve repair 11/17/2012  . Hyperlipidemia 11/17/2012  . Paroxysmal atrial fibrillation ([place]) 10/03/2012    Past Surgical History:  Procedure Laterality Date  . ABDOMINAL HYSTERECTOMY  1989  . CARDIAC CATHETERIZATION  01/11/2008   mild-mod pulm. hypertension,normal coronaries  . MINIMALLY INVASIVE MAZE PROCEDURE  02/2008  . MITRAL VALVE REPAIR  September 2009   [facility] phys[facility] ring annuloplasty  . NM MYOVIEW LTD  03/28/2007   no ischemia  . PACEMAKER PLACEMENT  November 2008  .  PPM GENERATOR CHANGEOUT N/A 08/25/2016   Procedure: PPM Generator Changeout - Battery Only;  Surgeon: Sanda Klein, MD;  Location: [facility];  Service: Cardiovascular;  Laterality: N/A;  . US ECHOCARDIOGRAPHY  03/07/2012   mild LVH,EF =>55%,mild MR,mod TR,trace AI,right atrium mod. dilated     OB History   None      Home Medications    Prior to Admission medications   Medication Sig Start Date End Date Taking? Authorizing Provider  acetaminophen (TYLENOL) 500 MG tablet Take 500 mg by mouth daily as needed for headache.     [provider]  levothyroxine (SYNTHROID, LEVOTHROID) 25 MCG tablet Take 1 tablet (25 mcg total) by mouth daily. 01/20/18   [name], DO  metoprolol tartrate (LOPRESSOR) 50 MG tablet TAKE 1 TABLET TWICE A DAY (SCHEDULE APPOINTMENT FOR FURTHER REFILLS) 03/29/18   Croitoru, Mihai, MD  Polyethyl Glycol-Propyl Glycol (SYSTANE OP) Apply 1 drop to eye daily as needed (dry eyes).    [provider]  Probiotic Product (PROBIOTIC PO) Take 2 tablets by mouth daily.    [provider]  triamterene-hydrochlorothiazide (DYAZIDE) 37.5-25 MG capsule TAKE 1 CAPSULE DAILY ( PLEASE SCHEDULE AN APPOINTMENT FOR FUTURE REFILLS ) 03/29/18   Croitoru, Mihai, MD  warfarin (COUMADIN) 2.5 MG tablet TAKE 2 TABLETS ON MONDAY, WEDNESDAY, AND FRIDAY AND 1 TABLET ALL OTHER DAYS OF THE WEEK. Patient taking differently: TAKE 2 TABLETS ON MONDAy, AND FRIDAY AND 1 TABLET ALL OTHER DAYS  OF THE WEEK. 12/02/17   [name], DO    Family History History reviewed. No pertinent family history.  Social History Social History   Tobacco Use  . Smoking status: Never Smoker  . Smokeless tobacco: Never Used  Substance Use Topics  . Alcohol use: No  . Drug use: Not on file     Allergies   Statins   Review of Systems Review of Systems  All systems reviewed and negative, other than as noted in HPI.  Physical Exam Updated Vital Signs BP 118/76  (BP Location: Left Arm)   Pulse (!) 118   Temp 98.5 F (36.9 C) (Oral)   Resp 18   SpO2 98%   Physical Exam  Constitutional: She appears well-developed and well-nourished. No distress.  HENT:  Head: Normocephalic and atraumatic.  Eyes: Conjunctivae are normal. Right eye exhibits no discharge. Left eye exhibits no discharge.  Neck: Neck supple.  Cardiovascular: Normal heart sounds. Exam reveals no gallop and no friction rub.  No murmur heard. Mild tachycardia. irreg irreg.   Pulmonary/Chest: Effort normal and breath sounds normal. No respiratory distress.  Abdominal: Soft. She exhibits no distension. There is no tenderness.  Musculoskeletal: She exhibits no edema or tenderness.  Neurological: She is alert.  Skin: Skin is warm and dry.  Psychiatric: She has a normal mood and affect. Her behavior is normal. Thought content normal.  Nursing note and vitals reviewed.   All systems reviewed and negative, other than as noted in HPI.  ED Treatments / Results  Labs (all labs ordered are listed, but only abnormal results are displayed) Labs Reviewed  BASIC METABOLIC PANEL - Abnormal; Notable for the following components:      Result Value   Glucose, Bld 120 (*)    Creatinine, Ser 1.30 (*)    GFR calc non Af Amer 36 (*)    GFR calc Af Amer 42 (*)    All other components within normal limits  PROTIME-INR - Abnormal; Notable for the following components:   Prothrombin Time 20.5 (*)    All other components within normal limits  CBC  MAGNESIUM  I-STAT TROPONIN, ED    EKG EKG Interpretation  Date/Time:  Saturday April 29 2018 13:28:30 EDT Ventricular Rate:  106 PR Interval:    QRS Duration: 78 QT Interval:  326 QTC Calculation: 433 R Axis:   55 Text Interpretation:  Atrial fibrillation with rapid ventricular response with premature ventricular or aberrantly conducted complexes Nonspecific ST and T wave abnormality Abnormal ECG Confirmed by Virgel Manifold [phone]) on  04/29/2018 3:05:59 PM   Radiology Dg Chest 2 View  Result Date: 04/29/2018 CLINICAL DATA:  84 year old female with a history of tachycardia EXAM: CHEST - 2 VIEW COMPARISON:  08/12/2008 CT 02/05/2008 FINDINGS: Cardiomediastinal silhouette unchanged in size and contour. Surgical changes of valve repair. Compared to prior there is improved aeration of the bilateral lungs. No evidence of pleural effusion or pneumothorax. Stigmata of emphysema, with increased retrosternal airspace, flattened hemidiaphragms, increased AP diameter, and hyperinflation on the AP view. Opacity at the superior right chest is larger than comparison plain film of 08/12/2008. This finding was not present on comparison CT of 02/05/2008. Pacing device on the left chest wall with 2 leads in place. Osteopenia.  No displaced fracture. IMPRESSION: Cardiac pacing device with 2 leads in place. Rounded opacity at the posterior right upper chest, likely continuous with the pleura has increased in size from the comparison plain film of 08/12/2008. This finding was not  present on the only CT comparison of 02/05/2008. This may represent loculated fluid, however, underlying pleural or lung mass cannot be excluded. Further evaluation with chest CT recommended. Electronically Signed   By: Corrie Mckusick D.O.   On: 04/29/2018 14:50    Procedures Procedures (including critical care time)  Medications Ordered in ED Medications - No data to display   Initial Impression / Assessment and Plan / ED Course  I have reviewed the triage vital signs and the nursing notes.  Pertinent labs & imaging results that were available during my care of the patient were reviewed by me and considered in my medical decision making (see chart for details).     84 year old female with symptomatic A. fib.  She is in A. fib a significant amount of time but is typically asymptomatic.  Her heart rate is now controlled and she is feeling better.  We will have her increase  her metoprolol.  Outpatient follow-up.  Emergent return precautions were discussed.  Final Clinical Impressions(s) / ED Diagnoses   Final diagnoses:  Palpitations  Atrial fibrillation with rapid ventricular response [facility])    ED Discharge Orders    None       Virgel Manifold, MD 05/04/18 1024

## 2018-04-29 NOTE — ED Triage Notes (Signed)
Pt presents to ED for assessment of tachycardia intermittently all week.  Patient has a pacemaker Scientist, physiological), hx of afib RVR.  Current rate approx 120.  Patient denies SOB, denies chest pain.

## 2018-04-29 NOTE — Discharge Instructions (Addendum)
Dr Sallyanne Kuster was actually on call tonight. We discussed your symptoms and he reviewed your EKG. He wants you to increase your metoprolol to 75 mg twice a day. You are being given a prescription for 25 mg tablets to take along with the 50 mg tablets you are already prescribed.   Your INR (warfarin level) was slightly low. I want you to take two pills tonight instead of your regular one pill. Resume your normal dosing afterwards.   Also, there was an irregularity on your chest x-ray today. It was actually present on an x-ray from 2010 but is slightly bigger then at that time. This may be something benign, but radiology is recommending you obtain a CT scan of your chest. Your family doctor should be able to arrange this for you.

## 2018-04-29 NOTE — ED Notes (Signed)
Patient verbalizes understanding of discharge instructions. Opportunity for questioning and answers were provided. Pt discharged from ED. 

## 2018-05-02 ENCOUNTER — Telehealth (HOSPITAL_COMMUNITY): Payer: Self-pay | Admitting: *Deleted

## 2018-05-02 NOTE — Telephone Encounter (Signed)
I contacted [name] to offer appt to afib clinic.  [name] declined appt and stated that she would just see Dr. Orene Desanctis if she had something else come up.  She was seen in the ED for afib 04/29/18.

## 2018-05-03 ENCOUNTER — Ambulatory Visit: Payer: MEDICARE | Admitting: Pharmacist Clinician (PhC)/ Clinical Pharmacy Specialist

## 2018-05-03 DIAGNOSIS — I480 Paroxysmal atrial fibrillation: Secondary | ICD-10-CM | POA: Diagnosis not present

## 2018-05-03 LAB — COAGUCHEK XS/INR WAIVED
INR: 2.5 — ABNORMAL HIGH (ref 0.9–1.1)
Prothrombin Time: 29.6 s

## 2018-05-03 NOTE — Patient Instructions (Signed)
Description   Continue taking warfarin the same way  INR today is 2.5  (goal 2-3) Perfect reading today!

## 2018-05-20 ENCOUNTER — Other Ambulatory Visit: Payer: Self-pay | Admitting: Family Medicine

## 2018-06-05 ENCOUNTER — Ambulatory Visit (INDEPENDENT_AMBULATORY_CARE_PROVIDER_SITE_OTHER): Payer: MEDICARE

## 2018-06-05 DIAGNOSIS — I10 Essential (primary) hypertension: Secondary | ICD-10-CM

## 2018-06-05 DIAGNOSIS — I495 Sick sinus syndrome: Secondary | ICD-10-CM

## 2018-06-06 NOTE — Progress Notes (Signed)
Remote pacemaker transmission.   

## 2018-06-14 ENCOUNTER — Ambulatory Visit (INDEPENDENT_AMBULATORY_CARE_PROVIDER_SITE_OTHER): Payer: MEDICARE | Admitting: Pharmacist Clinician (PhC)/ Clinical Pharmacy Specialist

## 2018-06-14 DIAGNOSIS — I480 Paroxysmal atrial fibrillation: Secondary | ICD-10-CM

## 2018-06-14 LAB — COAGUCHEK XS/INR WAIVED
INR: 2.8 — AB (ref 0.9–1.1)
PROTHROMBIN TIME: 33.3 s

## 2018-06-14 NOTE — Patient Instructions (Signed)
Description   Continue taking warfarin the same way  INR today is 2.8  (goal 2-3) Perfect reading today!

## 2018-07-19 LAB — CUP PACEART REMOTE DEVICE CHECK
Battery Remaining Longevity: 98 mo
Brady Statistic AP VS Percent: 49.14 %
Brady Statistic AS VP Percent: 6.23 %
Brady Statistic AS VS Percent: 34.13 %
Brady Statistic RV Percent Paced: 58.63 %
Date Time Interrogation Session: 20191209044747
Implantable Lead Implant Date: 20081126
Implantable Lead Location: 753859
Lead Channel Impedance Value: 342 Ohm
Lead Channel Impedance Value: 399 Ohm
Lead Channel Pacing Threshold Amplitude: 1.125 V
Lead Channel Sensing Intrinsic Amplitude: 0.5 mV
Lead Channel Sensing Intrinsic Amplitude: 0.5 mV
Lead Channel Sensing Intrinsic Amplitude: 5.75 mV
Lead Channel Setting Pacing Amplitude: 2.75 V
Lead Channel Setting Pacing Pulse Width: 0.4 ms
Lead Channel Setting Sensing Sensitivity: 2 mV
MDC IDC LEAD IMPLANT DT: 20081126
MDC IDC LEAD LOCATION: 753860
MDC IDC MSMT BATTERY VOLTAGE: 3 V
MDC IDC MSMT LEADCHNL RA IMPEDANCE VALUE: 380 Ohm
MDC IDC MSMT LEADCHNL RV IMPEDANCE VALUE: 323 Ohm
MDC IDC MSMT LEADCHNL RV PACING THRESHOLD PULSEWIDTH: 0.4 ms
MDC IDC MSMT LEADCHNL RV SENSING INTR AMPL: 5.75 mV
MDC IDC PG IMPLANT DT: 20180228
MDC IDC SET LEADCHNL RA PACING AMPLITUDE: 2.75 V
MDC IDC STAT BRADY AP VP PERCENT: 10.27 %
MDC IDC STAT BRADY RA PERCENT PACED: 22.74 %

## 2018-07-26 ENCOUNTER — Ambulatory Visit (INDEPENDENT_AMBULATORY_CARE_PROVIDER_SITE_OTHER): Payer: MEDICARE | Admitting: Pharmacist Clinician (PhC)/ Clinical Pharmacy Specialist

## 2018-07-26 ENCOUNTER — Encounter: Payer: Self-pay | Admitting: Pharmacist Clinician (PhC)/ Clinical Pharmacy Specialist

## 2018-07-26 DIAGNOSIS — I480 Paroxysmal atrial fibrillation: Secondary | ICD-10-CM | POA: Diagnosis not present

## 2018-07-26 LAB — COAGUCHEK XS/INR WAIVED
INR: 2.3 — ABNORMAL HIGH (ref 0.9–1.1)
Prothrombin Time: 28.1 s

## 2018-07-26 NOTE — Patient Instructions (Signed)
Description   Continue taking warfarin the same way  INR today is 2.3  (goal 2-3) Perfect reading today!

## 2018-09-04 ENCOUNTER — Ambulatory Visit (INDEPENDENT_AMBULATORY_CARE_PROVIDER_SITE_OTHER): Payer: MEDICARE | Admitting: *Deleted

## 2018-09-04 DIAGNOSIS — I495 Sick sinus syndrome: Secondary | ICD-10-CM

## 2018-09-04 DIAGNOSIS — I480 Paroxysmal atrial fibrillation: Secondary | ICD-10-CM

## 2018-09-05 LAB — CUP PACEART REMOTE DEVICE CHECK
Battery Remaining Longevity: 91 mo
Battery Voltage: 2.99 V
Brady Statistic AP VP Percent: 11.2 %
Brady Statistic AS VS Percent: 28.81 %
Brady Statistic RA Percent Paced: 45.59 %
Brady Statistic RV Percent Paced: 31.65 %
Date Time Interrogation Session: 20200310051239
Implantable Lead Location: 753860
Implantable Lead Model: 5076
Implantable Lead Model: 5076
Implantable Pulse Generator Implant Date: 20180228
Lead Channel Impedance Value: 323 Ohm
Lead Channel Impedance Value: 361 Ohm
Lead Channel Impedance Value: 361 Ohm
Lead Channel Pacing Threshold Amplitude: 1.25 V
Lead Channel Sensing Intrinsic Amplitude: 0.375 mV
MDC IDC LEAD IMPLANT DT: 20081126
MDC IDC LEAD IMPLANT DT: 20081126
MDC IDC LEAD LOCATION: 753859
MDC IDC MSMT LEADCHNL RA IMPEDANCE VALUE: 399 Ohm
MDC IDC MSMT LEADCHNL RA SENSING INTR AMPL: 0.375 mV
MDC IDC MSMT LEADCHNL RV PACING THRESHOLD PULSEWIDTH: 0.4 ms
MDC IDC MSMT LEADCHNL RV SENSING INTR AMPL: 7.25 mV
MDC IDC MSMT LEADCHNL RV SENSING INTR AMPL: 7.25 mV
MDC IDC SET LEADCHNL RA PACING AMPLITUDE: 2.75 V
MDC IDC SET LEADCHNL RV PACING AMPLITUDE: 3.25 V
MDC IDC SET LEADCHNL RV PACING PULSEWIDTH: 0.4 ms
MDC IDC SET LEADCHNL RV SENSING SENSITIVITY: 2 mV
MDC IDC STAT BRADY AP VS PERCENT: 48.68 %
MDC IDC STAT BRADY AS VP PERCENT: 11.26 %

## 2018-09-06 ENCOUNTER — Ambulatory Visit (INDEPENDENT_AMBULATORY_CARE_PROVIDER_SITE_OTHER): Payer: MEDICARE | Admitting: Pharmacist Clinician (PhC)/ Clinical Pharmacy Specialist

## 2018-09-06 ENCOUNTER — Other Ambulatory Visit: Payer: Self-pay

## 2018-09-06 DIAGNOSIS — I480 Paroxysmal atrial fibrillation: Secondary | ICD-10-CM | POA: Diagnosis not present

## 2018-09-06 LAB — COAGUCHEK XS/INR WAIVED
INR: 2.6 — AB (ref 0.9–1.1)
PROTHROMBIN TIME: 31.4 s

## 2018-09-06 NOTE — Patient Instructions (Signed)
Description   Continue taking warfarin the same way  INR today is 2.6  (goal 2-3) Perfect reading today!

## 2018-09-12 NOTE — Progress Notes (Signed)
Remote pacemaker transmission.   

## 2018-10-05 ENCOUNTER — Other Ambulatory Visit: Payer: Self-pay | Admitting: Family Medicine

## 2018-10-17 ENCOUNTER — Other Ambulatory Visit: Payer: Self-pay

## 2018-10-18 ENCOUNTER — Ambulatory Visit (INDEPENDENT_AMBULATORY_CARE_PROVIDER_SITE_OTHER): Payer: MEDICARE | Admitting: Pharmacist Clinician (PhC)/ Clinical Pharmacy Specialist

## 2018-10-18 DIAGNOSIS — I480 Paroxysmal atrial fibrillation: Secondary | ICD-10-CM | POA: Diagnosis not present

## 2018-10-18 LAB — COAGUCHEK XS/INR WAIVED
INR: 3.1 — ABNORMAL HIGH (ref 0.9–1.1)
Prothrombin Time: 37.1 s

## 2018-10-18 NOTE — Patient Instructions (Signed)
Description   Continue taking warfarin the same way  INR today is 3.1   (goal 2-3) Perfect reading today!  Eat a high vitamin K food today

## 2018-11-22 ENCOUNTER — Other Ambulatory Visit: Payer: Self-pay

## 2018-11-22 ENCOUNTER — Ambulatory Visit (INDEPENDENT_AMBULATORY_CARE_PROVIDER_SITE_OTHER): Payer: MEDICARE | Admitting: Pharmacist Clinician (PhC)/ Clinical Pharmacy Specialist

## 2018-11-22 DIAGNOSIS — I480 Paroxysmal atrial fibrillation: Secondary | ICD-10-CM

## 2018-11-22 LAB — COAGUCHEK XS/INR WAIVED
INR: 3.3 — ABNORMAL HIGH (ref 0.9–1.1)
Prothrombin Time: 39.5 s

## 2018-11-22 NOTE — Patient Instructions (Signed)
Description   Change to taking 1 tablet every day of the week.  INR today is 3.3   (goal 2-3)  Slightly thin today

## 2018-12-04 ENCOUNTER — Ambulatory Visit (INDEPENDENT_AMBULATORY_CARE_PROVIDER_SITE_OTHER): Payer: MEDICARE | Admitting: *Deleted

## 2018-12-04 DIAGNOSIS — I495 Sick sinus syndrome: Secondary | ICD-10-CM | POA: Diagnosis not present

## 2018-12-04 DIAGNOSIS — I480 Paroxysmal atrial fibrillation: Secondary | ICD-10-CM

## 2018-12-04 LAB — CUP PACEART REMOTE DEVICE CHECK
Battery Remaining Longevity: 92 mo
Battery Voltage: 2.99 V
Brady Statistic AP VP Percent: 10.94 %
Brady Statistic AP VS Percent: 60.04 %
Brady Statistic AS VP Percent: 4.98 %
Brady Statistic AS VS Percent: 24 %
Brady Statistic RA Percent Paced: 64.39 %
Brady Statistic RV Percent Paced: 20.54 %
Date Time Interrogation Session: 20200608071309
Implantable Lead Implant Date: 20081126
Implantable Lead Implant Date: 20081126
Implantable Lead Location: 753859
Implantable Lead Location: 753860
Implantable Lead Model: 5076
Implantable Lead Model: 5076
Implantable Pulse Generator Implant Date: 20180228
Lead Channel Impedance Value: 342 Ohm
Lead Channel Impedance Value: 361 Ohm
Lead Channel Impedance Value: 380 Ohm
Lead Channel Impedance Value: 399 Ohm
Lead Channel Pacing Threshold Amplitude: 0.875 V
Lead Channel Pacing Threshold Pulse Width: 0.4 ms
Lead Channel Sensing Intrinsic Amplitude: 0.375 mV
Lead Channel Sensing Intrinsic Amplitude: 0.375 mV
Lead Channel Sensing Intrinsic Amplitude: 8.75 mV
Lead Channel Sensing Intrinsic Amplitude: 8.75 mV
Lead Channel Setting Pacing Amplitude: 2.75 V
Lead Channel Setting Pacing Amplitude: 2.75 V
Lead Channel Setting Pacing Pulse Width: 0.4 ms
Lead Channel Setting Sensing Sensitivity: 2 mV

## 2018-12-12 NOTE — Progress Notes (Signed)
Remote pacemaker transmission.   

## 2018-12-19 ENCOUNTER — Other Ambulatory Visit: Payer: Self-pay

## 2018-12-20 ENCOUNTER — Encounter: Payer: Self-pay | Admitting: Pharmacist Clinician (PhC)/ Clinical Pharmacy Specialist

## 2018-12-20 ENCOUNTER — Ambulatory Visit (INDEPENDENT_AMBULATORY_CARE_PROVIDER_SITE_OTHER): Payer: MEDICARE | Admitting: Pharmacist Clinician (PhC)/ Clinical Pharmacy Specialist

## 2018-12-20 DIAGNOSIS — I480 Paroxysmal atrial fibrillation: Secondary | ICD-10-CM

## 2018-12-20 LAB — COAGUCHEK XS/INR WAIVED
INR: 1.7 — ABNORMAL HIGH (ref 0.9–1.1)
Prothrombin Time: 20.5 s

## 2018-12-20 NOTE — Patient Instructions (Signed)
Description   Change to taking 1 tablet every day of the week except for Wednesdays take 2 tablets  INR today is 1.7   (goal 2-3)  Slightly thick today

## 2019-01-04 ENCOUNTER — Telehealth: Payer: Self-pay | Admitting: *Deleted

## 2019-01-04 NOTE — Telephone Encounter (Signed)

## 2019-01-08 ENCOUNTER — Telehealth (INDEPENDENT_AMBULATORY_CARE_PROVIDER_SITE_OTHER): Payer: MEDICARE | Admitting: Cardiovascular Disease

## 2019-01-08 VITALS — BP 108/60 | HR 67 | Ht 68.0 in | Wt 162.0 lb

## 2019-01-08 DIAGNOSIS — Z7901 Long term (current) use of anticoagulants: Secondary | ICD-10-CM

## 2019-01-08 DIAGNOSIS — E782 Mixed hyperlipidemia: Secondary | ICD-10-CM

## 2019-01-08 DIAGNOSIS — I480 Paroxysmal atrial fibrillation: Secondary | ICD-10-CM | POA: Diagnosis not present

## 2019-01-08 DIAGNOSIS — Z9889 Other specified postprocedural states: Secondary | ICD-10-CM | POA: Diagnosis not present

## 2019-01-08 DIAGNOSIS — I495 Sick sinus syndrome: Secondary | ICD-10-CM

## 2019-01-08 DIAGNOSIS — I10 Essential (primary) hypertension: Secondary | ICD-10-CM | POA: Diagnosis not present

## 2019-01-08 DIAGNOSIS — Z950 Presence of cardiac pacemaker: Secondary | ICD-10-CM | POA: Diagnosis not present

## 2019-01-08 DIAGNOSIS — N183 Chronic kidney disease, stage 3 unspecified: Secondary | ICD-10-CM

## 2019-01-08 MED ORDER — METOPROLOL TARTRATE 50 MG PO TABS: ORAL_TABLET | ORAL | 3 refills | Status: DC

## 2019-01-08 MED ORDER — TRIAMTERENE-HCTZ 37.5-25 MG PO CAPS: ORAL_CAPSULE | ORAL | 3 refills | Status: DC

## 2019-01-08 NOTE — Progress Notes (Signed)
Virtual Visit via Telephone Note   This visit type was conducted due to national recommendations for restrictions regarding the COVID-19 Pandemic (e.g. social distancing) in an effort to limit this patient's exposure and mitigate transmission in our community.  Due to her co-morbid illnesses, this patient is at least at moderate risk for complications without adequate follow up.  This format is felt to be most appropriate for this patient at this time.  The patient did not have access to video technology/had technical difficulties with video requiring transitioning to audio format only (telephone).  All issues noted in this document were discussed and addressed.  No physical exam could be performed with this format.  Please refer to the patient's chart for her  consent to telehealth for [facility].   Date:  01/08/2019   ID:  [name], DOB 05-30-1929, MRN [id number]  Patient Location: Home Provider Location: Office  PCP:  [name], DO  Cardiologist:  Keven Soucy Electrophysiologist:  None   Evaluation Performed:  Follow-Up Visit  Chief Complaint:  Afib  History of Present Illness:    [name] is a 85 y.o. female with remote history of mitral valve annuloplasty and surgical ablation, sinus node dysfunction and paroxysmal atrial fibrillation with a dual-chamber permanent pacemaker (Medtronic 2008, original 5076 leads, Azure generator change out 2018).  Her pacemaker indeed shows unchanged activity level of roughly 3 hours/day.  Estimated generator longevity 7.6 years.  The burden of atrial fibrillation is 11%, probably exaggerated due to some degree of atrial oversensing.  Clearly there are some episodes of paroxysmal atrial fibrillation but there is also a very low level background of artifact on a daily basis.  Overall the burden of atrial fibrillation appears less than the last 6 months compared to 2019.  Atrial lead sensing and pacing thresholds are mediocre, but  acceptable.  She has roughly 60% atrial pacing and only 20% ventricular pacing.  She feels well and continues to take care of all of her own household and vegetable garden.  She states that she is doing the same work now that she was doing 60 years ago.  She is the main caregiver for her husband and frequently takes care of her son when he has had elective surgeries.  The patient specifically denies any chest pain at rest exertion, dyspnea at rest or with exertion, orthopnea, paroxysmal nocturnal dyspnea, syncope, palpitations, focal neurological deficits, intermittent claudication, lower extremity edema, unexplained weight gain, cough, hemoptysis or wheezing.   She has hypertension. She has a history of mitral valve annuloplasty and Cox Maze cryoablation procedure via minithoracotomy in 2009, has preserved left ventricular systolic function, no residual mitral insufficiency, normal coronary arteries at preoperative angiogram.  The patient does not have symptoms concerning for COVID-19 infection (fever, chills, cough, or new shortness of breath).    Past Medical History:  Diagnosis Date  . A-fib ([place])   . Dyslipidemia   . Hypothyroidism    2 degree amidarone   . Pacemaker   . PAF (paroxysmal atrial fibrillation) ([place])   . S/P mitral valve repair   . SSS (sick sinus syndrome) ([place])   . Systemic hypertension    Past Surgical History:  Procedure Laterality Date  . ABDOMINAL HYSTERECTOMY  1989  . CARDIAC CATHETERIZATION  01/11/2008   mild-mod pulm. hypertension,normal coronaries  . MINIMALLY INVASIVE MAZE PROCEDURE  02/2008  . MITRAL VALVE REPAIR  September 2009   [facility] physio ring annuloplasty  . NM MYOVIEW LTD  03/28/2007  no ischemia  . PACEMAKER PLACEMENT  November 2008  . PPM GENERATOR CHANGEOUT N/A 08/25/2016   Procedure: PPM Generator Changeout - Battery Only;  Surgeon: Sanda Klein, MD;  Location: [facility];  Service: Cardiovascular;  Laterality: N/A;  . US  ECHOCARDIOGRAPHY  03/07/2012   mild LVH,EF =>55%,mild MR,mod TR,trace AI,right atrium mod. dilated     Current Meds  Medication Sig  . levothyroxine (SYNTHROID, LEVOTHROID) 25 MCG tablet TAKE 1 TABLET DAILY  . metoprolol tartrate (LOPRESSOR) 50 MG tablet TAKE 1 TABLET TWICE A DAY (SCHEDULE APPOINTMENT FOR FURTHER REFILLS) (Patient taking differently: Take 50 mg by mouth 2 (two) times daily. )  . Polyethyl Glycol-Propyl Glycol (SYSTANE OP) Apply 1 drop to eye daily as needed (dry eyes).  . Probiotic Product (PROBIOTIC PO) Take 2 tablets by mouth daily.  Marland Kitchen triamterene-hydrochlorothiazide (DYAZIDE) 37.5-25 MG capsule TAKE 1 CAPSULE DAILY ( PLEASE SCHEDULE AN APPOINTMENT FOR FUTURE REFILLS ) (Patient taking differently: Take 1 capsule by mouth daily. )  . warfarin (COUMADIN) 2.5 MG tablet TAKE 5 mg TABLETS ON MONDAy, AND FRIDAY AND 2.5 mg TABLET ALL OTHER DAYS OF THE WEEK.     Allergies:   Statins   Social History   Tobacco Use  . Smoking status: Never Smoker  . Smokeless tobacco: Never Used  Substance Use Topics  . Alcohol use: No  . Drug use: Not on file     Family Hx: The patient's family history is not on file.  ROS:   Please see the history of present illness.    All other systems reviewed and are negative.   Prior CV studies:   The following studies were reviewed today: Comprehensive pacemaker downloads from December, March, June 2020  Labs/Other Tests and Data Reviewed:    EKG:  An ECG dated 05/29/2018 was personally reviewed today and demonstrated:  Atrial fibrillation with mild RVR  Recent Labs: 01/23/2018: ALT 14; TSH 3.390 04/29/2018: BUN 22; Creatinine, Ser 1.30; Hemoglobin 13.3; Magnesium 2.2; Platelets 215; Potassium 3.6; Sodium 138   Recent Lipid Panel Lab Results  Component Value Date/Time   CHOL 223 (H) 05/21/2016 09:54 AM   TRIG 120 05/21/2016 09:54 AM   HDL 52 05/21/2016 09:54 AM   CHOLHDL 4.3 05/21/2016 09:54 AM   CHOLHDL 4.8 12/26/2012 10:57 AM    LDLCALC 147 (H) 05/21/2016 09:54 AM    Wt Readings from Last 3 Encounters:  01/08/19 162 lb (73.5 kg)  01/27/18 166 lb (75.3 kg)  01/05/18 167 lb 9.6 oz (76 kg)     Objective:    Vital Signs:  BP 108/60   Pulse 67   Ht 5\' 8"  (1.727 m)   Wt 162 lb (73.5 kg)   BMI 24.63 kg/m    VITAL SIGNS:  reviewed Unable to examine  ASSESSMENT & PLAN:    1. AFib: Asymptomatic and well rate controlled.  Overall burden appears lower compared to last year.  On appropriate anticoagulation. CHADSVasc 4 (age 7, HTN, gender).  Although she has a history of mitral valve repair she did not have rheumatic valvular heart disease.  2. Warfarin: Monitored by PCP in [place].  She does not want to switch to a direct oral anticoagulant.  3. Tachy-bradycardia sd: Heart rate histogram distribution appears appropriate.  She is remarkably active for her age.  4. PM: Atrial lead parameters are not great, but are not really interfering with device function.  There is some oversensing of the true burden of atrial fibrillation.  She is  not pacemaker dependent. 5. HTN: Excellent control.  Denies symptoms of hypotension.  Has not recorded any systolic blood pressure under 95.  6. Hx MV repair: No symptoms of heart failure.  No murmur at her last exam in person.  Last echocardiogram performed in 2013.  COVID-19 Education: The signs and symptoms of COVID-19 were discussed with the patient and how to seek care for testing (follow up with PCP or arrange E-visit).  The importance of social distancing was discussed today.  Time:   Today, I have spent 16 minutes with the patient with telehealth technology discussing the above problems.     Medication Adjustments/Labs and Tests Ordered: Current medicines are reviewed at length with the patient today.  Concerns regarding medicines are outlined above.   Tests Ordered: No orders of the defined types were placed in this encounter.   Medication Changes: No orders of the  defined types were placed in this encounter.   Follow Up:  Virtual Visit or In Person 12 months  Signed, Sanda Klein, MD  01/08/2019 9:03 AM    [place]

## 2019-01-08 NOTE — Patient Instructions (Signed)

## 2019-01-09 ENCOUNTER — Other Ambulatory Visit: Payer: Self-pay

## 2019-01-10 ENCOUNTER — Encounter: Payer: Self-pay | Admitting: Family Medicine

## 2019-01-10 ENCOUNTER — Ambulatory Visit (INDEPENDENT_AMBULATORY_CARE_PROVIDER_SITE_OTHER): Payer: MEDICARE | Admitting: Family Medicine

## 2019-01-10 ENCOUNTER — Other Ambulatory Visit: Payer: Self-pay

## 2019-01-10 DIAGNOSIS — N183 Chronic kidney disease, stage 3 unspecified: Secondary | ICD-10-CM

## 2019-01-10 DIAGNOSIS — Z7901 Long term (current) use of anticoagulants: Secondary | ICD-10-CM | POA: Diagnosis not present

## 2019-01-10 DIAGNOSIS — E039 Hypothyroidism, unspecified: Secondary | ICD-10-CM | POA: Diagnosis not present

## 2019-01-10 DIAGNOSIS — I10 Essential (primary) hypertension: Secondary | ICD-10-CM | POA: Diagnosis not present

## 2019-01-10 DIAGNOSIS — Z9889 Other specified postprocedural states: Secondary | ICD-10-CM | POA: Diagnosis not present

## 2019-01-10 DIAGNOSIS — I480 Paroxysmal atrial fibrillation: Secondary | ICD-10-CM

## 2019-01-10 LAB — COAGUCHEK XS/INR WAIVED
INR: 1.8 — ABNORMAL HIGH (ref 0.9–1.1)
Prothrombin Time: 21.9 s

## 2019-01-10 NOTE — Progress Notes (Signed)
Telephone visit  Subjective: CC: f/u Coumadin, hypothyroidism PCP: [name], DO [name] is a 85 y.o. female calls for telephone consult today. Patient provides verbal consent for consult held via phone.  Location of patient: home Location of provider: Working remotely from home Others present for call: none  1.  History of mitral valve repair/atrial fibrillation Patient on chronic Coumadin for history of mitral valve repair.  She takes 5 mg of Coumadin every Wednesday and 2.5 mg daily all other days.  Her goal INR is 2-3.  Last visit she was subtherapeutic with INR of 1.7. Denies recent vitamin K rich food, melena, hematochezia, hematemesis, hematuria or vaginal bleeding  2. Hypothyroidism Patient is compliant with her Synthroid daily.  Does not report any unplanned weight loss, heart palpitations, stool changes.  She is about out of her Synthroid and would like a refill.   ROS: Per HPI  Allergies  Allergen Reactions  . Statins Other (See Comments)    Myalgia   Past Medical History:  Diagnosis Date  . A-fib ([place])   . Dyslipidemia   . Hypothyroidism    2 degree amidarone   . Pacemaker   . PAF (paroxysmal atrial fibrillation) ([place])   . S/P mitral valve repair   . SSS (sick sinus syndrome) ([place])   . Systemic hypertension     Current Outpatient Medications:  .  levothyroxine (SYNTHROID, LEVOTHROID) 25 MCG tablet, TAKE 1 TABLET DAILY, Disp: 90 tablet, Rfl: 0 .  metoprolol tartrate (LOPRESSOR) 50 MG tablet, TAKE 1 TABLET TWICE A DAY, Disp: 180 tablet, Rfl: 3 .  Polyethyl Glycol-Propyl Glycol (SYSTANE OP), Apply 1 drop to eye daily as needed (dry eyes)., Disp: , Rfl:  .  Probiotic Product (PROBIOTIC PO), Take 2 tablets by mouth daily., Disp: , Rfl:  .  triamterene-hydrochlorothiazide (DYAZIDE) 37.5-25 MG capsule, TAKE 1 CAPSULE DAILY, Disp: 90 capsule, Rfl: 3 .  warfarin (COUMADIN) 2.5 MG tablet, TAKE 5 mg TABLETS ON MONDAy, AND FRIDAY AND 2.5 mg TABLET ALL  OTHER DAYS OF THE WEEK., Disp: 150 tablet, Rfl: 4  Results for orders placed or performed in visit on 01/10/19 (from the past 24 hour(s))  CoaguChek XS/INR Waived     Status: Abnormal   Collection Time: 01/10/19 10:10 AM  Result Value Ref Range   INR 1.8 (H) 0.9 - 1.1   Prothrombin Time 21.9 sec   Narrative   Performed at:  [address] [place], [place]  [id number] Lab Director: Colletta [place] [facility], Phone:  [phone]   Assessment/ Plan: 85 y.o. female   1. Paroxysmal atrial fibrillation (HCC) Subtherapeutic INR of 1.8 today. Goal INR 2-3.  Increase warfarin to 5 mg every Wednesday and Friday and continue 2.5 mg daily all other days.  She will return in 1 week for INR recheck.  Schedules unfortunately only worked out for her to be seen next Friday and I have placed her with an office visit with Delight Ovens.  I will forward this note to her with my recommendations for INR and Coumadin titration if not at goal. - CoaguChek XS/INR Waived - CBC  2. S/P mitral valve repair (no artificial valve) - CoaguChek XS/INR Waived - CBC  3. Long term current use of anticoagulant - CoaguChek XS/INR Waived - CBC  4. Acquired hypothyroidism We will send refills of Synthroid to [facility] once her TSH has resulted - Thyroid Panel With TSH  5. Essential hypertension Stable - CMP14+EGFR  6. CKD (  chronic kidney disease) stage 3, GFR 30-59 ml/min (HCC) - CMP14+EGFR   Start time: 3:06pm End time: 3:19pm  Total time spent on patient care (including telephone call/ virtual visit): 17 minutes  [place], [place] [phone]

## 2019-01-11 ENCOUNTER — Other Ambulatory Visit: Payer: Self-pay | Admitting: Family Medicine

## 2019-01-11 LAB — THYROID PANEL WITH TSH
Free Thyroxine Index: 1.8 (ref 1.2–4.9)
T3 Uptake Ratio: 24 % (ref 24–39)
T4, Total: 7.3 ug/dL (ref 4.5–12.0)
TSH: 2.09 u[IU]/mL (ref 0.450–4.500)

## 2019-01-11 LAB — CMP14+EGFR
ALT: 10 IU/L (ref 0–32)
AST: 17 IU/L (ref 0–40)
Albumin/Globulin Ratio: 1.8 (ref 1.2–2.2)
Albumin: 4.4 g/dL (ref 3.6–4.6)
Alkaline Phosphatase: 56 IU/L (ref 39–117)
BUN/Creatinine Ratio: 19 (ref 12–28)
BUN: 21 mg/dL (ref 8–27)
Bilirubin Total: 0.7 mg/dL (ref 0.0–1.2)
CO2: 25 mmol/L (ref 20–29)
Calcium: 9.4 mg/dL (ref 8.7–10.3)
Chloride: 100 mmol/L (ref 96–106)
Creatinine, Ser: 1.13 mg/dL — ABNORMAL HIGH (ref 0.57–1.00)
GFR calc Af Amer: 50 mL/min/{1.73_m2} — ABNORMAL LOW (ref >59)
GFR calc non Af Amer: 44 mL/min/{1.73_m2} — ABNORMAL LOW (ref >59)
Globulin, Total: 2.4 g/dL (ref 1.5–4.5)
Glucose: 89 mg/dL (ref 65–99)
Potassium: 3.7 mmol/L (ref 3.5–5.2)
Sodium: 143 mmol/L (ref 134–144)
Total Protein: 6.8 g/dL (ref 6.0–8.5)

## 2019-01-11 LAB — CBC
Hematocrit: 37.4 % (ref 34.0–46.6)
Hemoglobin: 12.6 g/dL (ref 11.1–15.9)
MCH: 29.9 pg (ref 26.6–33.0)
MCHC: 33.7 g/dL (ref 31.5–35.7)
MCV: 89 fL (ref 79–97)
Platelets: 192 10*3/uL (ref 150–450)
RBC: 4.22 x10E6/uL (ref 3.77–5.28)
RDW: 14.3 % (ref 11.7–15.4)
WBC: 5.3 10*3/uL (ref 3.4–10.8)

## 2019-01-17 ENCOUNTER — Ambulatory Visit (INDEPENDENT_AMBULATORY_CARE_PROVIDER_SITE_OTHER): Payer: MEDICARE

## 2019-01-17 ENCOUNTER — Other Ambulatory Visit: Payer: Self-pay

## 2019-01-17 DIAGNOSIS — Z0000 Encounter for general adult medical examination without abnormal findings: Secondary | ICD-10-CM | POA: Diagnosis not present

## 2019-01-17 NOTE — Progress Notes (Signed)
MEDICARE ANNUAL WELLNESS VISIT  01/17/2019  Telephone Visit Disclaimer This Medicare AWV was conducted by telephone due to national recommendations for restrictions regarding the COVID-19 Pandemic (e.g. social distancing).  I verified, using two identifiers, that I am speaking with [name] or their authorized healthcare agent. I discussed the limitations, risks, security, and privacy concerns of performing an evaluation and management service by telephone and the potential availability of an in-person appointment in the future. The patient expressed understanding and agreed to proceed.   Subjective:  [name] is a 85 y.o. female patient of Melissa Norlander, DO who had a Medicare Annual Wellness Visit today via telephone. Emeri is Retired and lives with their spouse. she has 3 children. she reports that she is socially active and does interact with friends/family regularly. she is moderately physically active and enjoys gardening and sewing.  Patient Care Team: Melissa Norlander, DO as PCP - General (Family Medicine) Lorene Dy, MD (Internal Medicine)  Advanced Directives 01/17/2019 04/29/2018 08/25/2016  Does Patient Have a Medical Advance Directive? No No No  Would patient like information on creating a medical advance directive? No - Patient declined - No - Patient declined    Hospital Utilization Over the Past 12 Months: # of hospitalizations or ER visits: 0 # of surgeries: 0  Review of Systems    Patient reports that her overall health is unchanged compared to last year.  Patient Reported Readings (BP, Pulse, CBG, Weight, etc) none  Review of Systems: History obtained from chart review  All other systems negative.  Pain Assessment Pain : No/denies pain     Current Medications & Allergies (verified) Allergies as of 01/17/2019      Reactions   Statins Other (See Comments)   Myalgia      Medication List       Accurate as of January 17, 2019   8:54 AM. If you have any questions, ask your nurse or doctor.        levothyroxine 25 MCG tablet Commonly known as: SYNTHROID TAKE 1 TABLET DAILY   metoprolol tartrate 50 MG tablet Commonly known as: LOPRESSOR TAKE 1 TABLET TWICE A DAY   PROBIOTIC PO Take 2 tablets by mouth daily.   SYSTANE OP Apply 1 drop to eye daily as needed (dry eyes).   triamterene-hydrochlorothiazide 37.5-25 MG capsule Commonly known as: DYAZIDE TAKE 1 CAPSULE DAILY   warfarin 2.5 MG tablet Commonly known as: COUMADIN Take as directed by the anticoagulation clinic. If you are unsure how to take this medication, talk to your nurse or doctor. Original instructions: TAKE 5 mg TABLETS ON MONDAy, AND FRIDAY AND 2.5 mg TABLET ALL OTHER DAYS OF THE WEEK.       History (reviewed): Past Medical History:  Diagnosis Date  . A-fib ([place])   . Dyslipidemia   . Hypothyroidism    2 degree amidarone   . Pacemaker   . PAF (paroxysmal atrial fibrillation) ([place])   . S/P mitral valve repair   . SSS (sick sinus syndrome) ([place])   . Systemic hypertension    Past Surgical History:  Procedure Laterality Date  . ABDOMINAL HYSTERECTOMY  1989  . CARDIAC CATHETERIZATION  01/11/2008   mild-mod pulm. hypertension,normal coronaries  . MINIMALLY INVASIVE MAZE PROCEDURE  02/2008  . MITRAL VALVE REPAIR  September 2009   [facility] ring annuloplasty  . NM MYOVIEW LTD  03/28/2007   no ischemia  . PACEMAKER PLACEMENT  November 2008  .  PPM GENERATOR CHANGEOUT N/A 08/25/2016   Procedure: PPM Generator Changeout - Battery Only;  Surgeon: Sanda Klein, MD;  Location: [facility];  Service: Cardiovascular;  Laterality: N/A;  . US ECHOCARDIOGRAPHY  03/07/2012   mild LVH,EF =>55%,mild MR,mod TR,trace AI,right atrium mod. dilated   Family History  Problem Relation Age of Onset  . Heart attack Mother   . Heart attack Father    Social History   Socioeconomic History  . Marital status: Married    Spouse name: Not on  file  . Number of children: 3  . Years of education: Not on file  . Highest education level: High school graduate  Occupational History  . Occupation: Retired  Scientific laboratory technician  . Financial resource strain: Not hard at all  . Food insecurity    Worry: Never true    Inability: Never true  . Transportation needs    Medical: No    Non-medical: No  Tobacco Use  . Smoking status: Never Smoker  . Smokeless tobacco: Never Used  Substance and Sexual Activity  . Alcohol use: No  . Drug use: Not on file  . Sexual activity: Not Currently  Lifestyle  . Physical activity    Days per week: 0 days    Minutes per session: 0 min  . Stress: Not at all  Relationships  . Social connections    Talks on phone: More than three times a week    Gets together: More than three times a week    Attends religious service: Not on file    Active member of club or organization: Not on file    Attends meetings of clubs or organizations: Not on file    Relationship status: Married  Other Topics Concern  . Not on file  Social History Narrative  . Not on file    Activities of Daily Living In your present state of health, do you have any difficulty performing the following activities: 01/17/2019  Hearing? N  Vision? Y  Comment Wears glasses all the time  Difficulty concentrating or making decisions? N  Walking or climbing stairs? N  Dressing or bathing? N  Doing errands, shopping? N  Preparing Food and eating ? N  Using the Toilet? N  In the past six months, have you accidently leaked urine? N  Do you have problems with loss of bowel control? N  Managing your Medications? N  Managing your Finances? N  Housekeeping or managing your Housekeeping? N  Some recent data might be hidden    Patient Education/ Literacy How often do you need to have someone help you when you read instructions, pamphlets, or other written materials from your doctor or pharmacy?: 1 - Never What is the last grade level you  completed in school?: 12th grade  Exercise Current Exercise Habits: The patient does not participate in regular exercise at present, Exercise limited by: cardiac condition(s)  Diet Patient reports consuming 3 meals a day and 2 snack(s) a day Patient reports that her primary diet is: Regular Patient reports that she does have regular access to food.   Depression Screen PHQ 2/9 Scores 01/27/2018 10/04/2017 05/21/2016 09/30/2015  PHQ - 2 Score 0 0 0 0  PHQ- 9 Score - - - 1     Fall Risk Fall Risk  01/27/2018 10/04/2017 05/21/2016  Falls in the past year? No No No     Objective:  CELESTER MORGAN seemed alert and oriented and she participated appropriately during our telephone  visit.  Blood Pressure Weight BMI  BP Readings from Last 3 Encounters:  01/08/19 108/60  04/29/18 125/60  01/27/18 138/82   Wt Readings from Last 3 Encounters:  01/08/19 162 lb (73.5 kg)  01/27/18 166 lb (75.3 kg)  01/05/18 167 lb 9.6 oz (76 kg)   BMI Readings from Last 1 Encounters:  01/08/19 24.63 kg/m    *Unable to obtain current vital signs, weight, and BMI due to telephone visit type  Hearing/Vision  . Marvel did not seem to have difficulty with hearing/understanding during the telephone conversation . Reports that she has had a formal eye exam by an eye care professional within the past year . Reports that she has not had a formal hearing evaluation within the past year *Unable to fully assess hearing and vision during telephone visit type  Cognitive Function: 6CIT Screen 01/17/2019  What Year? 0 points  What month? 0 points  What time? 0 points  Count back from 20 0 points  Months in reverse 0 points  Repeat phrase 0 points  Total Score 0   (Normal:0-7, Significant for Dysfunction: >8)  Normal Cognitive Function Screening: Yes   Immunization & Health Maintenance Record Immunization History  Administered Date(s) Administered  . Influenza, High Dose Seasonal PF 03/26/2016, 04/01/2017,  03/29/2018  . Influenza,inj,Quad PF,6+ Mos 04/24/2013, 05/01/2014, 04/08/2015    Health Maintenance  Topic Date Due  . TETANUS/TDAP  05/28/1950  . PNA vac Low Risk Adult (1 of 2 - PCV13) 05/28/1996  . INFLUENZA VACCINE  01/27/2019  . DEXA SCAN  Completed       Assessment  This is a routine wellness examination for [employer].  Health Maintenance: Due or Overdue Health Maintenance Due  Topic Date Due  . TETANUS/TDAP  05/28/1950  . PNA vac Low Risk Adult (1 of 2 - PCV13) 05/28/1996    [name] does not need a referral for Community Assistance: Care Management:   no Social Work:    no Prescription Assistance:  no Nutrition/Diabetes Education:  no   Plan:  Personalized Goals Goals Addressed            This Visit's Progress   . Exercise 150 min/wk Moderate Activity      . Have 3 meals a day        Personalized Health Maintenance & Screening Recommendations  Pneumococcal vaccine   Lung Cancer Screening Recommended: no (Low Dose CT Chest recommended if Age 11-80 years, 30 pack-year currently smoking OR have quit w/in past 15 years) Hepatitis C Screening recommended: no HIV Screening recommended: no  Advanced Directives: Written information was not prepared per patient's request.  Referrals & Orders No orders of the defined types were placed in this encounter.   Follow-up Plan . Follow-up with Melissa Norlander, DO as planned Schedule to discuss pneumonia vaccines    I have personally reviewed and noted the following in the patient's chart:   . Medical and social history . Use of alcohol, tobacco or illicit drugs  . Current medications and supplements . Functional ability and status . Nutritional status . Physical activity . Advanced directives . List of other physicians . Hospitalizations, surgeries, and ER visits in previous 12 months . Vitals . Screenings to include cognitive, depression, and falls . Referrals and appointments  In  addition, I have reviewed and discussed with [name] certain preventive protocols, quality metrics, and best practice recommendations. A written personalized care plan for preventive services as well as general  preventive health recommendations is available and can be mailed to the patient at her request.      Rolena Infante LPN 6/43/1427

## 2019-01-18 ENCOUNTER — Other Ambulatory Visit: Payer: Self-pay

## 2019-01-19 ENCOUNTER — Ambulatory Visit (INDEPENDENT_AMBULATORY_CARE_PROVIDER_SITE_OTHER): Payer: MEDICARE | Admitting: Family Medicine

## 2019-01-19 ENCOUNTER — Encounter: Payer: Self-pay | Admitting: Family Medicine

## 2019-01-19 VITALS — BP 128/72 | HR 82 | Temp 97.1°F | Ht 68.0 in | Wt 166.0 lb

## 2019-01-19 DIAGNOSIS — Z7901 Long term (current) use of anticoagulants: Secondary | ICD-10-CM | POA: Diagnosis not present

## 2019-01-19 DIAGNOSIS — I480 Paroxysmal atrial fibrillation: Secondary | ICD-10-CM | POA: Diagnosis not present

## 2019-01-19 NOTE — Progress Notes (Signed)
Assessment & Plan:  1-2. Paroxysmal atrial fibrillation (HCC)/Long term current use of anticoagulant - INR had to be sent out today as the machine is current down. I advised patient to continue current dosing and I will call her this weekend if any changes need to be made.  - Protime-INR   Follow up plan: Return in about 4 weeks (around 02/16/2019) for INR (if at goal).  Hendricks Limes, MSN, APRN, FNP-C [facility]  Subjective:   Patient ID: [name], female    DOB: 1929-01-11, 85 y.o.   MRN: [id number]  HPI: [name] is a 85 y.o. female presenting on 01/19/2019 for Anticoagulation  Patient here for re-check of INR. Her last INR was 1.8 on 01/10/2019; at that time she was increased by 2.5 mg.   ROS: Negative unless specifically indicated above in HPI.   Relevant past medical history reviewed and updated as indicated.   Allergies and medications reviewed and updated.   Current Outpatient Medications:  .  levothyroxine (SYNTHROID) 25 MCG tablet, TAKE 1 TABLET DAILY, Disp: 90 tablet, Rfl: 3 .  metoprolol tartrate (LOPRESSOR) 50 MG tablet, TAKE 1 TABLET TWICE A DAY, Disp: 180 tablet, Rfl: 3 .  Polyethyl Glycol-Propyl Glycol (SYSTANE OP), Apply 1 drop to eye daily as needed (dry eyes)., Disp: , Rfl:  .  Probiotic Product (PROBIOTIC PO), Take 2 tablets by mouth daily., Disp: , Rfl:  .  triamterene-hydrochlorothiazide (DYAZIDE) 37.5-25 MG capsule, TAKE 1 CAPSULE DAILY, Disp: 90 capsule, Rfl: 3 .  warfarin (COUMADIN) 2.5 MG tablet, TAKE 5 mg TABLETS ON MONDAy, AND FRIDAY AND 2.5 mg TABLET ALL OTHER DAYS OF THE WEEK., Disp: 150 tablet, Rfl: 4  Allergies  Allergen Reactions  . Statins Other (See Comments)    Myalgia    Objective:   BP 128/72   Pulse 82   Temp (!) 97.1 F (36.2 C) (Oral)   Ht 5\' 8"  (1.727 m)   Wt 166 lb (75.3 kg)   BMI 25.24 kg/m    Physical Exam Vitals signs reviewed.  Constitutional:      General: She is not in acute  distress.    Appearance: Normal appearance. She is normal weight. She is not ill-appearing, toxic-appearing or diaphoretic.  HENT:     Head: Normocephalic and atraumatic.  Eyes:     General: No scleral icterus.       Right eye: No discharge.        Left eye: No discharge.     Conjunctiva/sclera: Conjunctivae normal.  Neck:     Musculoskeletal: Normal range of motion.  Cardiovascular:     Rate and Rhythm: Normal rate and regular rhythm.     Heart sounds: Normal heart sounds. No murmur. No friction rub. No gallop.   Pulmonary:     Effort: Pulmonary effort is normal. No respiratory distress.     Breath sounds: Normal breath sounds. No stridor. No wheezing, rhonchi or rales.  Musculoskeletal: Normal range of motion.  Skin:    General: Skin is warm and dry.     Capillary Refill: Capillary refill takes less than 2 seconds.  Neurological:     General: No focal deficit present.     Mental Status: She is alert and oriented to person, place, and time. Mental status is at baseline.  Psychiatric:        Mood and Affect: Mood normal.        Behavior: Behavior normal.  Thought Content: Thought content normal.        Judgment: Judgment normal.

## 2019-01-20 LAB — PROTIME-INR
INR: 2.1 — ABNORMAL HIGH (ref 0.8–1.2)
Prothrombin Time: 22.2 s — ABNORMAL HIGH (ref 9.1–12.0)

## 2019-03-06 ENCOUNTER — Ambulatory Visit (INDEPENDENT_AMBULATORY_CARE_PROVIDER_SITE_OTHER): Payer: MEDICARE | Admitting: *Deleted

## 2019-03-06 DIAGNOSIS — I495 Sick sinus syndrome: Secondary | ICD-10-CM | POA: Diagnosis not present

## 2019-03-06 LAB — CUP PACEART REMOTE DEVICE CHECK
Battery Remaining Longevity: 92 mo
Battery Voltage: 2.98 V
Brady Statistic AP VP Percent: 11.91 %
Brady Statistic AP VS Percent: 57.27 %
Brady Statistic AS VP Percent: 5.22 %
Brady Statistic AS VS Percent: 25.58 %
Brady Statistic RA Percent Paced: 64.89 %
Brady Statistic RV Percent Paced: 19.09 %
Date Time Interrogation Session: 20200908053855
Implantable Lead Implant Date: 20081126
Implantable Lead Implant Date: 20081126
Implantable Lead Location: 753859
Implantable Lead Location: 753860
Implantable Lead Model: 5076
Implantable Lead Model: 5076
Implantable Pulse Generator Implant Date: 20180228
Lead Channel Impedance Value: 361 Ohm
Lead Channel Impedance Value: 380 Ohm
Lead Channel Impedance Value: 399 Ohm
Lead Channel Impedance Value: 399 Ohm
Lead Channel Pacing Threshold Amplitude: 1 V
Lead Channel Pacing Threshold Pulse Width: 0.4 ms
Lead Channel Sensing Intrinsic Amplitude: 0.5 mV
Lead Channel Sensing Intrinsic Amplitude: 0.5 mV
Lead Channel Sensing Intrinsic Amplitude: 8.125 mV
Lead Channel Sensing Intrinsic Amplitude: 8.125 mV
Lead Channel Setting Pacing Amplitude: 2.5 V
Lead Channel Setting Pacing Amplitude: 2.75 V
Lead Channel Setting Pacing Pulse Width: 0.4 ms
Lead Channel Setting Sensing Sensitivity: 2 mV

## 2019-03-13 ENCOUNTER — Telehealth: Payer: Self-pay | Admitting: Family Medicine

## 2019-03-13 NOTE — Telephone Encounter (Signed)
appt scheduled Pt notified 

## 2019-03-14 ENCOUNTER — Other Ambulatory Visit: Payer: Self-pay

## 2019-03-14 ENCOUNTER — Encounter: Payer: Self-pay | Admitting: Family Medicine

## 2019-03-14 ENCOUNTER — Ambulatory Visit (INDEPENDENT_AMBULATORY_CARE_PROVIDER_SITE_OTHER): Payer: MEDICARE | Admitting: Family Medicine

## 2019-03-14 VITALS — BP 135/71 | HR 84 | Temp 97.5°F | Resp 18 | Ht 68.0 in | Wt 166.6 lb

## 2019-03-14 DIAGNOSIS — Z23 Encounter for immunization: Secondary | ICD-10-CM

## 2019-03-14 DIAGNOSIS — Z7901 Long term (current) use of anticoagulants: Secondary | ICD-10-CM | POA: Diagnosis not present

## 2019-03-14 DIAGNOSIS — I480 Paroxysmal atrial fibrillation: Secondary | ICD-10-CM | POA: Diagnosis not present

## 2019-03-14 DIAGNOSIS — Z9889 Other specified postprocedural states: Secondary | ICD-10-CM

## 2019-03-14 LAB — COAGUCHEK XS/INR WAIVED
INR: 2.4 — ABNORMAL HIGH (ref 0.9–1.1)
Prothrombin Time: 29 s

## 2019-03-14 NOTE — Progress Notes (Signed)
Subjective: CC: INR check PCP: [name], DO [id number] [name] is a 85 y.o. female presenting to clinic today for:  1. Afib/ MV Repair Cardiologist is Dr. Sallyanne Kuster.  Has pacemaker in place.  Goal INR 2-3. Patient is that she has been doing well since our last visit.  Denies any abnormal bleeding including hematochezia, melena, vaginal bleeding or hematuria.  She has been compliant with the warfarin taking 5 mg every Wednesday and Saturday and 2.5 mg daily all other days.   ROS: Per HPI  Allergies  Allergen Reactions  . Statins Other (See Comments)    Myalgia   Past Medical History:  Diagnosis Date  . A-fib ([place])   . Dyslipidemia   . Hypothyroidism    2 degree amidarone   . Pacemaker   . PAF (paroxysmal atrial fibrillation) ([place])   . S/P mitral valve repair   . SSS (sick sinus syndrome) ([place])   . Systemic hypertension     Current Outpatient Medications:  .  levothyroxine (SYNTHROID) 25 MCG tablet, TAKE 1 TABLET DAILY, Disp: 90 tablet, Rfl: 3 .  metoprolol tartrate (LOPRESSOR) 50 MG tablet, TAKE 1 TABLET TWICE A DAY, Disp: 180 tablet, Rfl: 3 .  Polyethyl Glycol-Propyl Glycol (SYSTANE OP), Apply 1 drop to eye daily as needed (dry eyes)., Disp: , Rfl:  .  Probiotic Product (PROBIOTIC PO), Take 2 tablets by mouth daily., Disp: , Rfl:  .  triamterene-hydrochlorothiazide (DYAZIDE) 37.5-25 MG capsule, TAKE 1 CAPSULE DAILY, Disp: 90 capsule, Rfl: 3 .  warfarin (COUMADIN) 2.5 MG tablet, TAKE 5 mg TABLETS ON MONDAy, AND FRIDAY AND 2.5 mg TABLET ALL OTHER DAYS OF THE WEEK., Disp: 150 tablet, Rfl: 4 Social History   Socioeconomic History  . Marital status: Married    Spouse name: Not on file  . Number of children: 3  . Years of education: Not on file  . Highest education level: High school graduate  Occupational History  . Occupation: Retired  Scientific laboratory technician  . Financial resource strain: Not hard at all  . Food insecurity    Worry: Never true    Inability: Never  true  . Transportation needs    Medical: No    Non-medical: No  Tobacco Use  . Smoking status: Never Smoker  . Smokeless tobacco: Never Used  Substance and Sexual Activity  . Alcohol use: No  . Drug use: Not on file  . Sexual activity: Not Currently  Lifestyle  . Physical activity    Days per week: 0 days    Minutes per session: 0 min  . Stress: Not at all  Relationships  . Social connections    Talks on phone: More than three times a week    Gets together: More than three times a week    Attends religious service: Not on file    Active member of club or organization: Not on file    Attends meetings of clubs or organizations: Not on file    Relationship status: Married  . Intimate partner violence    Fear of current or ex partner: No    Emotionally abused: No    Physically abused: No    Forced sexual activity: No  Other Topics Concern  . Not on file  Social History Narrative  . Not on file   Family History  Problem Relation Age of Onset  . Heart attack Mother   . Heart attack Father     Objective: Office vital signs reviewed. BP 135/71  Pulse 84   Temp (!) 97.5 F (36.4 C) (Temporal)   Resp 18   Ht 5\' 8"  (1.727 m)   Wt 166 lb 9.6 oz (75.6 kg)   SpO2 99%   BMI 25.33 kg/m   Physical Examination:  General: Awake, alert, well nourished, No acute distress HEENT: Normal, sclera white, MMM Cardio: regular rate and rhythm, S1S2 heard, no murmurs appreciated Pulm: clear to auscultation bilaterally, no wheezes, rhonchi or rales; normal work of breathing on room air  Assessment/ Plan: 85 y.o. female   1. Paroxysmal atrial fibrillation (HCC) Rate controlled and seemingly in sinus rhythm today.  INR is therapeutic at 2.4.  Continue current regimen of 5 mg Wednesday and Fridays; 2.5 mg daily all other days.  Recheck in 6 weeks, sooner if needed. - CoaguChek XS/INR Waived  2. S/P mitral valve repair - CoaguChek XS/INR Waived  3. Long term current use of  anticoagulant - CoaguChek XS/INR Waived  4. Need for immunization against influenza Administered. - Flu Vaccine QUAD High Dose(Fluad)   No orders of the defined types were placed in this encounter.  No orders of the defined types were placed in this encounter.    [name], DO [place] [phone]

## 2019-03-21 ENCOUNTER — Encounter: Payer: Self-pay | Admitting: Cardiology

## 2019-03-21 NOTE — Progress Notes (Signed)
Remote pacemaker transmission.   

## 2019-04-25 ENCOUNTER — Telehealth: Payer: Self-pay | Admitting: Family Medicine

## 2019-04-26 NOTE — Telephone Encounter (Signed)
Appointment rescheduled with Dr. Lajuana Ripple on 05/02/19 at 4:15 pm.

## 2019-04-27 ENCOUNTER — Ambulatory Visit: Payer: MEDICARE | Admitting: Family Medicine

## 2019-05-02 ENCOUNTER — Ambulatory Visit (INDEPENDENT_AMBULATORY_CARE_PROVIDER_SITE_OTHER): Payer: MEDICARE | Admitting: Family Medicine

## 2019-05-02 ENCOUNTER — Encounter: Payer: Self-pay | Admitting: Family Medicine

## 2019-05-02 DIAGNOSIS — I480 Paroxysmal atrial fibrillation: Secondary | ICD-10-CM | POA: Diagnosis not present

## 2019-05-02 LAB — POCT INR: INR: 2.2 (ref 2–3)

## 2019-05-02 LAB — COAGUCHEK XS/INR WAIVED
INR: 2.2 — ABNORMAL HIGH (ref 0.9–1.1)
Prothrombin Time: 25.9 s

## 2019-05-02 NOTE — Progress Notes (Signed)
Subjective: CC: INR check PCP: [name], DO [id number] H Lader is a 85 y.o. female presenting to clinic today for:  1. Afib/ MV Repair Cardiologist is Dr. Sallyanne Kuster.  Has pacemaker in place.  Goal INR 2-3.  Patient is that she has been doing well since our last visit.  She has been compliant with the warfarin taking 5 mg every Wednesday and Saturday and 2.5 mg daily all other days.  Denies any hematochezia, melena, vaginal bleeding or hematuria.  No heart palpitations, shortness of breath, change in exercise tolerance.  ROS: Per HPI  Allergies  Allergen Reactions  . Statins Other (See Comments)    Myalgia   Past Medical History:  Diagnosis Date  . A-fib ([place])   . Dyslipidemia   . Hypothyroidism    2 degree amidarone   . Pacemaker   . PAF (paroxysmal atrial fibrillation) ([place])   . S/P mitral valve repair   . SSS (sick sinus syndrome) ([place])   . Systemic hypertension     Current Outpatient Medications:  .  levothyroxine (SYNTHROID) 25 MCG tablet, TAKE 1 TABLET DAILY, Disp: 90 tablet, Rfl: 3 .  metoprolol tartrate (LOPRESSOR) 50 MG tablet, TAKE 1 TABLET TWICE A DAY, Disp: 180 tablet, Rfl: 3 .  Polyethyl Glycol-Propyl Glycol (SYSTANE OP), Apply 1 drop to eye daily as needed (dry eyes)., Disp: , Rfl:  .  Probiotic Product (PROBIOTIC PO), Take 2 tablets by mouth daily., Disp: , Rfl:  .  triamterene-hydrochlorothiazide (DYAZIDE) 37.5-25 MG capsule, TAKE 1 CAPSULE DAILY, Disp: 90 capsule, Rfl: 3 .  warfarin (COUMADIN) 2.5 MG tablet, TAKE 5 mg TABLETS ON MONDAy, AND FRIDAY AND 2.5 mg TABLET ALL OTHER DAYS OF THE WEEK., Disp: 150 tablet, Rfl: 4 Social History   Socioeconomic History  . Marital status: Married    Spouse name: Not on file  . Number of children: 3  . Years of education: Not on file  . Highest education level: High school graduate  Occupational History  . Occupation: Retired  Scientific laboratory technician  . Financial resource strain: Not hard at all  . Food  insecurity    Worry: Never true    Inability: Never true  . Transportation needs    Medical: No    Non-medical: No  Tobacco Use  . Smoking status: Never Smoker  . Smokeless tobacco: Never Used  Substance and Sexual Activity  . Alcohol use: No  . Drug use: Not on file  . Sexual activity: Not Currently  Lifestyle  . Physical activity    Days per week: 0 days    Minutes per session: 0 min  . Stress: Not at all  Relationships  . Social connections    Talks on phone: More than three times a week    Gets together: More than three times a week    Attends religious service: Not on file    Active member of club or organization: Not on file    Attends meetings of clubs or organizations: Not on file    Relationship status: Married  . Intimate partner violence    Fear of current or ex partner: No    Emotionally abused: No    Physically abused: No    Forced sexual activity: No  Other Topics Concern  . Not on file  Social History Narrative  . Not on file   Family History  Problem Relation Age of Onset  . Heart attack Mother   . Heart attack Father  Objective: Office vital signs reviewed. BP 138/74   Pulse 78   Temp (!) 97.5 F (36.4 C) (Oral)   Ht 5\' 8"  (1.727 m)   Wt 166 lb (75.3 kg)   BMI 25.24 kg/m   Physical Examination:  General: Awake, alert, well nourished, No acute distress HEENT: Normal, sclera white, MMM Cardio: irregularly irregular with rate control, S1S2 heard, no murmurs appreciated Pulm: clear to auscultation bilaterally, no wheezes, rhonchi or rales; normal work of breathing on room air  Assessment/ Plan: 85 y.o. female   1. Paroxysmal atrial fibrillation (HCC) Rate controlled. INR at goal at 2.2 today.  Continue current regimen. Follow up in 6 weeks. - CoaguChek XS/INR Waived - POCT INR    No orders of the defined types were placed in this encounter.  No orders of the defined types were placed in this encounter.    [name],  DO [place] [phone]

## 2019-05-30 ENCOUNTER — Telehealth: Payer: Self-pay | Admitting: Family Medicine

## 2019-05-30 NOTE — Chronic Care Management (AMB) (Signed)
°  Chronic Care Management   Outreach Note  05/30/2019 Name: [name] MRN: [id number] DOB: [birth year]  Referred by: [name], DO Reason for referral : Chronic Care Management (Initial CCM outreach )   An unsuccessful telephone outreach was attempted today. The patient was referred to the case management team by for assistance with care management and care coordination.   Follow Up Plan: A HIPPA compliant phone message was left for the patient providing contact information and requesting a return call.  The care management team will reach out to the patient again over the next 7 days.  If patient returns call to provider office, please advise to call [place] [facility] at [place]  [place], [place] [address] Direct Dial: [email or web address]  Website: [email or web address]

## 2019-05-31 NOTE — Chronic Care Management (AMB) (Signed)
Chronic Care Management   Note  05/31/2019 Name: [name] MRN: [id number] DOB: 30-Dec-1928  [name] is a 85 y.o. year old female who is a primary care patient of [name], DO. I reached out to [employer] by phone today in response to a referral sent by [name]'s health plan.     [name] was given information about Chronic Care Management services today including:  1. CCM service includes personalized support from designated clinical staff supervised by her physician, including individualized plan of care and coordination with other care providers 2. 24/7 contact phone numbers for assistance for urgent and routine care needs. 3. Service will only be billed when office clinical staff spend 20 minutes or more in a month to coordinate care. 4. Only one practitioner may furnish and bill the service in a calendar month. 5. The patient may stop CCM services at any time (effective at the end of the month) by phone call to the office staff. 6. The patient will be responsible for cost sharing (co-pay) of up to 20% of the service fee (after annual deductible is met).  Patient agreed to services and verbal consent obtained.   Follow up plan: Telephone appointment with CCM team member scheduled for: 06/26/2019  [place], [facility]  [place], [place] [address] Direct Dial: [email or web address]  Website: [email or web address]

## 2019-05-31 NOTE — Chronic Care Management (AMB) (Signed)
°  Chronic Care Management   Outreach Note  05/31/2019 Name: [name] MRN: [id number] DOB: 1928-08-11  Referred by: [name], DO Reason for referral : Chronic Care Management (Initial CCM outreach ) and Chronic Care Management (Second CCM outreach)   A second unsuccessful telephone outreach was attempted today. The patient was referred to the case management team for assistance with care management and care coordination.   Follow Up Plan: The care management team will reach out to the patient again over the next 7 days.   [place], [place] [address] Direct Dial: [email or web address]  Website: [email or web address]

## 2019-06-05 ENCOUNTER — Ambulatory Visit (INDEPENDENT_AMBULATORY_CARE_PROVIDER_SITE_OTHER): Payer: MEDICARE | Admitting: *Deleted

## 2019-06-05 DIAGNOSIS — Z950 Presence of cardiac pacemaker: Secondary | ICD-10-CM | POA: Diagnosis not present

## 2019-06-06 LAB — CUP PACEART REMOTE DEVICE CHECK
Battery Remaining Longevity: 85 mo
Battery Voltage: 2.98 V
Brady Statistic AP VP Percent: 10.96 %
Brady Statistic AP VS Percent: 53.49 %
Brady Statistic AS VP Percent: 4.94 %
Brady Statistic AS VS Percent: 30.59 %
Brady Statistic RA Percent Paced: 60.72 %
Brady Statistic RV Percent Paced: 17.64 %
Date Time Interrogation Session: 20201209084146
Implantable Lead Implant Date: 20081126
Implantable Lead Implant Date: 20081126
Implantable Lead Location: 753859
Implantable Lead Location: 753860
Implantable Lead Model: 5076
Implantable Lead Model: 5076
Implantable Pulse Generator Implant Date: 20180228
Lead Channel Impedance Value: 361 Ohm
Lead Channel Impedance Value: 361 Ohm
Lead Channel Impedance Value: 380 Ohm
Lead Channel Impedance Value: 399 Ohm
Lead Channel Pacing Threshold Amplitude: 1.125 V
Lead Channel Pacing Threshold Pulse Width: 0.4 ms
Lead Channel Sensing Intrinsic Amplitude: 0.375 mV
Lead Channel Sensing Intrinsic Amplitude: 0.375 mV
Lead Channel Sensing Intrinsic Amplitude: 8.875 mV
Lead Channel Sensing Intrinsic Amplitude: 8.875 mV
Lead Channel Setting Pacing Amplitude: 2.75 V
Lead Channel Setting Pacing Amplitude: 2.75 V
Lead Channel Setting Pacing Pulse Width: 0.4 ms
Lead Channel Setting Sensing Sensitivity: 2 mV

## 2019-06-12 ENCOUNTER — Other Ambulatory Visit: Payer: Self-pay

## 2019-06-13 ENCOUNTER — Ambulatory Visit (INDEPENDENT_AMBULATORY_CARE_PROVIDER_SITE_OTHER): Payer: MEDICARE | Admitting: Family Medicine

## 2019-06-13 ENCOUNTER — Encounter: Payer: Self-pay | Admitting: Family Medicine

## 2019-06-13 VITALS — BP 118/69 | HR 73 | Temp 97.3°F | Ht 68.0 in | Wt 165.0 lb

## 2019-06-13 DIAGNOSIS — N1832 Chronic kidney disease, stage 3b: Secondary | ICD-10-CM

## 2019-06-13 DIAGNOSIS — Z7901 Long term (current) use of anticoagulants: Secondary | ICD-10-CM

## 2019-06-13 DIAGNOSIS — E039 Hypothyroidism, unspecified: Secondary | ICD-10-CM | POA: Diagnosis not present

## 2019-06-13 DIAGNOSIS — Z9889 Other specified postprocedural states: Secondary | ICD-10-CM

## 2019-06-13 DIAGNOSIS — I480 Paroxysmal atrial fibrillation: Secondary | ICD-10-CM

## 2019-06-13 LAB — COAGUCHEK XS/INR WAIVED
INR: 2.3 — ABNORMAL HIGH (ref 0.9–1.1)
Prothrombin Time: 27.3 s

## 2019-06-13 NOTE — Progress Notes (Signed)
Subjective: CC: INR check PCP: [name], DO [id number] [name] is a 85 y.o. female presenting to clinic today for:  1. Afib/ MV Repair Cardiologist is Dr. Sallyanne Kuster.  Has pacemaker in place.  Goal INR 2-3.  Patient was last seen 6 weeks ago. Her INR was therapeutic at 2.2.  She has been compliant with the warfarin taking 5 mg every Wednesday and Saturday and 2.5 mg daily all other days.  She denies any hematochezia, melena, hematuria, heart palpitations, shortness of breath.  2. Hypothyroidism Compliant with Synthroid 25 mcg daily.  Again no heart palpitations, unplanned weight loss.  No tremor  3. Hypertension w/ CKD3 Compliant with Lopressor 50 mg twice daily and Dyazide 37.5-25 mg daily.  No chest pain, shortness of breath.  She has chronic lower extremity edema that is unchanged.  ROS: Per HPI  Allergies  Allergen Reactions  . Statins Other (See Comments)    Myalgia   Past Medical History:  Diagnosis Date  . A-fib ([place])   . Dyslipidemia   . Hypothyroidism    2 degree amidarone   . Pacemaker   . PAF (paroxysmal atrial fibrillation) ([place])   . S/P mitral valve repair   . SSS (sick sinus syndrome) ([place])   . Systemic hypertension     Current Outpatient Medications:  .  levothyroxine (SYNTHROID) 25 MCG tablet, TAKE 1 TABLET DAILY, Disp: 90 tablet, Rfl: 3 .  metoprolol tartrate (LOPRESSOR) 50 MG tablet, TAKE 1 TABLET TWICE A DAY, Disp: 180 tablet, Rfl: 3 .  Polyethyl Glycol-Propyl Glycol (SYSTANE OP), Apply 1 drop to eye daily as needed (dry eyes)., Disp: , Rfl:  .  Probiotic Product (PROBIOTIC PO), Take 2 tablets by mouth daily., Disp: , Rfl:  .  triamterene-hydrochlorothiazide (DYAZIDE) 37.5-25 MG capsule, TAKE 1 CAPSULE DAILY, Disp: 90 capsule, Rfl: 3 .  warfarin (COUMADIN) 2.5 MG tablet, TAKE 5 mg TABLETS ON MONDAy, AND FRIDAY AND 2.5 mg TABLET ALL OTHER DAYS OF THE WEEK., Disp: 150 tablet, Rfl: 4 Social History   Socioeconomic History  . Marital  status: Married    Spouse name: Not on file  . Number of children: 3  . Years of education: Not on file  . Highest education level: High school graduate  Occupational History  . Occupation: Retired  Tobacco Use  . Smoking status: Never Smoker  . Smokeless tobacco: Never Used  Substance and Sexual Activity  . Alcohol use: No  . Drug use: Not on file  . Sexual activity: Not Currently  Other Topics Concern  . Not on file  Social History Narrative  . Not on file   Social Determinants of Health   Financial Resource Strain: Low Risk   . Difficulty of Paying Living Expenses: Not hard at all  Food Insecurity: No Food Insecurity  . Worried About Charity fundraiser in the Last Year: Never true  . Ran Out of Food in the Last Year: Never true  Transportation Needs: No Transportation Needs  . Lack of Transportation (Medical): No  . Lack of Transportation (Non-Medical): No  Physical Activity: Inactive  . Days of Exercise per Week: 0 days  . Minutes of Exercise per Session: 0 min  Stress: No Stress Concern Present  . Feeling of Stress : Not at all  Social Connections: Unknown  . Frequency of Communication with Friends and Family: More than three times a week  . Frequency of Social Gatherings with Friends and Family: More than three times a week  .  Attends Religious Services: Not on file  . Active Member of Clubs or Organizations: Not on file  . Attends Archivist Meetings: Not on file  . Marital Status: Married  Human resources officer Violence: Not At Risk  . Fear of Current or Ex-Partner: No  . Emotionally Abused: No  . Physically Abused: No  . Sexually Abused: No   Family History  Problem Relation Age of Onset  . Heart attack Mother   . Heart attack Father     Objective: Office vital signs reviewed. BP 118/69   Pulse 73   Temp (!) 97.3 F (36.3 C) (Temporal)   Ht 5\' 8"  (1.727 m)   Wt 165 lb (74.8 kg)   SpO2 97%   BMI 25.09 kg/m   Physical Examination:   General: Awake, alert, well nourished, No acute distress HEENT: Normal, sclera white, MMM; no goiter.  No exophthalmos Cardio: Seems to be in sinus rhythm today, S1S2 heard, no murmurs appreciated Pulm: clear to auscultation bilaterally, no wheezes, rhonchi or rales; normal work of breathing on room air Extremities: 1+ pitting edema to mid shins MSK: Valgus deformity of the knees noted.  Ambulates with cane  Assessment/ Plan: 85 y.o. female   1. Paroxysmal atrial fibrillation (HCC) INR therapeutic at 2.3 today.  Continue current regimen.  Follow-up in 6 weeks, sooner if needed - inr FINGERSTICK - CoaguChek XS/INR Waived  2. S/P mitral valve repair  inr FINGERSTICK  3. Long term current use of anticoagulant - CBC  4. Stage 3b chronic kidney disease Check BMP - Basic Metabolic Panel  5. Acquired hypothyroidism Asymptomatic - TSH   Orders Placed This Encounter  Procedures  . Basic Metabolic Panel  . CBC  . inr FINGERSTICK  . TSH   No orders of the defined types were placed in this encounter.    [name], DO [place] [phone]

## 2019-06-14 LAB — CBC
Hematocrit: 37.7 % (ref 34.0–46.6)
Hemoglobin: 12.7 g/dL (ref 11.1–15.9)
MCH: 29.7 pg (ref 26.6–33.0)
MCHC: 33.7 g/dL (ref 31.5–35.7)
MCV: 88 fL (ref 79–97)
Platelets: 191 10*3/uL (ref 150–450)
RBC: 4.27 x10E6/uL (ref 3.77–5.28)
RDW: 13.7 % (ref 11.7–15.4)
WBC: 4.6 10*3/uL (ref 3.4–10.8)

## 2019-06-14 LAB — BASIC METABOLIC PANEL
BUN/Creatinine Ratio: 18 (ref 12–28)
BUN: 22 mg/dL (ref 8–27)
CO2: 27 mmol/L (ref 20–29)
Calcium: 9.7 mg/dL (ref 8.7–10.3)
Chloride: 102 mmol/L (ref 96–106)
Creatinine, Ser: 1.25 mg/dL — ABNORMAL HIGH (ref 0.57–1.00)
GFR calc Af Amer: 44 mL/min/{1.73_m2} — ABNORMAL LOW (ref >59)
GFR calc non Af Amer: 38 mL/min/{1.73_m2} — ABNORMAL LOW (ref >59)
Glucose: 98 mg/dL (ref 65–99)
Potassium: 3.9 mmol/L (ref 3.5–5.2)
Sodium: 142 mmol/L (ref 134–144)

## 2019-06-14 LAB — TSH: TSH: 1.31 u[IU]/mL (ref 0.450–4.500)

## 2019-06-26 ENCOUNTER — Ambulatory Visit (INDEPENDENT_AMBULATORY_CARE_PROVIDER_SITE_OTHER): Payer: MEDICARE | Admitting: *Deleted

## 2019-06-26 DIAGNOSIS — I480 Paroxysmal atrial fibrillation: Secondary | ICD-10-CM

## 2019-06-26 DIAGNOSIS — I10 Essential (primary) hypertension: Secondary | ICD-10-CM

## 2019-06-26 NOTE — Chronic Care Management (AMB) (Signed)
Chronic Care Management   Initial Visit Note  06/26/2019 Name: [name] MRN: [id number] DOB: [birth year]  Referred by: [name], DO Reason for referral : Chronic Care Management (Initial RN outreach)   [name] is a 85 y.o. year old female who is a primary care patient of [name], DO. The CCM team was consulted for assistance with chronic disease management and care coordination needs related to Afib, HTN, CKD, hypothyroidism.  Review of patient status, including review of consultants reports, relevant laboratory and other test results, and collaboration with appropriate care team members and the patient's provider was performed as part of comprehensive patient evaluation and provision of chronic care management services.    SDOH (Social Determinants of Health) screening performed today: Physical Activity. See Care Plan for related entries.   Subjective: I spoke with [name] by telephone today. She expressed that she does not have any care needs at this time but would like to be followed by the CCM team and work with them if the need arises.   Objective: Outpatient Encounter Medications as of 06/26/2019  Medication Sig  . levothyroxine (SYNTHROID) 25 MCG tablet TAKE 1 TABLET DAILY  . metoprolol tartrate (LOPRESSOR) 50 MG tablet TAKE 1 TABLET TWICE A DAY  . Polyethyl Glycol-Propyl Glycol (SYSTANE OP) Apply 1 drop to eye daily as needed (dry eyes).  . Probiotic Product (PROBIOTIC PO) Take 2 tablets by mouth daily.  Marland Kitchen triamterene-hydrochlorothiazide (DYAZIDE) 37.5-25 MG capsule TAKE 1 CAPSULE DAILY  . warfarin (COUMADIN) 2.5 MG tablet TAKE 5 mg TABLETS ON MONDAy, AND FRIDAY AND 2.5 mg TABLET ALL OTHER DAYS OF THE WEEK.   No facility-administered encounter medications on file as of 06/26/2019.   ED/Hospitalizations No ED visits or hospitalizations in 2020. Last ED visit was for palpitations on 04/29/2018.   BP Readings from Last 3 Encounters:    06/13/19 118/69  05/02/19 138/74  03/14/19 135/71   Lab Results  Component Value Date   INR 2.3 (H) 06/13/2019   INR 2.2 (H) 05/02/2019   INR 2.2 05/02/2019  Goal is 2-3  Lab Results  Component Value Date   CREATININE 1.25 (H) 06/13/2019   BUN 22 06/13/2019   NA 142 06/13/2019   K 3.9 06/13/2019   CL 102 06/13/2019   CO2 27 06/13/2019    RN Assessment & Care Plan   . Chronic Disease Management Needs        Current Barriers:  . Chronic Disease Management support, education, and care coordination needs related to Afib, HTN , CKD, hypothyroidism  Clinical Goal(s) related to Afib, HTN , CKD, hypothyroidism:  Over the next 60 days, patient will:  . Work with the care management team to address educational, disease management, and care coordination needs  . Call provider office for new or worsened signs and symptoms  . Call care management team with questions or concerns . Verbalize basic understanding of patient centered plan of care established today  Interventions related to Afib, HTN , CKD, hypothyroidism:  . Evaluation of current treatment plans and patient's adherence to plan as established by provider . Assessed patient understanding of disease states . Assessed patient's education and care coordination needs . Provided disease specific education to patient  . Collaborated with appropriate clinical care team members regarding patient needs  Patient Self Care Activities related to Afib, HTN , CKD, hypothyroidism:  . Patient is unable to independently self-manage chronic health conditions  Initial goal documentation  Follow-up Plan:  The care management team will reach out to the patient again over the next 60 days.   Chong Sicilian, BSN, RN-BC Embedded Chronic Care Manager [facility] [place] [facility] Direct Dial: [phone]

## 2019-06-26 NOTE — Patient Instructions (Signed)
Visit Information  Goals Addressed            This Visit's Progress   . Chronic Disease Management Needs        Current Barriers:  . Chronic Disease Management support, education, and care coordination needs related to Afib, HTN , CKD, hypothyroidism  Clinical Goal(s) related to Afib, HTN , CKD, hypothyroidism:  Over the next 60 days, patient will:  . Work with the care management team to address educational, disease management, and care coordination needs  . Call provider office for new or worsened signs and symptoms  . Call care management team with questions or concerns . Verbalize basic understanding of patient centered plan of care established today  Interventions related to Afib, HTN , CKD, hypothyroidism:  . Evaluation of current treatment plans and patient's adherence to plan as established by provider . Assessed patient understanding of disease states . Assessed patient's education and care coordination needs . Provided disease specific education to patient  . Collaborated with appropriate clinical care team members regarding patient needs  Patient Self Care Activities related to Afib, HTN , CKD, hypothyroidism:  . Patient is unable to independently self-manage chronic health conditions  Initial goal documentation          The care management team will reach out to the patient again over the next 60 days.    Ms. Stailey was given information about Chronic Care Management services today including:  1. CCM service includes personalized support from designated clinical staff supervised by her physician, including individualized plan of care and coordination with other care providers 2. 24/7 contact phone numbers for assistance for urgent and routine care needs. 3. Service will only be billed when office clinical staff spend 20 minutes or more in a month to coordinate care. 4. Only one practitioner may furnish and bill the service in a calendar month. 5. The patient  may stop CCM services at any time (effective at the end of the month) by phone call to the office staff. 6. The patient will be responsible for cost sharing (co-pay) of up to 20% of the service fee (after annual deductible is met).  Patient agreed to services and verbal consent obtained.   Kristen Hudy, BSN, RN-BC Embedded Chronic Care Manager [facility] Direct Dial: [phone]   The patient verbalized understanding of instructions provided today and declined a print copy of patient instruction materials.     

## 2019-07-22 ENCOUNTER — Other Ambulatory Visit: Payer: Self-pay | Admitting: Family Medicine

## 2019-07-30 ENCOUNTER — Other Ambulatory Visit: Payer: Self-pay

## 2019-07-31 ENCOUNTER — Encounter: Payer: Self-pay | Admitting: Family Medicine

## 2019-07-31 ENCOUNTER — Ambulatory Visit (INDEPENDENT_AMBULATORY_CARE_PROVIDER_SITE_OTHER): Payer: MEDICARE | Admitting: Family Medicine

## 2019-07-31 VITALS — BP 123/65 | HR 63 | Temp 96.9°F | Ht 68.0 in | Wt 163.0 lb

## 2019-07-31 DIAGNOSIS — Z7901 Long term (current) use of anticoagulants: Secondary | ICD-10-CM | POA: Diagnosis not present

## 2019-07-31 DIAGNOSIS — Z9889 Other specified postprocedural states: Secondary | ICD-10-CM

## 2019-07-31 DIAGNOSIS — I480 Paroxysmal atrial fibrillation: Secondary | ICD-10-CM

## 2019-07-31 LAB — POCT INR: INR: 2.4 (ref 2–3)

## 2019-07-31 LAB — COAGUCHEK XS/INR WAIVED
INR: 2.4 — ABNORMAL HIGH (ref 0.9–1.1)
Prothrombin Time: 28.6 s

## 2019-07-31 NOTE — Progress Notes (Signed)
Subjective: CC: INR check PCP: Melissa Norlander, DO [id number] [name] is a 86 y.o. female presenting to clinic today for:  1. Afib/ MV Repair Cardiologist is Dr. Sallyanne Kuster.  Has pacemaker in place.  Goal INR 2-3.  Patient was last seen 6 weeks ago. Her INR was therapeutic at 2.3.  She has been compliant with the warfarin taking 5 mg every Wednesday and Saturday and 2.5 mg daily all other days.  She denies any hematochezia, melena, hematuria, heart palpitations, shortness of breath. No falls.  She wants to talk more about the COVID vaccine today.   ROS: Per HPI  Allergies  Allergen Reactions  . Statins Other (See Comments)    Myalgia   Past Medical History:  Diagnosis Date  . A-fib ([place])   . Dyslipidemia   . Hypothyroidism    2 degree amidarone   . Pacemaker   . PAF (paroxysmal atrial fibrillation) ([place])   . S/P mitral valve repair   . SSS (sick sinus syndrome) ([place])   . Systemic hypertension     Current Outpatient Medications:  .  levothyroxine (SYNTHROID) 25 MCG tablet, TAKE 1 TABLET DAILY, Disp: 90 tablet, Rfl: 3 .  metoprolol tartrate (LOPRESSOR) 50 MG tablet, TAKE 1 TABLET TWICE A DAY, Disp: 180 tablet, Rfl: 3 .  Polyethyl Glycol-Propyl Glycol (SYSTANE OP), Apply 1 drop to eye daily as needed (dry eyes)., Disp: , Rfl:  .  Probiotic Product (PROBIOTIC PO), Take 2 tablets by mouth daily., Disp: , Rfl:  .  triamterene-hydrochlorothiazide (DYAZIDE) 37.5-25 MG capsule, TAKE 1 CAPSULE DAILY, Disp: 90 capsule, Rfl: 3 .  warfarin (COUMADIN) 2.5 MG tablet, TAKE 2 TABLETS (5 MG) ON MONDAY AND FRIDAY, THEN TAKE 1 TABLET ALL OTHER DAYS OF THE WEEK, Disp: 150 tablet, Rfl: 1 Social History   Socioeconomic History  . Marital status: Married    Spouse name: Not on file  . Number of children: 3  . Years of education: Not on file  . Highest education level: High school graduate  Occupational History  . Occupation: Retired  Tobacco Use  . Smoking status: Never Smoker  .  Smokeless tobacco: Never Used  Substance and Sexual Activity  . Alcohol use: No  . Drug use: Never  . Sexual activity: Not Currently  Other Topics Concern  . Not on file  Social History Narrative  . Not on file   Social Determinants of Health   Financial Resource Strain: Low Risk   . Difficulty of Paying Living Expenses: Not hard at all  Food Insecurity:   . Worried About Charity fundraiser in the Last Year: Not on file  . Ran Out of Food in the Last Year: Not on file  Transportation Needs:   . Lack of Transportation (Medical): Not on file  . Lack of Transportation (Non-Medical): Not on file  Physical Activity:   . Days of Exercise per Week: Not on file  . Minutes of Exercise per Session: Not on file  Stress:   . Feeling of Stress : Not on file  Social Connections: Somewhat Isolated  . Frequency of Communication with Friends and Family: More than three times a week  . Frequency of Social Gatherings with Friends and Family: More than three times a week  . Attends Religious Services: Never  . Active Member of Clubs or Organizations: No  . Attends Archivist Meetings: Never  . Marital Status: Married  Human resources officer Violence:   . Fear of Current or  Ex-Partner: Not on file  . Emotionally Abused: Not on file  . Physically Abused: Not on file  . Sexually Abused: Not on file   Family History  Problem Relation Age of Onset  . Heart attack Mother   . Heart attack Father     Objective: Office vital signs reviewed. There were no vitals taken for this visit.  Physical Examination:  General: Awake, alert, well nourished, No acute distress HEENT: Normal, sclera white, MMM Cardio: NSR. S1S2 heard, no murmurs appreciated Pulm: clear to auscultation bilaterally, no wheezes, rhonchi or rales; normal work of breathing on room air Extremities: 1+ pitting edema to mid shins MSK: Valgus deformity of the knees.  Ambulates with cane  Assessment/ Plan: 86 y.o. female    1. Paroxysmal atrial fibrillation (HCC) NSR today.  INR therapeutic at 2.4.  Continue current regimen.  Return appt scheduled for 6 weeks. - CoaguChek XS/INR Waived - POCT INR  2. S/P mitral valve repair - CoaguChek XS/INR Waived - POCT INR  3. Long term current use of anticoagulant - CoaguChek XS/INR Waived - POCT INR   No orders of the defined types were placed in this encounter.  No orders of the defined types were placed in this encounter.    Melissa Norlander, DO [place] [phone]

## 2019-08-23 ENCOUNTER — Ambulatory Visit: Payer: MEDICARE | Admitting: *Deleted

## 2019-08-23 NOTE — Chronic Care Management (AMB) (Signed)
  Chronic Care Management   Outreach Note  08/23/2019 Name: [name] MRN: [id number] DOB: 10-Nov-1928  Referred by: [name], DO Reason for referral : Chronic Care Management (RN Follow-up)   An unsuccessful telephone follow-up was attempted today. The patient was referred to the case management team for assistance with care management and care coordination.   Follow Up Plan: The care management team will reach out to the patient again over the next 30 days.   Chong Sicilian, BSN, RN-BC Embedded Chronic Care Manager [facility] [place] [facility] Direct Dial: [phone]

## 2019-09-04 ENCOUNTER — Ambulatory Visit (INDEPENDENT_AMBULATORY_CARE_PROVIDER_SITE_OTHER): Payer: MEDICARE | Admitting: *Deleted

## 2019-09-04 DIAGNOSIS — Z950 Presence of cardiac pacemaker: Secondary | ICD-10-CM | POA: Diagnosis not present

## 2019-09-05 LAB — CUP PACEART REMOTE DEVICE CHECK
Battery Remaining Longevity: 82 mo
Battery Voltage: 2.98 V
Brady Statistic AP VP Percent: 10.55 %
Brady Statistic AP VS Percent: 58.32 %
Brady Statistic AS VP Percent: 4.77 %
Brady Statistic AS VS Percent: 26.35 %
Brady Statistic RA Percent Paced: 64.78 %
Brady Statistic RV Percent Paced: 17.12 %
Date Time Interrogation Session: 20210310035009
Implantable Lead Implant Date: 20081126
Implantable Lead Implant Date: 20081126
Implantable Lead Location: 753859
Implantable Lead Location: 753860
Implantable Lead Model: 5076
Implantable Lead Model: 5076
Implantable Pulse Generator Implant Date: 20180228
Lead Channel Impedance Value: 361 Ohm
Lead Channel Impedance Value: 380 Ohm
Lead Channel Impedance Value: 399 Ohm
Lead Channel Impedance Value: 399 Ohm
Lead Channel Pacing Threshold Amplitude: 1.125 V
Lead Channel Pacing Threshold Pulse Width: 0.4 ms
Lead Channel Sensing Intrinsic Amplitude: 0.25 mV
Lead Channel Sensing Intrinsic Amplitude: 0.25 mV
Lead Channel Sensing Intrinsic Amplitude: 7.75 mV
Lead Channel Sensing Intrinsic Amplitude: 7.75 mV
Lead Channel Setting Pacing Amplitude: 2.75 V
Lead Channel Setting Pacing Amplitude: 2.75 V
Lead Channel Setting Pacing Pulse Width: 0.4 ms
Lead Channel Setting Sensing Sensitivity: 2 mV

## 2019-09-05 NOTE — Progress Notes (Signed)
PPM Remote  

## 2019-09-12 ENCOUNTER — Ambulatory Visit (INDEPENDENT_AMBULATORY_CARE_PROVIDER_SITE_OTHER): Payer: MEDICARE | Admitting: Family Medicine

## 2019-09-12 ENCOUNTER — Other Ambulatory Visit: Payer: Self-pay

## 2019-09-12 ENCOUNTER — Encounter: Payer: Self-pay | Admitting: Family Medicine

## 2019-09-12 VITALS — BP 115/63 | HR 75 | Temp 97.4°F | Ht 68.0 in | Wt 160.0 lb

## 2019-09-12 DIAGNOSIS — R791 Abnormal coagulation profile: Secondary | ICD-10-CM

## 2019-09-12 DIAGNOSIS — Z9889 Other specified postprocedural states: Secondary | ICD-10-CM

## 2019-09-12 DIAGNOSIS — Z7901 Long term (current) use of anticoagulants: Secondary | ICD-10-CM

## 2019-09-12 DIAGNOSIS — I480 Paroxysmal atrial fibrillation: Secondary | ICD-10-CM | POA: Diagnosis not present

## 2019-09-12 LAB — COAGUCHEK XS/INR WAIVED
INR: 1.8 — ABNORMAL HIGH (ref 0.9–1.1)
Prothrombin Time: 22.1 s

## 2019-09-12 LAB — POCT INR: INR: 1.8 — AB (ref 2–3)

## 2019-09-12 NOTE — Progress Notes (Signed)
Subjective: CC: INR check PCP: [name], DO [id number] [name] is a 86 y.o. female presenting to clinic today for:  1. Afib/ MV Repair Cardiologist is Dr. Sallyanne Kuster.  Has pacemaker in place.  Goal INR 2-3.  Patient was last seen 6 weeks ago. Her INR was therapeutic.  She has been compliant with the warfarin taking 5 mg every Wednesday and Saturday and 2.5 mg daily all other days.  She denies any hematochezia, melena, hematuria, heart palpitations, shortness of breath.  She has eaten some greens since her last visit.  She wonders if this is what has impacted her INR today.   ROS: Per HPI  Allergies  Allergen Reactions  . Statins Other (See Comments)    Myalgia   Past Medical History:  Diagnosis Date  . A-fib ([place])   . Dyslipidemia   . Hypothyroidism    2 degree amidarone   . Pacemaker   . PAF (paroxysmal atrial fibrillation) ([place])   . S/P mitral valve repair   . SSS (sick sinus syndrome) ([place])   . Systemic hypertension     Current Outpatient Medications:  .  levothyroxine (SYNTHROID) 25 MCG tablet, TAKE 1 TABLET DAILY, Disp: 90 tablet, Rfl: 3 .  metoprolol tartrate (LOPRESSOR) 50 MG tablet, TAKE 1 TABLET TWICE A DAY, Disp: 180 tablet, Rfl: 3 .  Polyethyl Glycol-Propyl Glycol (SYSTANE OP), Apply 1 drop to eye daily as needed (dry eyes)., Disp: , Rfl:  .  Probiotic Product (PROBIOTIC PO), Take 2 tablets by mouth daily., Disp: , Rfl:  .  triamterene-hydrochlorothiazide (DYAZIDE) 37.5-25 MG capsule, TAKE 1 CAPSULE DAILY, Disp: 90 capsule, Rfl: 3 .  warfarin (COUMADIN) 2.5 MG tablet, TAKE 2 TABLETS (5 MG) ON MONDAY AND FRIDAY, THEN TAKE 1 TABLET ALL OTHER DAYS OF THE WEEK, Disp: 150 tablet, Rfl: 1 Social History   Socioeconomic History  . Marital status: Married    Spouse name: Not on file  . Number of children: 3  . Years of education: Not on file  . Highest education level: High school graduate  Occupational History  . Occupation: Retired  Tobacco Use  .  Smoking status: Never Smoker  . Smokeless tobacco: Never Used  Substance and Sexual Activity  . Alcohol use: No  . Drug use: Never  . Sexual activity: Not Currently  Other Topics Concern  . Not on file  Social History Narrative  . Not on file   Social Determinants of Health   Financial Resource Strain: Low Risk   . Difficulty of Paying Living Expenses: Not hard at all  Food Insecurity:   . Worried About Charity fundraiser in the Last Year:   . Arboriculturist in the Last Year:   Transportation Needs:   . Film/video editor (Medical):   Marland Kitchen Lack of Transportation (Non-Medical):   Physical Activity:   . Days of Exercise per Week:   . Minutes of Exercise per Session:   Stress:   . Feeling of Stress :   Social Connections: Somewhat Isolated  . Frequency of Communication with Friends and Family: More than three times a week  . Frequency of Social Gatherings with Friends and Family: More than three times a week  . Attends Religious Services: Never  . Active Member of Clubs or Organizations: No  . Attends Archivist Meetings: Never  . Marital Status: Married  Human resources officer Violence:   . Fear of Current or Ex-Partner:   . Emotionally Abused:   .  Physically Abused:   . Sexually Abused:    Family History  Problem Relation Age of Onset  . Heart attack Mother   . Heart attack Father   . Parkinson's disease Son     Objective: Office vital signs reviewed. BP 115/63   Pulse 75   Temp (!) 97.4 F (36.3 C) (Temporal)   Ht 5\' 8"  (1.727 m)   Wt 160 lb (72.6 kg)   SpO2 97%   BMI 24.33 kg/m   Physical Examination:  General: Awake, alert, well nourished, No acute distress HEENT: Normal, sclera white, MMM Cardio: NSR. S1S2 heard, no murmurs appreciated Pulm: clear to auscultation bilaterally, no wheezes, rhonchi or rales; normal work of breathing on room air MSK: Valgus deformity of the knees.  Ambulates with cane  Assessment/ Plan: 86 y.o. female   1.  Subtherapeutic international normalized ratio (INR) INR subtherapeutic today.  Have increased her to 2 tablets Monday Wednesday Friday and 1 tablet all other days.  She will repeat on next Friday.  This is been scheduled. - POCT INR  2. Paroxysmal atrial fibrillation (HCC) - CoaguChek XS/INR Waived - POCT INR  3. S/P mitral valve repair - CoaguChek XS/INR Waived - POCT INR  4. Long term current use of anticoagulant - CoaguChek XS/INR Waived - POCT INR    No orders of the defined types were placed in this encounter.  No orders of the defined types were placed in this encounter.    [name], DO [place] [phone]

## 2019-09-12 NOTE — Patient Instructions (Signed)
Increase to 2 tablet Monday, Wednesday and Friday this week. Continue 1 tablet daily all other days.    Next week, go back to normal dosing schedule.   See me back next Friday.

## 2019-09-21 ENCOUNTER — Ambulatory Visit (INDEPENDENT_AMBULATORY_CARE_PROVIDER_SITE_OTHER): Payer: MEDICARE | Admitting: Family Medicine

## 2019-09-21 ENCOUNTER — Other Ambulatory Visit: Payer: Self-pay

## 2019-09-21 ENCOUNTER — Encounter: Payer: Self-pay | Admitting: Family Medicine

## 2019-09-21 DIAGNOSIS — H04129 Dry eye syndrome of unspecified lacrimal gland: Secondary | ICD-10-CM

## 2019-09-21 DIAGNOSIS — I480 Paroxysmal atrial fibrillation: Secondary | ICD-10-CM | POA: Diagnosis not present

## 2019-09-21 DIAGNOSIS — Z9889 Other specified postprocedural states: Secondary | ICD-10-CM | POA: Diagnosis not present

## 2019-09-21 DIAGNOSIS — Z7901 Long term (current) use of anticoagulants: Secondary | ICD-10-CM | POA: Diagnosis not present

## 2019-09-21 LAB — POCT INR: INR: 3 (ref 2–3)

## 2019-09-21 LAB — COAGUCHEK XS/INR WAIVED
INR: 3 — ABNORMAL HIGH (ref 0.9–1.1)
Prothrombin Time: 36.5 s

## 2019-09-21 NOTE — Patient Instructions (Signed)
Today, you can just take 1 tablet but then go back to 2 tablets on Wednesdays and Fridays with 1 tablet daily all other days.

## 2019-09-21 NOTE — Progress Notes (Signed)
Subjective: CC: INR check PCP: [name], DO [id number] H Viegas is a 86 y.o. female presenting to clinic today for:  1. Afib/ MV Repair Cardiologist is Dr. Sallyanne Kuster.  Has pacemaker in place.  Goal INR 2-3.  Patient was last seen 10 days ago.  INR was subtherapeutic at one-point.  She is instructed to increase her medicine to 2 tablets Monday Wednesday Friday with 1 tablet daily all other days.  She is here for repeat check.  She has been doing fine on the current dosing schedule.  No hematochezia, melena, hematuria.  2.  Dry eye Patient reports bilateral dry eyes that seem to be worsened by using a mask.  She has eyedrops at home but has not been using them regularly.  Not on any oral vitamins for eyes.  No visual disturbance  ROS: Per HPI  Allergies  Allergen Reactions  . Statins Other (See Comments)    Myalgia   Past Medical History:  Diagnosis Date  . A-fib ([place])   . Dyslipidemia   . Hypothyroidism    2 degree amidarone   . Pacemaker   . PAF (paroxysmal atrial fibrillation) ([place])   . S/P mitral valve repair   . SSS (sick sinus syndrome) ([place])   . Systemic hypertension     Current Outpatient Medications:  .  levothyroxine (SYNTHROID) 25 MCG tablet, TAKE 1 TABLET DAILY, Disp: 90 tablet, Rfl: 3 .  metoprolol tartrate (LOPRESSOR) 50 MG tablet, TAKE 1 TABLET TWICE A DAY, Disp: 180 tablet, Rfl: 3 .  Polyethyl Glycol-Propyl Glycol (SYSTANE OP), Apply 1 drop to eye daily as needed (dry eyes)., Disp: , Rfl:  .  Probiotic Product (PROBIOTIC PO), Take 2 tablets by mouth daily., Disp: , Rfl:  .  triamterene-hydrochlorothiazide (DYAZIDE) 37.5-25 MG capsule, TAKE 1 CAPSULE DAILY, Disp: 90 capsule, Rfl: 3 .  warfarin (COUMADIN) 2.5 MG tablet, TAKE 2 TABLETS (5 MG) ON MONDAY AND FRIDAY, THEN TAKE 1 TABLET ALL OTHER DAYS OF THE WEEK, Disp: 150 tablet, Rfl: 1 Social History   Socioeconomic History  . Marital status: Married    Spouse name: Not on file  . Number of  children: 3  . Years of education: Not on file  . Highest education level: High school graduate  Occupational History  . Occupation: Retired  Tobacco Use  . Smoking status: Never Smoker  . Smokeless tobacco: Never Used  Substance and Sexual Activity  . Alcohol use: No  . Drug use: Never  . Sexual activity: Not Currently  Other Topics Concern  . Not on file  Social History Narrative  . Not on file   Social Determinants of Health   Financial Resource Strain: Low Risk   . Difficulty of Paying Living Expenses: Not hard at all  Food Insecurity:   . Worried About Charity fundraiser in the Last Year:   . Arboriculturist in the Last Year:   Transportation Needs:   . Film/video editor (Medical):   Marland Kitchen Lack of Transportation (Non-Medical):   Physical Activity:   . Days of Exercise per Week:   . Minutes of Exercise per Session:   Stress:   . Feeling of Stress :   Social Connections: Somewhat Isolated  . Frequency of Communication with Friends and Family: More than three times a week  . Frequency of Social Gatherings with Friends and Family: More than three times a week  . Attends Religious Services: Never  . Active Member of Clubs or  Organizations: No  . Attends Archivist Meetings: Never  . Marital Status: Married  Human resources officer Violence:   . Fear of Current or Ex-Partner:   . Emotionally Abused:   Marland Kitchen Physically Abused:   . Sexually Abused:    Family History  Problem Relation Age of Onset  . Heart attack Mother   . Heart attack Father   . Parkinson's disease Son     Objective: Office vital signs reviewed. There were no vitals taken for this visit.  Physical Examination:  General: Awake, alert, well nourished, No acute distress HEENT: Normal, sclera white, MMM Cardio: NSR. S1S2 heard, no murmurs appreciated Pulm: clear to auscultation bilaterally, no wheezes, rhonchi or rales; normal work of breathing on room air MSK: Valgus deformity of the knees.   Ambulates with cane  Assessment/ Plan: 86 y.o. female   1. Paroxysmal atrial fibrillation (HCC) INR therapeutic at 3.0 today.  Resume 2 tablets Wednesdays and Fridays with 1 tablet daily all other days.  May follow-up in 4 weeks - CoaguChek XS/INR Waived - POCT INR  2. S/P mitral valve repair - CoaguChek XS/INR Waived - POCT INR  3. Long term current use of anticoagulant - CoaguChek XS/INR Waived - POCT INR  4. Dry eye Reinforced daily use of lubricant eyedrop.  Could consider Ocuvite.   No orders of the defined types were placed in this encounter.  No orders of the defined types were placed in this encounter.    [name], DO [place] [phone]

## 2019-11-22 ENCOUNTER — Telehealth: Payer: Self-pay | Admitting: Internal Medicine

## 2019-12-04 ENCOUNTER — Ambulatory Visit (INDEPENDENT_AMBULATORY_CARE_PROVIDER_SITE_OTHER): Payer: MEDICARE | Admitting: *Deleted

## 2019-12-04 DIAGNOSIS — I495 Sick sinus syndrome: Secondary | ICD-10-CM

## 2019-12-04 LAB — CUP PACEART REMOTE DEVICE CHECK
Battery Remaining Longevity: 72 mo
Battery Voltage: 2.97 V
Brady Statistic AP VP Percent: 12.2 %
Brady Statistic AP VS Percent: 63.02 %
Brady Statistic AS VP Percent: 4.73 %
Brady Statistic AS VS Percent: 20.03 %
Brady Statistic RA Percent Paced: 71.19 %
Brady Statistic RV Percent Paced: 18.72 %
Date Time Interrogation Session: 20210608021214
Implantable Lead Implant Date: 20081126
Implantable Lead Implant Date: 20081126
Implantable Lead Location: 753859
Implantable Lead Location: 753860
Implantable Lead Model: 5076
Implantable Lead Model: 5076
Implantable Pulse Generator Implant Date: 20180228
Lead Channel Impedance Value: 342 Ohm
Lead Channel Impedance Value: 342 Ohm
Lead Channel Impedance Value: 380 Ohm
Lead Channel Impedance Value: 380 Ohm
Lead Channel Pacing Threshold Amplitude: 1.25 V
Lead Channel Pacing Threshold Pulse Width: 0.4 ms
Lead Channel Sensing Intrinsic Amplitude: 0.375 mV
Lead Channel Sensing Intrinsic Amplitude: 0.375 mV
Lead Channel Sensing Intrinsic Amplitude: 8.75 mV
Lead Channel Sensing Intrinsic Amplitude: 8.75 mV
Lead Channel Setting Pacing Amplitude: 2.75 V
Lead Channel Setting Pacing Amplitude: 3.25 V
Lead Channel Setting Pacing Pulse Width: 0.4 ms
Lead Channel Setting Sensing Sensitivity: 2 mV

## 2019-12-05 NOTE — Progress Notes (Signed)
Remote pacemaker transmission.   

## 2019-12-31 ENCOUNTER — Other Ambulatory Visit: Payer: Self-pay | Admitting: Family Medicine

## 2020-01-07 ENCOUNTER — Telehealth: Payer: Self-pay | Admitting: Family Medicine

## 2020-01-07 NOTE — Telephone Encounter (Signed)
Appt give for INR recheck this week and chronic follow up appt made with PCP for next month at first available.

## 2020-01-09 ENCOUNTER — Other Ambulatory Visit: Payer: Self-pay

## 2020-01-09 ENCOUNTER — Ambulatory Visit (INDEPENDENT_AMBULATORY_CARE_PROVIDER_SITE_OTHER): Payer: MEDICARE | Admitting: Family Medicine

## 2020-01-09 ENCOUNTER — Encounter: Payer: Self-pay | Admitting: Family Medicine

## 2020-01-09 VITALS — BP 134/75 | HR 73 | Temp 97.2°F | Ht 68.0 in | Wt 158.0 lb

## 2020-01-09 DIAGNOSIS — I480 Paroxysmal atrial fibrillation: Secondary | ICD-10-CM | POA: Diagnosis not present

## 2020-01-09 LAB — COAGUCHEK XS/INR WAIVED
INR: 1.9 — ABNORMAL HIGH (ref 0.9–1.1)
Prothrombin Time: 23.1 s

## 2020-01-09 NOTE — Progress Notes (Signed)
Assessment & Plan:  1. Paroxysmal atrial fibrillation [facility]) Description   Take 2 tablets today and Friday as scheduled - take an extra tablet tomorrow. Return for re-check of INR at the end of next week.     - CoaguChek XS/INR Waived   Follow up plan: Return in about 1 week (around 01/16/2020) for routine f/u with PCP with INR.  Hendricks Limes, MSN, APRN, FNP-C [facility]  Subjective:   Patient ID: [name], female    DOB: [birth year], 86 y.o.   MRN: [id number]  HPI: [name] is a 86 y.o. female presenting on 01/09/2020 for Coagulation Disorder  Anticoagulation: Patient here for anticoagulation monitoring. She has not had an INR since 09/21/2019.  Indication: atrial fibrillation and MV repair Bleeding Signs/Symptoms:  None Thromboembolic Signs/Symptoms:  None  Missed Coumadin Doses:  None Medication Changes:  no Dietary Changes:  no Bacterial/Viral Infection:  no  Other Concerns:  no   ROS: Negative unless specifically indicated above in HPI.   Relevant past medical history reviewed and updated as indicated.   Allergies and medications reviewed and updated.   Current Outpatient Medications:    levothyroxine (SYNTHROID) 25 MCG tablet, TAKE 1 TABLET DAILY, Disp: 90 tablet, Rfl: 1   metoprolol tartrate (LOPRESSOR) 50 MG tablet, TAKE 1 TABLET TWICE A DAY, Disp: 180 tablet, Rfl: 3   Polyethyl Glycol-Propyl Glycol (SYSTANE OP), Apply 1 drop to eye daily as needed (dry eyes)., Disp: , Rfl:    Probiotic Product (PROBIOTIC PO), Take 2 tablets by mouth daily., Disp: , Rfl:    triamterene-hydrochlorothiazide (DYAZIDE) 37.5-25 MG capsule, TAKE 1 CAPSULE DAILY, Disp: 90 capsule, Rfl: 3   warfarin (COUMADIN) 2.5 MG tablet, TAKE 2 TABLETS (5 MG) ON MONDAY AND FRIDAY, THEN TAKE 1 TABLET ALL OTHER DAYS OF THE WEEK, Disp: 150 tablet, Rfl: 1  Allergies  Allergen Reactions   Statins Other (See Comments)    Myalgia    Objective:   BP  134/75    Pulse 73    Temp (!) 97.2 F (36.2 C) (Temporal)    Ht 5\' 8"  (1.727 m)    Wt 158 lb (71.7 kg)    SpO2 99%    BMI 24.02 kg/m    Physical Exam Vitals reviewed.  Constitutional:      General: She is not in acute distress.    Appearance: Normal appearance. She is normal weight. She is not ill-appearing, toxic-appearing or diaphoretic.  HENT:     Head: Normocephalic and atraumatic.  Eyes:     General: No scleral icterus.       Right eye: No discharge.        Left eye: No discharge.     Conjunctiva/sclera: Conjunctivae normal.  Cardiovascular:     Rate and Rhythm: Normal rate and regular rhythm.     Heart sounds: Normal heart sounds. No murmur heard.  No friction rub. No gallop.   Pulmonary:     Effort: Pulmonary effort is normal. No respiratory distress.     Breath sounds: Normal breath sounds. No stridor. No wheezing, rhonchi or rales.  Musculoskeletal:        General: Normal range of motion.     Cervical back: Normal range of motion.  Skin:    General: Skin is warm and dry.     Capillary Refill: Capillary refill takes less than 2 seconds.  Neurological:     General: No focal deficit present.     Mental Status: She  is alert and oriented to person, place, and time. Mental status is at baseline.  Psychiatric:        Mood and Affect: Mood normal.        Behavior: Behavior normal.        Thought Content: Thought content normal.        Judgment: Judgment normal.

## 2020-01-14 ENCOUNTER — Encounter: Payer: Self-pay | Admitting: Cardiovascular Disease

## 2020-01-14 ENCOUNTER — Other Ambulatory Visit: Payer: Self-pay

## 2020-01-14 ENCOUNTER — Ambulatory Visit (INDEPENDENT_AMBULATORY_CARE_PROVIDER_SITE_OTHER): Payer: MEDICARE | Admitting: Cardiovascular Disease

## 2020-01-14 VITALS — BP 118/54 | HR 73 | Ht 68.0 in | Wt 156.6 lb

## 2020-01-14 DIAGNOSIS — I480 Paroxysmal atrial fibrillation: Secondary | ICD-10-CM | POA: Diagnosis not present

## 2020-01-14 DIAGNOSIS — I495 Sick sinus syndrome: Secondary | ICD-10-CM

## 2020-01-14 DIAGNOSIS — Z7901 Long term (current) use of anticoagulants: Secondary | ICD-10-CM | POA: Diagnosis not present

## 2020-01-14 DIAGNOSIS — Z950 Presence of cardiac pacemaker: Secondary | ICD-10-CM

## 2020-01-14 DIAGNOSIS — Z9889 Other specified postprocedural states: Secondary | ICD-10-CM | POA: Diagnosis not present

## 2020-01-14 DIAGNOSIS — I10 Essential (primary) hypertension: Secondary | ICD-10-CM | POA: Diagnosis not present

## 2020-01-14 NOTE — Patient Instructions (Signed)

## 2020-01-14 NOTE — Progress Notes (Signed)
Cardiology office note   Date:  01/14/2020   ID:  ROZETTA STUMPP, DOB February 17, 1929, MRN [id number] PCP:  [name], DO  Cardiologist:  Lavonn Maxcy Electrophysiologist:  None   Evaluation Performed:  Follow-Up Visit  Chief Complaint:  Afib, pacemaker follow-up  History of Present Illness:    [name] is a 86 y.o. female with remote history of mitral valve annuloplasty and surgical ablation, sinus node dysfunction and paroxysmal atrial fibrillation with a dual-chamber permanent pacemaker (Medtronic 2008, original 5076 leads, Azure generator change out 2018).  She is generally doing well and continues to take care of the whole household in her garden as well as her husband who has had 3 surgeries this year.  She does not have any cardiovascular complaints.  Interrogation of her pacemaker shows a fairly stable burden of 14% of atrial mode switch.  She has unipolar atrial sensing programmed due to deteriorating parameters on her atrial lead.  Judging by the myopotential artifact noticed on the atrial lead today with isometric testing, I suspect that a lot of the so-called atrial fibrillation she represents oversensing.  In the past there were clearly also some episodes of true paroxysmal atrial fibrillation.  Unipolar sensing shows P waves of 0.5-0.8 mV.  Bipolar sensing is also mediocre with P waves of 0.3-0.6 mV, but there is less interference from myopotentials.  Atrial sensing was reprogrammed to bipolar vector with a sensitivity of 0.1 mV.  Otherwise she has roughly 75% atrial pacing and 19% ventricular pacing.  She is not pacemaker dependent.  Estimated generator longevity is just under 6 years.  There have been no recent episodes of high ventricular rate.  The patient specifically denies any chest pain at rest exertion, dyspnea at rest or with exertion, orthopnea, paroxysmal nocturnal dyspnea, syncope, palpitations, focal neurological deficits, intermittent claudication,  lower extremity edema, unexplained weight gain, cough, hemoptysis or wheezing.  She has hypertension. She has a history of mitral valve annuloplasty and Cox Maze cryoablation procedure via minithoracotomy in 2009, has preserved left ventricular systolic function, no residual mitral insufficiency, normal coronary arteries at preoperative angiogram.   Past Medical History:  Diagnosis Date  . A-fib ([place])   . Dyslipidemia   . Hypothyroidism    2 degree amidarone   . Pacemaker   . PAF (paroxysmal atrial fibrillation) ([place])   . S/P mitral valve repair   . SSS (sick sinus syndrome) ([place])   . Systemic hypertension    Past Surgical History:  Procedure Laterality Date  . ABDOMINAL HYSTERECTOMY  1989  . CARDIAC CATHETERIZATION  01/11/2008   mild-mod pulm. hypertension,normal coronaries  . MINIMALLY INVASIVE MAZE PROCEDURE  02/2008  . MITRAL VALVE REPAIR  September 2009   [facility] ring annuloplasty  . NM MYOVIEW LTD  03/28/2007   no ischemia  . PACEMAKER PLACEMENT  November 2008  . PPM GENERATOR CHANGEOUT N/A 08/25/2016   Procedure: PPM Generator Changeout - Battery Only;  Surgeon: Sanda Klein, MD;  Location: [facility];  Service: Cardiovascular;  Laterality: N/A;  . US ECHOCARDIOGRAPHY  03/07/2012   mild LVH,EF =>55%,mild MR,mod TR,trace AI,right atrium mod. dilated     Current Meds  Medication Sig  . levothyroxine (SYNTHROID) 25 MCG tablet TAKE 1 TABLET DAILY  . metoprolol tartrate (LOPRESSOR) 50 MG tablet TAKE 1 TABLET TWICE A DAY  . Polyethyl Glycol-Propyl Glycol (SYSTANE OP) Apply 1 drop to eye daily as needed (dry eyes).  . Probiotic Product (PROBIOTIC PO) Take 2 tablets by  mouth daily.  Marland Kitchen triamterene-hydrochlorothiazide (DYAZIDE) 37.5-25 MG capsule TAKE 1 CAPSULE DAILY  . warfarin (COUMADIN) 2.5 MG tablet TAKE 2 TABLETS (5 MG) ON MONDAY AND FRIDAY, THEN TAKE 1 TABLET ALL OTHER DAYS OF THE WEEK     Allergies:   Statins   Social History   Tobacco Use  . Smoking  status: Never Smoker  . Smokeless tobacco: Never Used  Vaping Use  . Vaping Use: Never used  Substance Use Topics  . Alcohol use: No  . Drug use: Never     Family Hx: The patient's family history includes Heart attack in her father and mother; Parkinson's disease in her son.  ROS:   Please see the history of present illness.    All other systems reviewed and are negative.   Prior CV studies:   The following studies were reviewed today: Comprehensive pacemaker downloads from December, March, June 2020  Labs/Other Tests and Data Reviewed:    EKG: An ECG performed today shows normal sinus rhythm at about 70 bpm.  The P waves are really tiny and hard to see.  Otherwise normal tracing.  Recent Labs: 06/13/2019: BUN 22; Creatinine, Ser 1.25; Hemoglobin 12.7; Platelets 191; Potassium 3.9; Sodium 142; TSH 1.310   Recent Lipid Panel Lab Results  Component Value Date/Time   CHOL 223 (H) 05/21/2016 09:54 AM   TRIG 120 05/21/2016 09:54 AM   HDL 52 05/21/2016 09:54 AM   CHOLHDL 4.3 05/21/2016 09:54 AM   CHOLHDL 4.8 12/26/2012 10:57 AM   LDLCALC 147 (H) 05/21/2016 09:54 AM    Wt Readings from Last 3 Encounters:  01/14/20 156 lb 9.6 oz (71 kg)  01/09/20 158 lb (71.7 kg)  09/12/19 160 lb (72.6 kg)     Objective:    Vital Signs:  BP (!) 118/54   Pulse 73   Ht 5\' 8"  (1.727 m)   Wt 156 lb 9.6 oz (71 kg)   SpO2 98%   BMI 23.81 kg/m    General: Alert, oriented x3, no distress, healthy pacemaker site Head: no evidence of trauma, PERRL, EOMI, no exophtalmos or lid lag, no myxedema, no xanthelasma; normal ears, nose and oropharynx Neck: normal jugular venous pulsations and no hepatojugular reflux; brisk carotid pulses without delay and no carotid bruits Chest: clear to auscultation, no signs of consolidation by percussion or palpation, normal fremitus, symmetrical and full respiratory excursions Cardiovascular: normal position and quality of the apical impulse, regular rhythm,  normal first and second heart sounds, no murmurs, rubs or gallops Abdomen: no tenderness or distention, no masses by palpation, no abnormal pulsatility or arterial bruits, normal bowel sounds, no hepatosplenomegaly Extremities: no clubbing, cyanosis or edema; 2+ radial, ulnar and brachial pulses bilaterally; 2+ right femoral, posterior tibial and dorsalis pedis pulses; 2+ left femoral, posterior tibial and dorsalis pedis pulses; no subclavian or femoral bruits Neurological: grossly nonfocal Psych: Normal mood and affect   ASSESSMENT & PLAN:    1. Paroxysmal atrial fibrillation (HCC)   2. Long term current use of anticoagulant   3. Tachy-brady syndrome ([place])   4. Pacemaker   5. Essential hypertension   6. History of mitral valve repair      1. AFib: The overall burden of atrial fibrillation is hard to assess but is much lower than that reported by her device, due to the frequent atrial over sensing.  Overall burden appears lower compared to last year.  On appropriate anticoagulation. CHADSVasc 4 (age 41, HTN, gender).  Although she has a history  of mitral valve repair she did not have rheumatic valvular heart disease.  2. Warfarin: Monitored by PCP in [place].  She prefers to continue Coumadin and does not want to switch to a direct oral anticoagulant. 3. Tachy-bradycardia sd: With current sensor settings the heart rate histogram appears appropriate for her level of activity, which is remarkably high for her age. 4. PM: Atrial lead sensing and pacing are mediocre, but today not really interfering in a major manner with device function.  Consider atrial lead replacement when she needs a generator change.  However, the problem with the very low voltage P waves may be more related to her previous Maze procedure and may not be resolved with a new atrial lead.  Will reevaluate in the office to see if we have improve the atrial over sensing problems at her next device check in 6 months. 5. HTN:  Well-controlled.   6. Hx MV repair: No symptoms of heart failure.  No murmur at her last exam in person.  Last echocardiogram performed in 2013.   Patient Instructions  Medication Instructions:  No changes *If you need a refill on your cardiac medications before your next appointment, please call your pharmacy*   Lab Work: None ordered If you have labs (blood work) drawn today and your tests are completely normal, you will receive your results only by: Marland Kitchen MyChart Message (if you have MyChart) OR . A paper copy in the mail If you have any lab test that is abnormal or we need to change your treatment, we will call you to review the results.   Testing/Procedures: None ordered   Follow-Up: At [facility], you and your health needs are our priority.  As part of our continuing mission to provide you with exceptional heart care, we have created designated Provider Care Teams.  These Care Teams include your primary Cardiologist (physician) and Advanced Practice Providers (APPs -  Physician Assistants and Nurse Practitioners) who all work together to provide you with the care you need, when you need it.  We recommend signing up for the patient portal called "MyChart".  Sign up information is provided on this After Visit Summary.  MyChart is used to connect with patients for Virtual Visits (Telemedicine).  Patients are able to view lab/test results, encounter notes, upcoming appointments, etc.  Non-urgent messages can be sent to your provider as well.   To learn more about what you can do with MyChart, go to [email or web address].    Your next appointment:   6 month(s)  The format for your next appointment:   In Person  Provider:   Sanda Klein, MD     Signed, Sanda Klein, MD  01/14/2020 6:26 PM    [place]

## 2020-01-16 ENCOUNTER — Ambulatory Visit (INDEPENDENT_AMBULATORY_CARE_PROVIDER_SITE_OTHER): Payer: MEDICARE | Admitting: Family Medicine

## 2020-01-16 ENCOUNTER — Encounter: Payer: Self-pay | Admitting: Family Medicine

## 2020-01-16 ENCOUNTER — Other Ambulatory Visit: Payer: Self-pay

## 2020-01-16 VITALS — BP 128/67 | HR 72 | Temp 96.9°F | Ht 68.0 in | Wt 158.0 lb

## 2020-01-16 DIAGNOSIS — Z9889 Other specified postprocedural states: Secondary | ICD-10-CM

## 2020-01-16 DIAGNOSIS — N1832 Chronic kidney disease, stage 3b: Secondary | ICD-10-CM

## 2020-01-16 DIAGNOSIS — I480 Paroxysmal atrial fibrillation: Secondary | ICD-10-CM

## 2020-01-16 DIAGNOSIS — Z7901 Long term (current) use of anticoagulants: Secondary | ICD-10-CM

## 2020-01-16 DIAGNOSIS — E039 Hypothyroidism, unspecified: Secondary | ICD-10-CM

## 2020-01-16 DIAGNOSIS — I10 Essential (primary) hypertension: Secondary | ICD-10-CM

## 2020-01-16 LAB — COAGUCHEK XS/INR WAIVED
INR: 2.8 — ABNORMAL HIGH (ref 0.9–1.1)
Prothrombin Time: 34 s

## 2020-01-16 LAB — POCT INR: INR: 2.8 (ref 2–3)

## 2020-01-16 NOTE — Telephone Encounter (Signed)
Close this encounter

## 2020-01-16 NOTE — Patient Instructions (Signed)

## 2020-01-16 NOTE — Progress Notes (Signed)
Subjective: CC: INR check PCP: Melissa Norlander, DO [name] is a 86 y.o. female presenting to clinic today for:  1. Afib/ MV Repair Cardiologist is Dr. Sallyanne Kuster.  Has pacemaker in place.  Goal INR 2-3.  Patient was last seen 1 week ago and INR was subtherapeutic at 1.9.  She was instructed take 2 tablets 3 days/week with 1 tablet daily all others until she was rechecked.  She notes that she did not understand these instructions and just kept taking her medication normally.  Denies any hematochezia, melena..  2. Hypothyroidism Compliant with Synthroid.  No voice changes, tremor, heart palpitations, change in bowel habits.  3.  Hypertension/CKD3 Compliant with her blood pressure medications.  She is not on an ACE or an ARB.  No chest pain.  She does have some ankle edema.  ROS: Per HPI  Allergies  Allergen Reactions  . Statins Other (See Comments)    Myalgia   Past Medical History:  Diagnosis Date  . A-fib ([place])   . Dyslipidemia   . Hypothyroidism    2 degree amidarone   . Pacemaker   . PAF (paroxysmal atrial fibrillation) ([place])   . S/P mitral valve repair   . SSS (sick sinus syndrome) ([place])   . Systemic hypertension     Current Outpatient Medications:  .  levothyroxine (SYNTHROID) 25 MCG tablet, TAKE 1 TABLET DAILY, Disp: 90 tablet, Rfl: 1 .  metoprolol tartrate (LOPRESSOR) 50 MG tablet, TAKE 1 TABLET TWICE A DAY, Disp: 180 tablet, Rfl: 3 .  Polyethyl Glycol-Propyl Glycol (SYSTANE OP), Apply 1 drop to eye daily as needed (dry eyes)., Disp: , Rfl:  .  Probiotic Product (PROBIOTIC PO), Take 2 tablets by mouth daily., Disp: , Rfl:  .  triamterene-hydrochlorothiazide (DYAZIDE) 37.5-25 MG capsule, TAKE 1 CAPSULE DAILY, Disp: 90 capsule, Rfl: 3 .  warfarin (COUMADIN) 2.5 MG tablet, TAKE 2 TABLETS (5 MG) ON MONDAY AND FRIDAY, THEN TAKE 1 TABLET ALL OTHER DAYS OF THE WEEK, Disp: 150 tablet, Rfl: 1 Social History   Socioeconomic History  . Marital status:  Married    Spouse name: Not on file  . Number of children: 3  . Years of education: Not on file  . Highest education level: High school graduate  Occupational History  . Occupation: Retired  Tobacco Use  . Smoking status: Never Smoker  . Smokeless tobacco: Never Used  Vaping Use  . Vaping Use: Never used  Substance and Sexual Activity  . Alcohol use: No  . Drug use: Never  . Sexual activity: Not Currently  Other Topics Concern  . Not on file  Social History Narrative  . Not on file   Social Determinants of Health   Financial Resource Strain: Low Risk   . Difficulty of Paying Living Expenses: Not hard at all  Food Insecurity:   . Worried About Charity fundraiser in the Last Year:   . Arboriculturist in the Last Year:   Transportation Needs:   . Film/video editor (Medical):   Marland Kitchen Lack of Transportation (Non-Medical):   Physical Activity:   . Days of Exercise per Week:   . Minutes of Exercise per Session:   Stress:   . Feeling of Stress :   Social Connections: Moderately Isolated  . Frequency of Communication with Friends and Family: More than three times a week  . Frequency of Social Gatherings with Friends and Family: More than three times a week  . Attends  Religious Services: Never  . Active Member of Clubs or Organizations: No  . Attends Archivist Meetings: Never  . Marital Status: Married  Human resources officer Violence:   . Fear of Current or Ex-Partner:   . Emotionally Abused:   Marland Kitchen Physically Abused:   . Sexually Abused:    Family History  Problem Relation Age of Onset  . Heart attack Mother   . Heart attack Father   . Parkinson's disease Son     Objective: Office vital signs reviewed. BP 128/67   Pulse 72   Temp (!) 96.9 F (36.1 C) (Temporal)   Ht 5' 8" (1.727 m)   Wt 158 lb (71.7 kg)   SpO2 100%   BMI 24.02 kg/m   Physical Examination:  General: Awake, alert, well nourished, No acute distress HEENT: Normal, sclera white, MMM no  goiter.  Exophthalmos. Cardio: Regular rate and rhythm. S1S2 heard, no murmurs appreciated Pulm: clear to auscultation bilaterally, no wheezes, rhonchi or rales; normal work of breathing on room air MSK: Valgus deformity of the knees.  Ambulates with cane  Extremities: Edema noted of bilateral ankles  Assessment/ Plan: 86 y.o. female   1. Paroxysmal atrial fibrillation (HCC) Seemly rate and rhythm controlled today.  INR is therapeutic.  Continue current regimen.  Follow-up in 6 weeks - CoaguChek XS/INR Waived - CMP14+EGFR - CBC - POCT INR  2. S/P mitral valve repair - CoaguChek XS/INR Waived - POCT INR  3. Long term current use of anticoagulant - CoaguChek XS/INR Waived - POCT INR  4. Acquired hypothyroidism - Thyroid Panel With TSH - POCT INR  5. Essential hypertension Controlled  6. Stage 3b chronic kidney disease Check renal function - CMP14+EGFR - POCT INR   Orders Placed This Encounter  Procedures  . CoaguChek XS/INR Waived  . CMP14+EGFR  . CBC  . Thyroid Panel With TSH   No orders of the defined types were placed in this encounter.    Melissa Norlander, DO [place] [phone]

## 2020-01-17 LAB — CMP14+EGFR
ALT: 11 IU/L (ref 0–32)
AST: 16 IU/L (ref 0–40)
Albumin/Globulin Ratio: 2.3 — ABNORMAL HIGH (ref 1.2–2.2)
Albumin: 4.3 g/dL (ref 3.6–4.6)
Alkaline Phosphatase: 60 IU/L (ref 48–121)
BUN/Creatinine Ratio: 22 (ref 12–28)
BUN: 22 mg/dL (ref 8–27)
Bilirubin Total: 0.3 mg/dL (ref 0.0–1.2)
CO2: 25 mmol/L (ref 20–29)
Calcium: 9.2 mg/dL (ref 8.7–10.3)
Chloride: 100 mmol/L (ref 96–106)
Creatinine, Ser: 0.99 mg/dL (ref 0.57–1.00)
GFR calc Af Amer: 59 mL/min/{1.73_m2} — ABNORMAL LOW (ref >59)
GFR calc non Af Amer: 51 mL/min/{1.73_m2} — ABNORMAL LOW (ref >59)
Globulin, Total: 1.9 g/dL (ref 1.5–4.5)
Glucose: 113 mg/dL — ABNORMAL HIGH (ref 65–99)
Potassium: 3.7 mmol/L (ref 3.5–5.2)
Sodium: 141 mmol/L (ref 134–144)
Total Protein: 6.2 g/dL (ref 6.0–8.5)

## 2020-01-17 LAB — CBC
Hematocrit: 37.5 % (ref 34.0–46.6)
Hemoglobin: 12.4 g/dL (ref 11.1–15.9)
MCH: 29.9 pg (ref 26.6–33.0)
MCHC: 33.1 g/dL (ref 31.5–35.7)
MCV: 90 fL (ref 79–97)
Platelets: 206 10*3/uL (ref 150–450)
RBC: 4.15 x10E6/uL (ref 3.77–5.28)
RDW: 13.7 % (ref 11.7–15.4)
WBC: 6 10*3/uL (ref 3.4–10.8)

## 2020-01-17 LAB — THYROID PANEL WITH TSH
Free Thyroxine Index: 1.8 (ref 1.2–4.9)
T3 Uptake Ratio: 26 % (ref 24–39)
T4, Total: 6.9 ug/dL (ref 4.5–12.0)
TSH: 1.62 u[IU]/mL (ref 0.450–4.500)

## 2020-01-19 ENCOUNTER — Other Ambulatory Visit: Payer: Self-pay | Admitting: Family Medicine

## 2020-01-29 ENCOUNTER — Ambulatory Visit: Payer: Medicare Other | Admitting: Family Medicine

## 2020-01-29 NOTE — Progress Notes (Deleted)
Subjective: CC: INR check PCP: [name], DO CVE:LFYBO [name] is a 86 y.o. female presenting to clinic today for:  1. Afib/ MV Repair Cardiologist is Dr. Sallyanne Kuster.  Has pacemaker in place.  Goal INR 2-3.  Patient was last seen 1 week ago and INR was subtherapeutic at 1.9.  She was instructed take 2 tablets 3 days/week with 1 tablet daily all others until she was rechecked.  She notes that she did not understand these instructions and just kept taking her medication normally.  Denies any hematochezia, melena..  2. Hypothyroidism Compliant with Synthroid.  No voice changes, tremor, heart palpitations, change in bowel habits.  3.  Hypertension/CKD3 Compliant with her blood pressure medications.  She is not on an ACE or an ARB.  No chest pain.  She does have some ankle edema.  ROS: Per HPI  Allergies  Allergen Reactions  . Statins Other (See Comments)    Myalgia   Past Medical History:  Diagnosis Date  . A-fib ([place])   . Dyslipidemia   . Hypothyroidism    2 degree amidarone   . Pacemaker   . PAF (paroxysmal atrial fibrillation) ([place])   . S/P mitral valve repair   . SSS (sick sinus syndrome) ([place])   . Systemic hypertension     Current Outpatient Medications:  .  levothyroxine (SYNTHROID) 25 MCG tablet, TAKE 1 TABLET DAILY, Disp: 90 tablet, Rfl: 1 .  metoprolol tartrate (LOPRESSOR) 50 MG tablet, TAKE 1 TABLET TWICE A DAY, Disp: 180 tablet, Rfl: 3 .  Polyethyl Glycol-Propyl Glycol (SYSTANE OP), Apply 1 drop to eye daily as needed (dry eyes)., Disp: , Rfl:  .  triamterene-hydrochlorothiazide (DYAZIDE) 37.5-25 MG capsule, TAKE 1 CAPSULE DAILY, Disp: 90 capsule, Rfl: 3 .  warfarin (COUMADIN) 2.5 MG tablet, TAKE 2 TABLETS (5 MG) ON MONDAY AND FRIDAY, THEN TAKE 1 TABLET ALL OTHER DAYS OF THE WEEK, Disp: 150 tablet, Rfl: 0 Social History   Socioeconomic History  . Marital status: Married    Spouse name: Not on file  . Number of children: 3  . Years of education: Not  on file  . Highest education level: High school graduate  Occupational History  . Occupation: Retired  Tobacco Use  . Smoking status: Never Smoker  . Smokeless tobacco: Never Used  Vaping Use  . Vaping Use: Never used  Substance and Sexual Activity  . Alcohol use: No  . Drug use: Never  . Sexual activity: Not Currently  Other Topics Concern  . Not on file  Social History Narrative  . Not on file   Social Determinants of Health   Financial Resource Strain:   . Difficulty of Paying Living Expenses:   Food Insecurity:   . Worried About Charity fundraiser in the Last Year:   . Arboriculturist in the Last Year:   Transportation Needs:   . Film/video editor (Medical):   Marland Kitchen Lack of Transportation (Non-Medical):   Physical Activity:   . Days of Exercise per Week:   . Minutes of Exercise per Session:   Stress:   . Feeling of Stress :   Social Connections: Moderately Isolated  . Frequency of Communication with Friends and Family: More than three times a week  . Frequency of Social Gatherings with Friends and Family: More than three times a week  . Attends Religious Services: Never  . Active Member of Clubs or Organizations: No  . Attends Archivist Meetings: Never  .  Marital Status: Married  Human resources officer Violence:   . Fear of Current or Ex-Partner:   . Emotionally Abused:   Marland Kitchen Physically Abused:   . Sexually Abused:    Family History  Problem Relation Age of Onset  . Heart attack Mother   . Heart attack Father   . Parkinson's disease Son     Objective: Office vital signs reviewed. There were no vitals taken for this visit.  Physical Examination:  General: Awake, alert, well nourished, No acute distress HEENT: Normal, sclera white, MMM no goiter.  Exophthalmos. Cardio: Regular rate and rhythm. S1S2 heard, no murmurs appreciated Pulm: clear to auscultation bilaterally, no wheezes, rhonchi or rales; normal work of breathing on room air MSK: Valgus  deformity of the knees.  Ambulates with cane  Extremities: Edema noted of bilateral ankles  Assessment/ Plan: 86 y.o. female   ***  No orders of the defined types were placed in this encounter.  No orders of the defined types were placed in this encounter.    [name], DO [place] [phone]

## 2020-01-30 ENCOUNTER — Encounter: Payer: Self-pay | Admitting: Family Medicine

## 2020-03-04 ENCOUNTER — Encounter: Payer: Self-pay | Admitting: Nurse Practitioner

## 2020-03-04 ENCOUNTER — Other Ambulatory Visit: Payer: Self-pay

## 2020-03-04 ENCOUNTER — Ambulatory Visit (INDEPENDENT_AMBULATORY_CARE_PROVIDER_SITE_OTHER): Payer: MEDICARE | Admitting: Nurse Practitioner

## 2020-03-04 ENCOUNTER — Ambulatory Visit (INDEPENDENT_AMBULATORY_CARE_PROVIDER_SITE_OTHER): Payer: MEDICARE | Admitting: *Deleted

## 2020-03-04 VITALS — BP 117/63 | HR 80 | Temp 98.6°F | Ht 68.0 in | Wt 162.0 lb

## 2020-03-04 DIAGNOSIS — I10 Essential (primary) hypertension: Secondary | ICD-10-CM | POA: Diagnosis not present

## 2020-03-04 DIAGNOSIS — I480 Paroxysmal atrial fibrillation: Secondary | ICD-10-CM

## 2020-03-04 DIAGNOSIS — Z7901 Long term (current) use of anticoagulants: Secondary | ICD-10-CM | POA: Diagnosis not present

## 2020-03-04 DIAGNOSIS — I495 Sick sinus syndrome: Secondary | ICD-10-CM | POA: Diagnosis not present

## 2020-03-04 LAB — COAGUCHEK XS/INR WAIVED
INR: 2.8 — ABNORMAL HIGH (ref 0.9–1.1)
Prothrombin Time: 34.2 s

## 2020-03-04 NOTE — Assessment & Plan Note (Signed)
Patient is a 86 year old female who presents to clinic for follow-up of hypertension.  Patient was diagnosed in 2018.  Patient is tolerating her medication well without side effects.  Compliance with treatment has been good including taking medication as directed.  Patient tries to maintain a healthy diet.  Current medication is Lopressor 50 mg 1 tablet twice daily. Provided education to patient to continue maintaining a low-salt diet.  Printed handout provided to patient.  No changes to current dose.

## 2020-03-04 NOTE — Patient Instructions (Signed)
Prothrombin Time, International Normalized Ratio Test Why am I having this test? A prothrombin time (pro-time, PT) test may be ordered if:  You have certain medical conditions that cause abnormal bleeding or blood clotting. These can include: ? Liver disease. ? Systemic infection (sepsis). ? Inherited (genetic) bleeding disorders.  You are taking a medicine to prevent excessive blood clotting (anticoagulant), such as warfarin. ? If you are taking warfarin, you will likely be asked to have this test done at regular intervals. The results of this test will help your health care provider determine what dose of warfarin you need based on how quickly or slowly your blood clots. It is very important to have this test done as often as your health care provider recommends. What is being tested? A prothrombin time (pro-time, PT) test measures how many seconds it takes your blood to clot. The international normalized ratio (INR) is a calculation of blood clotting time based on your PT result. Most labs report both PT and INR values when reporting blood clotting times. What kind of sample is taken?  A blood sample is required for this test. It is usually collected by inserting a needle into a blood vessel. Tell a health care provider about:  Any blood disorders you have.  All medicines you are taking, including vitamins, herbs, eye drops, creams, and over-the-counter medicines. Do not stop, add, or change any medicines without letting your health care provider know.  The foods you regularly eat, especially foods that contain moderate or high amounts of vitamin K. It is important to eat a consistent amount of foods rich in vitamin K. Let your health care provider know if you have recently changed your diet.  If you drink alcohol. This can affect your lab results. How are the results reported? Your test results will be reported as values. Your health care provider will compare your results to normal  ranges that were established after testing a large group of people (reference ranges). Reference ranges may vary among different labs and hospitals. For this test, common reference ranges are:  Without anticoagulant treatment (control value): 11.0-12.5 seconds; 85-100%.  INR: 0.8-1.1. If you are taking warfarin, talk with your health care provider about what your INR result should be. Generally, an INR of 2.0-3.0 is desired for blood clot prevention. This depends on your medical conditions. What do the results mean?  A higher than normal PT or INR means that your blood takes longer to form a clot. This can result from: ? Certain medicines. ? Liver disease. ? Lack of certain proteins that form clots (coagulation factors). ? Lack of some vitamins.  A lower than normal PT or INR means that your blood can form a clot easily. This can result from: ? Supplements that contain Vitamin K. ? Medicines that contain estrogen, such as birth control pills or hormone replacement. ? Cancer. ? Some blood disorders (disseminated intravascular coagulation). Talk with your health care provider about what your test results mean. If you are taking warfarin or another anticoagulant, your result ranges may be different. Talk to your health care provider about what your results should be. Questions to ask your health care provider Ask your health care provider or the department that is doing the test:  When will my results be ready?  How will I get my results?  What are my treatment options?  What other tests do I need?  What are my next steps? Summary  A prothrombin time (pro-time, PT) test measures how   many seconds it takes your blood to clot.  You may have this test if you have a medical condition that causes abnormal bleeding or blood clotting, or if you are taking a medicine to prevent abnormal blood clotting.  A test result that is higher than normal indicates that your blood is taking too long  to form a clot. This result may occur because you lack some vitamins, take certain medicines, or have certain medical conditions.  Talk with your health care provider about what your results mean. This information is not intended to replace advice given to you by your health care provider. Make sure you discuss any questions you have with your health care provider. Document Revised: 07/30/2017 Document Reviewed: 07/30/2017 Elsevier Patient Education  2020 Elsevier Inc.  

## 2020-03-04 NOTE — Progress Notes (Signed)
Acute Office Visit  Subjective:    Patient ID: [name], female    DOB: [birth year], 86 y.o.   MRN: [id number]  Chief Complaint  Patient presents with  . Medical Management of Chronic Issues  . Atrial Fibrillation    HPI Patient is in today for INR check  Anticoagulation: Patient here for anticoagulation monitoring. Indication: atrial fibrillation Bleeding Signs/Symptoms:  None Thromboembolic Signs/Symptoms:  None  Missed Coumadin Doses:  None Medication Changes:  no Dietary Changes:  no Bacterial/Viral Infection:  no  Other Concerns:  no  Pt presents for follow up of hypertension. Patient was diagnosed in 2018 The patient is tolerating the medication well without side effects. Compliance with treatment has been good; including taking medication as directed , maintains a healthy diet and following up as directed.  Current medication Lopressor 50 mg 1 tablet twice daily  Past Medical History:  Diagnosis Date  . A-fib ([place])   . Dyslipidemia   . Hypothyroidism    2 degree amidarone   . Pacemaker   . PAF (paroxysmal atrial fibrillation) ([place])   . S/P mitral valve repair   . SSS (sick sinus syndrome) ([place])   . Systemic hypertension     Past Surgical History:  Procedure Laterality Date  . ABDOMINAL HYSTERECTOMY  1989  . CARDIAC CATHETERIZATION  01/11/2008   mild-mod pulm. hypertension,normal coronaries  . MINIMALLY INVASIVE MAZE PROCEDURE  02/2008  . MITRAL VALVE REPAIR  September 2009   [facility] ring annuloplasty  . NM MYOVIEW LTD  03/28/2007   no ischemia  . PACEMAKER PLACEMENT  November 2008  . PPM GENERATOR CHANGEOUT N/A 08/25/2016   Procedure: PPM Generator Changeout - Battery Only;  Surgeon: Sanda Klein, MD;  Location: [facility];  Service: Cardiovascular;  Laterality: N/A;  . US ECHOCARDIOGRAPHY  03/07/2012   mild LVH,EF =>55%,mild MR,mod TR,trace AI,right atrium mod. dilated    Family History  Problem Relation Age of Onset  . Heart  attack Mother   . Heart attack Father   . Parkinson's disease Son     Social History   Socioeconomic History  . Marital status: Married    Spouse name: Not on file  . Number of children: 3  . Years of education: Not on file  . Highest education level: High school graduate  Occupational History  . Occupation: Retired  Tobacco Use  . Smoking status: Never Smoker  . Smokeless tobacco: Never Used  Vaping Use  . Vaping Use: Never used  Substance and Sexual Activity  . Alcohol use: No  . Drug use: Never  . Sexual activity: Not Currently  Other Topics Concern  . Not on file  Social History Narrative  . Not on file      Outpatient Medications Prior to Visit  Medication Sig Dispense Refill  . levothyroxine (SYNTHROID) 25 MCG tablet TAKE 1 TABLET DAILY 90 tablet 1  . metoprolol tartrate (LOPRESSOR) 50 MG tablet TAKE 1 TABLET TWICE A DAY 180 tablet 3  . Polyethyl Glycol-Propyl Glycol (SYSTANE OP) Apply 1 drop to eye daily as needed (dry eyes).    . triamterene-hydrochlorothiazide (DYAZIDE) 37.5-25 MG capsule TAKE 1 CAPSULE DAILY 90 capsule 3  . warfarin (COUMADIN) 2.5 MG tablet TAKE 2 TABLETS (5 MG) ON MONDAY AND FRIDAY, THEN TAKE 1 TABLET ALL OTHER DAYS OF THE WEEK 150 tablet 0   No facility-administered medications prior to visit.    Allergies  Allergen Reactions  . Statins Other (See Comments)  Myalgia    Review of Systems  Constitutional: Negative.   Skin:       Dry  All other systems reviewed and are negative.      Objective:    Physical Exam Vitals reviewed.  Constitutional:      Appearance: Normal appearance.  HENT:     Head: Normocephalic.  Cardiovascular:     Rate and Rhythm: Normal rate and regular rhythm.     Pulses: Normal pulses.     Heart sounds: Normal heart sounds.  Pulmonary:     Effort: Pulmonary effort is normal.     Breath sounds: Normal breath sounds.  Abdominal:     General: Bowel sounds are normal.  Skin:    General: Skin is  dry.  Neurological:     Mental Status: She is alert and oriented to person, place, and time.  Psychiatric:        Mood and Affect: Mood normal.     BP 117/63   Pulse 80   Temp 98.6 F (37 C)   Ht 5\' 8"  (1.727 m)   Wt 162 lb (73.5 kg)   SpO2 99%   BMI 24.63 kg/m  Wt Readings from Last 3 Encounters:  03/04/20 162 lb (73.5 kg)  01/16/20 158 lb (71.7 kg)  01/14/20 156 lb 9.6 oz (71 kg)    Health Maintenance Due  Topic Date Due  . INFLUENZA VACCINE  01/27/2020     Lab Results  Component Value Date   TSH 1.620 01/16/2020   Lab Results  Component Value Date   WBC 6.0 01/16/2020   HGB 12.4 01/16/2020   HCT 37.5 01/16/2020   MCV 90 01/16/2020   PLT 206 01/16/2020   Lab Results  Component Value Date   NA 141 01/16/2020   K 3.7 01/16/2020   CO2 25 01/16/2020   GLUCOSE 113 (H) 01/16/2020   BUN 22 01/16/2020   CREATININE 0.99 01/16/2020   BILITOT 0.3 01/16/2020   ALKPHOS 60 01/16/2020   AST 16 01/16/2020   ALT 11 01/16/2020   PROT 6.2 01/16/2020   ALBUMIN 4.3 01/16/2020   CALCIUM 9.2 01/16/2020   ANIONGAP 6 04/29/2018   Lab Results  Component Value Date   CHOL 223 (H) 05/21/2016   Lab Results  Component Value Date   HDL 52 05/21/2016   Lab Results  Component Value Date   LDLCALC 147 (H) 05/21/2016   Lab Results  Component Value Date   TRIG 120 05/21/2016   Lab Results  Component Value Date   CHOLHDL 4.3 05/21/2016   No results found for: HGBA1C     Assessment & Plan:  Long term current use of anticoagulant Completed INR/PTT INR 2.8 [therapeutic range 2-3] No changes to current medication dose, patient follow-up in 6 weeks.  Essential hypertension Patient is a 86 year old female who presents to clinic for follow-up of hypertension.  Patient was diagnosed in 2018.  Patient is tolerating her medication well without side effects.  Compliance with treatment has been good including taking medication as directed.  Patient tries to maintain a healthy  diet.  Current medication is Lopressor 50 mg 1 tablet twice daily. Provided education to patient to continue maintaining a low-salt diet.  Printed handout provided to patient.  No changes to current dose.  Problem List Items Addressed This Visit      Cardiovascular and Mediastinum   Paroxysmal atrial fibrillation (HCC)   Relevant Orders   CoaguChek XS/INR Waived     Other  Long term current use of anticoagulant - Primary   Relevant Orders   CoaguChek XS/INR Waived     Description   Continue current regimen.  Return in 6 weeks for recheck     No orders of the defined types were placed in this encounter.    Ivy Lynn, NP

## 2020-03-04 NOTE — Assessment & Plan Note (Signed)
Completed INR/PTT INR 2.8 [therapeutic range 2-3] No changes to current medication dose, patient follow-up in 6 weeks.

## 2020-03-05 LAB — CUP PACEART REMOTE DEVICE CHECK
Battery Remaining Longevity: 56 mo
Battery Voltage: 2.96 V
Brady Statistic AP VP Percent: 0.13 %
Brady Statistic AP VS Percent: 76.9 %
Brady Statistic AS VP Percent: 0.02 %
Brady Statistic AS VS Percent: 22.96 %
Brady Statistic RA Percent Paced: 77.86 %
Brady Statistic RV Percent Paced: 0.15 %
Date Time Interrogation Session: 20210908015402
Implantable Lead Implant Date: 20081126
Implantable Lead Implant Date: 20081126
Implantable Lead Location: 753859
Implantable Lead Location: 753860
Implantable Lead Model: 5076
Implantable Lead Model: 5076
Implantable Pulse Generator Implant Date: 20180228
Lead Channel Impedance Value: 342 Ohm
Lead Channel Impedance Value: 361 Ohm
Lead Channel Impedance Value: 361 Ohm
Lead Channel Impedance Value: 399 Ohm
Lead Channel Pacing Threshold Amplitude: 1.125 V
Lead Channel Pacing Threshold Pulse Width: 0.4 ms
Lead Channel Sensing Intrinsic Amplitude: 0.125 mV
Lead Channel Sensing Intrinsic Amplitude: 0.125 mV
Lead Channel Sensing Intrinsic Amplitude: 8.25 mV
Lead Channel Sensing Intrinsic Amplitude: 8.25 mV
Lead Channel Setting Pacing Amplitude: 2.75 V
Lead Channel Setting Pacing Amplitude: 3.5 V
Lead Channel Setting Pacing Pulse Width: 0.4 ms
Lead Channel Setting Sensing Sensitivity: 2 mV

## 2020-03-05 NOTE — Progress Notes (Signed)
Remote pacemaker transmission.   

## 2020-03-12 ENCOUNTER — Other Ambulatory Visit: Payer: Self-pay | Admitting: Cardiovascular Disease

## 2020-04-17 ENCOUNTER — Other Ambulatory Visit: Payer: Self-pay | Admitting: Family Medicine

## 2020-04-29 ENCOUNTER — Ambulatory Visit (INDEPENDENT_AMBULATORY_CARE_PROVIDER_SITE_OTHER): Payer: MEDICARE | Admitting: Family Medicine

## 2020-04-29 ENCOUNTER — Other Ambulatory Visit: Payer: Self-pay

## 2020-04-29 ENCOUNTER — Encounter: Payer: Self-pay | Admitting: Family Medicine

## 2020-04-29 VITALS — BP 122/68 | HR 84 | Temp 97.2°F | Ht 68.0 in | Wt 157.8 lb

## 2020-04-29 DIAGNOSIS — I480 Paroxysmal atrial fibrillation: Secondary | ICD-10-CM

## 2020-04-29 DIAGNOSIS — Z9889 Other specified postprocedural states: Secondary | ICD-10-CM | POA: Diagnosis not present

## 2020-04-29 DIAGNOSIS — Z7901 Long term (current) use of anticoagulants: Secondary | ICD-10-CM

## 2020-04-29 DIAGNOSIS — Z23 Encounter for immunization: Secondary | ICD-10-CM

## 2020-04-29 LAB — COAGUCHEK XS/INR WAIVED
INR: 3 — ABNORMAL HIGH (ref 0.9–1.1)
Prothrombin Time: 35.6 s

## 2020-04-29 NOTE — Patient Instructions (Signed)
No changes to directions.  Okay to eat a bowl greens this week if you want since you are on the higher end of normal

## 2020-04-29 NOTE — Progress Notes (Signed)
Subjective: CC: INR check PCP: [name], DO [name] is a 86 y.o. female presenting to clinic today for:  1. Afib/ MV Repair Cardiologist is Dr. Sallyanne Kuster.  Has pacemaker in place.  Goal INR 2-3.  Patient last seen in September and INR was 2.8. She is currently taking Coumadin 2 tablets on Monday and Friday with 1 tablet daily all other days of the week.  No hematochezia or melena.  No reports of heart palpitations or dizziness.  ROS: Per HPI  Allergies  Allergen Reactions  . Statins Other (See Comments)    Myalgia   Past Medical History:  Diagnosis Date  . A-fib ([place])   . Dyslipidemia   . Hypothyroidism    2 degree amidarone   . Pacemaker   . PAF (paroxysmal atrial fibrillation) ([place])   . S/P mitral valve repair   . SSS (sick sinus syndrome) ([place])   . Systemic hypertension     Current Outpatient Medications:  .  levothyroxine (SYNTHROID) 25 MCG tablet, TAKE 1 TABLET DAILY, Disp: 90 tablet, Rfl: 1 .  metoprolol tartrate (LOPRESSOR) 50 MG tablet, TAKE 1 TABLET TWICE A DAY, Disp: 180 tablet, Rfl: 3 .  Polyethyl Glycol-Propyl Glycol (SYSTANE OP), Apply 1 drop to eye daily as needed (dry eyes)., Disp: , Rfl:  .  triamterene-hydrochlorothiazide (DYAZIDE) 37.5-25 MG capsule, TAKE 1 CAPSULE DAILY, Disp: 90 capsule, Rfl: 3 .  warfarin (COUMADIN) 2.5 MG tablet, TAKE 2 TABLETS ON MONDAY AND FRIDAY, THEN 1 TABLET ALL OTHER DAYS OF THE WEEK, Disp: 150 tablet, Rfl: 0 Social History   Socioeconomic History  . Marital status: Married    Spouse name: Not on file  . Number of children: 3  . Years of education: Not on file  . Highest education level: High school graduate  Occupational History  . Occupation: Retired  Tobacco Use  . Smoking status: Never Smoker  . Smokeless tobacco: Never Used  Vaping Use  . Vaping Use: Never used  Substance and Sexual Activity  . Alcohol use: No  . Drug use: Never  . Sexual activity: Not Currently  Other Topics Concern  .  Not on file  Social History Narrative  . Not on file   Social Determinants of Health   Financial Resource Strain:   . Difficulty of Paying Living Expenses: Not on file  Food Insecurity:   . Worried About Charity fundraiser in the Last Year: Not on file  . Ran Out of Food in the Last Year: Not on file  Transportation Needs:   . Lack of Transportation (Medical): Not on file  . Lack of Transportation (Non-Medical): Not on file  Physical Activity:   . Days of Exercise per Week: Not on file  . Minutes of Exercise per Session: Not on file  Stress:   . Feeling of Stress : Not on file  Social Connections: Moderately Isolated  . Frequency of Communication with Friends and Family: More than three times a week  . Frequency of Social Gatherings with Friends and Family: More than three times a week  . Attends Religious Services: Never  . Active Member of Clubs or Organizations: No  . Attends Archivist Meetings: Never  . Marital Status: Married  Human resources officer Violence:   . Fear of Current or Ex-Partner: Not on file  . Emotionally Abused: Not on file  . Physically Abused: Not on file  . Sexually Abused: Not on file   Family History  Problem  Relation Age of Onset  . Heart attack Mother   . Heart attack Father   . Parkinson's disease Son     Objective: Office vital signs reviewed. BP 122/68   Pulse 84   Temp (!) 97.2 F (36.2 C)   Ht 5\' 8"  (1.727 m)   Wt 157 lb 12.8 oz (71.6 kg)   SpO2 98%   BMI 23.99 kg/m   Physical Examination:  General: Awake, alert, well nourished, elderly female.  No acute distress HEENT: Normal, sclera white  Cardio: Regular rate and rhythm. S1S2 heard, no murmurs appreciated Pulm: clear to auscultation bilaterally, no wheezes, rhonchi or rales; normal work of breathing on room air MSK: Continues to ambulate with cane Extremities: Trace to 1+ edema noted at bilateral ankles  Assessment/ Plan: 86 y.o. female   1. Paroxysmal atrial  fibrillation (HCC) Rate and rhythm controlled today.  INR is therapeutic.  Continue current regimen.  Okay to follow-up in 6 weeks, sooner if needed - CoaguChek XS/INR Waived  2. S/P mitral valve repair - CoaguChek XS/INR Waived  3. Long term current use of anticoagulant - CoaguChek XS/INR Waived   No orders of the defined types were placed in this encounter.  No orders of the defined types were placed in this encounter.   Pneumococcal and influenza vaccinations were administered today [name], [place] [phone]

## 2020-04-29 NOTE — Addendum Note (Signed)
Addended by: Baldomero Lamy B on: 04/29/2020 09:49 AM   Modules accepted: Orders

## 2020-06-03 ENCOUNTER — Ambulatory Visit (INDEPENDENT_AMBULATORY_CARE_PROVIDER_SITE_OTHER): Payer: MEDICARE

## 2020-06-03 DIAGNOSIS — I495 Sick sinus syndrome: Secondary | ICD-10-CM

## 2020-06-03 LAB — CUP PACEART REMOTE DEVICE CHECK
Battery Remaining Longevity: 50 mo
Battery Voltage: 2.95 V
Brady Statistic AP VP Percent: 0.15 %
Brady Statistic AP VS Percent: 76.42 %
Brady Statistic AS VP Percent: 0.01 %
Brady Statistic AS VS Percent: 23.42 %
Brady Statistic RA Percent Paced: 77.45 %
Brady Statistic RV Percent Paced: 0.31 %
Date Time Interrogation Session: 20211207005822
Implantable Lead Implant Date: 20081126
Implantable Lead Implant Date: 20081126
Implantable Lead Location: 753859
Implantable Lead Location: 753860
Implantable Lead Model: 5076
Implantable Lead Model: 5076
Implantable Pulse Generator Implant Date: 20180228
Lead Channel Impedance Value: 342 Ohm
Lead Channel Impedance Value: 361 Ohm
Lead Channel Impedance Value: 380 Ohm
Lead Channel Impedance Value: 399 Ohm
Lead Channel Pacing Threshold Amplitude: 0.875 V
Lead Channel Pacing Threshold Pulse Width: 0.4 ms
Lead Channel Sensing Intrinsic Amplitude: 0.125 mV
Lead Channel Sensing Intrinsic Amplitude: 0.125 mV
Lead Channel Sensing Intrinsic Amplitude: 8.625 mV
Lead Channel Sensing Intrinsic Amplitude: 8.625 mV
Lead Channel Setting Pacing Amplitude: 2.75 V
Lead Channel Setting Pacing Amplitude: 3.5 V
Lead Channel Setting Pacing Pulse Width: 0.4 ms
Lead Channel Setting Sensing Sensitivity: 2 mV

## 2020-06-13 ENCOUNTER — Other Ambulatory Visit: Payer: Self-pay

## 2020-06-13 ENCOUNTER — Encounter: Payer: Self-pay | Admitting: Family Medicine

## 2020-06-13 ENCOUNTER — Ambulatory Visit (INDEPENDENT_AMBULATORY_CARE_PROVIDER_SITE_OTHER): Payer: MEDICARE | Admitting: Family Medicine

## 2020-06-13 VITALS — BP 108/70 | HR 80 | Temp 96.9°F | Ht 68.0 in | Wt 153.0 lb

## 2020-06-13 DIAGNOSIS — I480 Paroxysmal atrial fibrillation: Secondary | ICD-10-CM

## 2020-06-13 DIAGNOSIS — Z7901 Long term (current) use of anticoagulants: Secondary | ICD-10-CM | POA: Diagnosis not present

## 2020-06-13 DIAGNOSIS — Z9889 Other specified postprocedural states: Secondary | ICD-10-CM | POA: Diagnosis not present

## 2020-06-13 DIAGNOSIS — E039 Hypothyroidism, unspecified: Secondary | ICD-10-CM

## 2020-06-13 DIAGNOSIS — N1832 Chronic kidney disease, stage 3b: Secondary | ICD-10-CM | POA: Diagnosis not present

## 2020-06-13 DIAGNOSIS — D692 Other nonthrombocytopenic purpura: Secondary | ICD-10-CM | POA: Diagnosis not present

## 2020-06-13 LAB — COAGUCHEK XS/INR WAIVED
INR: 2.7 — ABNORMAL HIGH (ref 0.9–1.1)
Prothrombin Time: 32.4 s

## 2020-06-13 LAB — HEMOGLOBIN, FINGERSTICK: Hemoglobin: 12.8 g/dL (ref 11.1–15.9)

## 2020-06-13 NOTE — Progress Notes (Signed)
Remote pacemaker transmission.   

## 2020-06-13 NOTE — Progress Notes (Signed)
Subjective: CC:inr PCP: [name], DO [name] is a 86 y.o. female presenting to clinic today for:  1. INR recheck/A. fib Compliant with Coumadin 1 mg daily except for 2 mg on Mondays and Fridays.  No hematochezia, melena, epistaxis.  No reports of chest pain or shortness of breath.  2.  Hypothyroidism Patient is compliant with her Synthroid.  Again no reports of palpitations, change in voice, bowel habits  3.  Hypertension with CKD Patient is compliant with her triamterene-hydrochlorothiazide and Lopressor for atrial fibrillation as above.  Urine output is the same.  She does have arthritis intermittently of the knees.  ROS: Per HPI  Allergies  Allergen Reactions   Statins Other (See Comments)    Myalgia   Past Medical History:  Diagnosis Date   A-fib ([place])    Dyslipidemia    Hypothyroidism    2 degree amidarone    Pacemaker    PAF (paroxysmal atrial fibrillation) (HCC)    S/P mitral valve repair    SSS (sick sinus syndrome) (HCC)    Systemic hypertension     Current Outpatient Medications:    levothyroxine (SYNTHROID) 25 MCG tablet, TAKE 1 TABLET DAILY, Disp: 90 tablet, Rfl: 1   metoprolol tartrate (LOPRESSOR) 50 MG tablet, TAKE 1 TABLET TWICE A DAY, Disp: 180 tablet, Rfl: 3   Polyethyl Glycol-Propyl Glycol (SYSTANE OP), Apply 1 drop to eye daily as needed (dry eyes)., Disp: , Rfl:    triamterene-hydrochlorothiazide (DYAZIDE) 37.5-25 MG capsule, TAKE 1 CAPSULE DAILY, Disp: 90 capsule, Rfl: 3   warfarin (COUMADIN) 2.5 MG tablet, TAKE 2 TABLETS ON MONDAY AND FRIDAY, THEN 1 TABLET ALL OTHER DAYS OF THE WEEK, Disp: 150 tablet, Rfl: 0 Social History   Socioeconomic History   Marital status: Married    Spouse name: Not on file   Number of children: 3   Years of education: Not on file   Highest education level: High school graduate  Occupational History   Occupation: Retired  Tobacco Use   Smoking status: Never Smoker    Smokeless tobacco: Never Used  Scientific laboratory technician Use: Never used  Substance and Sexual Activity   Alcohol use: No   Drug use: Never   Sexual activity: Not Currently  Other Topics Concern   Not on file  Social History Narrative   Not on file   Social Determinants of Health   Financial Resource Strain: Not on file  Food Insecurity: Not on file  Transportation Needs: Not on file  Physical Activity: Not on file  Stress: Not on file  Social Connections: Moderately Isolated   Frequency of Communication with Friends and Family: More than three times a week   Frequency of Social Gatherings with Friends and Family: More than three times a week   Attends Religious Services: Never   Marine scientist or Organizations: No   Attends Archivist Meetings: Never   Marital Status: Married  Human resources officer Violence: Not on file   Family History  Problem Relation Age of Onset   Heart attack Mother    Heart attack Father    Parkinson's disease Son     Objective: Office vital signs reviewed. BP 108/70    Pulse 80    Temp (!) 96.9 F (36.1 C) (Temporal)    Ht '5\' 8"'  (1.727 m)    Wt 153 lb (69.4 kg)    SpO2 97%    BMI 23.26 kg/m   Physical Examination:  General: Awake, alert, well nourished, elderly female, No acute distress HEENT: Normal, sclera white, MMM; no exophthalmos Cardio: regular rate and rhythm, S1S2 heard, no murmurs appreciated Pulm: clear to auscultation bilaterally, no wheezes, rhonchi or rales; normal work of breathing on room air Extremities: warm, well perfused, trace edema, no cyanosis or clubbing; +2 pulses bilaterally MSK: antalgic gait and hunched station; using cane for ambulation Skin: Senile purpura  Assessment/ Plan: 86 y.o. female   Paroxysmal atrial fibrillation ([place]) - Plan: CoaguChek XS/INR Waived, Hemoglobin, fingerstick  S/P mitral valve repair - Plan: CoaguChek XS/INR Waived  Long term current use of anticoagulant -  Plan: CoaguChek XS/INR Waived, Hemoglobin, fingerstick  Acquired hypothyroidism - Plan: CMP14+EGFR, Thyroid Panel With TSH  Stage 3b chronic kidney disease ([place]) - Plan: CMP14+EGFR  Purpura senilis (HCC)  INR is therapeutic at 2.7.  Continue current regimen.  Hemoglobin ordered as well  Thyroid is asymptomatic.  Check thyroid panel  Check renal function given CKD 3B  Senile purpura secondary to anticoagulation as above.  Follow-up in 6 weeks, sooner if needed  No orders of the defined types were placed in this encounter.  No orders of the defined types were placed in this encounter.    [name], DO [place] [phone]

## 2020-06-14 LAB — CMP14+EGFR
ALT: 11 IU/L (ref 0–32)
AST: 20 IU/L (ref 0–40)
Albumin/Globulin Ratio: 1.9 (ref 1.2–2.2)
Albumin: 4.5 g/dL (ref 3.6–4.6)
Alkaline Phosphatase: 62 IU/L (ref 44–121)
BUN/Creatinine Ratio: 17 (ref 12–28)
BUN: 19 mg/dL (ref 8–27)
Bilirubin Total: 0.5 mg/dL (ref 0.0–1.2)
CO2: 26 mmol/L (ref 20–29)
Calcium: 9.7 mg/dL (ref 8.7–10.3)
Chloride: 100 mmol/L (ref 96–106)
Creatinine, Ser: 1.1 mg/dL — ABNORMAL HIGH (ref 0.57–1.00)
GFR calc Af Amer: 51 mL/min/{1.73_m2} — ABNORMAL LOW (ref >59)
GFR calc non Af Amer: 45 mL/min/{1.73_m2} — ABNORMAL LOW (ref >59)
Globulin, Total: 2.4 g/dL (ref 1.5–4.5)
Glucose: 90 mg/dL (ref 65–99)
Potassium: 4 mmol/L (ref 3.5–5.2)
Sodium: 142 mmol/L (ref 134–144)
Total Protein: 6.9 g/dL (ref 6.0–8.5)

## 2020-06-14 LAB — THYROID PANEL WITH TSH
Free Thyroxine Index: 1.9 (ref 1.2–4.9)
T3 Uptake Ratio: 24 % (ref 24–39)
T4, Total: 8 ug/dL (ref 4.5–12.0)
TSH: 1.7 u[IU]/mL (ref 0.450–4.500)

## 2020-06-17 ENCOUNTER — Other Ambulatory Visit: Payer: Self-pay | Admitting: Family Medicine

## 2020-06-17 MED ORDER — LEVOTHYROXINE SODIUM 25 MCG PO TABS
25.0000 ug | ORAL_TABLET | Freq: Every day | ORAL | 1 refills | Status: DC
Start: 2020-06-17 — End: 2020-10-24

## 2020-07-16 ENCOUNTER — Other Ambulatory Visit: Payer: Self-pay | Admitting: Family Medicine

## 2020-07-28 ENCOUNTER — Other Ambulatory Visit: Payer: Self-pay

## 2020-07-28 ENCOUNTER — Encounter: Payer: Self-pay | Admitting: Family Medicine

## 2020-07-28 ENCOUNTER — Ambulatory Visit (INDEPENDENT_AMBULATORY_CARE_PROVIDER_SITE_OTHER): Payer: MEDICARE | Admitting: Family Medicine

## 2020-07-28 VITALS — BP 117/64 | HR 87 | Ht 68.0 in | Wt 156.0 lb

## 2020-07-28 DIAGNOSIS — I480 Paroxysmal atrial fibrillation: Secondary | ICD-10-CM | POA: Diagnosis not present

## 2020-07-28 DIAGNOSIS — Z7901 Long term (current) use of anticoagulants: Secondary | ICD-10-CM | POA: Diagnosis not present

## 2020-07-28 DIAGNOSIS — Z9889 Other specified postprocedural states: Secondary | ICD-10-CM

## 2020-07-28 LAB — POCT INR: INR: 2.5 (ref 2–3)

## 2020-07-28 LAB — COAGUCHEK XS/INR WAIVED
INR: 2.5 — ABNORMAL HIGH (ref 0.9–1.1)
Prothrombin Time: 30.2 s

## 2020-07-28 NOTE — Progress Notes (Signed)
Subjective: CC: INR check PCP: Melissa Norlander, DO FUX:NATFT [name] is a 87 y.o. female presenting to clinic today for:  1.  Atrial fibrillation, status post mitral valve repair INR goal 2-3  INR was therapeutic at last visit.  She is here for interval checkup.  She is compliant with 2 tablets on Mondays and Fridays and 1 tablet daily all other days.  No reports of hematochezia, melena, vaginal bleeding or epistaxis.  No reports of heart palpitations or change in physical activity tolerance.   ROS: Per HPI  Allergies  Allergen Reactions  . Statins Other (See Comments)    Myalgia   Past Medical History:  Diagnosis Date  . A-fib ([place])   . Dyslipidemia   . Hypothyroidism    2 degree amidarone   . Pacemaker   . PAF (paroxysmal atrial fibrillation) ([place])   . S/P mitral valve repair   . SSS (sick sinus syndrome) ([place])   . Systemic hypertension     Current Outpatient Medications:  .  levothyroxine (SYNTHROID) 25 MCG tablet, Take 1 tablet (25 mcg total) by mouth daily., Disp: 90 tablet, Rfl: 1 .  metoprolol tartrate (LOPRESSOR) 50 MG tablet, TAKE 1 TABLET TWICE A DAY, Disp: 180 tablet, Rfl: 3 .  Polyethyl Glycol-Propyl Glycol (SYSTANE OP), Apply 1 drop to eye daily as needed (dry eyes)., Disp: , Rfl:  .  triamterene-hydrochlorothiazide (DYAZIDE) 37.5-25 MG capsule, TAKE 1 CAPSULE DAILY, Disp: 90 capsule, Rfl: 3 .  warfarin (COUMADIN) 2.5 MG tablet, TAKE 2 TABLETS ON MONDAY AND FRIDAY, THEN 1 TABLET ALL OTHER DAYS OF THE WEEK, Disp: 150 tablet, Rfl: 0 Social History   Socioeconomic History  . Marital status: Married    Spouse name: Not on file  . Number of children: 3  . Years of education: Not on file  . Highest education level: High school graduate  Occupational History  . Occupation: Retired  Tobacco Use  . Smoking status: Never Smoker  . Smokeless tobacco: Never Used  Vaping Use  . Vaping Use: Never used  Substance and Sexual Activity  . Alcohol use: No  .  Drug use: Never  . Sexual activity: Not Currently  Other Topics Concern  . Not on file  Social History Narrative  . Not on file   Social Determinants of Health   Financial Resource Strain: Not on file  Food Insecurity: Not on file  Transportation Needs: Not on file  Physical Activity: Not on file  Stress: Not on file  Social Connections: Not on file  Intimate Partner Violence: Not on file   Family History  Problem Relation Age of Onset  . Heart attack Mother   . Heart attack Father   . Parkinson's disease Son     Objective: Office vital signs reviewed. There were no vitals taken for this visit.  Physical Examination:  General: Awake, alert, frail, elderly female, No acute distress HEENT: Normal; sclera white Cardio: regular rate and rhythm, S1S2 heard, no murmurs appreciated Pulm: clear to auscultation bilaterally, no wheezes, rhonchi or rales; normal work of breathing on room air MSK: Slow, antalgic gait.  Utilizing cane for ambulation.  Valgus deformity of the knees noted bilaterally  Assessment/ Plan: 87 y.o. female   Paroxysmal atrial fibrillation (HCC) - Plan: CoaguChek XS/INR Waived, POCT INR  S/P mitral valve repair - Plan: CoaguChek XS/INR Waived, POCT INR  Long term current use of anticoagulant - Plan: CoaguChek XS/INR Waived, POCT INR  Rate and seemingly rhythm controlled.  INR is therapeutic at 2.5 today.  Continue current regimen with 2 tablets Mondays and Fridays with 1 tablet daily all other days.  May follow-up in 6 to 8 weeks, sooner if needed  No orders of the defined types were placed in this encounter.  No orders of the defined types were placed in this encounter.    Melissa Norlander, DO [place] [phone]

## 2020-09-02 ENCOUNTER — Ambulatory Visit (INDEPENDENT_AMBULATORY_CARE_PROVIDER_SITE_OTHER): Payer: MEDICARE

## 2020-09-02 DIAGNOSIS — I495 Sick sinus syndrome: Secondary | ICD-10-CM

## 2020-09-06 LAB — CUP PACEART REMOTE DEVICE CHECK
Battery Remaining Longevity: 43 mo
Battery Voltage: 2.94 V
Brady Statistic AP VP Percent: 0.31 %
Brady Statistic AP VS Percent: 81.95 %
Brady Statistic AS VP Percent: 0.02 %
Brady Statistic AS VS Percent: 17.73 %
Brady Statistic RA Percent Paced: 83.17 %
Brady Statistic RV Percent Paced: 0.33 %
Date Time Interrogation Session: 20220312004107
Implantable Lead Implant Date: 20081126
Implantable Lead Implant Date: 20081126
Implantable Lead Location: 753859
Implantable Lead Location: 753860
Implantable Lead Model: 5076
Implantable Lead Model: 5076
Implantable Pulse Generator Implant Date: 20180228
Lead Channel Impedance Value: 342 Ohm
Lead Channel Impedance Value: 380 Ohm
Lead Channel Impedance Value: 399 Ohm
Lead Channel Impedance Value: 418 Ohm
Lead Channel Pacing Threshold Amplitude: 1.125 V
Lead Channel Pacing Threshold Pulse Width: 0.4 ms
Lead Channel Sensing Intrinsic Amplitude: 0.125 mV
Lead Channel Sensing Intrinsic Amplitude: 0.125 mV
Lead Channel Sensing Intrinsic Amplitude: 8.625 mV
Lead Channel Sensing Intrinsic Amplitude: 8.625 mV
Lead Channel Setting Pacing Amplitude: 2.75 V
Lead Channel Setting Pacing Amplitude: 3.5 V
Lead Channel Setting Pacing Pulse Width: 0.4 ms
Lead Channel Setting Sensing Sensitivity: 2 mV

## 2020-09-10 ENCOUNTER — Other Ambulatory Visit: Payer: Self-pay

## 2020-09-10 ENCOUNTER — Encounter: Payer: Self-pay | Admitting: Family Medicine

## 2020-09-10 ENCOUNTER — Ambulatory Visit (INDEPENDENT_AMBULATORY_CARE_PROVIDER_SITE_OTHER): Payer: MEDICARE | Admitting: Family Medicine

## 2020-09-10 VITALS — BP 117/66 | HR 67 | Temp 97.6°F | Ht 67.0 in | Wt 155.0 lb

## 2020-09-10 DIAGNOSIS — Z9889 Other specified postprocedural states: Secondary | ICD-10-CM | POA: Diagnosis not present

## 2020-09-10 DIAGNOSIS — I480 Paroxysmal atrial fibrillation: Secondary | ICD-10-CM

## 2020-09-10 DIAGNOSIS — Z7901 Long term (current) use of anticoagulants: Secondary | ICD-10-CM

## 2020-09-10 LAB — COAGUCHEK XS/INR WAIVED
INR: 2.8 — ABNORMAL HIGH (ref 0.9–1.1)
Prothrombin Time: 33.8 s

## 2020-09-10 NOTE — Progress Notes (Signed)
Subjective: CC: INR check PCP: Melissa Norlander, DO WUJ:WJXBJ [name] is a 87 y.o. female presenting to clinic today for:  1. INR check for afib/ MVR Patient here for interval INR check.  She was therapeutic at last visit in January.  Her Coumadin regimen is 2 tablets on Mondays and Fridays with 1 tablet daily all other days.  Goal INR is 2-3.  Denies any hematochezia, melena.  She reports some easy bleeding when her cat scratches her.  Cat To be neutered soon because he is aggressive   ROS: Per HPI  Allergies  Allergen Reactions  . Statins Other (See Comments)    Myalgia   Past Medical History:  Diagnosis Date  . A-fib ([place])   . Dyslipidemia   . Hypothyroidism    2 degree amidarone   . Pacemaker   . PAF (paroxysmal atrial fibrillation) ([place])   . S/P mitral valve repair   . SSS (sick sinus syndrome) ([place])   . Systemic hypertension     Current Outpatient Medications:  .  levothyroxine (SYNTHROID) 25 MCG tablet, Take 1 tablet (25 mcg total) by mouth daily., Disp: 90 tablet, Rfl: 1 .  metoprolol tartrate (LOPRESSOR) 50 MG tablet, TAKE 1 TABLET TWICE A DAY, Disp: 180 tablet, Rfl: 3 .  Polyethyl Glycol-Propyl Glycol (SYSTANE OP), Apply 1 drop to eye daily as needed (dry eyes)., Disp: , Rfl:  .  triamterene-hydrochlorothiazide (DYAZIDE) 37.5-25 MG capsule, TAKE 1 CAPSULE DAILY, Disp: 90 capsule, Rfl: 3 .  warfarin (COUMADIN) 2.5 MG tablet, TAKE 2 TABLETS ON MONDAY AND FRIDAY, THEN 1 TABLET ALL OTHER DAYS OF THE WEEK, Disp: 150 tablet, Rfl: 0 Social History   Socioeconomic History  . Marital status: Married    Spouse name: Not on file  . Number of children: 3  . Years of education: Not on file  . Highest education level: High school graduate  Occupational History  . Occupation: Retired  Tobacco Use  . Smoking status: Never Smoker  . Smokeless tobacco: Never Used  Vaping Use  . Vaping Use: Never used  Substance and Sexual Activity  . Alcohol use: No  . Drug use:  Never  . Sexual activity: Not Currently  Other Topics Concern  . Not on file  Social History Narrative  . Not on file   Social Determinants of Health   Financial Resource Strain: Not on file  Food Insecurity: Not on file  Transportation Needs: Not on file  Physical Activity: Not on file  Stress: Not on file  Social Connections: Not on file  Intimate Partner Violence: Not on file   Family History  Problem Relation Age of Onset  . Heart attack Mother   . Heart attack Father   . Parkinson's disease Son     Objective: Office vital signs reviewed. BP 117/66   Pulse 67   Temp 97.6 F (36.4 C) (Temporal)   Ht 5\' 7"  (1.702 m)   Wt 155 lb (70.3 kg)   SpO2 98%   BMI 24.28 kg/m   Physical Examination:  General: Awake, alert, elderly, frail-appearing female, No acute distress HEENT: Normal; sclera white Cardio: Irregularly irregular.  S1S2 heard, no murmurs appreciated Pulm: clear to auscultation bilaterally, no wheezes, rhonchi or rales; normal work of breathing on room air   Assessment/ Plan: 87 y.o. female   Paroxysmal atrial fibrillation ([place]) - Plan: CoaguChek XS/INR Waived  S/P mitral valve repair - Plan: CoaguChek XS/INR Waived  Long term current use of anticoagulant  INR is therapeutic at 2.8 today.  May follow-up in 6 to 8 weeks.  Continue current regimen  Orders Placed This Encounter  Procedures  . CoaguChek XS/INR Waived   No orders of the defined types were placed in this encounter.    Melissa Norlander, DO [place] [phone]

## 2020-09-10 NOTE — Progress Notes (Signed)
Remote pacemaker transmission.   

## 2020-09-11 ENCOUNTER — Ambulatory Visit (INDEPENDENT_AMBULATORY_CARE_PROVIDER_SITE_OTHER): Payer: MEDICARE | Admitting: *Deleted

## 2020-09-11 DIAGNOSIS — Z0000 Encounter for general adult medical examination without abnormal findings: Secondary | ICD-10-CM

## 2020-09-11 NOTE — Progress Notes (Addendum)
MEDICARE ANNUAL WELLNESS VISIT  09/11/2020  Telephone Visit Disclaimer This Medicare AWV was conducted by telephone due to national recommendations for restrictions regarding the COVID-19 Pandemic (e.g. social distancing).  I verified, using two identifiers, that I am speaking with [name] or their authorized healthcare agent. I discussed the limitations, risks, security, and privacy concerns of performing an evaluation and management service by telephone and the potential availability of an in-person appointment in the future. The patient expressed understanding and agreed to proceed.  Location of Patient: Home Location of Provider (nurse):  [facility]  Subjective:    [name] is a 87 y.o. female patient of Melissa Norlander, DO who had a Medicare Annual Wellness Visit today via telephone. Latesa is Retired and lives with their spouse. she has 3 children. she reports that she is socially active and does interact with friends/family regularly. she is minimally physically active and enjoys gardening.  Patient Care Team: Melissa Norlander, DO as PCP - General (Family Medicine) Melissa Dy, MD (Internal Medicine) Melissa China, RN as Registered Nurse  Advanced Directives 09/11/2020 01/17/2019 04/29/2018 08/25/2016  Does Patient Have a Medical Advance Directive? No No No No  Would patient like information on creating a medical advance directive? No - Patient declined No - Patient declined - No - Patient declined    Hospital Utilization Over the Past 12 Months: # of hospitalizations or ER visits: 0 # of surgeries: 0  Review of Systems    Patient reports that her overall health is unchanged compared to last year.  History obtained from chart review and the patient  Patient Reported Readings (BP, Pulse, CBG, Weight, etc) none  Pain Assessment Pain : No/denies pain     Current Medications & Allergies (verified) Allergies as of  09/11/2020      Reactions   Statins Other (See Comments)   Myalgia      Medication List       Accurate as of September 11, 2020  3:59 PM. If you have any questions, ask your nurse or doctor.        levothyroxine 25 MCG tablet Commonly known as: SYNTHROID Take 1 tablet (25 mcg total) by mouth daily.   metoprolol tartrate 50 MG tablet Commonly known as: LOPRESSOR TAKE 1 TABLET TWICE A DAY   SYSTANE OP Apply 1 drop to eye daily as needed (dry eyes).   triamterene-hydrochlorothiazide 37.5-25 MG capsule Commonly known as: DYAZIDE TAKE 1 CAPSULE DAILY   warfarin 2.5 MG tablet Commonly known as: COUMADIN Take as directed by the anticoagulation clinic. If you are unsure how to take this medication, talk to your nurse or doctor. Original instructions: TAKE 2 TABLETS ON MONDAY AND FRIDAY, THEN 1 TABLET ALL OTHER DAYS OF THE WEEK       History (reviewed): Past Medical History:  Diagnosis Date  . A-fib ([place])   . Dyslipidemia   . Hyperlipidemia   . Hypothyroidism    2 degree amidarone   . Pacemaker   . PAF (paroxysmal atrial fibrillation) ([place])   . S/P mitral valve repair   . SSS (sick sinus syndrome) ([place])   . Systemic hypertension    Past Surgical History:  Procedure Laterality Date  . ABDOMINAL HYSTERECTOMY  1989  . CARDIAC CATHETERIZATION  01/11/2008   mild-mod pulm. hypertension,normal coronaries  . MINIMALLY INVASIVE MAZE PROCEDURE  02/2008  . MITRAL VALVE REPAIR  September 2009   [facility] ring annuloplasty  .  NM MYOVIEW LTD  03/28/2007   no ischemia  . PACEMAKER PLACEMENT  November 2008  . PPM GENERATOR CHANGEOUT N/A 08/25/2016   Procedure: PPM Generator Changeout - Battery Only;  Surgeon: Sanda Klein, MD;  Location: [facility];  Service: Cardiovascular;  Laterality: N/A;  . US ECHOCARDIOGRAPHY  03/07/2012   mild LVH,EF =>55%,mild MR,mod TR,trace AI,right atrium mod. dilated   Family History  Problem Relation Age of Onset  . Heart attack Mother    . Heart attack Father   . Parkinson's disease Son    Social History   Socioeconomic History  . Marital status: Married    Spouse name: Not on file  . Number of children: 3  . Years of education: Not on file  . Highest education level: High school graduate  Occupational History  . Occupation: Retired  Tobacco Use  . Smoking status: Never Smoker  . Smokeless tobacco: Never Used  Vaping Use  . Vaping Use: Never used  Substance and Sexual Activity  . Alcohol use: No  . Drug use: Never  . Sexual activity: Not Currently  Other Topics Concern  . Not on file  Social History Narrative  . Not on file   Social Determinants of Health   Financial Resource Strain: Not on file  Food Insecurity: Not on file  Transportation Needs: Not on file  Physical Activity: Not on file  Stress: Not on file  Social Connections: Not on file    Activities of Daily Living In your present state of health, do you have any difficulty performing the following activities: 09/11/2020  Hearing? N  Vision? N  Comment Wears glasses  Difficulty concentrating or making decisions? N  Walking or climbing stairs? Y  Comment At times due to neuropathy  Dressing or bathing? N  Doing errands, shopping? Y  Comment Son takes her to grocery store and doctor visits  Preparing Food and eating ? N  Using the Toilet? N  In the past six months, have you accidently leaked urine? N  Do you have problems with loss of bowel control? N  Managing your Medications? N  Managing your Finances? N  Housekeeping or managing your Housekeeping? N  Some recent data might be hidden    Patient Education/ Literacy How often do you need to have someone help you when you read instructions, pamphlets, or other written materials from your doctor or pharmacy?: 1 - Never  Exercise Current Exercise Habits: The patient does not participate in regular exercise at present  Diet Patient reports consuming 3 meals a day and 1 snack(s) a  day Patient reports that her primary diet is: Regular Patient reports that she does have regular access to food.   Depression Screen PHQ 2/9 Scores 09/11/2020 09/10/2020 07/28/2020 07/28/2020 06/13/2020 04/29/2020 03/04/2020  PHQ - 2 Score 0 0 0 0 0 0 0  PHQ- 9 Score - 0 0 - 0 - -     Fall Risk Fall Risk  09/11/2020 09/10/2020 07/28/2020 06/13/2020 04/29/2020  Falls in the past year? 0 0 0 0 0  Number falls in past yr: - - - - -  Injury with Fall? - - - - -  Risk for fall due to : - - - - -  Follow up - - - - -     Objective:  W[place] seemed alert and oriented and she participated appropriately during our telephone visit.  Blood Pressure Weight BMI  BP Readings from Last 3 Encounters:  09/10/20 117/66  07/28/20 117/64  06/13/20 108/70   Wt Readings from Last 3 Encounters:  09/10/20 155 lb (70.3 kg)  07/28/20 156 lb (70.8 kg)  06/13/20 153 lb (69.4 kg)   BMI Readings from Last 1 Encounters:  09/10/20 24.28 kg/m    *Unable to obtain current vital signs, weight, and BMI due to telephone visit type  Hearing/Vision  . Ilona did not seem to have difficulty with hearing/understanding during the telephone conversation . Reports that she has had a formal eye exam by an eye care professional within the past year . Reports that she has not had a formal hearing evaluation within the past year *Unable to fully assess hearing and vision during telephone visit type  Cognitive Function: 6CIT Screen 09/11/2020 01/17/2019  What Year? 0 points 0 points  What month? 0 points 0 points  What time? 0 points 0 points  Count back from 20 2 points 0 points  Months in reverse 0 points 0 points  Repeat phrase 0 points 0 points  Total Score 2 0   (Normal:0-7, Significant for Dysfunction: >8)  Normal Cognitive Function Screening: Yes   Immunization & Health Maintenance Record Immunization History  Administered Date(s) Administered  . Fluad Quad(high Dose 65+) 03/14/2019, 04/29/2020  .  Influenza, High Dose Seasonal PF 03/26/2016, 04/01/2017, 03/29/2018  . Influenza,inj,Quad PF,6+ Mos 04/24/2013, 05/01/2014, 04/08/2015  . Moderna Sars-Covid-2 Vaccination 09/11/2019, 10/08/2019  . Pneumococcal Conjugate-13 04/29/2020    Health Maintenance  Topic Date Due  . COVID-19 Vaccine (3 - Booster for Moderna series) 12/15/2020 (Originally 04/08/2020)  . TETANUS/TDAP  07/28/2021 (Originally 05/28/1948)  . PNA vac Low Risk Adult (2 of 2 - PPSV23) 04/29/2021  . INFLUENZA VACCINE  Completed  . DEXA SCAN  Completed  . HPV VACCINES  Aged Out       Assessment  This is a routine wellness examination for [employer].  Health Maintenance: Due or Overdue There are no preventive care reminders to display for this patient.  [name] does not need a referral for Community Assistance: Care Management:   no Social Work:    no Prescription Assistance:  no Nutrition/Diabetes Education:  no   Plan:  Personalized Goals Goals Addressed            This Visit's Progress   . AWV       09/11/2020 AWV Goal: Exercise for General Health   Patient will verbalize understanding of the benefits of increased physical activity:  Exercising regularly is important. It will improve your overall fitness, flexibility, and endurance.  Regular exercise also will improve your overall health. It can help you control your weight, reduce stress, and improve your bone density.  Over the next year, patient will increase physical activity as tolerated with a goal of at least 150 minutes of moderate physical activity per week.   You can tell that you are exercising at a moderate intensity if your heart starts beating faster and you start breathing faster but can still hold a conversation.  Moderate-intensity exercise ideas include:  Walking 1 mile (1.6 km) in about 15 minutes  Biking  Hiking  Golfing  Dancing  Water aerobics  Patient will verbalize understanding of everyday  activities that increase physical activity by providing examples like the following: ? Yard work, such as: ? Pushing a Conservation officer, nature ? Raking and bagging leaves ? Washing your car ? Pushing a stroller ? Shoveling snow ? Gardening ? Washing windows or floors  Patient will  be able to explain general safety guidelines for exercising:   Before you start a new exercise program, talk with your health care provider.  Do not exercise so much that you hurt yourself, feel dizzy, or get very short of breath.  Wear comfortable clothes and wear shoes with good support.  Drink plenty of water while you exercise to prevent dehydration or heat stroke.  Work out until your breathing and your heartbeat get faster.       Personalized Health Maintenance & Screening Recommendations  Td vaccine COVID vaccine booster  Lung Cancer Screening Recommended: no (Low Dose CT Chest recommended if Age 44-80 years, 30 pack-year currently smoking OR have quit w/in past 15 years) Hepatitis C Screening recommended: no HIV Screening recommended: no  Advanced Directives: Written information was not prepared per patient's request.  Referrals & Orders No orders of the defined types were placed in this encounter.   Follow-up Plan . Follow-up with Melissa Norlander, DO as planned . AVS printed and mailed to patient   I have personally reviewed and noted the following in the patient's chart:   . Medical and social history . Use of alcohol, tobacco or illicit drugs  . Current medications and supplements . Functional ability and status . Nutritional status . Physical activity . Advanced directives . List of other physicians . Hospitalizations, surgeries, and ER visits in previous 12 months . Vitals . Screenings to include cognitive, depression, and falls . Referrals and appointments  In addition, I have reviewed and discussed with [name] certain preventive protocols, quality metrics, and best  practice recommendations. A written personalized care plan for preventive services as well as general preventive health recommendations is available and can be mailed to the patient at her request.      Lynnea Ferrier, LPN  [id number]

## 2020-09-11 NOTE — Patient Instructions (Addendum)
  [place] Maintenance Summary and Written Plan of Care  Ms. Gombos ,  Thank you for allowing me to perform your Medicare Annual Wellness Visit and for your ongoing commitment to your health.   Health Maintenance & Immunization History Health Maintenance  Topic Date Due  . COVID-19 Vaccine (3 - Booster for Moderna series) 12/15/2020 (Originally 04/08/2020)  . TETANUS/TDAP  07/28/2021 (Originally 05/28/1948)  . PNA vac Low Risk Adult (2 of 2 - PPSV23) 04/29/2021  . INFLUENZA VACCINE  Completed  . DEXA SCAN  Completed  . HPV VACCINES  Aged Out   Immunization History  Administered Date(s) Administered  . Fluad Quad(high Dose 65+) 03/14/2019, 04/29/2020  . Influenza, High Dose Seasonal PF 03/26/2016, 04/01/2017, 03/29/2018  . Influenza,inj,Quad PF,6+ Mos 04/24/2013, 05/01/2014, 04/08/2015  . Moderna Sars-Covid-2 Vaccination 09/11/2019, 10/08/2019  . Pneumococcal Conjugate-13 04/29/2020    These are the patient goals that we discussed: Goals Addressed            This Visit's Progress   . AWV       09/11/2020 AWV Goal: Exercise for General Health   Patient will verbalize understanding of the benefits of increased physical activity:  Exercising regularly is important. It will improve your overall fitness, flexibility, and endurance.  Regular exercise also will improve your overall health. It can help you control your weight, reduce stress, and improve your bone density.  Over the next year, patient will increase physical activity as tolerated with a goal of at least 150 minutes of moderate physical activity per week.   You can tell that you are exercising at a moderate intensity if your heart starts beating faster and you start breathing faster but can still hold a conversation.  Moderate-intensity exercise ideas include:  Walking 1 mile (1.6 km) in about 15 minutes  Biking  Hiking  Golfing  Dancing  Water aerobics  Patient will  verbalize understanding of everyday activities that increase physical activity by providing examples like the following: ? Yard work, such as: ? Pushing a Conservation officer, nature ? Raking and bagging leaves ? Washing your car ? Pushing a stroller ? Shoveling snow ? Gardening ? Washing windows or floors  Patient will be able to explain general safety guidelines for exercising:   Before you start a new exercise program, talk with your health care provider.  Do not exercise so much that you hurt yourself, feel dizzy, or get very short of breath.  Wear comfortable clothes and wear shoes with good support.  Drink plenty of water while you exercise to prevent dehydration or heat stroke.  Work out until your breathing and your heartbeat get faster.         This is a list of Health Maintenance Items that are overdue or due now: There are no preventive care reminders to display for this patient.   Orders/Referrals Placed Today: No orders of the defined types were placed in this encounter.  (Contact our referral department at [phone] if you have not spoken with someone about your referral appointment within the next 5 days)    Follow-up Plan Follow-up with [name], DO as planned

## 2020-10-14 ENCOUNTER — Other Ambulatory Visit: Payer: Self-pay | Admitting: Family Medicine

## 2020-10-22 ENCOUNTER — Encounter: Payer: Self-pay | Admitting: Family Medicine

## 2020-10-22 ENCOUNTER — Ambulatory Visit: Payer: MEDICARE | Admitting: Family Medicine

## 2020-10-22 NOTE — Progress Notes (Deleted)
Subjective: CC:*** PCP: [name], DO MOQ:HUTML H Ware is a 87 y.o. female presenting to clinic today for:  1. ***   ROS: Per HPI  Allergies  Allergen Reactions  . Statins Other (See Comments)    Myalgia   Past Medical History:  Diagnosis Date  . A-fib ([place])   . Dyslipidemia   . Hyperlipidemia   . Hypothyroidism    2 degree amidarone   . Pacemaker   . PAF (paroxysmal atrial fibrillation) ([place])   . S/P mitral valve repair   . SSS (sick sinus syndrome) ([place])   . Systemic hypertension     Current Outpatient Medications:  .  levothyroxine (SYNTHROID) 25 MCG tablet, Take 1 tablet (25 mcg total) by mouth daily., Disp: 90 tablet, Rfl: 1 .  metoprolol tartrate (LOPRESSOR) 50 MG tablet, TAKE 1 TABLET TWICE A DAY, Disp: 180 tablet, Rfl: 3 .  Polyethyl Glycol-Propyl Glycol (SYSTANE OP), Apply 1 drop to eye daily as needed (dry eyes)., Disp: , Rfl:  .  triamterene-hydrochlorothiazide (DYAZIDE) 37.5-25 MG capsule, TAKE 1 CAPSULE DAILY, Disp: 90 capsule, Rfl: 3 .  warfarin (COUMADIN) 2.5 MG tablet, TAKE 2 TABLETS ON MONDAY AND FRIDAY, THEN 1 TABLET ALL OTHER DAYS OF THE WEEK, Disp: 150 tablet, Rfl: 0 Social History   Socioeconomic History  . Marital status: Married    Spouse name: Not on file  . Number of children: 3  . Years of education: Not on file  . Highest education level: High school graduate  Occupational History  . Occupation: Retired  Tobacco Use  . Smoking status: Never Smoker  . Smokeless tobacco: Never Used  Vaping Use  . Vaping Use: Never used  Substance and Sexual Activity  . Alcohol use: No  . Drug use: Never  . Sexual activity: Not Currently  Other Topics Concern  . Not on file  Social History Narrative  . Not on file   Social Determinants of Health   Financial Resource Strain: Not on file  Food Insecurity: Not on file  Transportation Needs: Not on file  Physical Activity: Not on file  Stress: Not on file  Social Connections: Not  on file  Intimate Partner Violence: Not on file   Family History  Problem Relation Age of Onset  . Heart attack Mother   . Heart attack Father   . Parkinson's disease Son     Objective: Office vital signs reviewed. There were no vitals taken for this visit.  Physical Examination:  General: Awake, alert, *** nourished, No acute distress HEENT: Normal    Neck: No masses palpated. No lymphadenopathy    Ears: Tympanic membranes intact, normal light reflex, no erythema, no bulging    Eyes: PERRLA, extraocular membranes intact, sclera ***    Nose: nasal turbinates moist, *** nasal discharge    Throat: moist mucus membranes, no erythema, *** tonsillar exudate.  Airway is patent Cardio: regular rate and rhythm, S1S2 heard, no murmurs appreciated Pulm: clear to auscultation bilaterally, no wheezes, rhonchi or rales; normal work of breathing on room air GI: soft, non-tender, non-distended, bowel sounds present x4, no hepatomegaly, no splenomegaly, no masses GU: external vaginal tissue ***, cervix ***, *** punctate lesions on cervix appreciated, *** discharge from cervical os, *** bleeding, *** cervical motion tenderness, *** abdominal/ adnexal masses Extremities: warm, well perfused, No edema, cyanosis or clubbing; +*** pulses bilaterally MSK: *** gait and *** station Skin: dry; intact; no rashes or lesions Neuro: *** Strength and light touch sensation grossly  intact, *** DTRs ***/4  Assessment/ Plan: 87 y.o. female   ***  No orders of the defined types were placed in this encounter.  No orders of the defined types were placed in this encounter.    [name], DO [place] [phone]

## 2020-10-23 ENCOUNTER — Encounter: Payer: Self-pay | Admitting: Family Medicine

## 2020-10-23 ENCOUNTER — Ambulatory Visit (INDEPENDENT_AMBULATORY_CARE_PROVIDER_SITE_OTHER): Payer: MEDICARE | Admitting: Family Medicine

## 2020-10-23 ENCOUNTER — Other Ambulatory Visit: Payer: Self-pay

## 2020-10-23 VITALS — BP 109/60 | HR 77 | Temp 97.1°F | Ht 67.0 in | Wt 152.4 lb

## 2020-10-23 DIAGNOSIS — I480 Paroxysmal atrial fibrillation: Secondary | ICD-10-CM

## 2020-10-23 DIAGNOSIS — N1832 Chronic kidney disease, stage 3b: Secondary | ICD-10-CM | POA: Diagnosis not present

## 2020-10-23 DIAGNOSIS — Z9889 Other specified postprocedural states: Secondary | ICD-10-CM | POA: Diagnosis not present

## 2020-10-23 DIAGNOSIS — E039 Hypothyroidism, unspecified: Secondary | ICD-10-CM

## 2020-10-23 LAB — COAGUCHEK XS/INR WAIVED
INR: 2.4 — ABNORMAL HIGH (ref 0.9–1.1)
Prothrombin Time: 28.2 s

## 2020-10-23 LAB — HEMOGLOBIN, FINGERSTICK: Hemoglobin: 12.3 g/dL (ref 11.1–15.9)

## 2020-10-23 LAB — POCT INR: INR: 2.4 (ref 2–3)

## 2020-10-23 NOTE — Progress Notes (Signed)
Subjective: CC: INR check, thyroid disorder PCP: Melissa Norlander, DO [id number]:[id number] [name] is a 87 y.o. female presenting to clinic today for:  1.  A. fib status post mitral valve repair Patient is anticoagulated with an INR goal of 2-3.  She was determined to be therapeutic at her last visit in March.  She continues to take 2 tablets on Mondays and Fridays with 1 tablet daily all other days.  No hematochezia, melena.     2.  Hypothyroidism Compliant with Synthroid 25 mcg.  No tremor, heart palpitations.     ROS: Per HPI  Allergies  Allergen Reactions  . Statins Other (See Comments)    Myalgia   Past Medical History:  Diagnosis Date  . A-fib ([place])   . Dyslipidemia   . Hyperlipidemia   . Hypothyroidism    2 degree amidarone   . Pacemaker   . PAF (paroxysmal atrial fibrillation) ([place])   . S/P mitral valve repair   . SSS (sick sinus syndrome) ([place])   . Systemic hypertension     Current Outpatient Medications:  .  levothyroxine (SYNTHROID) 25 MCG tablet, Take 1 tablet (25 mcg total) by mouth daily., Disp: 90 tablet, Rfl: 1 .  metoprolol tartrate (LOPRESSOR) 50 MG tablet, TAKE 1 TABLET TWICE A DAY, Disp: 180 tablet, Rfl: 3 .  Polyethyl Glycol-Propyl Glycol (SYSTANE OP), Apply 1 drop to eye daily as needed (dry eyes)., Disp: , Rfl:  .  triamterene-hydrochlorothiazide (DYAZIDE) 37.5-25 MG capsule, TAKE 1 CAPSULE DAILY, Disp: 90 capsule, Rfl: 3 .  warfarin (COUMADIN) 2.5 MG tablet, TAKE 2 TABLETS ON MONDAY AND FRIDAY, THEN 1 TABLET ALL OTHER DAYS OF THE WEEK, Disp: 150 tablet, Rfl: 0 Social History   Socioeconomic History  . Marital status: Married    Spouse name: Not on file  . Number of children: 3  . Years of education: Not on file  . Highest education level: High school graduate  Occupational History  . Occupation: Retired  Tobacco Use  . Smoking status: Never Smoker  . Smokeless tobacco: Never Used  Vaping Use  . Vaping Use: Never used  Substance and Sexual  Activity  . Alcohol use: No  . Drug use: Never  . Sexual activity: Not Currently  Other Topics Concern  . Not on file  Social History Narrative  . Not on file   Social Determinants of Health   Financial Resource Strain: Not on file  Food Insecurity: Not on file  Transportation Needs: Not on file  Physical Activity: Not on file  Stress: Not on file  Social Connections: Not on file  Intimate Partner Violence: Not on file   Family History  Problem Relation Age of Onset  . Heart attack Mother   . Heart attack Father   . Parkinson's disease Son     Objective: Office vital signs reviewed. BP 109/60   Pulse 77   Temp (!) 97.1 F (36.2 C)   Ht 5\' 7"  (1.702 Melissa)   Wt 152 lb 6.4 oz (69.1 kg)   SpO2 100%   BMI 23.87 kg/Melissa   Physical Examination:  General: Awake, alert, well nourished, No acute distress Cardio: regular rate and rhythm, S1S2 heard, no murmurs appreciated Pulm: clear to auscultation bilaterally, no wheezes, rhonchi or rales; normal work of breathing on room air MSK: slow, antalgic gait and valgus deformity of knees; needs assistance for ambulation  Assessment/ Plan: 87 y.o. female   Paroxysmal atrial fibrillation ([place]) - Plan: CoaguChek XS/INR Waived,  POCT INR  S/P mitral valve repair - Plan: CoaguChek XS/INR Waived, POCT INR  Acquired hypothyroidism - Plan: TSH, T4, Free, POCT INR  Stage 3b chronic kidney disease ([place]) - Plan: Renal Function Panel, Hemoglobin, fingerstick, POCT INR  INR therapeutic.  Continue current regimen.  Follow-up visit scheduled.  Asymptomatic from a thyroid standpoint.  Continue current regimen  Check renal function along with above levels    No orders of the defined types were placed in this encounter.  No orders of the defined types were placed in this encounter.    Melissa Norlander, DO [place] [phone]

## 2020-10-24 ENCOUNTER — Other Ambulatory Visit: Payer: Self-pay | Admitting: Family Medicine

## 2020-10-24 LAB — RENAL FUNCTION PANEL
Albumin: 4 g/dL (ref 3.6–4.6)
BUN/Creatinine Ratio: 18 (ref 12–28)
BUN: 19 mg/dL (ref 8–27)
CO2: 27 mmol/L (ref 20–29)
Calcium: 9.3 mg/dL (ref 8.7–10.3)
Chloride: 101 mmol/L (ref 96–106)
Creatinine, Ser: 1.07 mg/dL — ABNORMAL HIGH (ref 0.57–1.00)
Glucose: 162 mg/dL — ABNORMAL HIGH (ref 65–99)
Phosphorus: 2.7 mg/dL — ABNORMAL LOW (ref 3.0–4.3)
Potassium: 3.9 mmol/L (ref 3.5–5.2)
Sodium: 140 mmol/L (ref 134–144)
eGFR: 50 mL/min/{1.73_m2} — ABNORMAL LOW (ref >59)

## 2020-10-24 LAB — T4, FREE: Free T4: 1.11 ng/dL (ref 0.82–1.77)

## 2020-10-24 LAB — TSH: TSH: 1.78 u[IU]/mL (ref 0.450–4.500)

## 2020-10-24 MED ORDER — LEVOTHYROXINE SODIUM 25 MCG PO TABS: 25.0000 ug | ORAL_TABLET | Freq: Every day | ORAL | 1 refills | Status: DC

## 2020-10-27 ENCOUNTER — Encounter: Payer: Self-pay | Admitting: Cardiovascular Disease

## 2020-10-27 ENCOUNTER — Other Ambulatory Visit: Payer: Self-pay

## 2020-10-27 ENCOUNTER — Ambulatory Visit (INDEPENDENT_AMBULATORY_CARE_PROVIDER_SITE_OTHER): Payer: MEDICARE | Admitting: Cardiovascular Disease

## 2020-10-27 VITALS — BP 140/78 | HR 121 | Ht 67.0 in | Wt 152.0 lb

## 2020-10-27 DIAGNOSIS — Z9889 Other specified postprocedural states: Secondary | ICD-10-CM

## 2020-10-27 DIAGNOSIS — I480 Paroxysmal atrial fibrillation: Secondary | ICD-10-CM | POA: Diagnosis not present

## 2020-10-27 DIAGNOSIS — Z7901 Long term (current) use of anticoagulants: Secondary | ICD-10-CM

## 2020-10-27 DIAGNOSIS — I10 Essential (primary) hypertension: Secondary | ICD-10-CM

## 2020-10-27 DIAGNOSIS — I495 Sick sinus syndrome: Secondary | ICD-10-CM | POA: Diagnosis not present

## 2020-10-27 DIAGNOSIS — Z950 Presence of cardiac pacemaker: Secondary | ICD-10-CM | POA: Diagnosis not present

## 2020-10-27 NOTE — Progress Notes (Signed)
Cardiology office note   Date:  10/27/2020   ID:  [name], DOB 06-13-1929, MRN [id number] PCP:  [name], DO  Cardiologist:  Jeani Fassnacht Electrophysiologist:  None   Evaluation Performed:  Follow-Up Visit  Chief Complaint:  Afib, pacemaker follow-up  History of Present Illness:    [name] is a 87 y.o. female with remote history of mitral valve annuloplasty and surgical ablation, sinus node dysfunction and paroxysmal atrial fibrillation with a dual-chamber permanent pacemaker (Medtronic 2008, original 5076 leads, Azure generator change out 2018).  She has not had any personal health challenges, but many of her family members including her husband have had serious health issues.  He has had 3-4 surgeries in the last year and most recently had a GI bleed related to NSAID use.  She is not aware of any palpitations.  She denies any issues with dizziness or syncope.  She has mild ankle swelling and varicose veins bilaterally.  She denies exertional angina or dyspnea and does not have orthopnea, PND or any focal neurological events.  At her last appointment we adjusted the sensitivity on the unipolar atrial lead and there is a drastic reduction in the report of atrial fibrillation.  She was having a lot of issues with over sensing.  There is 1 distinct episode of atrial fibrillation lasting for about 4 hours and a handful of other short nonsustained events.  Otherwise she has 80% atrial pacing and 0.3% ventricular pacing.  The overall burden of atrial fibrillation is less than 0.1%.  Estimated generator longevity is 3.3 years.  Atrial lead pacing threshold was manually tested today and is excellent.  Bipolar sensing is also mediocre with P waves of 0.3-0.6 mV, but there is less interference from myopotentials.  Atrial sensing was reprogrammed to bipolar vector with a sensitivity of 0.1 mV.  She has hypertension. She has a history of mitral valve annuloplasty and Cox Maze  cryoablation procedure via minithoracotomy in 2009, has preserved left ventricular systolic function, no residual mitral insufficiency, normal coronary arteries at preoperative angiogram.   Past Medical History:  Diagnosis Date  . A-fib ([place])   . Dyslipidemia   . Hyperlipidemia   . Hypothyroidism    2 degree amidarone   . Pacemaker   . PAF (paroxysmal atrial fibrillation) ([place])   . S/P mitral valve repair   . SSS (sick sinus syndrome) ([place])   . Systemic hypertension    Past Surgical History:  Procedure Laterality Date  . ABDOMINAL HYSTERECTOMY  1989  . CARDIAC CATHETERIZATION  01/11/2008   mild-mod pulm. hypertension,normal coronaries  . MINIMALLY INVASIVE MAZE PROCEDURE  02/2008  . MITRAL VALVE REPAIR  September 2009   [facility] ring annuloplasty  . NM MYOVIEW LTD  03/28/2007   no ischemia  . PACEMAKER PLACEMENT  November 2008  . PPM GENERATOR CHANGEOUT N/A 08/25/2016   Procedure: PPM Generator Changeout - Battery Only;  Surgeon: Sanda Klein, MD;  Location: [facility];  Service: Cardiovascular;  Laterality: N/A;  . US ECHOCARDIOGRAPHY  03/07/2012   mild LVH,EF =>55%,mild MR,mod TR,trace AI,right atrium mod. dilated     Current Meds  Medication Sig  . levothyroxine (SYNTHROID) 25 MCG tablet Take 1 tablet (25 mcg total) by mouth daily.  . metoprolol tartrate (LOPRESSOR) 50 MG tablet TAKE 1 TABLET TWICE A DAY  . Polyethyl Glycol-Propyl Glycol (SYSTANE OP) Apply 1 drop to eye daily as needed (dry eyes).  . triamterene-hydrochlorothiazide (DYAZIDE) 37.5-25 MG capsule TAKE 1  CAPSULE DAILY  . warfarin (COUMADIN) 2.5 MG tablet TAKE 2 TABLETS ON MONDAY AND FRIDAY, THEN 1 TABLET ALL OTHER DAYS OF THE WEEK     Allergies:   Statins   Social History   Tobacco Use  . Smoking status: Never Smoker  . Smokeless tobacco: Never Used  Vaping Use  . Vaping Use: Never used  Substance Use Topics  . Alcohol use: No  . Drug use: Never     Family Hx: The patient's family  history includes Heart attack in her father and mother; Parkinson's disease in her son.  ROS:   Please see the history of present illness.    All other systems reviewed and are negative.   Prior CV studies:   The following studies were reviewed today: Comprehensive pacemaker check today  Labs/Other Tests and Data Reviewed:    EKG: The ECG performed today shows atrial paced, ventricular sensed rhythm.  The AV delay is long at approximately 330 ms.  Recent Labs: 01/16/2020: Hemoglobin 12.4; Platelets 206 06/13/2020: ALT 11 10/23/2020: BUN 19; Creatinine, Ser 1.07; Potassium 3.9; Sodium 140; TSH 1.780   Recent Lipid Panel Lab Results  Component Value Date/Time   CHOL 223 (H) 05/21/2016 09:54 AM   TRIG 120 05/21/2016 09:54 AM   HDL 52 05/21/2016 09:54 AM   CHOLHDL 4.3 05/21/2016 09:54 AM   CHOLHDL 4.8 12/26/2012 10:57 AM   LDLCALC 147 (H) 05/21/2016 09:54 AM    Wt Readings from Last 3 Encounters:  10/27/20 152 lb (68.9 kg)  10/23/20 152 lb 6.4 oz (69.1 kg)  09/10/20 155 lb (70.3 kg)     Objective:    Vital Signs:  BP 140/78   Pulse (!) 121   Ht 5\' 7"  (1.702 m)   Wt 152 lb (68.9 kg)   SpO2 96%   BMI 23.81 kg/m     General: Alert, oriented x3, no distress, healthy PM site Head: no evidence of trauma, PERRL, EOMI, no exophtalmos or lid lag, no myxedema, no xanthelasma; normal ears, nose and oropharynx Neck: normal jugular venous pulsations and no hepatojugular reflux; brisk carotid pulses without delay and no carotid bruits Chest: clear to auscultation, no signs of consolidation by percussion or palpation, normal fremitus, symmetrical and full respiratory excursions Cardiovascular: normal position and quality of the apical impulse, regular rhythm, normal first and second heart sounds, no murmurs, rubs or gallops Abdomen: no tenderness or distention, no masses by palpation, no abnormal pulsatility or arterial bruits, normal bowel sounds, no hepatosplenomegaly Extremities:  no clubbing, cyanosis or edema; 2+ radial, ulnar and brachial pulses bilaterally; 2+ right femoral, posterior tibial and dorsalis pedis pulses; 2+ left femoral, posterior tibial and dorsalis pedis pulses; no subclavian or femoral bruits Neurological: grossly nonfocal Psych: Normal mood and affect   ASSESSMENT & PLAN:    1. Paroxysmal atrial fibrillation (HCC)   2. SSS (sick sinus syndrome) ([place])   3. Long term current use of anticoagulant   4. Pacemaker   5. Essential hypertension   6. History of mitral valve repair      1. AFib: The true burden of atrial fibrillation is very low at less than 0.1%, but she has had lengthy episodes for a few hours.  On appropriate anticoagulation. CHADSVasc 4 (age 28, HTN, gender).  Although she has a history of mitral valve repair she did not have rheumatic valvular heart disease.  2. Warfarin: Prefers Coumadin to the newer anticoagulants.  She is monitored by her primary care provider in [place]. 3.  Tachy-bradycardia sd: Appropriate heart rate histogram distribution with her current sensor settings. 4. PM: Reprogramming has resolved most of the issues with atrial lead oversensing.  Consider new lead when it comes time for generator change out (although the issue may not be only believed, but also decreased atrial voltages after her maze procedure) 5. HTN: Very well controlled, usually substantially lower than in the office today. 6. Hx MV repair: No symptoms of heart failure.  No murmur on physical exam.  Has not had an echocardiogram since September 2013.   Patient Instructions  Medication Instructions:  No changes *If you need a refill on your cardiac medications before your next appointment, please call your pharmacy*   Lab Work: None ordered If you have labs (blood work) drawn today and your tests are completely normal, you will receive your results only by: Marland Kitchen MyChart Message (if you have MyChart) OR . A paper copy in the mail If you have any  lab test that is abnormal or we need to change your treatment, we will call you to review the results.   Testing/Procedures: None ordered   Follow-Up: At [facility], you and your health needs are our priority.  As part of our continuing mission to provide you with exceptional heart care, we have created designated Provider Care Teams.  These Care Teams include your primary Cardiologist (physician) and Advanced Practice Providers (APPs -  Physician Assistants and Nurse Practitioners) who all work together to provide you with the care you need, when you need it.  We recommend signing up for the patient portal called "MyChart".  Sign up information is provided on this After Visit Summary.  MyChart is used to connect with patients for Virtual Visits (Telemedicine).  Patients are able to view lab/test results, encounter notes, upcoming appointments, etc.  Non-urgent messages can be sent to your provider as well.   To learn more about what you can do with MyChart, go to [email or web address].    Your next appointment:   12 month(s)  The format for your next appointment:   In Person  Provider:   Sanda Klein, MD      Signed, Sanda Klein, MD  10/27/2020 12:28 PM    [place]

## 2020-10-27 NOTE — Patient Instructions (Signed)

## 2020-12-02 ENCOUNTER — Ambulatory Visit (INDEPENDENT_AMBULATORY_CARE_PROVIDER_SITE_OTHER): Payer: MEDICARE

## 2020-12-02 DIAGNOSIS — I495 Sick sinus syndrome: Secondary | ICD-10-CM

## 2020-12-03 ENCOUNTER — Ambulatory Visit (INDEPENDENT_AMBULATORY_CARE_PROVIDER_SITE_OTHER): Payer: MEDICARE | Admitting: Family Medicine

## 2020-12-03 ENCOUNTER — Encounter: Payer: Self-pay | Admitting: Family Medicine

## 2020-12-03 ENCOUNTER — Other Ambulatory Visit: Payer: Self-pay

## 2020-12-03 VITALS — BP 139/82 | HR 85 | Temp 98.6°F | Ht 67.0 in | Wt 154.4 lb

## 2020-12-03 DIAGNOSIS — D6869 Other thrombophilia: Secondary | ICD-10-CM

## 2020-12-03 DIAGNOSIS — Z9889 Other specified postprocedural states: Secondary | ICD-10-CM

## 2020-12-03 DIAGNOSIS — I480 Paroxysmal atrial fibrillation: Secondary | ICD-10-CM | POA: Diagnosis not present

## 2020-12-03 DIAGNOSIS — D692 Other nonthrombocytopenic purpura: Secondary | ICD-10-CM | POA: Diagnosis not present

## 2020-12-03 LAB — CUP PACEART REMOTE DEVICE CHECK
Battery Remaining Longevity: 37 mo
Battery Voltage: 2.94 V
Brady Statistic AP VP Percent: 0.21 %
Brady Statistic AP VS Percent: 79.12 %
Brady Statistic AS VP Percent: 0.03 %
Brady Statistic AS VS Percent: 20.65 %
Brady Statistic RA Percent Paced: 80.26 %
Brady Statistic RV Percent Paced: 0.24 %
Date Time Interrogation Session: 20220608084330
Implantable Lead Implant Date: 20081126
Implantable Lead Implant Date: 20081126
Implantable Lead Location: 753859
Implantable Lead Location: 753860
Implantable Lead Model: 5076
Implantable Lead Model: 5076
Implantable Pulse Generator Implant Date: 20180228
Lead Channel Impedance Value: 323 Ohm
Lead Channel Impedance Value: 361 Ohm
Lead Channel Impedance Value: 380 Ohm
Lead Channel Impedance Value: 399 Ohm
Lead Channel Pacing Threshold Amplitude: 1.25 V
Lead Channel Pacing Threshold Pulse Width: 0.4 ms
Lead Channel Sensing Intrinsic Amplitude: 0.125 mV
Lead Channel Sensing Intrinsic Amplitude: 0.125 mV
Lead Channel Sensing Intrinsic Amplitude: 7.375 mV
Lead Channel Sensing Intrinsic Amplitude: 7.375 mV
Lead Channel Setting Pacing Amplitude: 3.25 V
Lead Channel Setting Pacing Amplitude: 3.5 V
Lead Channel Setting Pacing Pulse Width: 0.4 ms
Lead Channel Setting Sensing Sensitivity: 2 mV

## 2020-12-03 LAB — COAGUCHEK XS/INR WAIVED
INR: 2.2 — ABNORMAL HIGH (ref 0.9–1.1)
Prothrombin Time: 26.1 s

## 2020-12-03 NOTE — Progress Notes (Signed)
**Note Melissa-Identified via Obfuscation** Subjective: CC: INR check PCP: Melissa Norlander, DO [name]:[name] is a 87 y.o. female presenting to clinic today for:  1. Afib/ MVR Goal INR is 2-3.  [name]  She is here for 6-week follow-up.  She continues to take 2 tablets on Mondays and Fridays 1 tablet daily all other days.  No hematochezia or melena.   ROS: Per HPI  Allergies  Allergen Reactions  . Statins Other (See Comments)    Myalgia   Past Medical History:  Diagnosis Date  . A-fib ([place])   . Dyslipidemia   . Hyperlipidemia   . Hypothyroidism    2 degree amidarone   . Pacemaker   . PAF (paroxysmal atrial fibrillation) ([place])   . S/P mitral valve repair   . SSS (sick sinus syndrome) ([place])   . Systemic hypertension     Current Outpatient Medications:  .  levothyroxine (SYNTHROID) 25 MCG tablet, Take 1 tablet (25 mcg total) by mouth daily., Disp: 90 tablet, Rfl: 1 .  metoprolol tartrate (LOPRESSOR) 50 MG tablet, TAKE 1 TABLET TWICE A DAY, Disp: 180 tablet, Rfl: 3 .  Polyethyl Glycol-Propyl Glycol (SYSTANE OP), Apply 1 drop to eye daily as needed (dry eyes)., Disp: , Rfl:  .  triamterene-hydrochlorothiazide (DYAZIDE) 37.5-25 MG capsule, TAKE 1 CAPSULE DAILY, Disp: 90 capsule, Rfl: 3 .  warfarin (COUMADIN) 2.5 MG tablet, TAKE 2 TABLETS ON MONDAY AND FRIDAY, THEN 1 TABLET ALL OTHER DAYS OF THE WEEK, Disp: 150 tablet, Rfl: 0 Social History   Socioeconomic History  . Marital status: Married    Spouse name: Not on file  . Number of children: 3  . Years of education: Not on file  . Highest education level: High school graduate  Occupational History  . Occupation: Retired  Tobacco Use  . Smoking status: Never Smoker  . Smokeless tobacco: Never Used  Vaping Use  . Vaping Use: Never used  Substance and Sexual Activity  . Alcohol use: No  . Drug use: Never  . Sexual activity: Not Currently  Other Topics Concern  . Not on file  Social History Narrative  . Not on file   Social  Determinants of Health   Financial Resource Strain: Not on file  Food Insecurity: Not on file  Transportation Needs: Not on file  Physical Activity: Not on file  Stress: Not on file  Social Connections: Not on file  Intimate Partner Violence: Not on file   Family History  Problem Relation Age of Onset  . Heart attack Mother   . Heart attack Father   . Parkinson's disease Son     Objective: Office vital signs reviewed. BP 139/82   Pulse 85   Temp 98.6 F (37 C)   Ht 5\' 7"  (1.702 m)   Wt 154 lb 6.4 oz (70 kg)   SpO2 97%   BMI 24.18 kg/m   Physical Examination:  General: Awake, alert, elderly female, No acute distress HEENT: Normal. Sclera white, MMM Cardio: regular rate and rhythm, S1S2 heard, no murmurs appreciated Pulm: clear to auscultation bilaterally, no wheezes, rhonchi or rales; normal work of breathing on room air Skin: Senile purpura noted MSK: Antalgic gait.  Valgus deformity noted.  Ambulates with use of cane  Assessment/ Plan: 87 y.o. female   Paroxysmal atrial fibrillation (HCC) - Plan: CoaguChek XS/INR Waived  S/P mitral valve repair - Plan: CoaguChek XS/INR Waived  Acquired thrombophilia ([place]) - Plan: CoaguChek XS/INR Waived  Purpura senilis (HCC)  INR therapeutic.  Continue current regimen.    For personalities likely secondary to chronic anticoagulation in setting of A. fib and MVR  Follow-up in 6 weeks  No orders of the defined types were placed in this encounter.  No orders of the defined types were placed in this encounter.    Melissa Norlander, DO [place] [phone]

## 2020-12-24 NOTE — Progress Notes (Signed)
Remote pacemaker transmission.   

## 2021-01-12 ENCOUNTER — Other Ambulatory Visit: Payer: Self-pay | Admitting: Family Medicine

## 2021-01-14 ENCOUNTER — Ambulatory Visit (INDEPENDENT_AMBULATORY_CARE_PROVIDER_SITE_OTHER): Payer: MEDICARE | Admitting: Family Medicine

## 2021-01-14 ENCOUNTER — Encounter: Payer: Self-pay | Admitting: Family Medicine

## 2021-01-14 ENCOUNTER — Other Ambulatory Visit: Payer: Self-pay

## 2021-01-14 VITALS — BP 131/76 | HR 77 | Temp 97.9°F | Ht 67.0 in | Wt 154.4 lb

## 2021-01-14 DIAGNOSIS — R791 Abnormal coagulation profile: Secondary | ICD-10-CM

## 2021-01-14 DIAGNOSIS — I480 Paroxysmal atrial fibrillation: Secondary | ICD-10-CM

## 2021-01-14 DIAGNOSIS — Z9889 Other specified postprocedural states: Secondary | ICD-10-CM | POA: Diagnosis not present

## 2021-01-14 LAB — COAGUCHEK XS/INR WAIVED
INR: 1.7 — ABNORMAL HIGH (ref 0.9–1.1)
Prothrombin Time: 20.5 s

## 2021-01-14 LAB — POCT INR: INR: 1.7 — AB (ref 2–3)

## 2021-01-14 NOTE — Progress Notes (Signed)
Subjective: CC:inr PCP: Melissa Norlander, DO IRJ:JOACZ H Stickley is Melissa 87 y.o. [name] presenting to clinic today for:  1.  Atrial fibrillation with history of MVR Goal INR is 2-3.  [name]  She is here for her typical follow-up.  Currently taking 2 tablets of Coumadin on Wednesdays/ Fridays and 1 tablet daily all other days. No Missed doses, new medications including antibiotics.  She has been eating Melissa little bit more vegetables as of late but otherwise really has not done anything differently.  No hematochezia, melena or epistaxis.  No heart palpitations or change in exercise tolerance   ROS: Per HPI  Allergies  Allergen Reactions   Statins Other (See Comments)    Myalgia   Past Medical History:  Diagnosis Date   Melissa-fib (HCC)    Dyslipidemia    Hyperlipidemia    Hypothyroidism    2 degree amidarone    Pacemaker    PAF (paroxysmal atrial fibrillation) (HCC)    S/P mitral valve repair    SSS (sick sinus syndrome) (HCC)    Systemic hypertension     Current Outpatient Medications:    levothyroxine (SYNTHROID) 25 MCG tablet, Take 1 tablet (25 mcg total) by mouth daily., Disp: 90 tablet, Rfl: 1   metoprolol tartrate (LOPRESSOR) 50 MG tablet, TAKE 1 TABLET TWICE Melissa DAY, Disp: 180 tablet, Rfl: 3   Polyethyl Glycol-Propyl Glycol (SYSTANE OP), Apply 1 drop to eye daily as needed (dry eyes)., Disp: , Rfl:    triamterene-hydrochlorothiazide (DYAZIDE) 37.5-25 MG capsule, TAKE 1 CAPSULE DAILY, Disp: 90 capsule, Rfl: 3   warfarin (COUMADIN) 2.5 MG tablet, TAKE 2 TABLETS ON MONDAY AND FRIDAY, THEN 1 TABLET ALL OTHER DAYS OF THE WEEK, Disp: 150 tablet, Rfl: 0 Social History   Socioeconomic History   Marital status: Married    Spouse name: Not on file   Number of children: 3   Years of education: Not on file   Highest education level: High school graduate  Occupational History   Occupation: Retired  Tobacco Use   Smoking status: Never   Smokeless tobacco:  Never  Vaping Use   Vaping Use: Never used  Substance and Sexual Activity   Alcohol use: No   Drug use: Never   Sexual activity: Not Currently  Other Topics Concern   Not on file  Social History Narrative   Not on file   Social Determinants of Health   Financial Resource Strain: Not on file  Food Insecurity: Not on file  Transportation Needs: Not on file  Physical Activity: Not on file  Stress: Not on file  Social Connections: Not on file  Intimate Partner Violence: Not on file   Family History  Problem Relation Age of Onset   Heart attack Mother    Heart attack Father    Parkinson's disease Son     Objective: Office vital signs reviewed. BP 131/76   Pulse 77   Temp 97.9 F (36.6 C)   Ht 5\' 7"  (1.702 m)   Wt 154 lb 6.4 oz (70 kg)   SpO2 99%   BMI 24.18 kg/m   Physical Examination:  General: Awake, alert, chronically ill-appearing [name], No acute distress Cardio: Irregularly irregular with rate control.  S1-S2 heard Pulm: clear to auscultation bilaterally, no wheezes, rhonchi or rales; normal work of breathing on room air MSK: Valgus deformity noted.  Ambulating independently with Melissa cane.  Assessment/ Plan: 87 y.o. [name]   Paroxysmal atrial fibrillation ([place]) -  Plan: POCT INR  S/P mitral valve repair - Plan: POCT INR  Subtherapeutic international normalized ratio (INR) - Plan: CoaguChek XS/INR Waived, POCT INR INR subtherapeutic at 1.7.  Advanced 2 tablets Wednesdays, Thursdays and Fridays 1 tablet daily all others.  We will see each other back in 1 week for recheck  Orders Placed This Encounter  Procedures   CoaguChek XS/INR Waived   No orders of the defined types were placed in this encounter.    Melissa Norlander, DO [place] [phone]

## 2021-01-14 NOTE — Patient Instructions (Signed)
Go up to 2 tablets on Thursday as well.  Continue 2 tablets on Wednesday and Friday and 1 tablet all other days.

## 2021-01-21 ENCOUNTER — Ambulatory Visit: Payer: MEDICARE | Admitting: Family Medicine

## 2021-01-21 NOTE — Progress Notes (Deleted)
Subjective: CC: Afib PCP: [name], DO [id number] [name] is a 87 y.o. female presenting to clinic today for:  1. Afib Goal INR 2-3.  Patient's INR was subtherapeutic last visit 01/14/21. She was increased to 2 tablets on Thursday and continued on 2 tablets Wed, Fri and 1 tablet daily all other days. ***   ROS: Per HPI  Allergies  Allergen Reactions   Statins Other (See Comments)    Myalgia   Past Medical History:  Diagnosis Date   A-fib ([place])    Dyslipidemia    Hyperlipidemia    Hypothyroidism    2 degree amidarone    Pacemaker    PAF (paroxysmal atrial fibrillation) (HCC)    S/P mitral valve repair    SSS (sick sinus syndrome) (HCC)    Systemic hypertension     Current Outpatient Medications:    levothyroxine (SYNTHROID) 25 MCG tablet, Take 1 tablet (25 mcg total) by mouth daily., Disp: 90 tablet, Rfl: 1   metoprolol tartrate (LOPRESSOR) 50 MG tablet, TAKE 1 TABLET TWICE A DAY, Disp: 180 tablet, Rfl: 3   Polyethyl Glycol-Propyl Glycol (SYSTANE OP), Apply 1 drop to eye daily as needed (dry eyes)., Disp: , Rfl:    triamterene-hydrochlorothiazide (DYAZIDE) 37.5-25 MG capsule, TAKE 1 CAPSULE DAILY, Disp: 90 capsule, Rfl: 3   warfarin (COUMADIN) 2.5 MG tablet, TAKE 2 TABLETS ON MONDAY AND FRIDAY, THEN 1 TABLET ALL OTHER DAYS OF THE WEEK, Disp: 150 tablet, Rfl: 0 Social History   Socioeconomic History   Marital status: Married    Spouse name: Not on file   Number of children: 3   Years of education: Not on file   Highest education level: High school graduate  Occupational History   Occupation: Retired  Tobacco Use   Smoking status: Never   Smokeless tobacco: Never  Vaping Use   Vaping Use: Never used  Substance and Sexual Activity   Alcohol use: No   Drug use: Never   Sexual activity: Not Currently  Other Topics Concern   Not on file  Social History Narrative   Not on file   Social Determinants of Health   Financial Resource Strain: Not on file   Food Insecurity: Not on file  Transportation Needs: Not on file  Physical Activity: Not on file  Stress: Not on file  Social Connections: Not on file  Intimate Partner Violence: Not on file   Family History  Problem Relation Age of Onset   Heart attack Mother    Heart attack Father    Parkinson's disease Son     Objective: Office vital signs reviewed. There were no vitals taken for this visit.  Physical Examination:  General: Awake, alert, *** nourished, No acute distress HEENT: Normal    Neck: No masses palpated. No lymphadenopathy    Ears: Tympanic membranes intact, normal light reflex, no erythema, no bulging    Eyes: PERRLA, extraocular membranes intact, sclera ***    Nose: nasal turbinates moist, *** nasal discharge    Throat: moist mucus membranes, no erythema, *** tonsillar exudate.  Airway is patent Cardio: regular rate and rhythm, S1S2 heard, no murmurs appreciated Pulm: clear to auscultation bilaterally, no wheezes, rhonchi or rales; normal work of breathing on room air GI: soft, non-tender, non-distended, bowel sounds present x4, no hepatomegaly, no splenomegaly, no masses GU: external vaginal tissue ***, cervix ***, *** punctate lesions on cervix appreciated, *** discharge from cervical os, *** bleeding, *** cervical motion tenderness, *** abdominal/ adnexal masses  Extremities: warm, well perfused, No edema, cyanosis or clubbing; +*** pulses bilaterally MSK: *** gait and *** station Skin: dry; intact; no rashes or lesions Neuro: *** Strength and light touch sensation grossly intact, *** DTRs ***/4  Assessment/ Plan: 87 y.o. female   ***  No orders of the defined types were placed in this encounter.  No orders of the defined types were placed in this encounter.    [name], DO [place] [phone]

## 2021-01-22 ENCOUNTER — Encounter: Payer: Self-pay | Admitting: Family Medicine

## 2021-01-27 ENCOUNTER — Encounter: Payer: Self-pay | Admitting: Family Medicine

## 2021-01-27 ENCOUNTER — Other Ambulatory Visit: Payer: Self-pay

## 2021-01-27 ENCOUNTER — Ambulatory Visit (INDEPENDENT_AMBULATORY_CARE_PROVIDER_SITE_OTHER): Payer: MEDICARE | Admitting: Family Medicine

## 2021-01-27 VITALS — BP 118/77 | HR 88 | Temp 97.1°F | Ht 67.0 in | Wt 154.4 lb

## 2021-01-27 DIAGNOSIS — I480 Paroxysmal atrial fibrillation: Secondary | ICD-10-CM

## 2021-01-27 DIAGNOSIS — Z9889 Other specified postprocedural states: Secondary | ICD-10-CM

## 2021-01-27 LAB — COAGUCHEK XS/INR WAIVED
INR: 2.2 — ABNORMAL HIGH (ref 0.9–1.1)
Prothrombin Time: 26.5 s

## 2021-01-27 NOTE — Progress Notes (Signed)
Subjective: CC: INR check PCP: [name], DO [id number] [name] is a 87 y.o. female presenting to clinic today for:  1.  Subtherapeutic INR Patient's goal INR is 2-3.  Her last INR was subtherapeutic at 1.7.  She was instructed to go up to 2 tablets Wednesdays, Thursdays and Fridays and then continue 1 tablet all other days.  She unfortunately missed her appointment last Friday and is here for follow-up today.  She has been taking 2 tablets twice per week with 1 tablet daily all others.  She denies any bleeding episodes.  She has been having a little bit of sinus congestion and notes feeling a little bit weaker today than her normal but otherwise has been doing well.   ROS: Per HPI  Allergies  Allergen Reactions   Statins Other (See Comments)    Myalgia   Past Medical History:  Diagnosis Date   A-fib ([place])    Dyslipidemia    Hyperlipidemia    Hypothyroidism    2 degree amidarone    Pacemaker    PAF (paroxysmal atrial fibrillation) (HCC)    S/P mitral valve repair    SSS (sick sinus syndrome) (HCC)    Systemic hypertension     Current Outpatient Medications:    levothyroxine (SYNTHROID) 25 MCG tablet, Take 1 tablet (25 mcg total) by mouth daily., Disp: 90 tablet, Rfl: 1   metoprolol tartrate (LOPRESSOR) 50 MG tablet, TAKE 1 TABLET TWICE A DAY, Disp: 180 tablet, Rfl: 3   Polyethyl Glycol-Propyl Glycol (SYSTANE OP), Apply 1 drop to eye daily as needed (dry eyes)., Disp: , Rfl:    triamterene-hydrochlorothiazide (DYAZIDE) 37.5-25 MG capsule, TAKE 1 CAPSULE DAILY, Disp: 90 capsule, Rfl: 3   warfarin (COUMADIN) 2.5 MG tablet, TAKE 2 TABLETS ON MONDAY AND FRIDAY, THEN 1 TABLET ALL OTHER DAYS OF THE WEEK, Disp: 150 tablet, Rfl: 0 Social History   Socioeconomic History   Marital status: Married    Spouse name: Not on file   Number of children: 3   Years of education: Not on file   Highest education level: High school graduate  Occupational History   Occupation:  Retired  Tobacco Use   Smoking status: Never   Smokeless tobacco: Never  Vaping Use   Vaping Use: Never used  Substance and Sexual Activity   Alcohol use: No   Drug use: Never   Sexual activity: Not Currently  Other Topics Concern   Not on file  Social History Narrative   Not on file   Social Determinants of Health   Financial Resource Strain: Not on file  Food Insecurity: Not on file  Transportation Needs: Not on file  Physical Activity: Not on file  Stress: Not on file  Social Connections: Not on file  Intimate Partner Violence: Not on file   Family History  Problem Relation Age of Onset   Heart attack Mother    Heart attack Father    Parkinson's disease Son     Objective: Office vital signs reviewed. BP 118/77   Pulse 88   Temp (!) 97.1 F (36.2 C)   Ht '5\' 7"'$  (1.702 m)   Wt 154 lb 6.4 oz (70 kg)   SpO2 95%   BMI 24.18 kg/m   Physical Examination:  General: Awake, alert, chronically ill-appearing elderly female, No acute distress Cardio: Irregularly irregular Pulm: Normal work of breathing on room air MSK: Unsteady gait.  Requires assistance for ambulation and uses cane.  Valgus deformity of knees  Assessment/ Plan: 87 y.o. female   Paroxysmal atrial fibrillation ([place]) - Plan: CoaguChek XS/INR Waived  S/P mitral valve repair - Plan: CoaguChek XS/INR Waived  INR therapeutic at 2.2.  No changes needed.  Continue current regimen.  Follow-up in 6 weeks  No orders of the defined types were placed in this encounter.  No orders of the defined types were placed in this encounter.    [name], DO [place] [phone]

## 2021-02-17 ENCOUNTER — Other Ambulatory Visit: Payer: Self-pay | Admitting: Cardiovascular Disease

## 2021-03-03 ENCOUNTER — Ambulatory Visit (INDEPENDENT_AMBULATORY_CARE_PROVIDER_SITE_OTHER): Payer: MEDICARE

## 2021-03-03 DIAGNOSIS — I495 Sick sinus syndrome: Secondary | ICD-10-CM | POA: Diagnosis not present

## 2021-03-04 LAB — CUP PACEART REMOTE DEVICE CHECK
Battery Remaining Longevity: 33 mo
Battery Voltage: 2.93 V
Brady Statistic AP VP Percent: 0.21 %
Brady Statistic AP VS Percent: 80.49 %
Brady Statistic AS VP Percent: 0.02 %
Brady Statistic AS VS Percent: 19.28 %
Brady Statistic RA Percent Paced: 82.03 %
Brady Statistic RV Percent Paced: 0.32 %
Date Time Interrogation Session: 20220907013614
Implantable Lead Implant Date: 20081126
Implantable Lead Implant Date: 20081126
Implantable Lead Location: 753859
Implantable Lead Location: 753860
Implantable Lead Model: 5076
Implantable Lead Model: 5076
Implantable Pulse Generator Implant Date: 20180228
Lead Channel Impedance Value: 323 Ohm
Lead Channel Impedance Value: 361 Ohm
Lead Channel Impedance Value: 361 Ohm
Lead Channel Impedance Value: 399 Ohm
Lead Channel Pacing Threshold Amplitude: 1.125 V
Lead Channel Pacing Threshold Pulse Width: 0.4 ms
Lead Channel Sensing Intrinsic Amplitude: 0.125 mV
Lead Channel Sensing Intrinsic Amplitude: 0.125 mV
Lead Channel Sensing Intrinsic Amplitude: 8.875 mV
Lead Channel Sensing Intrinsic Amplitude: 8.875 mV
Lead Channel Setting Pacing Amplitude: 3 V
Lead Channel Setting Pacing Amplitude: 3.5 V
Lead Channel Setting Pacing Pulse Width: 0.4 ms
Lead Channel Setting Sensing Sensitivity: 2 mV

## 2021-03-10 ENCOUNTER — Ambulatory Visit (INDEPENDENT_AMBULATORY_CARE_PROVIDER_SITE_OTHER): Payer: MEDICARE | Admitting: Family Medicine

## 2021-03-10 ENCOUNTER — Encounter: Payer: Self-pay | Admitting: Family Medicine

## 2021-03-10 ENCOUNTER — Other Ambulatory Visit: Payer: Self-pay

## 2021-03-10 VITALS — BP 134/71 | HR 88 | Temp 97.3°F | Ht 67.0 in | Wt 154.4 lb

## 2021-03-10 DIAGNOSIS — I480 Paroxysmal atrial fibrillation: Secondary | ICD-10-CM

## 2021-03-10 DIAGNOSIS — Z9889 Other specified postprocedural states: Secondary | ICD-10-CM | POA: Diagnosis not present

## 2021-03-10 LAB — POCT INR: INR: 3.6 — AB (ref 2–3)

## 2021-03-10 LAB — COAGUCHEK XS/INR WAIVED
INR: 3.6 — ABNORMAL HIGH (ref 0.9–1.1)
Prothrombin Time: 43.3 s

## 2021-03-10 NOTE — Patient Instructions (Signed)
SKIP today's coumadin. EAT GREENS Go back to normal dosing schedule tomorrow.  Recheck INR at 3pm on Wednesday 9/21.

## 2021-03-10 NOTE — Progress Notes (Signed)
Subjective: CC: Check INR PCP: [name], DO [id number] [name] is a 87 y.o. female presenting to clinic today for:  1.  History of mitral valve repair/A. fib Goal INR 2-3 Patient's INR was therapeutic at 2.2 last visit.  She is here for 6-week follow-up.  She has not been eating her normal foods because her husband has been hospitalized after he sustained a fracture to his hip.  She has been compliant with all her medications.  Has not taken today's dose.  Continues to take 2 tablets on Mondays and Fridays and 1 tablet daily all other days.  No bleeding reported.   ROS: Per HPI  Allergies  Allergen Reactions   Statins Other (See Comments)    Myalgia   Past Medical History:  Diagnosis Date   A-fib ([place])    Dyslipidemia    Hyperlipidemia    Hypothyroidism    2 degree amidarone    Pacemaker    PAF (paroxysmal atrial fibrillation) (HCC)    S/P mitral valve repair    SSS (sick sinus syndrome) (HCC)    Systemic hypertension     Current Outpatient Medications:    levothyroxine (SYNTHROID) 25 MCG tablet, Take 1 tablet (25 mcg total) by mouth daily., Disp: 90 tablet, Rfl: 1   metoprolol tartrate (LOPRESSOR) 50 MG tablet, TAKE 1 TABLET TWICE A DAY, Disp: 180 tablet, Rfl: 3   Polyethyl Glycol-Propyl Glycol (SYSTANE OP), Apply 1 drop to eye daily as needed (dry eyes)., Disp: , Rfl:    triamterene-hydrochlorothiazide (DYAZIDE) 37.5-25 MG capsule, TAKE 1 CAPSULE DAILY, Disp: 90 capsule, Rfl: 3   warfarin (COUMADIN) 2.5 MG tablet, TAKE 2 TABLETS ON MONDAY AND FRIDAY, THEN 1 TABLET ALL OTHER DAYS OF THE WEEK, Disp: 150 tablet, Rfl: 0 Social History   Socioeconomic History   Marital status: Married    Spouse name: Not on file   Number of children: 3   Years of education: Not on file   Highest education level: High school graduate  Occupational History   Occupation: Retired  Tobacco Use   Smoking status: Never   Smokeless tobacco: Never  Vaping Use   Vaping Use: Never  used  Substance and Sexual Activity   Alcohol use: No   Drug use: Never   Sexual activity: Not Currently  Other Topics Concern   Not on file  Social History Narrative   Not on file   Social Determinants of Health   Financial Resource Strain: Not on file  Food Insecurity: Not on file  Transportation Needs: Not on file  Physical Activity: Not on file  Stress: Not on file  Social Connections: Not on file  Intimate Partner Violence: Not on file   Family History  Problem Relation Age of Onset   Heart attack Mother    Heart attack Father    Parkinson's disease Son     Objective: Office vital signs reviewed. BP 134/71   Pulse 88   Temp (!) 97.3 F (36.3 C)   Ht '5\' 7"'$  (1.702 m)   Wt 154 lb 6.4 oz (70 kg)   SpO2 90%   BMI 24.18 kg/m   Physical Examination:  General: Awake, alert, nontoxic elderly female, No acute distress HEENT: No pallor MSK: Ambulating with use of cane.  Assessment/ Plan: 87 y.o. female   Paroxysmal atrial fibrillation ([place]) - Plan: CoaguChek XS/INR Waived, POCT INR  S/P mitral valve repair - Plan: CoaguChek XS/INR Waived, POCT INR  INR supratherapeutic.  Skip today's dose.  Incorporate greens into diet.  Resume normal dosing schedule thereafter.  Follow-up in 1 week for recheck.  Date and time given to patient.  No orders of the defined types were placed in this encounter.  No orders of the defined types were placed in this encounter.    [name], DO [place] [phone]

## 2021-03-12 NOTE — Progress Notes (Signed)
Remote pacemaker transmission.   

## 2021-03-18 ENCOUNTER — Ambulatory Visit (INDEPENDENT_AMBULATORY_CARE_PROVIDER_SITE_OTHER): Payer: MEDICARE | Admitting: Family Medicine

## 2021-03-18 ENCOUNTER — Encounter: Payer: Self-pay | Admitting: Family Medicine

## 2021-03-18 ENCOUNTER — Other Ambulatory Visit: Payer: Self-pay

## 2021-03-18 VITALS — BP 118/70 | HR 80 | Temp 97.6°F | Ht 67.0 in | Wt 154.4 lb

## 2021-03-18 DIAGNOSIS — R791 Abnormal coagulation profile: Secondary | ICD-10-CM

## 2021-03-18 DIAGNOSIS — I480 Paroxysmal atrial fibrillation: Secondary | ICD-10-CM | POA: Diagnosis not present

## 2021-03-18 DIAGNOSIS — Z9889 Other specified postprocedural states: Secondary | ICD-10-CM

## 2021-03-18 LAB — COAGUCHEK XS/INR WAIVED
INR: 1.8 — ABNORMAL HIGH (ref 0.9–1.1)
Prothrombin Time: 21.4 s

## 2021-03-18 LAB — POCT INR: INR: 1.8 — AB (ref 2–3)

## 2021-03-18 NOTE — Progress Notes (Signed)
Subjective: CC: Follow-up supratherapeutic INR PCP: [name], DO [name] is a 87 y.o. female presenting to clinic today for:  1.  Supratherapeutic INR Patient is anticoagulated for history of mitral valve repair and atrial fibrillation.  Goal INR is 2-3.  Her last INR was 3.6.  She was instructed to skip that days Coumadin and increase her greens intake.  She was then supposed to go back to normal dosing schedule of 2 tablets on Mondays and Fridays with 1 tablet daily all other days.  She did exactly as recommended and is here today for checkup   ROS: Per HPI  Allergies  Allergen Reactions   Statins Other (See Comments)    Myalgia   Past Medical History:  Diagnosis Date   A-fib ([place])    Dyslipidemia    Hyperlipidemia    Hypothyroidism    2 degree amidarone    Pacemaker    PAF (paroxysmal atrial fibrillation) (HCC)    S/P mitral valve repair    SSS (sick sinus syndrome) (HCC)    Systemic hypertension     Current Outpatient Medications:    levothyroxine (SYNTHROID) 25 MCG tablet, Take 1 tablet (25 mcg total) by mouth daily., Disp: 90 tablet, Rfl: 1   metoprolol tartrate (LOPRESSOR) 50 MG tablet, TAKE 1 TABLET TWICE A DAY, Disp: 180 tablet, Rfl: 3   Polyethyl Glycol-Propyl Glycol (SYSTANE OP), Apply 1 drop to eye daily as needed (dry eyes)., Disp: , Rfl:    triamterene-hydrochlorothiazide (DYAZIDE) 37.5-25 MG capsule, TAKE 1 CAPSULE DAILY, Disp: 90 capsule, Rfl: 3   warfarin (COUMADIN) 2.5 MG tablet, TAKE 2 TABLETS ON MONDAY AND FRIDAY, THEN 1 TABLET ALL OTHER DAYS OF THE WEEK, Disp: 150 tablet, Rfl: 0 Social History   Socioeconomic History   Marital status: Married    Spouse name: Not on file   Number of children: 3   Years of education: Not on file   Highest education level: High school graduate  Occupational History   Occupation: Retired  Tobacco Use   Smoking status: Never   Smokeless tobacco: Never  Vaping Use   Vaping Use: Never used   Substance and Sexual Activity   Alcohol use: No   Drug use: Never   Sexual activity: Not Currently  Other Topics Concern   Not on file  Social History Narrative   Not on file   Social Determinants of Health   Financial Resource Strain: Not on file  Food Insecurity: Not on file  Transportation Needs: Not on file  Physical Activity: Not on file  Stress: Not on file  Social Connections: Not on file  Intimate Partner Violence: Not on file   Family History  Problem Relation Age of Onset   Heart attack Mother    Heart attack Father    Parkinson's disease Son     Objective: Office vital signs reviewed. BP 118/70   Pulse 80   Temp 97.6 F (36.4 C)   Ht 5\' 7"  (1.702 m)   Wt 154 lb 6.4 oz (70 kg)   SpO2 95%   BMI 24.18 kg/m   Physical Examination:  General: Awake, alert, well nourished, No acute distress HEENT: Normal, sclera white, MMM Cardio: regular rate   Pulm: normal work of breathing on room air  Assessment/ Plan: 87 y.o. female   Subtherapeutic international normalized ratio (INR) - Plan: CoaguChek XS/INR Waived, POCT INR  Paroxysmal atrial fibrillation (HCC) - Plan: POCT INR  S/P mitral valve repair - Plan:  POCT INR  Increase to 2 tablets today then resume normal dosing schedule. Off greens See me back on Fri 9/30 for recheck.  Orders Placed This Encounter  Procedures   CoaguChek XS/INR Waived   No orders of the defined types were placed in this encounter.    [name], DO [place] [phone]

## 2021-03-18 NOTE — Patient Instructions (Signed)
Blood is too thick today. INR 1.8 Increase Coumadin to TWO (2) tablets today.   Then go back to normal regimen of 2 tablets Mondays and Fridays and 1 tablet daily all other days. Back off on Greens See me back next Friday 9/30 at 1030am for recheck INR

## 2021-03-27 ENCOUNTER — Ambulatory Visit: Payer: MEDICARE | Admitting: Family Medicine

## 2021-03-30 ENCOUNTER — Other Ambulatory Visit: Payer: Self-pay

## 2021-03-30 ENCOUNTER — Encounter: Payer: Self-pay | Admitting: Family Medicine

## 2021-03-30 ENCOUNTER — Ambulatory Visit (INDEPENDENT_AMBULATORY_CARE_PROVIDER_SITE_OTHER): Payer: MEDICARE | Admitting: Family Medicine

## 2021-03-30 VITALS — BP 124/69 | HR 84 | Temp 97.9°F | Ht 67.0 in | Wt 154.4 lb

## 2021-03-30 DIAGNOSIS — R791 Abnormal coagulation profile: Secondary | ICD-10-CM

## 2021-03-30 DIAGNOSIS — I480 Paroxysmal atrial fibrillation: Secondary | ICD-10-CM

## 2021-03-30 DIAGNOSIS — Z9889 Other specified postprocedural states: Secondary | ICD-10-CM

## 2021-03-30 LAB — COAGUCHEK XS/INR WAIVED
INR: 3.5 — ABNORMAL HIGH (ref 0.9–1.1)
Prothrombin Time: 42.4 s

## 2021-03-30 LAB — POCT INR: INR: 3.5 — AB (ref 2–3)

## 2021-03-30 NOTE — Progress Notes (Signed)
Subjective: CC: INR check PCP: [name], DO GYJ:[name] is a 87 y.o. female presenting to clinic today for:  1.  Atrial fibrillation, status post mitral valve repair Goal INR 2-3 Last visit patient's INR was subtherapeutic.  She was advised to discontinue consuming greens, increase her dose by 1 tablet and then resume normal dosing thereafter.  She presents today and admits that she continues not to eat regular scheduled meals because she is trying to acclimate her husband, who is ill back into the home setting.  No bleeding episodes reported.   ROS: Per HPI  Allergies  Allergen Reactions   Statins Other (See Comments)    Myalgia   Past Medical History:  Diagnosis Date   A-fib ([place])    Dyslipidemia    Hyperlipidemia    Hypothyroidism    2 degree amidarone    Pacemaker    PAF (paroxysmal atrial fibrillation) (HCC)    S/P mitral valve repair    SSS (sick sinus syndrome) (HCC)    Systemic hypertension     Current Outpatient Medications:    levothyroxine (SYNTHROID) 25 MCG tablet, Take 1 tablet (25 mcg total) by mouth daily., Disp: 90 tablet, Rfl: 1   metoprolol tartrate (LOPRESSOR) 50 MG tablet, TAKE 1 TABLET TWICE A DAY, Disp: 180 tablet, Rfl: 3   Polyethyl Glycol-Propyl Glycol (SYSTANE OP), Apply 1 drop to eye daily as needed (dry eyes)., Disp: , Rfl:    triamterene-hydrochlorothiazide (DYAZIDE) 37.5-25 MG capsule, TAKE 1 CAPSULE DAILY, Disp: 90 capsule, Rfl: 3   warfarin (COUMADIN) 2.5 MG tablet, TAKE 2 TABLETS ON MONDAY AND FRIDAY, THEN 1 TABLET ALL OTHER DAYS OF THE WEEK, Disp: 150 tablet, Rfl: 0 Social History   Socioeconomic History   Marital status: Married    Spouse name: Not on file   Number of children: 3   Years of education: Not on file   Highest education level: High school graduate  Occupational History   Occupation: Retired  Tobacco Use   Smoking status: Never   Smokeless tobacco: Never  Vaping Use   Vaping Use: Never used   Substance and Sexual Activity   Alcohol use: No   Drug use: Never   Sexual activity: Not Currently  Other Topics Concern   Not on file  Social History Narrative   Not on file   Social Determinants of Health   Financial Resource Strain: Not on file  Food Insecurity: Not on file  Transportation Needs: Not on file  Physical Activity: Not on file  Stress: Not on file  Social Connections: Not on file  Intimate Partner Violence: Not on file   Family History  Problem Relation Age of Onset   Heart attack Mother    Heart attack Father    Parkinson's disease Son     Objective: Office vital signs reviewed. BP 124/69   Pulse 84   Temp 97.9 F (36.6 C)   Ht 5\' 7"  (1.702 m)   Wt 154 lb 6.4 oz (70 kg)   SpO2 98%   BMI 24.18 kg/m   Physical Examination:  General: Awake, alert, frail appearing elderly female. No acute distress Cardio: regular rate   Pulm: Normal work of breathing on room air MSK: Unsteady gait, requires assistance for ambulation  Assessment/ Plan: 87 y.o. female   Supratherapeutic INR - Plan: CoaguChek XS/INR Waived, POCT INR  Paroxysmal atrial fibrillation (HCC) - Plan: POCT INR  S/P mitral valve repair - Plan: POCT INR  Reduce Coumadin  to 1 tablet daily except for 2 tablets on Fridays.  Recheck in 2 weeks  Orders Placed This Encounter  Procedures   CoaguChek XS/INR Waived   No orders of the defined types were placed in this encounter.    [name], DO [place] [phone]

## 2021-03-30 NOTE — Patient Instructions (Addendum)
Blood too thin.  INR 3.5 today REDUCE to 1 tablet on Mondays.  Continue 2 tablets on Fridays and 1 tablet daily all other days. See me in 2 weeks for recheck

## 2021-04-13 ENCOUNTER — Encounter: Payer: Self-pay | Admitting: Family Medicine

## 2021-04-13 ENCOUNTER — Other Ambulatory Visit: Payer: Self-pay | Admitting: Family Medicine

## 2021-04-13 ENCOUNTER — Ambulatory Visit (INDEPENDENT_AMBULATORY_CARE_PROVIDER_SITE_OTHER): Payer: MEDICARE | Admitting: Family Medicine

## 2021-04-13 ENCOUNTER — Other Ambulatory Visit: Payer: Self-pay

## 2021-04-13 VITALS — BP 116/56 | HR 68 | Temp 97.0°F | Ht 67.0 in | Wt 154.4 lb

## 2021-04-13 DIAGNOSIS — Z9889 Other specified postprocedural states: Secondary | ICD-10-CM

## 2021-04-13 DIAGNOSIS — I480 Paroxysmal atrial fibrillation: Secondary | ICD-10-CM | POA: Diagnosis not present

## 2021-04-13 LAB — POCT INR: INR: 3.1 — AB (ref 2–3)

## 2021-04-13 NOTE — Progress Notes (Signed)
Subjective: CC: INR check PCP: Melissa Norlander, DO IHK:VQQVZ [name] is a 87 y.o. female presenting to clinic today for:  1.  INR check Last INR was supratherapeutic.  Patient was advised to take Coumadin 1 tablet daily except for 2 tablets on Fridays.  She is here for 2-week checkup.  She denies any bleeding episodes.   ROS: Per HPI  Allergies  Allergen Reactions   Statins Other (See Comments)    Myalgia   Past Medical History:  Diagnosis Date   A-fib ([place])    Dyslipidemia    Hyperlipidemia    Hypothyroidism    2 degree amidarone    Pacemaker    PAF (paroxysmal atrial fibrillation) (HCC)    S/P mitral valve repair    SSS (sick sinus syndrome) (HCC)    Systemic hypertension     Current Outpatient Medications:    levothyroxine (SYNTHROID) 25 MCG tablet, Take 1 tablet (25 mcg total) by mouth daily., Disp: 90 tablet, Rfl: 1   metoprolol tartrate (LOPRESSOR) 50 MG tablet, TAKE 1 TABLET TWICE A DAY, Disp: 180 tablet, Rfl: 3   Polyethyl Glycol-Propyl Glycol (SYSTANE OP), Apply 1 drop to eye daily as needed (dry eyes)., Disp: , Rfl:    triamterene-hydrochlorothiazide (DYAZIDE) 37.5-25 MG capsule, TAKE 1 CAPSULE DAILY, Disp: 90 capsule, Rfl: 3   warfarin (COUMADIN) 2.5 MG tablet, TAKE 2 TABLETS ON MONDAY AND FRIDAY, THEN 1 TABLET ALL OTHER DAYS OF THE WEEK, Disp: 150 tablet, Rfl: 0 Social History   Socioeconomic History   Marital status: Married    Spouse name: Not on file   Number of children: 3   Years of education: Not on file   Highest education level: High school graduate  Occupational History   Occupation: Retired  Tobacco Use   Smoking status: Never   Smokeless tobacco: Never  Vaping Use   Vaping Use: Never used  Substance and Sexual Activity   Alcohol use: No   Drug use: Never   Sexual activity: Not Currently  Other Topics Concern   Not on file  Social History Narrative   Not on file   Social Determinants of Health   Financial Resource Strain:  Not on file  Food Insecurity: Not on file  Transportation Needs: Not on file  Physical Activity: Not on file  Stress: Not on file  Social Connections: Not on file  Intimate Partner Violence: Not on file   Family History  Problem Relation Age of Onset   Heart attack Mother    Heart attack Father    Parkinson's disease Son     Objective: Office vital signs reviewed. BP (!) 116/56   Pulse 68   Temp (!) 97 F (36.1 C)   Ht 5\' 7"  (1.702 m)   Wt 154 lb 6.4 oz (70 kg)   SpO2 97%   BMI 24.18 kg/m   Physical Examination:  General: Awake, alert, well appearing elderly female, No acute distress Cardio: regular rate and rhythm, S1S2 heard, no murmurs appreciated Pulm: clear to auscultation bilaterally, no wheezes, rhonchi or rales; normal work of breathing on room air Extremities: warm, well perfused, nonpitting edema to bilateral lower extremities MSK: Slow, antalgic gait with valgus deformity noted of the knees  Assessment/ Plan: 87 y.o. female   Paroxysmal atrial fibrillation ([place]) - Plan: CoaguChek XS/INR Waived, POCT INR  S/P mitral valve repair - Plan: POCT INR  INR slightly supratherapeutic at 3.1 today.  Okay to add greens.  No changes to regimen.  See her back in 6 weeks and we will do her thyroid levels at that time as well  Orders Placed This Encounter  Procedures   CoaguChek XS/INR Waived   No orders of the defined types were placed in this encounter.    Melissa Norlander, DO [place] [phone]

## 2021-04-13 NOTE — Patient Instructions (Signed)
Slightly above goal but I think eating some greens will get you to a normal level.  No changes needed.  See you in 6 weeks

## 2021-04-14 LAB — COAGUCHEK XS/INR WAIVED
INR: 3.1 — ABNORMAL HIGH (ref 0.9–1.1)
Prothrombin Time: 37.3 s

## 2021-05-20 ENCOUNTER — Other Ambulatory Visit: Payer: Self-pay | Admitting: Family Medicine

## 2021-05-26 ENCOUNTER — Ambulatory Visit (INDEPENDENT_AMBULATORY_CARE_PROVIDER_SITE_OTHER): Payer: MEDICARE | Admitting: Family Medicine

## 2021-05-26 ENCOUNTER — Encounter: Payer: Self-pay | Admitting: Family Medicine

## 2021-05-26 VITALS — BP 128/80 | HR 91 | Temp 97.8°F | Ht 67.0 in | Wt 153.0 lb

## 2021-05-26 DIAGNOSIS — Z9889 Other specified postprocedural states: Secondary | ICD-10-CM

## 2021-05-26 DIAGNOSIS — R791 Abnormal coagulation profile: Secondary | ICD-10-CM | POA: Diagnosis not present

## 2021-05-26 DIAGNOSIS — N1831 Chronic kidney disease, stage 3a: Secondary | ICD-10-CM | POA: Diagnosis not present

## 2021-05-26 DIAGNOSIS — E039 Hypothyroidism, unspecified: Secondary | ICD-10-CM

## 2021-05-26 DIAGNOSIS — I480 Paroxysmal atrial fibrillation: Secondary | ICD-10-CM | POA: Diagnosis not present

## 2021-05-26 LAB — COAGUCHEK XS/INR WAIVED
INR: 3.1 — ABNORMAL HIGH (ref 0.9–1.1)
Prothrombin Time: 37 s

## 2021-05-26 LAB — POCT INR: INR: 3.1 — AB (ref 2–3)

## 2021-05-26 MED ORDER — WARFARIN SODIUM 2.5 MG PO TABS: ORAL_TABLET | ORAL | 3 refills | Status: DC

## 2021-05-26 NOTE — Progress Notes (Signed)
Subjective: CC: INR check PCP: Melissa Norlander, [name] NWG:NFAOZ [name] is a 87 y.o. female presenting to clinic today for:  1.  Chronic atrial fibrillation Patient is compliant with [name], [name].  She takes 1 tablet daily except for 2 tablets on Mondays and Fridays.  She has not changed diet at all since our last visit and continues to have a fair appetite.  No bleeding reported.  2.  Hypothyroidism Patient is compliant with Synthroid 25 mcg daily.  No reports of tremor, change in voice or change in bowel habits   ROS: Per HPI  Allergies  Allergen Reactions   Statins Other (See Comments)    Myalgia   Past Medical History:  Diagnosis Date   A-fib ([place])    Dyslipidemia    Hyperlipidemia    Hypothyroidism    2 degree amidarone    Pacemaker    PAF (paroxysmal atrial fibrillation) (HCC)    S/P mitral valve repair    SSS (sick sinus syndrome) (HCC)    Systemic hypertension     Current Outpatient Medications:    levothyroxine (SYNTHROID) 25 MCG tablet, TAKE 1 TABLET DAILY, Disp: 90 tablet, Rfl: 1   metoprolol tartrate ([name]) 50 MG tablet, TAKE 1 TABLET TWICE A DAY, Disp: 180 tablet, Rfl: 3   Polyethyl Glycol-Propyl Glycol (SYSTANE OP), Apply 1 drop to eye daily as needed (dry eyes)., Disp: , Rfl:    triamterene-hydrochlorothiazide (DYAZIDE) 37.5-25 MG capsule, TAKE 1 CAPSULE DAILY, Disp: 90 capsule, Rfl: 3   warfarin ([name]) 2.5 MG tablet, TAKE 2 TABLETS ON MONDAY AND FRIDAY, THEN 1 TABLET ALL OTHER DAYS OF THE WEEK, Disp: 150 tablet, Rfl: 0 Social History   Socioeconomic History   Marital status: Married    Spouse name: Not on file   Number of children: 3   Years of education: Not on file   Highest education level: High school graduate  Occupational History   Occupation: Retired  Tobacco Use   Smoking status: Never   Smokeless tobacco: Never  Vaping Use   Vaping Use: Never used  Substance and Sexual Activity   Alcohol use: No   Drug use: Never    Sexual activity: Not Currently  Other Topics Concern   Not on file  Social History Narrative   Not on file   Social Determinants of Health   Financial Resource Strain: Not on file  Food Insecurity: Not on file  Transportation Needs: Not on file  Physical Activity: Not on file  Stress: Not on file  Social Connections: Not on file  Intimate Partner Violence: Not on file   Family History  Problem Relation Age of Onset   Heart attack Mother    Heart attack Father    Parkinson's disease Son     Objective: Office vital signs reviewed. BP 128/80   Pulse 91   Temp 97.8 F (36.6 C)   Ht 5\' 7"  (1.702 m)   Wt 153 lb (69.4 kg)   SpO2 98%   BMI 23.96 kg/m   Physical Examination:  General: Awake, alert, nontoxic-appearing elderly female, No acute distress HEENT: No exophthalmos or goiter Cardio: Irregularly irregular with rate control.  S1S2 heard, no murmurs appreciated Pulm: clear to auscultation bilaterally, no wheezes, rhonchi or rales; normal work of breathing on room air Extremities: warm, well perfused, left lower extremity  1+ edema and right lower extremity with trace to +1 edema, no cyanosis or clubbing; +2 pulses bilaterally MSK: Valgus deformity of the knees.  Ambulates with use of cane.  Gait is somewhat unsteady  Assessment/ Plan: 87 y.o. female   Supratherapeutic INR - Plan: POCT INR  Paroxysmal atrial fibrillation (HCC) - Plan: CoaguChek XS/INR Waived, POCT INR  S/P mitral valve repair - Plan: CoaguChek XS/INR Waived, POCT INR  Acquired hypothyroidism - Plan: TSH, T4, Free, POCT INR  Stage 3a chronic kidney disease ([place]) - Plan: Renal Function Panel, CBC, POCT INR  INR remains slightly supratherapeutic with INR of 3.1 today.  I am advising her to reduce her [name] to 2.5 mg daily except on Friday she will take 5 mg.  May incorporate some greens into her diet this week  She is asymptomatic from a thyroid standpoint.  Check TSH, free T4  We will check  renal function along with her aforementioned labs  No orders of the defined types were placed in this encounter.  No orders of the defined types were placed in this encounter.    Melissa Norlander, [name] [place] [phone]

## 2021-05-26 NOTE — Patient Instructions (Signed)
Take ONE TABLET DAILY, EXCEPT on Fridays OK to take 2 tablets  OK to have some greens.

## 2021-05-27 LAB — CBC
Hematocrit: 37.9 % (ref 34.0–46.6)
Hemoglobin: 12.6 g/dL (ref 11.1–15.9)
MCH: 29.9 pg (ref 26.6–33.0)
MCHC: 33.2 g/dL (ref 31.5–35.7)
MCV: 90 fL (ref 79–97)
Platelets: 197 10*3/uL (ref 150–450)
RBC: 4.22 x10E6/uL (ref 3.77–5.28)
RDW: 13.8 % (ref 11.7–15.4)
WBC: 5.4 10*3/uL (ref 3.4–10.8)

## 2021-05-27 LAB — RENAL FUNCTION PANEL
Albumin: 4 g/dL (ref 3.6–4.6)
BUN/Creatinine Ratio: 20 (ref 12–28)
BUN: 20 mg/dL (ref 8–27)
CO2: 27 mmol/L (ref 20–29)
Calcium: 9.3 mg/dL (ref 8.7–10.3)
Chloride: 105 mmol/L (ref 96–106)
Creatinine, Ser: 0.98 mg/dL (ref 0.57–1.00)
Glucose: 89 mg/dL (ref 70–99)
Phosphorus: 3.3 mg/dL (ref 3.0–4.3)
Potassium: 3.8 mmol/L (ref 3.5–5.2)
Sodium: 146 mmol/L — ABNORMAL HIGH (ref 134–144)
eGFR: 55 mL/min/{1.73_m2} — ABNORMAL LOW (ref >59)

## 2021-05-27 LAB — TSH: TSH: 2.32 u[IU]/mL (ref 0.450–4.500)

## 2021-05-27 LAB — T4, FREE: Free T4: 1.03 ng/dL (ref 0.82–1.77)

## 2021-06-02 ENCOUNTER — Ambulatory Visit (INDEPENDENT_AMBULATORY_CARE_PROVIDER_SITE_OTHER): Payer: MEDICARE

## 2021-06-02 DIAGNOSIS — I495 Sick sinus syndrome: Secondary | ICD-10-CM

## 2021-06-02 LAB — CUP PACEART REMOTE DEVICE CHECK
Battery Remaining Longevity: 29 mo
Battery Voltage: 2.93 V
Brady Statistic AP VP Percent: 0.53 %
Brady Statistic AP VS Percent: 74.59 %
Brady Statistic AS VP Percent: 0.08 %
Brady Statistic AS VS Percent: 24.8 %
Brady Statistic RA Percent Paced: 76.36 %
Brady Statistic RV Percent Paced: 0.61 %
Date Time Interrogation Session: 20221206073720
Implantable Lead Implant Date: 20081126
Implantable Lead Implant Date: 20081126
Implantable Lead Location: 753859
Implantable Lead Location: 753860
Implantable Lead Model: 5076
Implantable Lead Model: 5076
Implantable Pulse Generator Implant Date: 20180228
Lead Channel Impedance Value: 304 Ohm
Lead Channel Impedance Value: 342 Ohm
Lead Channel Impedance Value: 342 Ohm
Lead Channel Impedance Value: 380 Ohm
Lead Channel Pacing Threshold Amplitude: 1.125 V
Lead Channel Pacing Threshold Pulse Width: 0.4 ms
Lead Channel Sensing Intrinsic Amplitude: 0.125 mV
Lead Channel Sensing Intrinsic Amplitude: 0.125 mV
Lead Channel Sensing Intrinsic Amplitude: 6.5 mV
Lead Channel Sensing Intrinsic Amplitude: 6.5 mV
Lead Channel Setting Pacing Amplitude: 3 V
Lead Channel Setting Pacing Amplitude: 3.5 V
Lead Channel Setting Pacing Pulse Width: 0.4 ms
Lead Channel Setting Sensing Sensitivity: 2 mV

## 2021-06-11 NOTE — Progress Notes (Signed)
Remote pacemaker transmission.   

## 2021-07-07 ENCOUNTER — Ambulatory Visit (INDEPENDENT_AMBULATORY_CARE_PROVIDER_SITE_OTHER): Payer: MEDICARE | Admitting: Family Medicine

## 2021-07-07 VITALS — BP 108/60 | HR 76 | Temp 97.8°F | Ht 66.0 in | Wt 152.0 lb

## 2021-07-07 DIAGNOSIS — Z7901 Long term (current) use of anticoagulants: Secondary | ICD-10-CM | POA: Diagnosis not present

## 2021-07-07 DIAGNOSIS — Z9889 Other specified postprocedural states: Secondary | ICD-10-CM | POA: Diagnosis not present

## 2021-07-07 DIAGNOSIS — I480 Paroxysmal atrial fibrillation: Secondary | ICD-10-CM

## 2021-07-07 DIAGNOSIS — F4321 Adjustment disorder with depressed mood: Secondary | ICD-10-CM | POA: Diagnosis not present

## 2021-07-07 LAB — COAGUCHEK XS/INR WAIVED
INR: 2.8 — ABNORMAL HIGH (ref 0.9–1.1)
Prothrombin Time: 34 s

## 2021-07-07 NOTE — Progress Notes (Signed)
Subjective: CC: A. fib follow-up PCP: [name], [name] IRJ:JOACZ [name] is a 88 y.o. female presenting to clinic today for:  1.  A. fib, history of mitral valve repair Goal INR 2-3. INR last visit was slightly supratherapeutic at 3.1.  She was advised to reduce her Coumadin to 2.5 mg daily except on Friday where she would take 5 mg.  She has not been eating her normal amounts because her husband passed away over the holidays.  She is grieving but feels well supported by her family.  Her son is close.  ROS: Per HPI  Allergies  Allergen Reactions   Statins Other (See Comments)    Myalgia   Past Medical History:  Diagnosis Date   A-fib ([place])    Dyslipidemia    Hyperlipidemia    Hypothyroidism    2 degree amidarone    Pacemaker    PAF (paroxysmal atrial fibrillation) (HCC)    S/P mitral valve repair    SSS (sick sinus syndrome) (HCC)    Systemic hypertension     Current Outpatient Medications:    levothyroxine (SYNTHROID) 25 MCG tablet, TAKE 1 TABLET DAILY, Disp: 90 tablet, Rfl: 1   metoprolol tartrate (LOPRESSOR) 50 MG tablet, TAKE 1 TABLET TWICE A DAY, Disp: 180 tablet, Rfl: 3   Polyethyl Glycol-Propyl Glycol (SYSTANE OP), Apply 1 drop to eye daily as needed (dry eyes)., Disp: , Rfl:    triamterene-hydrochlorothiazide (DYAZIDE) 37.5-25 MG capsule, TAKE 1 CAPSULE DAILY, Disp: 90 capsule, Rfl: 3   warfarin (COUMADIN) 2.5 MG tablet, 1 tablet by mouth daily except 2 tablets on Fridays., Disp: 90 tablet, Rfl: 3 Social History   Socioeconomic History   Marital status: Married    Spouse name: Not on file   Number of children: 3   Years of education: Not on file   Highest education level: High school graduate  Occupational History   Occupation: Retired  Tobacco Use   Smoking status: Never   Smokeless tobacco: Never  Vaping Use   Vaping Use: Never used  Substance and Sexual Activity   Alcohol use: No   Drug use: Never   Sexual activity: Not Currently  Other  Topics Concern   Not on file  Social History Narrative   Not on file   Social Determinants of Health   Financial Resource Strain: Not on file  Food Insecurity: Not on file  Transportation Needs: Not on file  Physical Activity: Not on file  Stress: Not on file  Social Connections: Not on file  Intimate Partner Violence: Not on file   Family History  Problem Relation Age of Onset   Heart attack Mother    Heart attack Father    Parkinson's disease Son     Objective: Office vital signs reviewed. BP 108/60    Pulse 76    Temp 97.8 F (36.6 C) (Temporal)    Ht 5\' 6"  (1.676 m)    Wt 152 lb (68.9 kg)    SpO2 96%    BMI 24.53 kg/m   Physical Examination:  General: Awake, alert, nontoxic-appearing female, tearful Psych: Depressed, tearful  Assessment/ Plan: 88 y.o. female   S/P mitral valve repair - Plan: CoaguChek XS/INR Waived  Paroxysmal atrial fibrillation ([place])  Anticoagulation monitoring, INR range 2-3  Grief reaction - Plan: AMB Referral to Community Care Coordinaton  INR therapeutic at 2.8 today.  No changes.  She certainly appears to be grieving and rightly so given the recent passing of her spouse.  I have reached out to CCM to see if perhaps they might check on her intervally and provide grief counseling should she desire.  She is aware that I have made this referral.  Would like to see her back in 6 to 8 weeks, sooner if concerns arise  Orders Placed This Encounter  Procedures   CoaguChek XS/INR Waived   No orders of the defined types were placed in this encounter.  Total time spent with patient 32 minutes.  Greater than 50% of encounter spent in coordination of care/counseling.   [name], [name] [place] [phone]

## 2021-07-08 ENCOUNTER — Telehealth: Payer: Self-pay

## 2021-07-08 NOTE — Chronic Care Management (AMB) (Signed)
°  Chronic Care Management   Note  07/08/2021 Name: [name] MRN: [id number] DOB: April 30, 1929  [name] is a 88 y.o. year old female who is a primary care patient of [name], DO. [name] is currently enrolled in care management services. An additional referral for LCSW was placed.   Follow up plan: Patient declines LCSW engagement by the care management team. Appropriate care team members and provider have been notified via electronic communication.   Noreene Larsson, [place], [place], [place] [address] Direct Dial: [phone] [email or web address]

## 2021-08-21 ENCOUNTER — Encounter: Payer: Self-pay | Admitting: Family Medicine

## 2021-08-21 ENCOUNTER — Ambulatory Visit: Payer: Self-pay | Admitting: Licensed Clinical Social Worker

## 2021-08-21 ENCOUNTER — Ambulatory Visit (INDEPENDENT_AMBULATORY_CARE_PROVIDER_SITE_OTHER): Payer: MEDICARE | Admitting: Family Medicine

## 2021-08-21 VITALS — BP 121/71 | HR 86 | Temp 97.2°F | Ht 66.0 in | Wt 152.0 lb

## 2021-08-21 DIAGNOSIS — Z7901 Long term (current) use of anticoagulants: Secondary | ICD-10-CM | POA: Diagnosis not present

## 2021-08-21 DIAGNOSIS — Z9889 Other specified postprocedural states: Secondary | ICD-10-CM | POA: Diagnosis not present

## 2021-08-21 DIAGNOSIS — F4321 Adjustment disorder with depressed mood: Secondary | ICD-10-CM | POA: Diagnosis not present

## 2021-08-21 DIAGNOSIS — I480 Paroxysmal atrial fibrillation: Secondary | ICD-10-CM

## 2021-08-21 LAB — COAGUCHEK XS/INR WAIVED
INR: 2 — ABNORMAL HIGH (ref 0.9–1.1)
Prothrombin Time: 24.1 s

## 2021-08-21 NOTE — Progress Notes (Signed)
Subjective: CC:INR check PCP: [name], DO BJY:[id number] [name] is a 88 y.o. female presenting to clinic today for:  1. INR check Compliant with Coumadin.  She reports appetite has been "too good"  She has had several people bringing her cover dishes after her husband's death.  She continues to mourn him of course.  She feels like she has good support from her family.  She has not received a call from clinical social work for grief counseling.  No bleeding reported.   ROS: Per HPI  Allergies  Allergen Reactions   Statins Other (See Comments)    Myalgia   Past Medical History:  Diagnosis Date   A-fib ([place])    Dyslipidemia    Hyperlipidemia    Hypothyroidism    2 degree amidarone    Pacemaker    PAF (paroxysmal atrial fibrillation) (HCC)    S/P mitral valve repair    SSS (sick sinus syndrome) (HCC)    Systemic hypertension     Current Outpatient Medications:    levothyroxine (SYNTHROID) 25 MCG tablet, TAKE 1 TABLET DAILY, Disp: 90 tablet, Rfl: 1   metoprolol tartrate (LOPRESSOR) 50 MG tablet, TAKE 1 TABLET TWICE A DAY, Disp: 180 tablet, Rfl: 3   Polyethyl Glycol-Propyl Glycol (SYSTANE OP), Apply 1 drop to eye daily as needed (dry eyes)., Disp: , Rfl:    triamterene-hydrochlorothiazide (DYAZIDE) 37.5-25 MG capsule, TAKE 1 CAPSULE DAILY, Disp: 90 capsule, Rfl: 3   warfarin (COUMADIN) 2.5 MG tablet, 1 tablet by mouth daily except 2 tablets on Fridays., Disp: 90 tablet, Rfl: 3 Social History   Socioeconomic History   Marital status: Married    Spouse name: Not on file   Number of children: 3   Years of education: Not on file   Highest education level: High school graduate  Occupational History   Occupation: Retired  Tobacco Use   Smoking status: Never   Smokeless tobacco: Never  Vaping Use   Vaping Use: Never used  Substance and Sexual Activity   Alcohol use: No   Drug use: Never   Sexual activity: Not Currently  Other Topics Concern   Not on file   Social History Narrative   Not on file   Social Determinants of Health   Financial Resource Strain: Not on file  Food Insecurity: Not on file  Transportation Needs: Not on file  Physical Activity: Not on file  Stress: Not on file  Social Connections: Not on file  Intimate Partner Violence: Not on file   Family History  Problem Relation Age of Onset   Heart attack Mother    Heart attack Father    Parkinson's disease Son     Objective: Office vital signs reviewed. BP 121/71    Pulse 86    Temp (!) 97.2 F (36.2 C)    Ht 5\' 6"  (1.676 m)    Wt 152 lb (68.9 kg)    SpO2 96%    BMI 24.53 kg/m   Physical Examination:  General: Awake, alert, chronically ill-appearing female, No acute distress Cardio: regular rate and rhythm, S1S2 heard, no murmurs appreciated Pulm: clear to auscultation bilaterally, no wheezes, rhonchi or rales; normal work of breathing on room air Psych: Mood depressed  Depression screen [facility] 2/9 08/21/2021 07/07/2021 05/26/2021  Decreased Interest 0 0 0  Down, Depressed, Hopeless 0 0 0  PHQ - 2 Score 0 0 0  Altered sleeping - - -  Tired, decreased energy - - -  Change in  appetite - - -  Feeling bad or failure about yourself  - - -  Trouble concentrating - - -  Moving slowly or fidgety/restless - - -  Suicidal thoughts - - -  PHQ-9 Score - - -  Some recent data might be hidden   GAD 7 : Generalized Anxiety Score 08/21/2021 05/26/2021 03/18/2021 03/10/2021  Nervous, Anxious, on Edge 0 0 0 0  Control/stop worrying 0 0 0 0  Worry too much - different things 0 0 0 0  Trouble relaxing 0 0 0 0  Restless 0 0 0 0  Easily annoyed or irritable 0 0 0 0  Afraid - awful might happen 0 0 0 0  Total GAD 7 Score 0 0 0 0  Anxiety Difficulty Not difficult at all Not difficult at all Not difficult at all Not difficult at all    Assessment/ Plan: 88 y.o. female   Anticoagulation monitoring, INR range 2-3 - Plan: CoaguChek XS/INR Waived  Paroxysmal atrial fibrillation  (HCC)  S/P mitral valve repair  Grief reaction  INR therapeutic.  No changes.    I have already reached out to CCM and the clinical notes indicate that they were going to reach out to her but I see no documentation of any attempts.  I will reach out to them again.    Okay to follow-up in 6 to 8 weeks  Orders Placed This Encounter  Procedures   CoaguChek XS/INR Waived   No orders of the defined types were placed in this encounter.    [name], DO [place] [phone]

## 2021-08-21 NOTE — Chronic Care Management (AMB) (Signed)
°  Chronic Care Management    Note   08/21/21   Name: [name]         MRN: [id number]       DOB: 06-14-1929   [name] is a 88 y.o. year old female who is a primary care patient of [name], DO. ARWILDA GEORGIA is currently enrolled in care management services. An additional referral for LCSW was placed.   Noreene Larsson, [place] called client on 07/08/21 to talk with Reeya about LCSW support. At that time, on 07/08/21, client did decline LCSW services.  Dr. Lajuana Ripple has referred client again for LCSW services. LCSW will be glad to contact client for support for counseling but client has to be willing to talk to LCSW, has to give consent for LCSW to  work with her.  Perhaps medical team can work with client more to discuss role of CCM support with LCSW.  Norva Riffle.Kyros Salzwedel MSW, [place] [employer] representative [facility] Care Management [phone]

## 2021-09-01 ENCOUNTER — Ambulatory Visit (INDEPENDENT_AMBULATORY_CARE_PROVIDER_SITE_OTHER): Payer: MEDICARE

## 2021-09-01 DIAGNOSIS — I495 Sick sinus syndrome: Secondary | ICD-10-CM | POA: Diagnosis not present

## 2021-09-01 LAB — CUP PACEART REMOTE DEVICE CHECK
Battery Remaining Longevity: 26 mo
Battery Voltage: 2.92 V
Brady Statistic AP VP Percent: 0.28 %
Brady Statistic AP VS Percent: 78.74 %
Brady Statistic AS VP Percent: 0.04 %
Brady Statistic AS VS Percent: 20.93 %
Brady Statistic RA Percent Paced: 79.55 %
Brady Statistic RV Percent Paced: 0.43 %
Date Time Interrogation Session: 20230307004258
Implantable Lead Implant Date: 20081126
Implantable Lead Implant Date: 20081126
Implantable Lead Location: 753859
Implantable Lead Location: 753860
Implantable Lead Model: 5076
Implantable Lead Model: 5076
Implantable Pulse Generator Implant Date: 20180228
Lead Channel Impedance Value: 342 Ohm
Lead Channel Impedance Value: 342 Ohm
Lead Channel Impedance Value: 380 Ohm
Lead Channel Impedance Value: 399 Ohm
Lead Channel Pacing Threshold Amplitude: 1 V
Lead Channel Pacing Threshold Pulse Width: 0.4 ms
Lead Channel Sensing Intrinsic Amplitude: 0.125 mV
Lead Channel Sensing Intrinsic Amplitude: 0.125 mV
Lead Channel Sensing Intrinsic Amplitude: 8 mV
Lead Channel Sensing Intrinsic Amplitude: 8 mV
Lead Channel Setting Pacing Amplitude: 2.75 V
Lead Channel Setting Pacing Amplitude: 3.5 V
Lead Channel Setting Pacing Pulse Width: 0.4 ms
Lead Channel Setting Sensing Sensitivity: 2 mV

## 2021-09-14 ENCOUNTER — Ambulatory Visit (INDEPENDENT_AMBULATORY_CARE_PROVIDER_SITE_OTHER): Payer: MEDICARE

## 2021-09-14 VITALS — Wt 152.0 lb

## 2021-09-14 DIAGNOSIS — Z0000 Encounter for general adult medical examination without abnormal findings: Secondary | ICD-10-CM

## 2021-09-14 NOTE — Progress Notes (Signed)
? ?Subjective:  ? [name] is a 88 y.o. female who presents for Medicare Annual (Subsequent) preventive examination. ? ?Virtual Visit via Telephone Note ? ?I connected with  NEVADA KIRCHNER on 09/14/21 at  1:15 PM EDT by telephone and verified that I am speaking with the correct person using two identifiers. ? ?Location: ?Patient: Home ?Provider: [facility] ?Persons participating in the virtual visit: patient/Nurse Health Advisor ?  ?I discussed the limitations, risks, security and privacy concerns of performing an evaluation and management service by telephone and the availability of in person appointments. The patient expressed understanding and agreed to proceed. ? ?Interactive audio and video telecommunications were attempted between this nurse and patient, however failed, due to patient having technical difficulties OR patient did not have access to video capability.  We continued and completed visit with audio only. ? ?Some vital signs may be absent or patient reported.  ? ?Karsyn Rochin Dionne Ano, LPN  ? ?Review of Systems    ? ?Cardiac Risk Factors include: advanced age (>26mn, >>26women);hypertension;sedentary lifestyle;dyslipidemia;Other (see comment), Risk factor comments: A.Fib, SSS, thrombophylia, S/P pacemaker, S/P mitral valve repair ? ?   ?Objective:  ?  ?Today's Vitals  ? 09/14/21 1313  ?Weight: 152 lb (68.9 kg)  ? ?Body mass index is 24.53 kg/m?. ? ?Advanced Directives 09/14/2021 09/11/2020 01/17/2019 04/29/2018 08/25/2016  ?Does Patient Have a Medical Advance Directive? No No No No No  ?Would patient like information on creating a medical advance directive? Yes (MAU/Ambulatory/Procedural Areas - Information given) No - Patient declined No - Patient declined - No - Patient declined  ? ? ?Current Medications (verified) ?Outpatient Encounter Medications as of 09/14/2021  ?Medication Sig  ? levothyroxine (SYNTHROID) 25 MCG tablet TAKE 1 TABLET DAILY  ? metoprolol tartrate (LOPRESSOR) 50 MG tablet TAKE 1 TABLET  TWICE A DAY  ? Polyethyl Glycol-Propyl Glycol (SYSTANE OP) Apply 1 drop to eye daily as needed (dry eyes).  ? triamterene-hydrochlorothiazide (DYAZIDE) 37.5-25 MG capsule TAKE 1 CAPSULE DAILY  ? warfarin (COUMADIN) 2.5 MG tablet 1 tablet by mouth daily except 2 tablets on Fridays.  ? ?No facility-administered encounter medications on file as of 09/14/2021.  ? ? ?Allergies (verified) ?Statins  ? ?History: ?Past Medical History:  ?Diagnosis Date  ? A-fib ([place]   ? Dyslipidemia   ? Hyperlipidemia   ? Hypothyroidism   ? 2 degree amidarone   ? Pacemaker   ? PAF (paroxysmal atrial fibrillation) ([facility]   ? S/P mitral valve repair   ? SSS (sick sinus syndrome) ([place]   ? Systemic hypertension   ? ?Past Surgical History:  ?Procedure Laterality Date  ? ABDOMINAL HYSTERECTOMY  1989  ? CARDIAC CATHETERIZATION  01/11/2008  ? mild-mod pulm. hypertension,normal coronaries  ? MINIMALLY INVASIVE MAZE PROCEDURE  02/2008  ? MITRAL VALVE REPAIR  September 2009  ? edwards physio ring annuloplasty  ? NM MYOVIEW LTD  03/28/2007  ? no ischemia  ? PACEMAKER PLACEMENT  November 2008  ? PPM GENERATOR CHANGEOUT N/A 08/25/2016  ? Procedure: PPM Generator Changeout - Battery Only;  Surgeon: MSanda Klein MD;  [facility];  Service: Cardiovascular;  Laterality: N/A;  ? [place]ARDIOGRAPHY  03/07/2012  ? mild LVH,EF =>55%,mild MR,mod TR,trace AI,right atrium mod. dilated  ? ?Family History  ?Problem Relation Age of Onset  ? Heart attack Mother   ? Heart attack Father   ? Parkinson's disease Son   ? ?Social History  ? ?Socioeconomic History  ? Marital status: Widowed  ?  Spouse name: Not on file  ? Number of children: 3  ? Years of education: Not on file  ? Highest education level: High school graduate  ?Occupational History  ? Occupation: Retired  ?Tobacco Use  ? Smoking status: Never  ? Smokeless tobacco: Never  ?Vaping Use  ? Vaping Use: Never used  ?Substance and Sexual Activity  ? Alcohol use: No  ? Drug use: Never  ? Sexual  activity: Not Currently  ?Other Topics Concern  ? Not on file  ?Social History Narrative  ? Husband passed 06/29/2021  ? 2 sons live with her  ? ?Social Determinants of Health  ? ?Financial Resource Strain: Low Risk   ? Difficulty of Paying Living Expenses: Not hard at all  ?Food Insecurity: No Food Insecurity  ? Worried About Charity fundraiser in the Last Year: Never true  ? Ran Out of Food in the Last Year: Never true  ?Transportation Needs: No Transportation Needs  ? Lack of Transportation (Medical): No  ? Lack of Transportation (Non-Medical): No  ?Physical Activity: Insufficiently Active  ? Days of Exercise per Week: 7 days  ? Minutes of Exercise per Session: 10 min  ?Stress: No Stress Concern Present  ? Feeling of Stress : Only a little  ?Social Connections: Socially Isolated  ? Frequency of Communication with Friends and Family: More than three times a week  ? Frequency of Social Gatherings with Friends and Family: More than three times a week  ? Attends Religious Services: Never  ? Active Member of Clubs or Organizations: No  ? Attends Archivist Meetings: Never  ? Marital Status: Widowed  ? ? ?Tobacco Counseling ?Counseling given: Not Answered ? ? ?Clinical Intake: ? ?Pre-visit preparation completed: Yes ? ?Pain : No/denies pain ? ?  ? ?BMI - recorded: 24.53 ?Nutritional Status: BMI of 19-24  Normal ?Nutritional Risks: None ?Diabetes: No ? ?How often do you need to have someone help you when you read instructions, pamphlets, or other written materials from your doctor or pharmacy?: 1 - Never ? ?Diabetic? no ? ?Interpreter Needed?: No ? ?Information entered by :: Angles Trevizo, LPN ? ? ?Activities of Daily Living ?In your present state of health, do you have any difficulty performing the following activities: 09/14/2021  ?Hearing? N  ?Vision? N  ?Difficulty concentrating or making decisions? N  ?Walking or climbing stairs? N  ?Dressing or bathing? N  ?Doing errands, shopping? Y  ?Comment she doesn't  drive  ?Preparing Food and eating ? N  ?Using the Toilet? N  ?In the past six months, have you accidently leaked urine? Y  ?Comment mild  ?Do you have problems with loss of bowel control? N  ?Managing your Medications? N  ?Managing your Finances? N  ?Housekeeping or managing your Housekeeping? N  ?Some recent data might be hidden  ? ? ?Patient Care Team: ?[name], DO as PCP - General (Family Medicine) ?Katha Cabal, LCSW as [facility] [employer] (Licensed [employer] representative) ?Croitoru, Dani Gobble, MD as Consulting Physician (Cardiology) ?Pixie Casino, MD as Consulting Physician (Cardiology) ? ?Indicate any recent Medical Services you may have received from other than [facility] providers in the past year (date may be approximate). ? ?   ?Assessment:  ? This is a routine wellness examination for [facility]. ? ?Hearing/Vision screen ?Hearing Screening - Comments:: Denies hearing difficulties   ?Vision Screening - Comments:: Wears rx glasses - up to date with routine eye exams with [facility] in [place] ? ?Dietary issues and exercise activities discussed: ?Current Exercise Habits: Home exercise routine, Type of exercise: walking, Time (Minutes): 10, Frequency (Times/Week): 7, Weekly Exercise (Minutes/Week): 70, Intensity: Mild, Exercise limited by: orthopedic condition(s);cardiac condition(s) ? ? Goals Addressed   ? ?  ?  ?  ?  ? This Visit's Progress  ?  AWV   Not on track  ?  09/14/2021 ?AWV Goal: Exercise for General Health ? ?Patient will verbalize understanding of the benefits of increased physical activity: ?Exercising regularly is important. It will improve your overall fitness, flexibility, and endurance. ?Regular exercise also will improve your overall health. It can help you control your weight, reduce stress, and improve your bone density. ?Over the next year, patient will increase physical activity as tolerated with a goal of at least 150 minutes of moderate physical  activity per week.  ?You can tell that you are exercising at a moderate intensity if your heart starts beating faster and you start breathing faster but can still hold a conversation. ?Moderate-intensity exercise ideas

## 2021-09-14 NOTE — Patient Instructions (Signed)
[name] , ?Thank you for taking time to come for your Medicare Wellness Visit. I appreciate your ongoing commitment to your health goals. Please review the following plan we discussed and let me know if I can assist you in the future.  ? ?Screening recommendations/referrals: ?Colonoscopy: Done 07/12/2006 - no repeat required ?Mammogram: Done 2017 - no repeat required ?Bone Density: Done 05/21/2016 - may repeat every 2 years, but not required ?Recommended yearly ophthalmology/optometry visit for glaucoma screening and checkup ?Recommended yearly dental visit for hygiene and checkup ? ?Vaccinations: ?Influenza vaccine: Declined - recommended annually in fall ?Pneumococcal vaccine: [id number]-06 done 04/29/2020; due for Pneumovax-23 ?Tdap vaccine: Due - recommended every 10 years ?Shingles vaccine: Due - Shingrix is 2 doses 2-6 months apart and over 90% effective     ?Covid-19: Done 09/11/2019 & 10/08/2019 - declines boosters ? ?Advanced directives: Advance directive discussed with you today. I have provided a copy for you to complete at home and have notarized. Once this is complete please bring a copy in to our office so we can scan it into your chart.  ? ?Conditions/risks identified: Aim for 30 minutes of exercise or brisk walking, 6-8 glasses of water, and 5 servings of fruits and vegetables each day.  ? ?Next appointment: Follow up in one year for your annual wellness visit  ? ? ?Preventive Care 25 Years and Older, Female ?Preventive care refers to lifestyle choices and visits with your health care provider that can promote health and wellness. ?What does preventive care include? ?A yearly physical exam. This is also called an annual well check. ?Dental exams once or twice a year. ?Routine eye exams. Ask your health care provider how often you should have your eyes checked. ?Personal lifestyle choices, including: ?Daily care of your teeth and gums. ?Regular physical activity. ?Eating a healthy diet. ?Avoiding tobacco  and drug use. ?Limiting alcohol use. ?Practicing safe sex. ?Taking low-dose aspirin every day. ?Taking vitamin and mineral supplements as recommended by your health care provider. ?What happens during an annual well check? ?The services and screenings done by your health care provider during your annual well check will depend on your age, overall health, lifestyle risk factors, and family history of disease. ?Counseling  ?Your health care provider may ask you questions about your: ?Alcohol use. ?Tobacco use. ?Drug use. ?Emotional well-being. ?Home and relationship well-being. ?Sexual activity. ?Eating habits. ?History of falls. ?Memory and ability to understand (cognition). ?Work and work Statistician. ?Reproductive health. ?Screening  ?You may have the following tests or measurements: ?Height, weight, and BMI. ?Blood pressure. ?Lipid and cholesterol levels. These may be checked every 5 years, or more frequently if you are over 56 years old. ?Skin check. ?Lung cancer screening. You may have this screening every year starting at age 48 if you have a 30-pack-year history of smoking and currently smoke or have quit within the past 15 years. ?Fecal occult blood test (FOBT) of the stool. You may have this test every year starting at age 34. ?Flexible sigmoidoscopy or colonoscopy. You may have a sigmoidoscopy every 5 years or a colonoscopy every 10 years starting at age 44. ?Hepatitis C blood test. ?Hepatitis B blood test. ?Sexually transmitted disease (STD) testing. ?Diabetes screening. This is done by checking your blood sugar (glucose) after you have not eaten for a while (fasting). You may have this done every 1-3 years. ?Bone density scan. This is done to screen for osteoporosis. You may have this done starting at age 29. ?Mammogram. This may be  done every 1-2 years. Talk to your health care provider about how often you should have regular mammograms. ?Talk with your health care provider about your test results,  treatment options, and if necessary, the need for more tests. ?Vaccines  ?Your health care provider may recommend certain vaccines, such as: ?Influenza vaccine. This is recommended every year. ?Tetanus, diphtheria, and acellular pertussis (Tdap, Td) vaccine. You may need a Td booster every 10 years. ?Zoster vaccine. You may need this after age 81. ?Pneumococcal 13-valent conjugate (PCV13) vaccine. One dose is recommended after age 52. ?Pneumococcal polysaccharide (PPSV23) vaccine. One dose is recommended after age 10. ?Talk to your health care provider about which screenings and vaccines you need and how often you need them. ?This information is not intended to replace advice given to you by your health care provider. Make sure you discuss any questions you have with your health care provider. ?Document Released: 07/11/2015 Document Revised: 03/03/2016 Document Reviewed: 04/15/2015 ?Elsevier Interactive Patient Education ? 2017 [place]. ? ?Fall Prevention in the Home ?Falls can cause injuries. They can happen to people of all ages. There are many things you can do to make your home safe and to help prevent falls. ?What can I do on the outside of my home? ?Regularly fix the edges of walkways and driveways and fix any cracks. ?Remove anything that might make you trip as you walk through a door, such as a raised step or threshold. ?Trim any bushes or trees on the path to your home. ?Use bright outdoor lighting. ?Clear any walking paths of anything that might make someone trip, such as rocks or tools. ?Regularly check to see if handrails are loose or broken. Make sure that both sides of any steps have handrails. ?Any raised decks and porches should have guardrails on the edges. ?Have any leaves, snow, or ice cleared regularly. ?Use sand or salt on walking paths during winter. ?Clean up any spills in your garage right away. This includes oil or grease spills. ?What can I do in the bathroom? ?Use night  lights. ?Install grab bars by the toilet and in the tub and shower. Do not use towel bars as grab bars. ?Use non-skid mats or decals in the tub or shower. ?If you need to sit down in the shower, use a plastic, non-slip stool. ?Keep the floor dry. Clean up any water that spills on the floor as soon as it happens. ?Remove soap buildup in the tub or shower regularly. ?Attach bath mats securely with double-sided non-slip rug tape. ?Do not have throw rugs and other things on the floor that can make you trip. ?What can I do in the bedroom? ?Use night lights. ?Make sure that you have a light by your bed that is easy to reach. ?Do not use any sheets or blankets that are too big for your bed. They should not hang down onto the floor. ?Have a firm chair that has side arms. You can use this for support while you get dressed. ?Do not have throw rugs and other things on the floor that can make you trip. ?What can I do in the kitchen? ?Clean up any spills right away. ?Avoid walking on wet floors. ?Keep items that you use a lot in easy-to-reach places. ?If you need to reach something above you, use a strong step stool that has a grab bar. ?Keep electrical cords out of the way. ?Do not use floor polish or wax that makes floors slippery. If you must use wax,  use non-skid floor wax. ?Do not have throw rugs and other things on the floor that can make you trip. ?What can I do with my stairs? ?Do not leave any items on the stairs. ?Make sure that there are handrails on both sides of the stairs and use them. Fix handrails that are broken or loose. Make sure that handrails are as long as the stairways. ?Check any carpeting to make sure that it is firmly attached to the stairs. Fix any carpet that is loose or worn. ?Avoid having throw rugs at the top or bottom of the stairs. If you do have throw rugs, attach them to the floor with carpet tape. ?Make sure that you have a light switch at the top of the stairs and the bottom of the stairs. If  you do not have them, ask someone to add them for you. ?What else can I do to help prevent falls? ?Wear shoes that: ?Do not have high heels. ?Have rubber bottoms. ?Are comfortable and fit you well. ?Are closed at the

## 2021-09-14 NOTE — Progress Notes (Signed)
Remote pacemaker transmission.   

## 2021-10-08 ENCOUNTER — Encounter: Payer: Self-pay | Admitting: Family Medicine

## 2021-10-08 ENCOUNTER — Ambulatory Visit (INDEPENDENT_AMBULATORY_CARE_PROVIDER_SITE_OTHER): Payer: MEDICARE | Admitting: Family Medicine

## 2021-10-08 VITALS — BP 130/65 | HR 70 | Temp 97.8°F | Ht 66.0 in | Wt 152.0 lb

## 2021-10-08 DIAGNOSIS — S39012A Strain of muscle, fascia and tendon of lower back, initial encounter: Secondary | ICD-10-CM | POA: Diagnosis not present

## 2021-10-08 DIAGNOSIS — Z9889 Other specified postprocedural states: Secondary | ICD-10-CM | POA: Diagnosis not present

## 2021-10-08 DIAGNOSIS — I480 Paroxysmal atrial fibrillation: Secondary | ICD-10-CM

## 2021-10-08 DIAGNOSIS — F4321 Adjustment disorder with depressed mood: Secondary | ICD-10-CM | POA: Diagnosis not present

## 2021-10-08 DIAGNOSIS — Z7901 Long term (current) use of anticoagulants: Secondary | ICD-10-CM

## 2021-10-08 LAB — COAGUCHEK XS/INR WAIVED
INR: 2 — ABNORMAL HIGH (ref 0.9–1.1)
Prothrombin Time: 23.8 s

## 2021-10-08 LAB — HEMOGLOBIN, FINGERSTICK: Hemoglobin: 12.1 g/dL (ref 11.1–15.9)

## 2021-10-08 NOTE — Progress Notes (Signed)
? ?Subjective: ?[name] ?PCP: Melissa Norlander, [name] ?[name] is a 88 y.o. female presenting to clinic today for: ? ?1. Afib/ MVRepair ?Patient is that she has been doing fairly well.  She unfortunately did strain her low back helping her son, who suffers from Parkinson's disease, put on his clothes today.  She does not feel like it is that bad and does not want any medications for it.  She is compliant with her Coumadin and does not report any heart palpitations or bleeding.  Continues to work through cleaning out her husband's belongings and this has been difficult for her.  She also notes that she lost her younger sister since her last visit.  She apparently died from complications of polio.  She is not interested in counseling and feels like she has good support from her family. ? ? ?ROS: Per HPI ? ?Allergies  ?Allergen Reactions  ? Statins Other (See Comments)  ?  Myalgia  ? ?Past Medical History:  ?Diagnosis Date  ? A-fib ([place])   ? Dyslipidemia   ? Hyperlipidemia   ? Hypothyroidism   ? 2 degree amidarone   ? Pacemaker   ? PAF (paroxysmal atrial fibrillation) ([place])   ? S/P mitral valve repair   ? SSS (sick sinus syndrome) ([place])   ? Systemic hypertension   ? ? ?Current Outpatient Medications:  ?  levothyroxine (SYNTHROID) 25 MCG tablet, TAKE 1 TABLET DAILY, Disp: 90 tablet, Rfl: 1 ?  metoprolol tartrate (LOPRESSOR) 50 MG tablet, TAKE 1 TABLET TWICE A DAY, Disp: 180 tablet, Rfl: 3 ?  Polyethyl Glycol-Propyl Glycol (SYSTANE OP), Apply 1 drop to eye daily as needed (dry eyes)., Disp: , Rfl:  ?  triamterene-hydrochlorothiazide (DYAZIDE) 37.5-25 MG capsule, TAKE 1 CAPSULE DAILY, Disp: 90 capsule, Rfl: 3 ?  warfarin (COUMADIN) 2.5 MG tablet, 1 tablet by mouth daily except 2 tablets on Fridays., Disp: 90 tablet, Rfl: 3 ?Social History  ? ?Socioeconomic History  ? Marital status: Widowed  ?  Spouse name: Not on file  ? Number of children: 3  ? Years of education: Not on file  ? Highest education level:  High school graduate  ?Occupational History  ? Occupation: Retired  ?Tobacco Use  ? Smoking status: Never  ? Smokeless tobacco: Never  ?Vaping Use  ? Vaping Use: Never used  ?Substance and Sexual Activity  ? Alcohol use: No  ? Drug use: Never  ? Sexual activity: Not Currently  ?Other Topics Concern  ? Not on file  ?Social History Narrative  ? Husband passed 06/29/2021  ? 2 sons live with her  ? ?Social Determinants of Health  ? ?Financial Resource Strain: Low Risk   ? Difficulty of Paying Living Expenses: Not hard at all  ?Food Insecurity: No Food Insecurity  ? Worried About Charity fundraiser in the Last Year: Never true  ? Ran Out of Food in the Last Year: Never true  ?Transportation Needs: No Transportation Needs  ? Lack of Transportation (Medical): No  ? Lack of Transportation (Non-Medical): No  ?Physical Activity: Insufficiently Active  ? Days of Exercise per Week: 7 days  ? Minutes of Exercise per Session: 10 min  ?Stress: No Stress Concern Present  ? Feeling of Stress : Only a little  ?Social Connections: Socially Isolated  ? Frequency of Communication with Friends and Family: More than three times a week  ? Frequency of Social Gatherings with Friends and Family: More than three times a week  ? Attends  Religious Services: Never  ? Active Member of Clubs or Organizations: No  ? Attends Archivist Meetings: Never  ? Marital Status: Widowed  ?Intimate Partner Violence: Not At Risk  ? Fear of Current or Ex-Partner: No  ? Emotionally Abused: No  ? Physically Abused: No  ? Sexually Abused: No  ? ?Family History  ?Problem Relation Age of Onset  ? Heart attack Mother   ? Heart attack Father   ? Parkinson's disease Son   ? ? ?Objective: ?Office vital signs reviewed. ?BP 130/65   Pulse 70   Temp 97.8 ?F (36.6 ?C)   Ht '5\' 6"'$  (1.676 m)   Wt 152 lb (68.9 kg)   SpO2 99%   BMI 24.53 kg/m?  ? ?Physical Examination:  ?General: Awake, alert, nontoxic elderly female, No acute distress ?HEENT: Sclera white.  Moist  mucous membranes ?Cardio: regular rate and rhythm, S1S2 heard, no murmurs appreciated ?Pulm: clear to auscultation bilaterally, no wheezes, rhonchi or rales; normal work of breathing on room air ?MSK: Arrives in wheelchair today ?Psych: Intermittently tearful ? ? ?  10/08/2021  ? 10:08 AM 09/14/2021  ?  1:17 PM 08/21/2021  ? 10:17 AM  ?Depression screen PHQ 2/9  ?Decreased Interest 0 0 0  ?Down, Depressed, Hopeless 0 1 0  ?PHQ - 2 Score 0 1 0  ? ? ?Assessment/ Plan: ?88 y.o. female  ? ?Paroxysmal atrial fibrillation (HCC) - Plan: Hemoglobin, fingerstick, CoaguChek XS/INR Waived ? ?Anticoagulation monitoring, INR range 2-3 - Plan: Hemoglobin, fingerstick, CoaguChek XS/INR Waived ? ?S/P mitral valve repair - Plan: CoaguChek XS/INR Waived ? ?Grief reaction ? ?Back strain, initial encounter ? ?INR is therapeutic at 2.0 today.  No evidence of anemia with hemoglobin check.  No changes.  May follow-up in 6 to 8 weeks ? ?Continues to grieve the loss of her husband and now her sister, who suffered from polio.  She does not wish for any medications or counseling services. ? ?Offered Depo-Medrol shot today for her back strain but she declines.  She will use heat ? ?No orders of the defined types were placed in this encounter. ? ?No orders of the defined types were placed in this encounter. ? ? ? ?Melissa Norlander, [name] ?[place] ?[phone] ? ?

## 2021-11-11 ENCOUNTER — Encounter: Payer: Self-pay | Admitting: Family Medicine

## 2021-11-11 ENCOUNTER — Ambulatory Visit (INDEPENDENT_AMBULATORY_CARE_PROVIDER_SITE_OTHER): Payer: MEDICARE | Admitting: Family Medicine

## 2021-11-11 VITALS — BP 135/79 | HR 76 | Temp 97.6°F | Ht 66.0 in | Wt 152.0 lb

## 2021-11-11 DIAGNOSIS — Z7901 Long term (current) use of anticoagulants: Secondary | ICD-10-CM | POA: Diagnosis not present

## 2021-11-11 DIAGNOSIS — I480 Paroxysmal atrial fibrillation: Secondary | ICD-10-CM | POA: Diagnosis not present

## 2021-11-11 DIAGNOSIS — Z9889 Other specified postprocedural states: Secondary | ICD-10-CM | POA: Diagnosis not present

## 2021-11-11 DIAGNOSIS — E039 Hypothyroidism, unspecified: Secondary | ICD-10-CM

## 2021-11-11 DIAGNOSIS — N1831 Chronic kidney disease, stage 3a: Secondary | ICD-10-CM | POA: Diagnosis not present

## 2021-11-11 LAB — COAGUCHEK XS/INR WAIVED
INR: 1.9 — ABNORMAL HIGH (ref 0.9–1.1)
Prothrombin Time: 22.4 s

## 2021-11-11 LAB — POCT INR: INR: 1.9 — AB (ref 2–3)

## 2021-11-11 NOTE — Progress Notes (Signed)
? ?Subjective: ?CC: INR check ?PCP: [name], DO ?[id number] [name] is a 88 y.o. female presenting to clinic today for: ? ?1.  Atrial fibrillation with history of MVR ?INR goal is 2-3 ?Patient takes 2.5 mg daily except for 5 mg on Fridays.  No reports of bleeding.  No reports of heart palpitations or falls. ? ? ?ROS: Per HPI ? ?Allergies  ?Allergen Reactions  ? Statins Other (See Comments)  ?  Myalgia  ? ?Past Medical History:  ?Diagnosis Date  ? A-fib ([place])   ? Dyslipidemia   ? Hyperlipidemia   ? Hypothyroidism   ? 2 degree amidarone   ? Pacemaker   ? PAF (paroxysmal atrial fibrillation) ([place])   ? S/P mitral valve repair   ? SSS (sick sinus syndrome) ([place])   ? Systemic hypertension   ? ? ?Current Outpatient Medications:  ?  levothyroxine (SYNTHROID) 25 MCG tablet, TAKE 1 TABLET DAILY, Disp: 90 tablet, Rfl: 1 ?  metoprolol tartrate (LOPRESSOR) 50 MG tablet, TAKE 1 TABLET TWICE A DAY, Disp: 180 tablet, Rfl: 3 ?  Polyethyl Glycol-Propyl Glycol (SYSTANE OP), Apply 1 drop to eye daily as needed (dry eyes)., Disp: , Rfl:  ?  triamterene-hydrochlorothiazide (DYAZIDE) 37.5-25 MG capsule, TAKE 1 CAPSULE DAILY, Disp: 90 capsule, Rfl: 3 ?  warfarin (COUMADIN) 2.5 MG tablet, 1 tablet by mouth daily except 2 tablets on Fridays., Disp: 90 tablet, Rfl: 3 ?Social History  ? ?Socioeconomic History  ? Marital status: Widowed  ?  Spouse name: Not on file  ? Number of children: 3  ? Years of education: Not on file  ? Highest education level: High school graduate  ?Occupational History  ? Occupation: Retired  ?Tobacco Use  ? Smoking status: Never  ? Smokeless tobacco: Never  ?Vaping Use  ? Vaping Use: Never used  ?Substance and Sexual Activity  ? Alcohol use: No  ? Drug use: Never  ? Sexual activity: Not Currently  ?Other Topics Concern  ? Not on file  ?Social History Narrative  ? Husband passed 06/29/2021  ? 2 sons live with her  ? ?Social Determinants of Health  ? ?Financial Resource Strain: Low Risk   ? Difficulty of  Paying Living Expenses: Not hard at all  ?Food Insecurity: No Food Insecurity  ? Worried About Charity fundraiser in the Last Year: Never true  ? Ran Out of Food in the Last Year: Never true  ?Transportation Needs: No Transportation Needs  ? Lack of Transportation (Medical): No  ? Lack of Transportation (Non-Medical): No  ?Physical Activity: Insufficiently Active  ? Days of Exercise per Week: 7 days  ? Minutes of Exercise per Session: 10 min  ?Stress: No Stress Concern Present  ? Feeling of Stress : Only a little  ?Social Connections: Socially Isolated  ? Frequency of Communication with Friends and Family: More than three times a week  ? Frequency of Social Gatherings with Friends and Family: More than three times a week  ? Attends Religious Services: Never  ? Active Member of Clubs or Organizations: No  ? Attends Archivist Meetings: Never  ? Marital Status: Widowed  ?Intimate Partner Violence: Not At Risk  ? Fear of Current or Ex-Partner: No  ? Emotionally Abused: No  ? Physically Abused: No  ? Sexually Abused: No  ? ?Family History  ?Problem Relation Age of Onset  ? Heart attack Mother   ? Heart attack Father   ? Parkinson's disease Son   ? ? ?  Objective: ?Office vital signs reviewed. ?BP 135/79   Pulse 76   Temp 97.6 ?F (36.4 ?C)   Ht '5\' 6"'  (1.676 m)   Wt 152 lb (68.9 kg)   SpO2 100%   BMI 24.53 kg/m?  ? ?Physical Examination:  ?General: Awake, alert, nontoxic elderly female, No acute distress ?Cardio: regular rate and rhythm, S1S2 heard, no murmurs appreciated ?Pulm: clear to auscultation bilaterally, no wheezes, rhonchi or rales; normal work of breathing on room air ?Extremities: Nonpitting edema present to the ankles ?MSK: Very antalgic, slow gait.  Relies on cane or assistance for mobility ? ?Assessment/ Plan: ?88 y.o. female  ? ?Paroxysmal atrial fibrillation (HCC) - Plan: CoaguChek XS/INR Waived, CBC, POCT INR ? ?S/P mitral valve repair - Plan: CoaguChek XS/INR Waived, POCT  INR ? ?Anticoagulation monitoring, INR range 2-3 - Plan: CoaguChek XS/INR Waived, POCT INR ? ?Acquired hypothyroidism - Plan: TSH, T4, Free, POCT INR ? ?Stage 3a chronic kidney disease ([place]) - Plan: CMP14+EGFR, POCT INR ? ?Both rate and rhythm controlled today.  INR is slightly subtherapeutic at 1.9 so I have asked that she increase her Coumadin by 1 tablet today to equal 79m.  Then resume 2.5 mg daily except for 5 mg on Fridays.  May follow-up in 6 weeks an appointment has been scheduled for patient. ? ?Check thyroid levels, renal function and CBC. ? ?Orders Placed This Encounter  ?Procedures  ? TSH  ? T4, Free  ? CMP14+EGFR  ? CoaguChek XS/INR Waived  ? CBC  ? ?No orders of the defined types were placed in this encounter. ? ? ? ?AJanora Norlander DO ?[place]?([phone] ? ?

## 2021-11-11 NOTE — Patient Instructions (Signed)
Take 2 tablets today.  Then resume normal 1 tablet daily except Fridays take 2 tablets. ?

## 2021-11-12 LAB — CBC
Hematocrit: 37.2 % (ref 34.0–46.6)
Hemoglobin: 12.2 g/dL (ref 11.1–15.9)
MCH: 29.7 pg (ref 26.6–33.0)
MCHC: 32.8 g/dL (ref 31.5–35.7)
MCV: 91 fL (ref 79–97)
Platelets: 240 10*3/uL (ref 150–450)
RBC: 4.11 x10E6/uL (ref 3.77–5.28)
RDW: 13.5 % (ref 11.7–15.4)
WBC: 5.6 10*3/uL (ref 3.4–10.8)

## 2021-11-12 LAB — CMP14+EGFR
ALT: 10 IU/L (ref 0–32)
AST: 21 IU/L (ref 0–40)
Albumin/Globulin Ratio: 1.7 (ref 1.2–2.2)
Albumin: 4.3 g/dL (ref 3.5–4.6)
Alkaline Phosphatase: 61 IU/L (ref 44–121)
BUN/Creatinine Ratio: 16 (ref 12–28)
BUN: 16 mg/dL (ref 10–36)
Bilirubin Total: 0.4 mg/dL (ref 0.0–1.2)
CO2: 27 mmol/L (ref 20–29)
Calcium: 9.6 mg/dL (ref 8.7–10.3)
Chloride: 101 mmol/L (ref 96–106)
Creatinine, Ser: 0.98 mg/dL (ref 0.57–1.00)
Globulin, Total: 2.5 g/dL (ref 1.5–4.5)
Glucose: 78 mg/dL (ref 70–99)
Potassium: 4.7 mmol/L (ref 3.5–5.2)
Sodium: 140 mmol/L (ref 134–144)
Total Protein: 6.8 g/dL (ref 6.0–8.5)
eGFR: 55 mL/min/{1.73_m2} — ABNORMAL LOW (ref >59)

## 2021-11-12 LAB — T4, FREE: Free T4: 1.1 ng/dL (ref 0.82–1.77)

## 2021-11-12 LAB — TSH: TSH: 2.49 u[IU]/mL (ref 0.450–4.500)

## 2021-11-16 ENCOUNTER — Other Ambulatory Visit: Payer: Self-pay | Admitting: Family Medicine

## 2021-11-25 ENCOUNTER — Ambulatory Visit (INDEPENDENT_AMBULATORY_CARE_PROVIDER_SITE_OTHER): Payer: MEDICARE | Admitting: Family Medicine

## 2021-11-25 ENCOUNTER — Encounter: Payer: Self-pay | Admitting: Family Medicine

## 2021-11-25 VITALS — BP 120/68 | HR 79 | Temp 98.7°F | Wt 148.0 lb

## 2021-11-25 DIAGNOSIS — R600 Localized edema: Secondary | ICD-10-CM | POA: Diagnosis not present

## 2021-11-25 DIAGNOSIS — L03116 Cellulitis of left lower limb: Secondary | ICD-10-CM

## 2021-11-25 MED ORDER — CEPHALEXIN 500 MG PO CAPS: 500.0000 mg | ORAL_CAPSULE | Freq: Three times a day (TID) | ORAL | 0 refills | Status: DC

## 2021-11-25 MED ORDER — FUROSEMIDE 20 MG PO TABS: 20.0000 mg | ORAL_TABLET | Freq: Every day | ORAL | 0 refills | Status: DC

## 2021-11-25 NOTE — Progress Notes (Signed)
Subjective:  Patient ID: [name], female    DOB: [birth year], 88 y.o.   MRN: [id number]   Patient Care Team: [name], DO as PCP - General (Family Medicine) Shea Evans Norva Riffle, LCSW as [place] (Licensed Clinical Social Worker) Croitoru, Dani Gobble, MD as Consulting Physician (Cardiology) Pixie Casino, MD as Consulting Physician (Cardiology)    Chief Complaint:  left calf swollen (Cat scratch/bite)     HPI: [name] is a 88 y.o. female presenting on 11/25/2021 for left calf swollen (Cat scratch/bite)     Pt presents today for evaluation of left lower extremity swelling and erythema after her cat scratched her 2 days ago. States the wound was red, irritated, and slightly swollen after the initial incident. Reports the swelling has gradually increased until today when it was very swollen and weeping. She denies fever, chills, weakness, or confusion. She has been cleaning the wound with soap and water.          Relevant past medical, surgical, family, and social history reviewed and updated as indicated.  Allergies and medications reviewed and updated. Data reviewed: Chart in Epic.         Past Medical History:  Diagnosis Date   A-fib ([place])     Dyslipidemia     Hyperlipidemia     Hypothyroidism      2 degree amidarone    Pacemaker     PAF (paroxysmal atrial fibrillation) (HCC)     S/P mitral valve repair     SSS (sick sinus syndrome) ([place])     Systemic hypertension             Past Surgical History:  Procedure Laterality Date   ABDOMINAL HYSTERECTOMY   1989   CARDIAC CATHETERIZATION   01/11/2008    mild-mod pulm. hypertension,normal coronaries   MINIMALLY INVASIVE MAZE PROCEDURE   02/2008   MITRAL VALVE REPAIR   September 2009    [facility] ring annuloplasty   NM MYOVIEW LTD   03/28/2007    no ischemia   PACEMAKER PLACEMENT   November 2008   PPM GENERATOR CHANGEOUT N/A 08/25/2016    Procedure: PPM Generator  Changeout - Battery Only;  Surgeon: Sanda Klein, MD;  Location: [facility];  Service: Cardiovascular;  Laterality: N/A;   US ECHOCARDIOGRAPHY   03/07/2012    mild LVH,EF =>55%,mild MR,mod TR,trace AI,right atrium mod. dilated      Social History         Socioeconomic History   Marital status: Widowed      Spouse name: Not on file   Number of children: 3   Years of education: Not on file   Highest education level: High school graduate  Occupational History   Occupation: Retired  Tobacco Use   Smoking status: Never   Smokeless tobacco: Never  Vaping Use   Vaping Use: Never used  Substance and Sexual Activity   Alcohol use: No   Drug use: Never   Sexual activity: Not Currently  Other Topics Concern   Not on file  Social History Narrative    Husband passed 06/29/2021    2 sons live with her    Social Determinants of Health       Financial Resource Strain: Low Risk    Difficulty of Paying Living Expenses: Not hard at all  Food Insecurity: No Food Insecurity   Worried About Charity fundraiser in the Last Year: Never  true   Ran Out of Food in the Last Year: Never true  Transportation Needs: No Transportation Needs   Lack of Transportation (Medical): No   Lack of Transportation (Non-Medical): No  Physical Activity: Insufficiently Active   Days of Exercise per Week: 7 days   Minutes of Exercise per Session: 10 min  Stress: No Stress Concern Present   Feeling of Stress : Only a little  Social Connections: Socially Isolated   Frequency of Communication with Friends and Family: More than three times a week   Frequency of Social Gatherings with Friends and Family: More than three times a week   Attends Religious Services: Never   Marine scientist or Organizations: No   Attends Archivist Meetings: Never   Marital Status: Widowed  Intimate Partner Violence: Not At Risk   Fear of Current or Ex-Partner: No   Emotionally Abused: No   Physically Abused:  No   Sexually Abused: No          Outpatient Encounter Medications as of 11/25/2021  Medication Sig   cephALEXin (KEFLEX) 500 MG capsule Take 1 capsule (500 mg total) by mouth 3 (three) times daily for 5 days.   furosemide (LASIX) 20 MG tablet Take 1 tablet (20 mg total) by mouth daily for 3 days.   levothyroxine (SYNTHROID) 25 MCG tablet TAKE 1 TABLET DAILY   metoprolol tartrate (LOPRESSOR) 50 MG tablet TAKE 1 TABLET TWICE A DAY   Polyethyl Glycol-Propyl Glycol (SYSTANE OP) Apply 1 drop to eye daily as needed (dry eyes).   triamterene-hydrochlorothiazide (DYAZIDE) 37.5-25 MG capsule TAKE 1 CAPSULE DAILY   warfarin (COUMADIN) 2.5 MG tablet 1 tablet by mouth daily except 2 tablets on Fridays.    No facility-administered encounter medications on file as of 11/25/2021.           Allergies  Allergen Reactions   Statins Other (See Comments)      Myalgia      Review of Systems  Constitutional:  Negative for activity change, appetite change, chills, fatigue and fever.  Respiratory:  Negative for apnea, cough, choking, chest tightness, shortness of breath, wheezing and stridor.   Cardiovascular:  Positive for leg swelling. Negative for chest pain and palpitations.  Genitourinary:  Negative for decreased urine volume and difficulty urinating.  Neurological:  Negative for weakness.  Psychiatric/Behavioral:  Negative for confusion.   All other systems reviewed and are negative.        Objective:  BP 120/68   Pulse 79   Temp 98.7 F (37.1 C)   Wt 148 lb (67.1 kg)   SpO2 98%   BMI 23.89 kg/m        Wt Readings from Last 3 Encounters:  11/25/21 148 lb (67.1 kg)  11/11/21 152 lb (68.9 kg)  10/08/21 152 lb (68.9 kg)      Physical Exam Vitals and nursing note reviewed.  Constitutional:      General: She is not in acute distress.    Appearance: She is normal weight. She is not ill-appearing, toxic-appearing or diaphoretic.     Comments: Frail elderly  Eyes:     Pupils: Pupils  are equal, round, and reactive to light.  Cardiovascular:     Rate and Rhythm: Normal rate and regular rhythm.     Heart sounds: No murmur heard.   No friction rub. No gallop.  Pulmonary:     Effort: Pulmonary effort is normal.     Breath sounds: Normal breath sounds.  Abdominal:     General: Bowel sounds are normal. There is no distension.     Palpations: Abdomen is soft.  Musculoskeletal:     Cervical back: Neck supple.     Right lower leg: 1+ Edema present.     Left lower leg: 3+ Edema (pitting and weeping) present.  Skin:    General: Skin is warm and dry.     Capillary Refill: Capillary refill takes less than 2 seconds.     Findings: Erythema and wound present.        Neurological:     General: No focal deficit present.     Mental Status: She is alert and oriented to person, place, and time.           Results for orders placed or performed in visit on 11/11/21  TSH  Result Value Ref Range    TSH 2.490 0.450 - 4.500 uIU/mL  T4, Free  Result Value Ref Range    Free T4 1.10 0.82 - 1.77 ng/dL  CMP14+EGFR  Result Value Ref Range    Glucose 78 70 - 99 mg/dL    BUN 16 10 - 36 mg/dL    Creatinine, Ser 0.98 0.57 - 1.00 mg/dL    eGFR 55 (L) >59 mL/min/1.73    BUN/Creatinine Ratio 16 12 - 28    Sodium 140 134 - 144 mmol/L    Potassium 4.7 3.5 - 5.2 mmol/L    Chloride 101 96 - 106 mmol/L    CO2 27 20 - 29 mmol/L    Calcium 9.6 8.7 - 10.3 mg/dL    Total Protein 6.8 6.0 - 8.5 g/dL    Albumin 4.3 3.5 - 4.6 g/dL    Globulin, Total 2.5 1.5 - 4.5 g/dL    Albumin/Globulin Ratio 1.7 1.2 - 2.2    Bilirubin Total 0.4 0.0 - 1.2 mg/dL    Alkaline Phosphatase 61 44 - 121 IU/L    AST 21 0 - 40 IU/L    ALT 10 0 - 32 IU/L  CoaguChek XS/INR Waived  Result Value Ref Range    INR 1.9 (H) 0.9 - 1.1    Prothrombin Time 22.4 sec  CBC  Result Value Ref Range    WBC 5.6 3.4 - 10.8 x10E3/uL    RBC 4.11 3.77 - 5.28 x10E6/uL    Hemoglobin 12.2 11.1 - 15.9 g/dL    Hematocrit 37.2 34.0 -  46.6 %    MCV 91 79 - 97 fL    MCH 29.7 26.6 - 33.0 pg    MCHC 32.8 31.5 - 35.7 g/dL    RDW 13.5 11.7 - 15.4 %    Platelets 240 150 - 450 x10E3/uL  POCT INR  Result Value Ref Range    INR 1.9 (A) 2 - 3         Pertinent labs & imaging results that were available during my care of the patient were reviewed by me and considered in my medical decision making.   Assessment & Plan:  Sinead was seen today for left calf swollen.   Diagnoses and all orders for this visit:   Cellulitis of left lower extremity Cat scratch to LLE with surrounding erythema and swelling. Keflex as prescribed. Unna boot applied. Red flags discussed in detail. Aware to follow up in office in 5 days for reevaluation, Unna Boot removal and possible reapplication, and repeat INR as pt was placed on antibiotic therapy today.  -     cephALEXin (KEFLEX) 500 MG  capsule; Take 1 capsule (500 mg total) by mouth 3 (three) times daily for 5 days.   Localized edema No red flags concerning for DVT as homan's sign is negative and no calf tenderness. Known source of infection. Will add lasix for 3 days for additional diuresis. Pt to follow up in office in 5 days for reevaluation.  -     furosemide (LASIX) 20 MG tablet; Take 1 tablet (20 mg total) by mouth daily for 3 days.       Continue all other maintenance medications.   Follow up plan: Return in about 5 days (around 11/30/2021), or if symptoms worsen or fail to improve, for [employer] and INR.     Continue healthy lifestyle choices, including diet (rich in fruits, vegetables, and lean proteins, and low in salt and simple carbohydrates) and exercise (at least 30 minutes of moderate physical activity daily).   Educational handout given for [employer], cellulitis   The above assessment and management plan was discussed with the patient. The patient verbalized understanding of and has agreed to the management plan. Patient is aware to call the clinic if they develop any new  symptoms or if symptoms persist or worsen. Patient is aware when to return to the clinic for a follow-up visit. Patient educated on when it is appropriate to go to the emergency department.    Monia Pouch, FNP-C [facility] [phone]

## 2021-11-30 ENCOUNTER — Encounter: Payer: Self-pay | Admitting: Family Medicine

## 2021-11-30 ENCOUNTER — Ambulatory Visit (INDEPENDENT_AMBULATORY_CARE_PROVIDER_SITE_OTHER): Payer: MEDICARE | Admitting: Family Medicine

## 2021-11-30 VITALS — BP 118/69 | HR 78 | Temp 97.5°F | Ht 66.0 in | Wt 149.2 lb

## 2021-11-30 DIAGNOSIS — R791 Abnormal coagulation profile: Secondary | ICD-10-CM | POA: Diagnosis not present

## 2021-11-30 DIAGNOSIS — L03116 Cellulitis of left lower limb: Secondary | ICD-10-CM

## 2021-11-30 DIAGNOSIS — Z7901 Long term (current) use of anticoagulants: Secondary | ICD-10-CM

## 2021-11-30 LAB — COAGUCHEK XS/INR WAIVED
INR: 2.7 — ABNORMAL HIGH (ref 0.9–1.1)
Prothrombin Time: 32.5 s

## 2021-11-30 MED ORDER — CEPHALEXIN 500 MG PO CAPS: 500.0000 mg | ORAL_CAPSULE | Freq: Three times a day (TID) | ORAL | 0 refills | Status: AC

## 2021-11-30 NOTE — Patient Instructions (Signed)
Cellulitis, Adult  Cellulitis is a skin infection. The infected area is often warm, red, swollen, and sore. It occurs most often in the arms and lower legs. It is very important to get treated for this condition. What are the causes? This condition is caused by bacteria. The bacteria enter through a break in the skin, such as a cut, burn, insect bite, open sore, or crack. What increases the risk? This condition is more likely to occur in people who: Have a weak body defense system (immune system). Have open cuts, burns, bites, or scrapes on the skin. Are older than 86 years of age. Have a blood sugar problem (diabetes). Have a long-lasting (chronic) liver disease (cirrhosis) or kidney disease. Are very overweight (obese). Have a skin problem, such as: Itchy rash (eczema). Slow movement of blood in the veins (venous stasis). Fluid buildup below the skin (edema). Have been treated with high-energy rays (radiation). Use IV drugs. What are the signs or symptoms? Symptoms of this condition include: Skin that is: Red. Streaking. Spotting. Swollen. Sore or painful when you touch it. Warm. A fever. Chills. Blisters. How is this diagnosed? This condition is diagnosed based on: Medical history. Physical exam. Blood tests. Imaging tests. How is this treated? Treatment for this condition may include: Medicines to treat infections or allergies. Home care, such as: Rest. Placing cold or warm cloths (compresses) on the skin. Hospital care, if the condition is very bad. Follow these instructions at home: Medicines Take over-the-counter and prescription medicines only as told by your doctor. If you were prescribed an antibiotic medicine, take it as told by your doctor. Do not stop taking it even if you start to feel better. General instructions  Drink enough fluid to keep your pee (urine) pale yellow. Do not touch or rub the infected area. Raise (elevate) the infected area above  the level of your heart while you are sitting or lying down. Place cold or warm cloths on the area as told by your doctor. Keep all follow-up visits as told by your doctor. This is important. Contact a doctor if: You have a fever. You do not start to get better after 1-2 days of treatment. Your bone or joint under the infected area starts to hurt after the skin has healed. Your infection comes back. This can happen in the same area or another area. You have a swollen bump in the area. You have new symptoms. You feel ill and have muscle aches and pains. Get help right away if: Your symptoms get worse. You feel very sleepy. You throw up (vomit) or have watery poop (diarrhea) for a long time. You see red streaks coming from the area. Your red area gets larger. Your red area turns dark in color. These symptoms may represent a serious problem that is an emergency. Do not wait to see if the symptoms will go away. Get medical help right away. Call your local emergency services (911 in the [place] Do not drive yourself to the hospital. Summary Cellulitis is a skin infection. The area is often warm, red, swollen, and sore. This condition is treated with medicines, rest, and cold and warm cloths. Take all medicines only as told by your doctor. Tell your doctor if symptoms do not start to get better after 1-2 days of treatment. This information is not intended to replace advice given to you by your health care provider. Make sure you discuss any questions you have with your health care provider. Document Revised: 03/26/2021 Document   Reviewed: 03/26/2021 Elsevier Patient Education  2023 Elsevier Inc.  

## 2021-11-30 NOTE — Progress Notes (Signed)
Subjective: CC:INR check PCP: [name], DO HRC:[name] is a 88 y.o. female presenting to clinic today for:  1.  Leg cellulitis Patient was seen on 11/25/2021 for left lower extremity cellulitis after having been scratched by her cat 2 days prior.  She started on oral antibiotics and instructed to follow-up today so that she may have INR rechecked given antibiotic use.  She reports that the cellulitis has really not changed much.  She thinks she is taking her Keflex 3 times daily but feels like she has several capsules left in the bottle even though it was only intended for a 5-day supply prescribed on 11/25/2021.  She notes ongoing erythema along the lateral leg.  Has ongoing swelling but remove the Unna boot recently because it was too tight   ROS: Per HPI  Allergies  Allergen Reactions   Statins Other (See Comments)    Myalgia   Past Medical History:  Diagnosis Date   A-fib ([place])    Dyslipidemia    Hyperlipidemia    Hypothyroidism    2 degree amidarone    Pacemaker    PAF (paroxysmal atrial fibrillation) (HCC)    S/P mitral valve repair    SSS (sick sinus syndrome) (HCC)    Systemic hypertension     Current Outpatient Medications:    cephALEXin (KEFLEX) 500 MG capsule, Take 1 capsule (500 mg total) by mouth 3 (three) times daily for 5 days., Disp: 15 capsule, Rfl: 0   levothyroxine (SYNTHROID) 25 MCG tablet, TAKE 1 TABLET DAILY, Disp: 90 tablet, Rfl: 3   metoprolol tartrate (LOPRESSOR) 50 MG tablet, TAKE 1 TABLET TWICE A DAY, Disp: 180 tablet, Rfl: 3   Polyethyl Glycol-Propyl Glycol (SYSTANE OP), Apply 1 drop to eye daily as needed (dry eyes)., Disp: , Rfl:    triamterene-hydrochlorothiazide (DYAZIDE) 37.5-25 MG capsule, TAKE 1 CAPSULE DAILY, Disp: 90 capsule, Rfl: 3   warfarin (COUMADIN) 2.5 MG tablet, 1 tablet by mouth daily except 2 tablets on Fridays., Disp: 90 tablet, Rfl: 3   furosemide (LASIX) 20 MG tablet, Take 1 tablet (20 mg total) by mouth daily  for 3 days., Disp: 3 tablet, Rfl: 0 Social History   Socioeconomic History   Marital status: Widowed    Spouse name: Not on file   Number of children: 3   Years of education: Not on file   Highest education level: High school graduate  Occupational History   Occupation: Retired  Tobacco Use   Smoking status: Never   Smokeless tobacco: Never  Vaping Use   Vaping Use: Never used  Substance and Sexual Activity   Alcohol use: No   Drug use: Never   Sexual activity: Not Currently  Other Topics Concern   Not on file  Social History Narrative   Husband passed 06/29/2021   2 sons live with her   Social Determinants of Health   Financial Resource Strain: Low Risk    Difficulty of Paying Living Expenses: Not hard at all  Food Insecurity: No Food Insecurity   Worried About Charity fundraiser in the Last Year: Never true   [place] in the Last Year: Never true  Transportation Needs: No Transportation Needs   Lack of Transportation (Medical): No   Lack of Transportation (Non-Medical): No  Physical Activity: Insufficiently Active   Days of Exercise per Week: 7 days   Minutes of Exercise per Session: 10 min  Stress: No Stress Concern Present   Feeling of  Stress : Only a little  Social Connections: Socially Isolated   Frequency of Communication with Friends and Family: More than three times a week   Frequency of Social Gatherings with Friends and Family: More than three times a week   Attends Religious Services: Never   Marine scientist or Organizations: No   Attends Archivist Meetings: Never   Marital Status: Widowed  Human resources officer Violence: Not At Risk   Fear of Current or Ex-Partner: No   Emotionally Abused: No   Physically Abused: No   Sexually Abused: No   Family History  Problem Relation Age of Onset   Heart attack Mother    Heart attack Father    Parkinson's disease Son     Objective: Office vital signs reviewed. BP 118/69   Pulse 78    Temp (!) 97.5 F (36.4 C)   Ht '5\' 6"'$  (1.676 m)   Wt 149 lb 3.2 oz (67.7 kg)   SpO2 100%   BMI 24.08 kg/m   Physical Examination:  General: Awake, alert, nontoxic elderly female, No acute distress HEENT: Sclera white.  Moist mucous membranes Cardio: regular rate and rhythm  Pulm: normal WOB on room air MSK: arrives in wheelchair. LLE with 2+pitting edema to knees. RLE 1+pitting edema.  LLE with a patch of erythema along the lateral aspect of the leg but no active weeping or exudates.  No warmth.  No induration.  Assessment/ Plan: 88 y.o. female   Anticoagulation monitoring, INR range 2-3 - Plan: CoaguChek XS/INR Waived  Cellulitis of left lower extremity - Plan: cephALEXin (KEFLEX) 500 MG capsule  INR therapeutic at 2.7.  No changes.  I am going to extend her Keflex for another 5 days.  She has ongoing erythema but there was no significant warmth or induration.  She still has quite a bit of edema and therefore she was rewrapped in an Haematologist.  I would like her reassessed on Thursday, sooner if concerns arise.  May need a repeat Unna boot Thursday depending on degree of swelling.  Orders Placed This Encounter  Procedures   CoaguChek XS/INR Waived   No orders of the defined types were placed in this encounter.    [name], DO [place] [phone]

## 2021-12-01 ENCOUNTER — Ambulatory Visit (INDEPENDENT_AMBULATORY_CARE_PROVIDER_SITE_OTHER): Payer: MEDICARE

## 2021-12-01 DIAGNOSIS — I495 Sick sinus syndrome: Secondary | ICD-10-CM | POA: Diagnosis not present

## 2021-12-01 DIAGNOSIS — Z950 Presence of cardiac pacemaker: Secondary | ICD-10-CM | POA: Diagnosis not present

## 2021-12-02 LAB — CUP PACEART REMOTE DEVICE CHECK
Battery Remaining Longevity: 25 mo
Battery Voltage: 2.92 V
Brady Statistic AP VP Percent: 0.71 %
Brady Statistic AP VS Percent: 79.02 %
Brady Statistic AS VP Percent: 0.07 %
Brady Statistic AS VS Percent: 20.19 %
Brady Statistic RA Percent Paced: 76.39 %
Brady Statistic RV Percent Paced: 4.11 %
Date Time Interrogation Session: 20230606115251
Implantable Lead Implant Date: 20081126
Implantable Lead Implant Date: 20081126
Implantable Lead Location: 753859
Implantable Lead Location: 753860
Implantable Lead Model: 5076
Implantable Lead Model: 5076
Implantable Pulse Generator Implant Date: 20180228
Lead Channel Impedance Value: 323 Ohm
Lead Channel Impedance Value: 342 Ohm
Lead Channel Impedance Value: 342 Ohm
Lead Channel Impedance Value: 380 Ohm
Lead Channel Pacing Threshold Amplitude: 1 V
Lead Channel Pacing Threshold Pulse Width: 0.4 ms
Lead Channel Sensing Intrinsic Amplitude: 0.125 mV
Lead Channel Sensing Intrinsic Amplitude: 0.125 mV
Lead Channel Sensing Intrinsic Amplitude: 6.875 mV
Lead Channel Sensing Intrinsic Amplitude: 6.875 mV
Lead Channel Setting Pacing Amplitude: 2.75 V
Lead Channel Setting Pacing Amplitude: 3.5 V
Lead Channel Setting Pacing Pulse Width: 0.4 ms
Lead Channel Setting Sensing Sensitivity: 2 mV

## 2021-12-03 ENCOUNTER — Encounter: Payer: Self-pay | Admitting: Family Medicine

## 2021-12-03 ENCOUNTER — Ambulatory Visit (INDEPENDENT_AMBULATORY_CARE_PROVIDER_SITE_OTHER): Payer: MEDICARE | Admitting: Family Medicine

## 2021-12-03 VITALS — BP 126/70 | HR 80 | Temp 98.2°F | Ht 66.0 in | Wt 149.0 lb

## 2021-12-03 DIAGNOSIS — S91102A Unspecified open wound of left great toe without damage to nail, initial encounter: Secondary | ICD-10-CM

## 2021-12-03 DIAGNOSIS — S91102D Unspecified open wound of left great toe without damage to nail, subsequent encounter: Secondary | ICD-10-CM | POA: Diagnosis not present

## 2021-12-03 DIAGNOSIS — L03116 Cellulitis of left lower limb: Secondary | ICD-10-CM

## 2021-12-03 NOTE — Progress Notes (Signed)
Subjective:  Patient ID: [name], female    DOB: 01-31-1929, 88 y.o.   MRN: [id number]  Patient Care Team: [name], DO as PCP - General (Family Medicine) Shea Evans Norva Riffle, LCSW as [facility] (Licensed Clinical Social Worker) Croitoru, Dani Gobble, MD as Consulting Physician (Cardiology) Pixie Casino, MD as Consulting Physician (Cardiology)   Chief Complaint:  Foot Swelling   HPI: [name] is a 88 y.o. female presenting on 12/03/2021 for Foot Swelling   Pt presents today for reevaluation of LLE cellulitis. She was initially seen on 11/25/2021 for leg swelling and redness after her cat scratched her. She was placed on keflex and an [employer] was applied. She returned to office on 11/30/2021. She had taken her her Rolena Infante off prior to this appointment stating it was tight and bothering her. Dr. Lajuana Ripple placed her on 5 more days of Keflex and had nurse reapply Rolena Infante. She returns today for follow up. States she removed the [employer] yesterday because it was tight and bothering her. She did not pick up the new antibiotic prescription that was prescribed at last visit and is unsure if she completed the first round of antibiotics. She now has an open wound to her left great toe, plantar surface. States she had a callus there and tried to cut it off with scissors. She states she did this a few days ago. States the area does not hurt. No fever, chills, weakness, or malaise reported.      Relevant past medical, surgical, family, and social history reviewed and updated as indicated.  Allergies and medications reviewed and updated. Data reviewed: Chart in Epic.   Past Medical History:  Diagnosis Date   A-fib ([place])    Dyslipidemia    Hyperlipidemia    Hypothyroidism    2 degree amidarone    Pacemaker    PAF (paroxysmal atrial fibrillation) ([facility])    S/P mitral valve repair    SSS (sick sinus syndrome) ([place])    Systemic  hypertension     Past Surgical History:  Procedure Laterality Date   ABDOMINAL HYSTERECTOMY  1989   CARDIAC CATHETERIZATION  01/11/2008   mild-mod pulm. hypertension,normal coronaries   MINIMALLY INVASIVE MAZE PROCEDURE  02/2008   MITRAL VALVE REPAIR  September 2009   [facility] ring annuloplasty   NM MYOVIEW LTD  03/28/2007   no ischemia   PACEMAKER PLACEMENT  November 2008   PPM GENERATOR CHANGEOUT N/A 08/25/2016   Procedure: PPM Generator Changeout - Battery Only;  Surgeon: Sanda Klein, MD;  Location: [facility];  Service: Cardiovascular;  Laterality: N/A;   US ECHOCARDIOGRAPHY  03/07/2012   mild LVH,EF =>55%,mild MR,mod TR,trace AI,right atrium mod. dilated    Social History   Socioeconomic History   Marital status: Widowed    Spouse name: Not on file   Number of children: 3   Years of education: Not on file   Highest education level: High school graduate  Occupational History   Occupation: Retired  Tobacco Use   Smoking status: Never   Smokeless tobacco: Never  Vaping Use   Vaping Use: Never used  Substance and Sexual Activity   Alcohol use: No   Drug use: Never   Sexual activity: Not Currently  Other Topics Concern   Not on file  Social History Narrative   Husband passed 06/29/2021   2 sons live with her   Social Determinants of Health  Financial Resource Strain: Low Risk  (09/14/2021)   Overall Financial Resource Strain (CARDIA)    Difficulty of Paying Living Expenses: Not hard at all  Food Insecurity: No Food Insecurity (09/14/2021)   Hunger Vital Sign    Worried About Running Out of Food in the Last Year: Never true    Ran Out of Food in the Last Year: Never true  Transportation Needs: No Transportation Needs (09/14/2021)   PRAPARE - Hydrologist (Medical): No    Lack of Transportation (Non-Medical): No  Physical Activity: Insufficiently Active (09/14/2021)   Exercise Vital Sign    Days of Exercise per Week: 7 days     Minutes of Exercise per Session: 10 min  Stress: No Stress Concern Present (09/14/2021)   [place]    Feeling of Stress : Only a little  Social Connections: Socially Isolated (09/14/2021)   Social Connection and Isolation Panel [NHANES]    Frequency of Communication with Friends and Family: More than three times a week    Frequency of Social Gatherings with Friends and Family: More than three times a week    Attends Religious Services: Never    Marine scientist or Organizations: No    Attends Archivist Meetings: Never    Marital Status: Widowed  Intimate Partner Violence: Not At Risk (09/14/2021)   Humiliation, Afraid, Rape, and Kick questionnaire    Fear of Current or Ex-Partner: No    Emotionally Abused: No    Physically Abused: No    Sexually Abused: No    Outpatient Encounter Medications as of 12/03/2021  Medication Sig   levothyroxine (SYNTHROID) 25 MCG tablet TAKE 1 TABLET DAILY   metoprolol tartrate (LOPRESSOR) 50 MG tablet TAKE 1 TABLET TWICE A DAY   Polyethyl Glycol-Propyl Glycol (SYSTANE OP) Apply 1 drop to eye daily as needed (dry eyes).   triamterene-hydrochlorothiazide (DYAZIDE) 37.5-25 MG capsule TAKE 1 CAPSULE DAILY   warfarin (COUMADIN) 2.5 MG tablet 1 tablet by mouth daily except 2 tablets on Fridays.   cephALEXin (KEFLEX) 500 MG capsule Take 1 capsule (500 mg total) by mouth 3 (three) times daily for 5 days. (Patient not taking: Reported on 12/03/2021)   furosemide (LASIX) 20 MG tablet Take 1 tablet (20 mg total) by mouth daily for 3 days.   No facility-administered encounter medications on file as of 12/03/2021.    Allergies  Allergen Reactions   Statins Other (See Comments)    Myalgia    Review of Systems  Constitutional:  Negative for activity change, appetite change, chills, diaphoresis, fatigue, fever and unexpected weight change.  Respiratory:  Negative for cough and  shortness of breath.   Cardiovascular:  Positive for leg swelling. Negative for chest pain and palpitations.  Genitourinary:  Negative for decreased urine volume and difficulty urinating.  Skin:  Positive for color change and wound. Negative for pallor and rash.  Neurological:  Negative for weakness.  Psychiatric/Behavioral:  Negative for confusion.   All other systems reviewed and are negative.       Objective:  BP 126/70   Pulse 80   Temp 98.2 F (36.8 C)   Ht '5\' 6"'$  (1.676 m)   Wt 149 lb (67.6 kg)   SpO2 97%   BMI 24.05 kg/m    Wt Readings from Last 3 Encounters:  12/03/21 149 lb (67.6 kg)  11/30/21 149 lb 3.2 oz (67.7 kg)  11/25/21 148 lb (67.1 kg)  Physical Exam Vitals and nursing note reviewed.  Constitutional:      General: She is not in acute distress.    Appearance: She is normal weight. She is not toxic-appearing or diaphoretic.  HENT:     Head: Normocephalic and atraumatic.     Mouth/Throat:     Mouth: Mucous membranes are moist.  Eyes:     Pupils: Pupils are equal, round, and reactive to light.  Cardiovascular:     Rate and Rhythm: Normal rate and regular rhythm.     Pulses: Normal pulses.     Heart sounds: Normal heart sounds.  Musculoskeletal:     Right lower leg: 1+ Pitting Edema present.     Left lower leg: 2+ Pitting Edema (no weeping, slight erythema to lower leg and left foot) present.       Feet:  Skin:    General: Skin is warm and dry.     Capillary Refill: Capillary refill takes less than 2 seconds.  Neurological:     General: No focal deficit present.     Mental Status: She is alert. Mental status is at baseline.     Gait: Gait abnormal (slow, using cane).        Results for orders placed or performed in visit on 12/01/21  CUP PACEART REMOTE DEVICE CHECK  Result Value Ref Range   Date Time Interrogation Session [id number]    Pulse Generator Manufacturer [employer]M    Pulse Gen Model [id number] Azure XT DR MRI    Pulse Gen Serial  Number [id number]    Clinic Name [facility] Pulse Generator Type Implantable Pulse Generator    Implantable Pulse Generator Implant Date [id number]    Implantable Lead Manufacturer [employer]    Implantable Lead Model 5076 CapSureFix Novus    Implantable Lead Serial Number [id number]    Implantable Lead Implant Date [id number]    Implantable Lead Location Detail 1 APPENDAGE    Implantable Lead Location [id number]    Implantable Lead Manufacturer [employer]M    Implantable Lead Model 5076 CapSureFix Novus    Implantable Lead Serial Number [id number]    Implantable Lead Implant Date [id number]    Implantable Lead Location Detail 1 APEX    Implantable Lead Location [phone]    Lead Channel Setting Sensing Sensitivity 2 mV   Lead Channel Setting Pacing Amplitude 3.5 V   Lead Channel Setting Pacing Pulse Width 0.4 ms   Lead Channel Setting Pacing Amplitude 2.75 V   Lead Channel Impedance Value 380 ohm   Lead Channel Impedance Value 342 ohm   Lead Channel Sensing Intrinsic Amplitude 0.125 mV   Lead Channel Sensing Intrinsic Amplitude 0.125 mV   Lead Channel Impedance Value 342 ohm   Lead Channel Impedance Value 323 ohm   Lead Channel Sensing Intrinsic Amplitude 6.875 mV   Lead Channel Sensing Intrinsic Amplitude 6.875 mV   Lead Channel Pacing Threshold Amplitude 1 V   Lead Channel Pacing Threshold Pulse Width 0.4 ms   Battery Status OK    Battery Remaining Longevity 25 mo   Battery Voltage 2.92 V   Brady Statistic RA Percent Paced 76.39 %   Brady Statistic RV Percent Paced 4.11 %   Brady Statistic AP VP Percent 0.71 %   Brady Statistic AS VP Percent 0.07 %   Brady Statistic AP VS Percent 79.02 %   Brady Statistic AS VS Percent 20.19 %       Pertinent labs & imaging results that were available  during my care of the patient were reviewed by me and considered in my medical decision making.  Assessment & Plan:  Doloris was seen today for foot swelling.  Diagnoses and all orders for this  visit:  Open wound of left great toe, initial encounter Cellulitis of left lower extremity Pt has not been able to tolerate [employer] therapy and now has an open wound to her left great toe. Pt aware to pick up antibiotics that were prescribed at last visit and take them as prescribed. Referral to wound care. Spoke with son and advised him of plan of care. He verbalized understanding.  -     AMB referral to wound care center     Continue all other maintenance medications.  Follow up plan: Return if symptoms worsen or fail to improve.   Continue healthy lifestyle choices, including diet (rich in fruits, vegetables, and lean proteins, and low in salt and simple carbohydrates) and exercise (at least 30 minutes of moderate physical activity daily).  Educational handout given for cellulitis   The above assessment and management plan was discussed with the patient. The patient verbalized understanding of and has agreed to the management plan. Patient is aware to call the clinic if they develop any new symptoms or if symptoms persist or worsen. Patient is aware when to return to the clinic for a follow-up visit. Patient educated on when it is appropriate to go to the emergency department.   Monia Pouch, FNP-C [facility] [phone]

## 2021-12-07 ENCOUNTER — Encounter (HOSPITAL_BASED_OUTPATIENT_CLINIC_OR_DEPARTMENT_OTHER): Payer: MEDICARE | Attending: Internal Medicine | Admitting: Internal Medicine

## 2021-12-07 DIAGNOSIS — G9009 Other idiopathic peripheral autonomic neuropathy: Secondary | ICD-10-CM

## 2021-12-07 DIAGNOSIS — T8133XA Disruption of traumatic injury wound repair, initial encounter: Secondary | ICD-10-CM

## 2021-12-07 DIAGNOSIS — X58XXXA Exposure to other specified factors, initial encounter: Secondary | ICD-10-CM | POA: Diagnosis not present

## 2021-12-07 DIAGNOSIS — I872 Venous insufficiency (chronic) (peripheral): Secondary | ICD-10-CM | POA: Diagnosis not present

## 2021-12-07 DIAGNOSIS — Z7901 Long term (current) use of anticoagulants: Secondary | ICD-10-CM | POA: Insufficient documentation

## 2021-12-07 DIAGNOSIS — L97522 Non-pressure chronic ulcer of other part of left foot with fat layer exposed: Secondary | ICD-10-CM

## 2021-12-07 DIAGNOSIS — I480 Paroxysmal atrial fibrillation: Secondary | ICD-10-CM | POA: Diagnosis not present

## 2021-12-07 DIAGNOSIS — L03116 Cellulitis of left lower limb: Secondary | ICD-10-CM | POA: Insufficient documentation

## 2021-12-07 DIAGNOSIS — G629 Polyneuropathy, unspecified: Secondary | ICD-10-CM | POA: Diagnosis not present

## 2021-12-10 NOTE — Progress Notes (Signed)
GEORJEAN, TOYA ([id number]) Visit Report for 12/07/2021 Chief Complaint Document Details Patient Name: Date of Service: [name], [name] H. 12/07/2021 1:00 PM Medical Record Number: [id number] Patient Account Number: [id number] Date of Birth/Sex: Treating RN: [birth year] (88 y.o. F) Primary Care Provider: Ronnie Doss Other Clinician: Referring Provider: Treating Provider/Extender: Kalman Shan RA KES, LINDA Weeks in Treatment: 0 Information Obtained from: Patient Chief Complaint 12/07/2021; left plantar toe wound Electronic Signature(s) Signed: 12/07/2021 3:25:46 PM By: Kalman Shan DO Entered By: Kalman Shan on 12/07/2021 14:13:40 -------------------------------------------------------------------------------- Debridement Details Patient Name: Date of Service: [name], [name] H. 12/07/2021 1:00 PM Medical Record Number: [id number] Patient Account Number: [id number] Date of Birth/Sex: Treating RN: 06/14/1929 (88 y.o. Tonita Phoenix, Lauren Primary Care Provider: Ronnie Doss Other Clinician: Referring Provider: Treating Provider/Extender: Kalman Shan RA KES, LINDA Weeks in Treatment: 0 Debridement Performed for Assessment: Wound #1 Left,Plantar T Great oe Performed By: Physician Kalman Shan, DO Debridement Type: Chemical/Enzymatic/Mechanical Agent Used: gauze and NS Level of Consciousness (Pre-procedure): Awake and Alert Pre-procedure Verification/Time Out No Taken: Start Time: 08:30 Bleeding: Minimum Hemostasis Achieved: Pressure Response to Treatment: Procedure was tolerated well Level of Consciousness (Post- Awake and Alert procedure): Post Debridement Measurements of Total Wound Length: (cm) 1.1 Width: (cm) 0.9 Depth: (cm) 0.2 Volume: (cm) 0.156 Character of Wound/Ulcer Post Debridement: Improved Post Procedure Diagnosis Same as Pre-procedure Electronic Signature(s) Signed: 12/10/2021 10:51:54 AM By: Kalman Shan DO Signed:  12/10/2021 5:29:07 PM By: Rhae Hammock RN Entered By: Rhae Hammock on 12/09/2021 16:37:05 -------------------------------------------------------------------------------- HPI Details Patient Name: Date of Service: [name], Mechel H. 12/07/2021 1:00 PM Medical Record Number: [id number] Patient Account Number: [id number] Date of Birth/Sex: Treating RN: 04/27/1929 (88 y.o. F) Primary Care Provider: Ronnie Doss Other Clinician: Referring Provider: Treating Provider/Extender: Kalman Shan RA KES, LINDA Weeks in Treatment: 0 History of Present Illness HPI Description: Admission 12/07/2021 Ms. Siearra Amberg is a 88 year old female with a past medical history of chronic venous insufficiency, idiopathic peripheral neuropathy, paroxysmal A-fib on Coumadin that presents to the clinic for a 2 week history of nonhealing ulcer to the left plantar great toe. She had a callus to this area that she attempted to remove with scissors and subsequently developed a wound. She currently denies systemic signs of infection. She is keeping the area covered. She is currently on Keflex for left lower extremity cellulitis by her primary care office. Electronic Signature(s) Signed: 12/07/2021 3:25:46 PM By: Kalman Shan DO Entered By: Kalman Shan on 12/07/2021 14:18:25 -------------------------------------------------------------------------------- Physical Exam Details Patient Name: Date of Service: [name], [name] H. 12/07/2021 1:00 PM Medical Record Number: [id number] Patient Account Number: [id number] Date of Birth/Sex: Treating RN: 06-11-1929 (88 y.o. F) Primary Care Provider: Ronnie Doss Other Clinician: Referring Provider: Treating Provider/Extender: Kalman Shan RA KES, LINDA Weeks in Treatment: 0 Constitutional respirations regular, non-labored and within target range for patient.. Cardiovascular 2+ dorsalis pedis/posterior tibialis pulses. Psychiatric pleasant and  cooperative. Notes Left lower extremity: T the plantar left great toe there is an open wound with minimal undermining circumferentially and granulation tissue throughout. No signs o of surrounding infection. Electronic Signature(s) Signed: 12/07/2021 3:25:46 PM By: Kalman Shan DO Entered By: Kalman Shan on 12/07/2021 14:17:30 -------------------------------------------------------------------------------- Physician Orders Details Patient Name: Date of Service: [name], Synthia H. 12/07/2021 1:00 PM Medical Record Number: [id number] Patient Account Number: [id number] Date of Birth/Sex: Treating RN: 1928-07-31 (88 y.o. Tonita Phoenix, Lauren Primary Care Provider: Ronnie Doss Other Clinician: Referring Provider: Treating Provider/Extender: Kalman Shan RA KES, LINDA  Weeks in Treatment: 0 Verbal / Phone Orders: No Diagnosis Coding ICD-10 Coding Code Description L97.522 Non-pressure chronic ulcer of other part of left foot with fat layer exposed T81.33XA Disruption of traumatic injury wound repair, initial encounter G62.9 Polyneuropathy, unspecified Follow-up Appointments ppointment in 1 week. - 12/14/2021 @ 1300 w/ Dr. Heber [place] and Allayne Butcher, Room # 9 Return A Bathing/ Shower/ Hygiene May shower with protection but do not get wound dressing(s) wet. Edema Control - Lymphedema / SCD / Other Elevate legs to the level of the heart or above for 30 minutes daily and/or when sitting, a frequency of: Avoid standing for long periods of time. Off-Loading Open toe surgical shoe to: - LEFT FOOT Wound Treatment Wound #1 - T Great oe Wound Laterality: Plantar, Left Cleanser: Vashe 5.8 (oz) (DME) (Generic) 1 x Per Day/15 Days Discharge Instructions: Cleanse the wound with Vashe prior to applying a clean dressing using gauze sponges, not tissue or cotton balls. Prim Dressing: FIBRACOL Plus Dressing, 2x2 in (collagen) 1 x Per Day/15 Days ary Discharge Instructions: Moisten  collagen with BLASTX Prim Dressing: BlastX, 0.25 (oz), tube ary 1 x Per Day/15 Days Secondary Dressing: Optifoam Non-Adhesive Dressing, 4x4 in (DME) (Generic) 1 x Per Day/15 Days Discharge Instructions: foam donut for offloading Secondary Dressing: Woven Gauze Sponge, Non-Sterile 4x4 in (DME) (Generic) 1 x Per Day/15 Days Discharge Instructions: Apply over primary dressing as directed. Secured With: Child psychotherapist, Sterile 2x75 (in/in) (DME) (Generic) 1 x Per Day/15 Days Discharge Instructions: Secure with stretch gauze as directed. Secured With: 18M Medipore H Soft Cloth Surgical T ape, 4 x 10 (in/yd) (DME) (Generic) 1 x Per Day/15 Days Discharge Instructions: Secure with tape as directed. Add-Ons: Cover-roll Stretch Fixation Dressing, 2x10 (in/yd) (DME) (Generic) 1 x Per Day/15 Days Electronic Signature(s) Signed: 12/07/2021 3:25:46 PM By: Kalman Shan DO Signed: 12/10/2021 5:29:07 PM By: Rhae Hammock RN Entered By: Rhae Hammock on 12/07/2021 14:23:10 -------------------------------------------------------------------------------- Problem List Details Patient Name: Date of Service: [name], [name] H. 12/07/2021 1:00 PM Medical Record Number: [id number] Patient Account Number: [id number] Date of Birth/Sex: Treating RN: [birth year] (88 y.o. F) Primary Care Provider: Ronnie Doss Other Clinician: Referring Provider: Treating Provider/Extender: Kalman Shan RA KES, LINDA Weeks in Treatment: 0 Active Problems ICD-10 Encounter Code Description Active Date MDM Diagnosis L97.522 Non-pressure chronic ulcer of other part of left foot with fat layer exposed 12/07/2021 No Yes G90.09 Other idiopathic peripheral autonomic neuropathy 12/07/2021 No Yes T81.33XA Disruption of traumatic injury wound repair, initial encounter 12/07/2021 No Yes Inactive Problems Resolved Problems Electronic Signature(s) Signed: 12/07/2021 3:25:46 PM By: Kalman Shan  DO Entered By: Kalman Shan on 12/07/2021 14:13:08 -------------------------------------------------------------------------------- Progress Note Details Patient Name: Date of Service: [name], Anona H. 12/07/2021 1:00 PM Medical Record Number: [id number] Patient Account Number: [id number] Date of Birth/Sex: Treating RN: 06-30-1928 (88 y.o. F) Primary Care Provider: Ronnie Doss Other Clinician: Referring Provider: Treating Provider/Extender: Kalman Shan RA KES, LINDA Weeks in Treatment: 0 Subjective Chief Complaint Information obtained from Patient 12/07/2021; left plantar toe wound History of Present Illness (HPI) Admission 12/07/2021 Ms. Jaquay Morneault is a 88 year old female with a past medical history of chronic venous insufficiency, idiopathic peripheral neuropathy, paroxysmal A-fib on Coumadin that presents to the clinic for a 2 week history of nonhealing ulcer to the left plantar great toe. She had a callus to this area that she attempted to remove with scissors and subsequently developed a wound. She currently denies systemic signs of infection. She is keeping the area  covered. She is currently on Keflex for left lower extremity cellulitis by her primary care office. Patient History Information obtained from Patient, Chart. Allergies Statins-HMG-CoA Reductase Inhibitors Family History Unknown History. Social History Never smoker, Marital Status - Widowed, Alcohol Use - Never, Drug Use - No History, Caffeine Use - Rarely. Medical History Cardiovascular Patient has history of Arrhythmia - A Fibb, Hypertension Denies history of Angina, Congestive Heart Failure, Coronary Artery Disease, Deep Vein Thrombosis, Hypotension, Myocardial Infarction, Peripheral Arterial Disease, Peripheral Venous Disease, Phlebitis, Vasculitis Endocrine Denies history of Type I Diabetes, Type II Diabetes Neurologic Patient has history of Neuropathy Hospitalization/Surgery History -  s/p mitral valve repair. - abd. hysterectomy. - cardiac cath.. - pacemaker placemen. Medical A Surgical History Notes nd Cardiovascular Pacemaker, post mitral valve repair, paroxysymal Afibb, sick sinus syndrome, hyperlipidemia, dysplipidemia Endocrine hyperthyroidism Review of Systems (ROS) Constitutional Symptoms (General Health) Denies complaints or symptoms of Fatigue, Fever, Chills, Marked Weight Change. Eyes Denies complaints or symptoms of Dry Eyes, Vision Changes, Glasses / Contacts. Ear/Nose/Mouth/Throat Denies complaints or symptoms of Chronic sinus problems or rhinitis. Respiratory Denies complaints or symptoms of Chronic or frequent coughs, Shortness of Breath. Cardiovascular Denies complaints or symptoms of Chest pain. Gastrointestinal Denies complaints or symptoms of Frequent diarrhea, Nausea, Vomiting. Genitourinary Denies complaints or symptoms of Frequent urination. Integumentary (Skin) Complains or has symptoms of Wounds. Musculoskeletal Denies complaints or symptoms of Muscle Pain, Muscle Weakness. Neurologic Denies complaints or symptoms of Numbness/parasthesias. Psychiatric Denies complaints or symptoms of Claustrophobia, Suicidal. Objective Constitutional respirations regular, non-labored and within target range for patient.. Vitals Time Taken: 1:14 PM, Height: 68 in, Source: Stated, Weight: 146 lbs, Source: Stated, BMI: 22.2, Temperature: 97.8 F, Pulse: 82 bpm, Respiratory Rate: 17 breaths/min, Blood Pressure: 122/75 mmHg. Cardiovascular 2+ dorsalis pedis/posterior tibialis pulses. Psychiatric pleasant and cooperative. General Notes: Left lower extremity: T the plantar left great toe there is an open wound with minimal undermining circumferentially and granulation tissue o throughout. No signs of surrounding infection. Integumentary (Hair, Skin) Wound #1 status is Open. Original cause of wound was Trauma. The date acquired was: 11/26/2021. The wound  is located on the [employer]. The oe wound measures 1.1cm length x 0.9cm width x 0.2cm depth; 0.778cm^2 area and 0.156cm^3 volume. There is Fat Layer (Subcutaneous Tissue) exposed. There is no tunneling noted, however, there is undermining starting at 12:00 and ending at 12:00 with a maximum distance of 0.3cm. There is a medium amount of serosanguineous drainage noted. The wound margin is distinct with the outline attached to the wound base. There is large (67-100%) red, pink, hyper - granulation within the wound bed. There is no necrotic tissue within the wound bed. Assessment Active Problems ICD-10 Non-pressure chronic ulcer of other part of left foot with fat layer exposed Other idiopathic peripheral autonomic neuropathy Disruption of traumatic injury wound repair, initial encounter Patient presents with a 2-week history of nonhealing ulcer to the plantar left great toe. She obtained this wound by using scissors to cut off a callus. She has been keeping the area covered. No signs of infection. She has idiopathic peripheral neuropathy. She is not diabetic. She does not have sensation to her feet. Her ABIs in office was 1.1 on the left and she has palpable pedal pulses Suggesting adequate blood flow for healing. I recommended using blast X and collagen daily to the wound bed. We discussed the importance of aggressive offloading for her wound healing. We will give her a surgical shoe with a foam offloading donut. Follow-up  in 1 week. 47 minutes was spent on the encounter including face-to-face, EMR review and coordination of care Plan Follow-up Appointments: Return Appointment in 1 week. - 12/14/2021 @ 1300 w/ Dr. Heber [place] and Allayne Butcher, Room # 9 Bathing/ Shower/ Hygiene: May shower with protection but do not get wound dressing(s) wet. Edema Control - Lymphedema / SCD / Other: Elevate legs to the level of the heart or above for 30 minutes daily and/or when sitting, a frequency  of: Avoid standing for long periods of time. Off-Loading: Open toe surgical shoe to: - LEFT FOOT WOUND #1: - T Great Wound Laterality: Plantar, Left oe Cleanser: Vashe 5.8 (oz) (DME) (Generic) Every Other Day/15 Days Discharge Instructions: Cleanse the wound with Vashe prior to applying a clean dressing using gauze sponges, not tissue or cotton balls. Prim Dressing: FIBRACOL Plus Dressing, 2x2 in (collagen) Every Other Day/15 Days ary Discharge Instructions: Moisten collagen with BLASTX Prim Dressing: BlastX, 0.25 (oz), tube Every Other Day/15 Days ary Secondary Dressing: Optifoam Non-Adhesive Dressing, 4x4 in (DME) (Generic) Every Other Day/15 Days Discharge Instructions: foam donut for offloading Secondary Dressing: Woven Gauze Sponge, Non-Sterile 4x4 in (DME) (Generic) Every Other Day/15 Days Discharge Instructions: Apply over primary dressing as directed. Secured With: Child psychotherapist, Sterile 2x75 (in/in) (DME) (Generic) Every Other Day/15 Days Discharge Instructions: Secure with stretch gauze as directed. Secured With: 63M Medipore H Soft Cloth Surgical T ape, 4 x 10 (in/yd) (DME) (Generic) Every Other Day/15 Days Discharge Instructions: Secure with tape as directed. 1. Blastx and collagen 2. Aggressive offloadingoosurgical shoe and offloading donut 3. Follow-up in 1 week Electronic Signature(s) Signed: 12/07/2021 3:25:46 PM By: Kalman Shan DO Entered By: Kalman Shan on 12/07/2021 14:22:46 -------------------------------------------------------------------------------- HxROS Details Patient Name: Date of Service: [name], [name] H. 12/07/2021 1:00 PM Medical Record Number: [id number] Patient Account Number: [id number] Date of Birth/Sex: Treating RN: 1928-12-06 (88 y.o. Tonita Phoenix, Lauren Primary Care Provider: Ronnie Doss Other Clinician: Referring Provider: Treating Provider/Extender: Kalman Shan RA KES, LINDA Weeks in Treatment:  0 Information Obtained From Patient Chart Constitutional Symptoms (General Health) Complaints and Symptoms: Negative for: Fatigue; Fever; Chills; Marked Weight Change Eyes Complaints and Symptoms: Negative for: Dry Eyes; Vision Changes; Glasses / Contacts Ear/Nose/Mouth/Throat Complaints and Symptoms: Negative for: Chronic sinus problems or rhinitis Respiratory Complaints and Symptoms: Negative for: Chronic or frequent coughs; Shortness of Breath Cardiovascular Complaints and Symptoms: Negative for: Chest pain Medical History: Positive for: Arrhythmia - A Fibb; Hypertension Negative for: Angina; Congestive Heart Failure; Coronary Artery Disease; Deep Vein Thrombosis; Hypotension; Myocardial Infarction; Peripheral Arterial Disease; Peripheral Venous Disease; Phlebitis; Vasculitis Past Medical History Notes: Pacemaker, post mitral valve repair, paroxysymal Afibb, sick sinus syndrome, hyperlipidemia, dysplipidemia Gastrointestinal Complaints and Symptoms: Negative for: Frequent diarrhea; Nausea; Vomiting Genitourinary Complaints and Symptoms: Negative for: Frequent urination Integumentary (Skin) Complaints and Symptoms: Positive for: Wounds Musculoskeletal Complaints and Symptoms: Negative for: Muscle Pain; Muscle Weakness Neurologic Complaints and Symptoms: Negative for: Numbness/parasthesias Medical History: Positive for: Neuropathy Psychiatric Complaints and Symptoms: Negative for: Claustrophobia; Suicidal Hematologic/Lymphatic Endocrine Medical History: Negative for: Type I Diabetes; Type II Diabetes Past Medical History Notes: hyperthyroidism Immunological Oncologic Immunizations Pneumococcal Vaccine: Received Pneumococcal Vaccination: Yes Received Pneumococcal Vaccination On or After 60th Birthday: Yes Implantable Devices Yes Hospitalization / Surgery History Type of Hospitalization/Surgery s/p mitral valve repair abd. hysterectomy cardiac  cath. pacemaker placemen Family and Social History Unknown History: Yes; Never smoker; Marital Status - Widowed; Alcohol Use: Never; Drug Use: No History; Caffeine Use: Rarely; Financial Concerns: No; Food,  Clothing or Shelter Needs: No; Support System Lacking: No; Transportation Concerns: No Electronic Signature(s) Signed: 12/07/2021 3:25:46 PM By: Kalman Shan DO Signed: 12/10/2021 5:29:07 PM By: Rhae Hammock RN Entered By: Rhae Hammock on 12/07/2021 13:20:24 -------------------------------------------------------------------------------- SuperBill Details Patient Name: Date of Service: [name], Theadora H. 12/07/2021 Medical Record Number: [id number] Patient Account Number: [id number] Date of Birth/Sex: Treating RN: Jul 04, 1928 (88 y.o. Tonita Phoenix, Lauren Primary Care Provider: Ronnie Doss Other Clinician: Referring Provider: Treating Provider/Extender: Kalman Shan RA KES, LINDA Weeks in Treatment: 0 Diagnosis Coding ICD-10 Codes Code Description [phone] Non-pressure chronic ulcer of other part of left foot with fat layer exposed G90.09 Other idiopathic peripheral autonomic neuropathy T81.33XA Disruption of traumatic injury wound repair, initial encounter Facility Procedures CPT4 Code: [id number] Description: 99214 - WOUND CARE VISIT-LEV 4 EST PT Modifier: Quantity: 1 Physician Procedures : CPT4 Code Description Modifier [id number] - WC PHYS LEVEL 4 - NEW PT ICD-10 Diagnosis Description L97.522 Non-pressure chronic ulcer of other part of left foot with fat layer exposed T81.33XA Disruption of traumatic injury wound repair, initial  encounter G90.09 Other idiopathic peripheral autonomic neuropathy Quantity: 1 Electronic Signature(s) Signed: 12/07/2021 3:25:46 PM By: Kalman Shan DO Entered By: Kalman Shan on 12/07/2021 14:23:03

## 2021-12-10 NOTE — Progress Notes (Signed)
[name], [name] ([id number]) Visit Report for 12/07/2021 Allergy List Details Patient Name: Date of Service: [name], [name] 12/07/2021 1:00 PM Medical Record Number: [id number] Patient Account Number: [id number] Date of Birth/Sex: Treating RN: 1928-09-09 (88 y.o. [name], [name] Primary Care Balbina Depace: Ronnie Doss Other Clinician: Referring Thor Nannini: Treating Vanecia Limpert/Extender: Kalman Shan RA KES, LINDA Weeks in Treatment: 0 Allergies Active Allergies Statins-HMG-CoA Reductase Inhibitors Allergy Notes Electronic Signature(s) Signed: 12/10/2021 5:29:07 PM By: Rhae Hammock RN Entered By: Rhae Hammock on 12/07/2021 13:15:44 -------------------------------------------------------------------------------- Arrival Information Details Patient Name: Date of Service: [name], [name] 12/07/2021 1:00 PM Medical Record Number: [id number] Patient Account Number: [id number] Date of Birth/Sex: Treating RN: [birth year] (88 y.o. [name], [name] Primary Care Roselynn Whitacre: Ronnie Doss Other Clinician: Referring Brynleigh Sequeira: Treating Aracelia Brinson/Extender: Kalman Shan RA KES, LINDA Weeks in Treatment: 0 Visit Information Patient Arrived: Wheel Chair Arrival Time: 13:13 Accompanied By: son Transfer Assistance: Manual Patient Identification Verified: Yes Secondary Verification Process Completed: Yes Patient Requires Transmission-Based Precautions: No Patient Has Alerts: Yes Patient Alerts: Patient on Blood Thinner Electronic Signature(s) Signed: 12/10/2021 5:29:07 PM By: Rhae Hammock RN Entered By: Rhae Hammock on 12/07/2021 14:01:25 -------------------------------------------------------------------------------- Clinic Level of Care Assessment Details Patient Name: Date of Service: [name], [name] 12/07/2021 1:00 PM Medical Record Number: [id number] Patient Account Number: [id number] Date of Birth/Sex: Treating RN: Jun 10, 1929 (88 y.o. [name], [name] Primary Care Ressie Slevin: Ronnie Doss Other Clinician: Referring Javani Spratt: Treating Aunesty Tyson/Extender: Kalman Shan RA KES, LINDA Weeks in Treatment: 0 Clinic Level of Care Assessment Items TOOL 4 Quantity Score X- 1 0 Use when only an EandM is performed on FOLLOW-UP visit ASSESSMENTS - Nursing Assessment / Reassessment X- 1 10 Reassessment of Co-morbidities (includes updates in patient status) X- 1 5 Reassessment of Adherence to Treatment Plan ASSESSMENTS - Wound and Skin A ssessment / Reassessment X - Simple Wound Assessment / Reassessment - one wound 1 5 '[]'$  - 0 Complex Wound Assessment / Reassessment - multiple wounds '[]'$  - 0 Dermatologic / Skin Assessment (not related to wound area) ASSESSMENTS - Focused Assessment X- 1 5 Circumferential Edema Measurements - multi extremities '[]'$  - 0 Nutritional Assessment / Counseling / Intervention '[]'$  - 0 Lower Extremity Assessment (monofilament, tuning fork, pulses) '[]'$  - 0 Peripheral Arterial Disease Assessment (using hand held doppler) ASSESSMENTS - Ostomy and/or Continence Assessment and Care '[]'$  - 0 Incontinence Assessment and Management '[]'$  - 0 Ostomy Care Assessment and Management (repouching, etc.) PROCESS - Coordination of Care X - Simple Patient / Family Education for ongoing care 1 15 '[]'$  - 0 Complex (extensive) Patient / Family Education for ongoing care X- 1 10 Staff obtains Programmer, systems, Records, T Results / Process Orders est '[]'$  - 0 Staff telephones HHA, Nursing Homes / Clarify orders / etc '[]'$  - 0 Routine Transfer to another Facility (non-emergent condition) '[]'$  - 0 Routine Hospital Admission (non-emergent condition) X- 1 15 New Admissions / Biomedical engineer / Ordering NPWT Apligraf, etc. , '[]'$  - 0 Emergency Hospital Admission (emergent condition) X- 1 10 Simple Discharge Coordination '[]'$  - 0 Complex (extensive) Discharge Coordination PROCESS - Special Needs '[]'$  - 0 Pediatric / Minor  Patient Management '[]'$  - 0 Isolation Patient Management '[]'$  - 0 Hearing / Language / Visual special needs '[]'$  - 0 Assessment of Community assistance (transportation, D/C planning, etc.) '[]'$  - 0 Additional assistance / Altered mentation '[]'$  - 0 Support Surface(s) Assessment (bed, cushion, seat, etc.) INTERVENTIONS - Wound Cleansing / Measurement X - Simple Wound Cleansing - one  wound 1 5 '[]'$  - 0 Complex Wound Cleansing - multiple wounds X- 1 5 Wound Imaging (photographs - any number of wounds) '[]'$  - 0 Wound Tracing (instead of photographs) X- 1 5 Simple Wound Measurement - one wound '[]'$  - 0 Complex Wound Measurement - multiple wounds INTERVENTIONS - Wound Dressings X - Small Wound Dressing one or multiple wounds 1 10 '[]'$  - 0 Medium Wound Dressing one or multiple wounds '[]'$  - 0 Large Wound Dressing one or multiple wounds '[]'$  - 0 Application of Medications - topical '[]'$  - 0 Application of Medications - injection INTERVENTIONS - Miscellaneous '[]'$  - 0 External ear exam '[]'$  - 0 Specimen Collection (cultures, biopsies, blood, body fluids, etc.) '[]'$  - 0 Specimen(s) / Culture(s) sent or taken to Lab for analysis '[]'$  - 0 Patient Transfer (multiple staff / Civil Service fast streamer / Similar devices) '[]'$  - 0 Simple Staple / Suture removal (25 or less) '[]'$  - 0 Complex Staple / Suture removal (26 or more) '[]'$  - 0 Hypo / Hyperglycemic Management (close monitor of Blood Glucose) X- 1 15 Ankle / Brachial Index (ABI) - do not check if billed separately X- 1 5 Vital Signs Has the patient been seen at the hospital within the last three years: Yes Total Score: 120 Level Of Care: New/Established - Level 4 Electronic Signature(s) Signed: 12/10/2021 5:29:07 PM By: Rhae Hammock RN Entered By: Rhae Hammock on 12/07/2021 14:09:27 -------------------------------------------------------------------------------- Encounter Discharge Information Details Patient Name: Date of Service: [name], [name]  12/07/2021 1:00 PM Medical Record Number: [id number] Patient Account Number: [id number] Date of Birth/Sex: Treating RN: June 21, 1929 (88 y.o. [name], [name] Primary Care Tyrena Gohr: Ronnie Doss Other Clinician: Referring Beren Yniguez: Treating Oletta Buehring/Extender: Kalman Shan RA KES, LINDA Weeks in Treatment: 0 Encounter Discharge Information Items Discharge Condition: Stable Ambulatory Status: Wheelchair Discharge Destination: Home Transportation: Private Auto Accompanied By: son Schedule Follow-up Appointment: Yes Clinical Summary of Care: Patient Declined Electronic Signature(s) Signed: 12/10/2021 5:29:07 PM By: Rhae Hammock RN Entered By: Rhae Hammock on 12/07/2021 16:22:30 -------------------------------------------------------------------------------- Lower Extremity Assessment Details Patient Name: Date of Service: [name], Tinya H. 12/07/2021 1:00 PM Medical Record Number: [id number] Patient Account Number: [id number] Date of Birth/Sex: Treating RN: Nov 03, 1928 (88 y.o. [name], [name] Primary Care Purvi Ruehl: Ronnie Doss Other Clinician: Referring Jacoria Keiffer: Treating Ailanie Ruttan/Extender: Kalman Shan RA KES, LINDA Weeks in Treatment: 0 Edema Assessment Assessed: [Left: Yes] [Right: No] Edema: [Left: Ye] [Right: s] Calf Left: Right: Point of Measurement: 27 cm From Medial Instep 38 cm Ankle Left: Right: Point of Measurement: 9 cm From Medial Instep 31 cm Knee To Floor Left: Right: From Medial Instep 40 cm Vascular Assessment Pulses: Dorsalis Pedis Palpable: [Left:Yes] Posterior Tibial Palpable: [Left:Yes] Blood Pressure: Brachial: [Left:127] Ankle: [Left:Dorsalis Pedis: 140 1.10] Electronic Signature(s) Signed: 12/10/2021 5:29:07 PM By: Rhae Hammock RN Entered By: Rhae Hammock on 12/07/2021 13:20:47 -------------------------------------------------------------------------------- Multi Wound Chart Details Patient  Name: Date of Service: [name], Lexia H. 12/07/2021 1:00 PM Medical Record Number: [id number] Patient Account Number: [id number] Date of Birth/Sex: Treating RN: 1928/07/19 (88 y.o. F) Primary Care Yarielis Funaro: Ronnie Doss Other Clinician: Referring Abdirahman Chittum: Treating Takeem Krotzer/Extender: Kalman Shan RA KES, LINDA Weeks in Treatment: 0 Vital Signs Height(in): 68 Pulse(bpm): 19 Weight(lbs): 146 Blood Pressure(mmHg): 122/75 Body Mass Index(BMI): 22.2 Temperature(F): 97.8 Respiratory Rate(breaths/min): 17 Photos: [N/A:N/A] Left, Plantar T Great oe N/A N/A Wound Location: Trauma N/A N/A Wounding Event: Trauma, Other N/A N/A Primary Etiology: Arrhythmia, Hypertension, Neuropathy N/A N/A Comorbid History: 11/26/2021 N/A N/A Date Acquired: 0 N/A N/A  Weeks of Treatment: Open N/A N/A Wound Status: No N/A N/A Wound Recurrence: 1.1x0.9x0.2 N/A N/A Measurements L x W x D (cm) 0.778 N/A N/A A (cm) : rea 0.156 N/A N/A Volume (cm) : 0.00% N/A N/A % Reduction in A rea: 0.00% N/A N/A % Reduction in Volume: 12 Starting Position 1 (o'clock): 12 Ending Position 1 (o'clock): 0.3 Maximum Distance 1 (cm): Yes N/A N/A Undermining: Full Thickness With Exposed Support N/A N/A Classification: Structures Medium N/A N/A Exudate Amount: Serosanguineous N/A N/A Exudate Type: red, brown N/A N/A Exudate Color: Distinct, outline attached N/A N/A Wound Margin: Large (67-100%) N/A N/A Granulation Amount: Red, Pink, Hyper-granulation N/A N/A Granulation Quality: None Present (0%) N/A N/A Necrotic Amount: Fat Layer (Subcutaneous Tissue): Yes N/A N/A Exposed Structures: Fascia: No Tendon: No Muscle: No Joint: No Bone: No None N/A N/A Epithelialization: Treatment Notes Electronic Signature(s) Signed: 12/07/2021 3:25:46 PM By: Kalman Shan DO Entered By: Kalman Shan on 12/07/2021  14:13:14 -------------------------------------------------------------------------------- Multi-Disciplinary Care Plan Details Patient Name: Date of Service: [name], Dajon H. 12/07/2021 1:00 PM Medical Record Number: [id number] Patient Account Number: [id number] Date of Birth/Sex: Treating RN: 1929/02/28 (88 y.o. [name], [name] Primary Care Kaynan Klonowski: Ronnie Doss Other Clinician: Referring Jamiyla Ishee: Treating Trannie Bardales/Extender: Kalman Shan RA KES, LINDA Weeks in Treatment: 0 Active Inactive Orientation to the Wound Care Program Nursing Diagnoses: Knowledge deficit related to the wound healing center program Goals: Patient/caregiver will verbalize understanding of the [facility] Date Initiated: 12/07/2021 Target Resolution Date: 01/01/2022 Goal Status: Active Interventions: Provide education on orientation to the wound center Notes: Wound/Skin Impairment Nursing Diagnoses: Impaired tissue integrity Knowledge deficit related to ulceration/compromised skin integrity Goals: Patient will have a decrease in wound volume by X% from date: (specify in notes) Date Initiated: 12/07/2021 Target Resolution Date: 01/01/2022 Goal Status: Active Patient/caregiver will verbalize understanding of skin care regimen Date Initiated: 12/07/2021 Target Resolution Date: 01/01/2022 Goal Status: Active Ulcer/skin breakdown will have a volume reduction of 30% by week 4 Date Initiated: 12/07/2021 Target Resolution Date: 01/01/2022 Goal Status: Active Interventions: Assess patient/caregiver ability to obtain necessary supplies Assess patient/caregiver ability to perform ulcer/skin care regimen upon admission and as needed Assess ulceration(s) every visit Notes: Electronic Signature(s) Signed: 12/10/2021 5:29:07 PM By: Rhae Hammock RN Entered By: Rhae Hammock on 12/07/2021  13:27:44 -------------------------------------------------------------------------------- Pain Assessment Details Patient Name: Date of Service: [name], Gwynn H. 12/07/2021 1:00 PM Medical Record Number: [id number] Patient Account Number: [id number] Date of Birth/Sex: Treating RN: Jan 20, 1929 (88 y.o. [name], [name] Primary Care Daralyn Bert: Ronnie Doss Other Clinician: Referring Morganne Haile: Treating Fiorela Pelzer/Extender: Kalman Shan RA KES, LINDA Weeks in Treatment: 0 Active Problems Location of Pain Severity and Description of Pain Patient Has Paino No Site Locations Pain Management and Medication Current Pain Management: Electronic Signature(s) Signed: 12/10/2021 5:29:07 PM By: Rhae Hammock RN Entered By: Rhae Hammock on 12/07/2021 13:17:26 -------------------------------------------------------------------------------- Patient/Caregiver Education Details Patient Name: Date of Service: [name], Alaycia H. 6/12/2023andnbsp1:00 PM Medical Record Number: [id number] Patient Account Number: [id number] Date of Birth/Gender: Treating RN: March 17, 1929 (88 y.o. [name] Primary Care Physician: Ronnie Doss Other Clinician: Referring Physician: Treating Physician/Extender: Kalman Shan RA KES, LINDA Weeks in Treatment: 0 Education Assessment Education Provided To: Patient Education Topics Provided Welcome T [facility]: o Methods: Explain/Verbal Responses: Reinforcements needed, State content correctly Electronic Signature(s) Signed: 12/10/2021 5:29:07 PM By: Rhae Hammock RN Entered By: Rhae Hammock on 12/07/2021 13:27:53 -------------------------------------------------------------------------------- Wound Assessment Details Patient Name: Date of Service: [name],  Amelia H. 12/07/2021 1:00 PM Medical Record Number: [id number] Patient Account Number: [id number] Date of Birth/Sex: Treating RN: May 04, 1929 (88 y.o. [name], [name] Primary Care Genessis Flanary: Ronnie Doss Other Clinician: Referring Damarkus Balis: Treating Fidencio Duddy/Extender: Kalman Shan RA KES, LINDA Weeks in Treatment: 0 Wound Status Wound Number: 1 Primary Etiology: Trauma, Other Wound Location: Left, Plantar T Great oe Wound Status: Open Wounding Event: Trauma Comorbid History: Arrhythmia, Hypertension, Neuropathy Date Acquired: 11/26/2021 Weeks Of Treatment: 0 Clustered Wound: No Photos Wound Measurements Length: (cm) 1.1 Width: (cm) 0.9 Depth: (cm) 0.2 Area: (cm) 0.778 Volume: (cm) 0.156 % Reduction in Area: 0% % Reduction in Volume: 0% Epithelialization: None Tunneling: No Undermining: Yes Starting Position (o'clock): 12 Ending Position (o'clock): 12 Maximum Distance: (cm) 0.3 Wound Description Classification: Full Thickness With Exposed Support Structures Wound Margin: Distinct, outline attached Exudate Amount: Medium Exudate Type: Serosanguineous Exudate Color: red, brown Foul Odor After Cleansing: No Slough/Fibrino Yes Wound Bed Granulation Amount: Large (67-100%) Exposed Structure Granulation Quality: Red, Pink, Hyper-granulation Fascia Exposed: No Necrotic Amount: None Present (0%) Fat Layer (Subcutaneous Tissue) Exposed: Yes Tendon Exposed: No Muscle Exposed: No Joint Exposed: No Bone Exposed: No Treatment Notes Wound #1 (Toe Great) Wound Laterality: Plantar, Left Cleanser Vashe 5.8 (oz) Discharge Instruction: Cleanse the wound with Vashe prior to applying a clean dressing using gauze sponges, not tissue or cotton balls. Peri-Wound Care Topical Primary Dressing FIBRACOL Plus Dressing, 2x2 in (collagen) Discharge Instruction: Moisten collagen with BLASTX BlastX, 0.25 (oz), tube Secondary Dressing Optifoam Non-Adhesive Dressing, 4x4 in Discharge Instruction: foam donut for offloading Woven Gauze Sponge, Non-Sterile 4x4 in Discharge Instruction: Apply over primary dressing as  directed. Secured With Conforming Stretch Gauze Bandage, Sterile 2x75 (in/in) Discharge Instruction: Secure with stretch gauze as directed. 52M Medipore H Soft Cloth Surgical T ape, 4 x 10 (in/yd) Discharge Instruction: Secure with tape as directed. Compression Wrap Compression Stockings Add-Ons Cover-roll Stretch Fixation Dressing, 2x10 (in/yd) Electronic Signature(s) Signed: 12/07/2021 4:29:25 PM By: Deon Pilling RN, BSN Signed: 12/10/2021 5:29:07 PM By: Rhae Hammock RN Entered By: Deon Pilling on 12/07/2021 13:22:14 -------------------------------------------------------------------------------- Vitals Details Patient Name: Date of Service: [name], Dellene H. 12/07/2021 1:00 PM Medical Record Number: [id number] Patient Account Number: [id number] Date of Birth/Sex: Treating RN: September 27, 1928 (88 y.o. [name], [name] Primary Care Kemarion Abbey: Ronnie Doss Other Clinician: Referring Cythnia Osmun: Treating Tayden Duran/Extender: Kalman Shan RA KES, LINDA Weeks in Treatment: 0 Vital Signs Time Taken: 13:14 Temperature (F): 97.8 Height (in): 68 Pulse (bpm): 82 Source: Stated Respiratory Rate (breaths/min): 17 Weight (lbs): 146 Blood Pressure (mmHg): 122/75 Source: Stated Reference Range: 80 - 120 mg / dl Body Mass Index (BMI): 22.2 Electronic Signature(s) Signed: 12/10/2021 5:29:07 PM By: Rhae Hammock RN Entered By: Rhae Hammock on 12/07/2021 13:15:25

## 2021-12-10 NOTE — Progress Notes (Signed)
[name], [name] ([id number]) Visit Report for 12/07/2021 Abuse Risk Screen Details Patient Name: Date of Service: [name], [name] 12/07/2021 1:00 PM Medical Record Number: [id number] Patient Account Number: [id number] Date of Birth/Sex: Treating RN: 06/15/1929 (88 y.o. [name], [name] Primary Care [name]: [name] Other Clinician: Referring [name]: Treating [name]/Extender: [name] Weeks in Treatment: 0 Abuse Risk Screen Items Answer ABUSE RISK SCREEN: Has anyone close to you tried to hurt or harm you recentlyo No Do you feel uncomfortable with anyone in your familyo No Has anyone forced you do things that you didnt want to doo No Electronic Signature(s) Signed: 12/10/2021 5:29:07 PM By: Rhae Hammock RN Entered By: Rhae Hammock on 12/07/2021 13:16:34 -------------------------------------------------------------------------------- Activities of Daily Living Details Patient Name: Date of Service: [name], [name] 12/07/2021 1:00 PM Medical Record Number: [id number] Patient Account Number: [id number] Date of Birth/Sex: Treating RN: 1928/08/12 (88 y.o. [name], [name] Primary Care [name]: [name] Other Clinician: Referring Paz Winsett: Treating [name]/Extender: [name] Weeks in Treatment: 0 Activities of Daily Living Items Answer Activities of Daily Living (Please select one for each item) Drive Automobile Not Able T Medications ake Completely Able Use T elephone Completely Able Care for Appearance Completely Able Use T oilet Completely Able Bath / Shower Completely Able Dress Self Completely Able Feed Self Completely Able Walk Completely Able Get In / Out Bed Completely Able Housework Completely Able Prepare Meals Completely [place] Completely Able Shop for Self Completely Able Electronic Signature(s) Signed: 12/10/2021 5:29:07 PM By: Rhae Hammock RN Entered By:  Rhae Hammock on 12/07/2021 13:16:50 -------------------------------------------------------------------------------- Education Screening Details Patient Name: Date of Service: [name], [name] 12/07/2021 1:00 PM Medical Record Number: [id number] Patient Account Number: [id number] Date of Birth/Sex: Treating RN: July 14, 1928 (88 y.o. [name], [name] Primary Care [name]: [name] Other Clinician: Referring [name]: Treating [name]/Extender: [name] Weeks in Treatment: 0 Primary Learner Assessed: Patient Learning Preferences/Education Level/Primary Language Learning Preference: Explanation, Demonstration, Communication Board, Printed Material Highest Education Level: High School Preferred Language: English Cognitive Barrier Language Barrier: No Translator Needed: No Memory Deficit: No Emotional Barrier: No Cultural/Religious Beliefs Affecting Medical Care: No Physical Barrier Impaired Vision: Yes Glasses Impaired Hearing: No Decreased Hand dexterity: No Knowledge/Comprehension Knowledge Level: High Comprehension Level: High Ability to understand written instructions: High Ability to understand verbal instructions: High Motivation Anxiety Level: Calm Cooperation: Cooperative Education Importance: Denies Need Interest in Health Problems: Asks Questions Perception: Coherent Willingness to Engage in Self-Management High Activities: Readiness to Engage in Self-Management High Activities: Electronic Signature(s) Signed: 12/10/2021 5:29:07 PM By: Rhae Hammock RN Entered By: Rhae Hammock on 12/07/2021 13:17:02 -------------------------------------------------------------------------------- Fall Risk Assessment Details Patient Name: Date of Service: [name], [name] 12/07/2021 1:00 PM Medical Record Number: [id number] Patient Account Number: [id number] Date of Birth/Sex: Treating RN: [birth year] (88 y.o. [name],  [name] Primary Care [name]: [name] Other Clinician: Referring [name]: Treating [name]/Extender: [name] Weeks in Treatment: 0 Fall Risk Assessment Items Have you had 2 or more falls in the last 12 monthso 0 No Have you had any fall that resulted in injury in the last 12 monthso 0 No FALLS RISK SCREEN History of falling - immediate or within 3 months 0 No Secondary diagnosis (Do you have 2 or more medical diagnoseso) 0 No Ambulatory aid None/bed rest/wheelchair/nurse 0 No Crutches/cane/walker 0 No Furniture 0 No Intravenous therapy Access/Saline/Heparin Lock 0 No Gait/Transferring Normal/ bed  rest/ wheelchair 0 No Weak (short steps with or without shuffle, stooped but able to lift head while walking, may seek 0 No support from furniture) Impaired (short steps with shuffle, may have difficulty arising from chair, head down, impaired 0 No balance) Mental Status Oriented to own ability 0 No Electronic Signature(s) Signed: 12/10/2021 5:29:07 PM By: Rhae Hammock RN Entered By: Rhae Hammock on 12/07/2021 13:17:13 -------------------------------------------------------------------------------- Foot Assessment Details Patient Name: Date of Service: [name], [name] 12/07/2021 1:00 PM Medical Record Number: [id number] Patient Account Number: [id number] Date of Birth/Sex: Treating RN: 06-24-1929 (88 y.o. [name], [name] Primary Care [name]: [name] Other Clinician: Referring [name]: Treating [name]/Extender: [name] Weeks in Treatment: 0 Foot Assessment Items Site Locations + = Sensation present, - = Sensation absent, C = Callus, U = Ulcer R = Redness, W = Warmth, M = Maceration, PU = Pre-ulcerative lesion F = Fissure, S = Swelling, D = Dryness Assessment Right: Left: Other Deformity: No No Prior Foot Ulcer: No No Prior Amputation: No No Charcot Joint: No No Ambulatory Status:  Ambulatory With Help Assistance Device: Wheelchair Gait: Steady Electronic Signature(s) Signed: 12/10/2021 5:29:07 PM By: Rhae Hammock RN Entered By: Rhae Hammock on 12/07/2021 13:21:40 -------------------------------------------------------------------------------- Nutrition Risk Screening Details Patient Name: Date of Service: [name], Noela H. 12/07/2021 1:00 PM Medical Record Number: [id number] Patient Account Number: [id number] Date of Birth/Sex: Treating RN: 04-02-1929 (88 y.o. [name], [name] Primary Care Brannen Koppen: [name] Other Clinician: Referring Ema Hebner: Treating Wasif Simonich/Extender: [name] Weeks in Treatment: 0 Height (in): 68 Weight (lbs): 146 Body Mass Index (BMI): 22.2 Nutrition Risk Screening Items Score Screening NUTRITION RISK SCREEN: I have an illness or condition that made me change the kind and/or amount of food I eat 0 No I eat fewer than two meals per day 0 No I eat few fruits and vegetables, or milk products 0 No I have three or more drinks of beer, liquor or wine almost every day 0 No I have tooth or mouth problems that make it hard for me to eat 0 No I don't always have enough money to buy the food I need 0 No I eat alone most of the time 0 No I take three or more different prescribed or over-the-counter drugs a day 0 No Without wanting to, I have lost or gained 10 pounds in the last six months 0 No I am not always physically able to shop, cook and/or feed myself 0 No Nutrition Protocols Good Risk Protocol 0 No interventions needed Moderate Risk Protocol High Risk Proctocol Risk Level: Good Risk Score: 0 Electronic Signature(s) Signed: 12/10/2021 5:29:07 PM By: Rhae Hammock RN Entered By: Rhae Hammock on 12/07/2021 13:17:19

## 2021-12-14 ENCOUNTER — Encounter (HOSPITAL_BASED_OUTPATIENT_CLINIC_OR_DEPARTMENT_OTHER): Payer: MEDICARE | Admitting: Internal Medicine

## 2021-12-14 DIAGNOSIS — L97522 Non-pressure chronic ulcer of other part of left foot with fat layer exposed: Secondary | ICD-10-CM | POA: Diagnosis not present

## 2021-12-14 DIAGNOSIS — I480 Paroxysmal atrial fibrillation: Secondary | ICD-10-CM | POA: Diagnosis not present

## 2021-12-14 DIAGNOSIS — I872 Venous insufficiency (chronic) (peripheral): Secondary | ICD-10-CM | POA: Diagnosis not present

## 2021-12-14 DIAGNOSIS — T8133XA Disruption of traumatic injury wound repair, initial encounter: Secondary | ICD-10-CM | POA: Diagnosis not present

## 2021-12-14 DIAGNOSIS — Z7901 Long term (current) use of anticoagulants: Secondary | ICD-10-CM | POA: Diagnosis not present

## 2021-12-14 DIAGNOSIS — G629 Polyneuropathy, unspecified: Secondary | ICD-10-CM | POA: Diagnosis not present

## 2021-12-15 NOTE — Progress Notes (Signed)
Remote pacemaker transmission.   

## 2021-12-16 NOTE — Progress Notes (Signed)
[name], [name] ([id number]) Visit Report for 12/14/2021 Chief Complaint Document Details Patient Name: Date of Service: HO LCO [name], [name] 12/14/2021 1:00 PM Medical Record Number: [id number] Patient Account Number: [id number] Date of Birth/Sex: Treating RN: 01/05/1929 (88 y.o. [name], [name] Primary Care Provider: Ronnie [name] Other Clinician: Referring Provider: Treating Provider/Extender: [name] in Treatment: 1 Information Obtained from: Patient Chief Complaint 12/07/2021; left plantar toe wound Electronic Signature(s) Signed: 12/14/2021 2:03:34 PM By: Melissa Shan DO Entered By: [name] on 12/14/2021 13:53:39 -------------------------------------------------------------------------------- Debridement Details Patient Name: Date of Service: HO LCO [name], [name] 12/14/2021 1:00 PM Medical Record Number: [id number] Patient Account Number: [id number] Date of Birth/Sex: Treating RN: 04/25/1929 (88 y.o. [name], [name] Primary Care Provider: Ronnie [name] Other Clinician: Referring Provider: Treating Provider/Extender: [name] in Treatment: 1 Debridement Performed for Assessment: Wound #1 Left,Plantar T Great oe Performed By: Physician Melissa Shan, DO Debridement Type: Debridement Level of Consciousness (Pre-procedure): Awake and Alert Pre-procedure Verification/Time Out Yes - 13:15 Taken: Start Time: 13:16 Pain Control: Lidocaine 5% topical ointment T Area Debrided (L x W): otal 1 (cm) x 1 (cm) = 1 (cm) Viable, Non-Viable, Callus, Slough, Subcutaneous, Skin: Dermis , Skin: Epidermis, Biofilm, Slough, Hyper- Tissue and other material debrided: granulation Level: Skin/Subcutaneous Tissue Debridement Description: Excisional Instrument: Curette Bleeding: Minimum Hemostasis Achieved: Pressure End Time: 13:20 Procedural Pain: 0 Post Procedural Pain: 0 Response to Treatment:  Procedure was tolerated well Level of Consciousness (Post- Awake and Alert procedure): Post Debridement Measurements of Total Wound Length: (cm) 0.7 Width: (cm) 0.8 Depth: (cm) 0.3 Volume: (cm) 0.132 Character of Wound/Ulcer Post Debridement: Improved Post Procedure Diagnosis Same as Pre-procedure Electronic Signature(s) Signed: 12/14/2021 2:03:34 PM By: Melissa Shan DO Signed: 12/14/2021 5:44:49 PM By: Melissa Pilling RN, BSN Entered By: [name] on 12/14/2021 13:20:17 -------------------------------------------------------------------------------- HPI Details Patient Name: Date of Service: HO LCO [name], [name] 12/14/2021 1:00 PM Medical Record Number: [id number] Patient Account Number: [id number] Date of Birth/Sex: Treating RN: 20-Nov-1928 (88 y.o. [name] Primary Care Provider: Ronnie [name] Other Clinician: Referring Provider: Treating Provider/Extender: [name] in Treatment: 1 History of Present Illness HPI Description: Admission 12/07/2021 Ms. [name] is a 88 year old female with a past medical history of chronic venous insufficiency, idiopathic peripheral neuropathy, paroxysmal A-fib on Coumadin that presents to the clinic for a 2 week history of nonhealing ulcer to the left plantar great toe. She had a callus to this area that she attempted to remove with scissors and subsequently developed a wound. She currently denies systemic signs of infection. She is keeping the area covered. She is currently on Keflex for left lower extremity cellulitis by her primary care office. 6/19; patient presents for follow-up. She obtained her blast X and collagen and has been using this daily. She has no issues or complaints today. She denies signs of infection. Electronic Signature(s) Signed: 12/14/2021 2:03:34 PM By: Melissa Shan DO Entered By: [name] on 12/14/2021  13:53:58 -------------------------------------------------------------------------------- Physical Exam Details Patient Name: Date of Service: HO LCO [name], [name] 12/14/2021 1:00 PM Medical Record Number: [id number] Patient Account Number: [id number] Date of Birth/Sex: Treating RN: 06/20/1929 (88 y.o. [name] Primary Care Provider: Ronnie [name] Other Clinician: Referring Provider: Treating Provider/Extender: [name] in Treatment: 1 Constitutional respirations regular, non-labored and within target range for patient.. Cardiovascular 2+ dorsalis pedis/posterior tibialis pulses. Psychiatric pleasant and cooperative. Notes Left lower extremity: T the plantar left great toe  there is an open wound with undermining circumferentially. There is hyper granulated tissue with nonviable o tissue present. No surrounding signs of infection. Electronic Signature(s) Signed: 12/14/2021 2:03:34 PM By: Melissa Shan DO Signed: 12/14/2021 2:03:34 PM By: Melissa Shan DO Entered By: [name] on 12/14/2021 13:57:02 -------------------------------------------------------------------------------- Physician Orders Details Patient Name: Date of Service: HO LCO [name], [name] 12/14/2021 1:00 PM Medical Record Number: [id number] Patient Account Number: [id number] Date of Birth/Sex: Treating RN: 03-12-1929 (88 y.o. [name] Primary Care Provider: Ronnie [name] Other Clinician: Referring Provider: Treating Provider/Extender: [name] in Treatment: 1 Verbal / Phone Orders: No Diagnosis Coding ICD-10 Coding Code Description [phone] Non-pressure chronic ulcer of other part of left foot with fat layer exposed G90.09 Other idiopathic peripheral autonomic neuropathy T81.33XA Disruption of traumatic injury wound repair, initial encounter Follow-up Appointments ppointment in 1 week. - 12/21/2021 1045am w/  Dr. Heber [place] and Allayne Butcher, Room # 9 Return A Bathing/ Shower/ Hygiene May shower with protection but do not get wound dressing(s) wet. Edema Control - Lymphedema / SCD / Other Elevate legs to the level of the heart or above for 30 minutes daily and/or when sitting, a frequency of: Avoid standing for long periods of time. Off-Loading Open toe surgical [name] to: - LEFT FOOT Wound Treatment Wound #1 - T Great oe Wound Laterality: Plantar, Left Cleanser: Vashe 5.8 (oz) (Generic) 1 x Per Day/15 Days Discharge Instructions: Cleanse the wound with Vashe prior to applying a clean dressing using gauze sponges, not tissue or cotton balls. Prim Dressing: FIBRACOL Plus Dressing, 2x2 in (collagen) 1 x Per Day/15 Days ary Discharge Instructions: Moisten collagen with BLASTX Prim Dressing: BlastX, 0.25 (oz), tube ary 1 x Per Day/15 Days Secondary Dressing: Optifoam Non-Adhesive Dressing, 4x4 in (Generic) 1 x Per Day/15 Days Discharge Instructions: foam donut for offloading Secondary Dressing: Woven Gauze Sponge, Non-Sterile 4x4 in (Generic) 1 x Per Day/15 Days Discharge Instructions: Apply over primary dressing as directed. Secured With: Child psychotherapist, Sterile 2x75 (in/in) (Generic) 1 x Per Day/15 Days Discharge Instructions: Secure with stretch gauze as directed. Secured With: 31M Medipore H Soft Cloth Surgical T ape, 4 x 10 (in/yd) (Generic) 1 x Per Day/15 Days Discharge Instructions: Secure with tape as directed. Add-Ons: Cover-roll Stretch Fixation Dressing, 2x10 (in/yd) (Generic) 1 x Per Day/15 Days Electronic Signature(s) Signed: 12/14/2021 2:03:34 PM By: Melissa Shan DO Entered By: [name] on 12/14/2021 13:57:21 -------------------------------------------------------------------------------- Problem List Details Patient Name: Date of Service: HO LCO [name], [name] 12/14/2021 1:00 PM Medical Record Number: [id number] Patient Account Number: [id number] Date of  Birth/Sex: Treating RN: [birth year] (88 y.o. [name] Primary Care Provider: Ronnie [name] Other Clinician: Referring Provider: Treating Provider/Extender: [name] in Treatment: 1 Active Problems ICD-10 Encounter Code Description Active Date MDM Diagnosis [phone] Non-pressure chronic ulcer of other part of left foot with fat layer exposed 12/07/2021 No Yes G90.09 Other idiopathic peripheral autonomic neuropathy 12/07/2021 No Yes T81.33XA Disruption of traumatic injury wound repair, initial encounter 12/07/2021 No Yes Inactive Problems Resolved Problems Electronic Signature(s) Signed: 12/14/2021 2:03:34 PM By: Melissa Shan DO Entered By: [name] on 12/14/2021 13:53:13 -------------------------------------------------------------------------------- Progress Note Details Patient Name: Date of Service: HO LCO [name], [name] 12/14/2021 1:00 PM Medical Record Number: [id number] Patient Account Number: [id number] Date of Birth/Sex: Treating RN: 1929/01/23 (88 y.o. [name], [name] Primary Care Provider: Ronnie [name] Other Clinician: Referring Provider: Treating Provider/Extender: [name] in Treatment: 1 Subjective Chief Complaint  Information obtained from Patient 12/07/2021; left plantar toe wound History of Present Illness (HPI) Admission 12/07/2021 Ms. Maegen Wigle is a 88 year old female with a past medical history of chronic venous insufficiency, idiopathic peripheral neuropathy, paroxysmal A-fib on Coumadin that presents to the clinic for a 2 week history of nonhealing ulcer to the left plantar great toe. She had a callus to this area that she attempted to remove with scissors and subsequently developed a wound. She currently denies systemic signs of infection. She is keeping the area covered. She is currently on Keflex for left lower extremity cellulitis by her primary care office. 6/19;  patient presents for follow-up. She obtained her blast X and collagen and has been using this daily. She has no issues or complaints today. She denies signs of infection. Patient History Information obtained from Patient, Chart. Family History Unknown History. Social History Never smoker, Marital Status - Widowed, Alcohol Use - Never, Drug Use - No History, Caffeine Use - Rarely. Medical History Cardiovascular Patient has history of Arrhythmia - A Fibb, Hypertension Denies history of Angina, Congestive Heart Failure, Coronary Artery Disease, Deep Vein Thrombosis, Hypotension, Myocardial Infarction, Peripheral Arterial Disease, Peripheral Venous Disease, Phlebitis, Vasculitis Endocrine Denies history of Type I Diabetes, Type II Diabetes Neurologic Patient has history of Neuropathy Hospitalization/Surgery History - s/p mitral valve repair. - abd. hysterectomy. - cardiac cath.. - pacemaker placemen. Medical A Surgical History Notes nd Cardiovascular Pacemaker, post mitral valve repair, paroxysymal Afibb, sick sinus syndrome, hyperlipidemia, dysplipidemia Endocrine hyperthyroidism Objective Constitutional respirations regular, non-labored and within target range for patient.. Vitals Time Taken: 12:58 PM, Height: 68 in, Weight: 146 lbs, BMI: 22.2, Temperature: 97.8 F, Pulse: 76 bpm, Respiratory Rate: 20 breaths/min, Blood Pressure: 126/79 mmHg. Cardiovascular 2+ dorsalis pedis/posterior tibialis pulses. Psychiatric pleasant and cooperative. General Notes: Left lower extremity: T the plantar left great toe there is an open wound with undermining circumferentially. There is hyper granulated tissue o with nonviable tissue present. No surrounding signs of infection. Integumentary (Hair, Skin) Wound #1 status is Open. Original cause of wound was Trauma. The date acquired was: 11/26/2021. The wound has been in treatment 1 [name]. The wound is located on the [employer]. The wound  measures 0.7cm length x 0.8cm width x 0.3cm depth; 0.44cm^2 area and 0.132cm^3 volume. There is Fat oe Layer (Subcutaneous Tissue) exposed. There is no tunneling noted, however, there is undermining starting at 12:00 and ending at 12:00 with a maximum distance of 0.4cm. There is a medium amount of serosanguineous drainage noted. The wound margin is distinct with the outline attached to the wound base. There is large (67-100%) red, pink, hyper - granulation within the wound bed. There is a small (1-33%) amount of necrotic tissue within the wound bed including Adherent Slough. General Notes: Callous Periwound. Assessment Active Problems ICD-10 Non-pressure chronic ulcer of other part of left foot with fat layer exposed Other idiopathic peripheral autonomic neuropathy Disruption of traumatic injury wound repair, initial encounter Patient's wound is stable. I debrided nonviable tissue. I used silver nitrate to the hyper granulated areas. I recommended continuing collagen with blastx. Unfortunately she is a high fall risk. I cannot do the total contact cast or use a front offloading [name]. For now we will use a foam donut to help with offloading. Follow-up in 1 week. Procedures Wound #1 Pre-procedure diagnosis of Wound #1 is a Trauma, Other located on the [place] . There was a Excisional Skin/Subcutaneous Tissue Debridement oe with a total area of 1 sq  cm performed by Melissa Shan, DO. With the following instrument(s): Curette to remove Viable and Non-Viable tissue/material. Material removed includes Callus, Subcutaneous Tissue, Slough, Skin: Dermis, Skin: Epidermis, Biofilm, and Hyper-granulation after achieving pain control using Lidocaine 5% topical ointment. A time out was conducted at 13:15, prior to the start of the procedure. A Minimum amount of bleeding was controlled with Pressure. The procedure was tolerated well with a pain level of 0 throughout and a pain level of 0  following the procedure. Post Debridement Measurements: 0.7cm length x 0.8cm width x 0.3cm depth; 0.132cm^3 volume. Character of Wound/Ulcer Post Debridement is improved. Post procedure Diagnosis Wound #1: Same as Pre-Procedure Plan Follow-up Appointments: Return Appointment in 1 week. - 12/21/2021 1045am w/ Dr. Heber [place] and Allayne Butcher, Room # 9 Bathing/ Shower/ Hygiene: May shower with protection but do not get wound dressing(s) wet. Edema Control - Lymphedema / SCD / Other: Elevate legs to the level of the heart or above for 30 minutes daily and/or when sitting, a frequency of: Avoid standing for long periods of time. Off-Loading: Open toe surgical [name] to: - LEFT FOOT WOUND #1: - T Great Wound Laterality: Plantar, Left oe Cleanser: Vashe 5.8 (oz) (Generic) 1 x Per Day/15 Days Discharge Instructions: Cleanse the wound with Vashe prior to applying a clean dressing using gauze sponges, not tissue or cotton balls. Prim Dressing: FIBRACOL Plus Dressing, 2x2 in (collagen) 1 x Per Day/15 Days ary Discharge Instructions: Moisten collagen with BLASTX Prim Dressing: BlastX, 0.25 (oz), tube 1 x Per Day/15 Days ary Secondary Dressing: Optifoam Non-Adhesive Dressing, 4x4 in (Generic) 1 x Per Day/15 Days Discharge Instructions: foam donut for offloading Secondary Dressing: Woven Gauze Sponge, Non-Sterile 4x4 in (Generic) 1 x Per Day/15 Days Discharge Instructions: Apply over primary dressing as directed. Secured With: Child psychotherapist, Sterile 2x75 (in/in) (Generic) 1 x Per Day/15 Days Discharge Instructions: Secure with stretch gauze as directed. Secured With: 40M Medipore H Soft Cloth Surgical T ape, 4 x 10 (in/yd) (Generic) 1 x Per Day/15 Days Discharge Instructions: Secure with tape as directed. Add-Ons: Cover-roll Stretch Fixation Dressing, 2x10 (in/yd) (Generic) 1 x Per Day/15 Days 1. In office sharp debridement 2. Silver nitrate to the hyper granulated tissue 3. Foam  offloading donut 4. Follow-up in 1 week Electronic Signature(s) Signed: 12/14/2021 2:03:34 PM By: Melissa Shan DO Entered By: [name] on 12/14/2021 14:02:14 -------------------------------------------------------------------------------- HxROS Details Patient Name: Date of Service: HO LCO [name], [name] 12/14/2021 1:00 PM Medical Record Number: [id number] Patient Account Number: [id number] Date of Birth/Sex: Treating RN: 1928/12/03 (88 y.o. [name], [name] Primary Care Provider: Ronnie [name] Other Clinician: Referring Provider: Treating Provider/Extender: [name] in Treatment: 1 Information Obtained From Patient Chart Cardiovascular Medical History: Positive for: Arrhythmia - A Fibb; Hypertension Negative for: Angina; Congestive Heart Failure; Coronary Artery Disease; Deep Vein Thrombosis; Hypotension; Myocardial Infarction; Peripheral Arterial Disease; Peripheral Venous Disease; Phlebitis; Vasculitis Past Medical History Notes: Pacemaker, post mitral valve repair, paroxysymal Afibb, sick sinus syndrome, hyperlipidemia, dysplipidemia Endocrine Medical History: Negative for: Type I Diabetes; Type II Diabetes Past Medical History Notes: hyperthyroidism Neurologic Medical History: Positive for: Neuropathy Immunizations Pneumococcal Vaccine: Received Pneumococcal Vaccination: Yes Received Pneumococcal Vaccination On or After 60th Birthday: Yes Implantable Devices Yes Hospitalization / Surgery History Type of Hospitalization/Surgery s/p mitral valve repair abd. hysterectomy cardiac cath. pacemaker placemen Family and Social History Unknown History: Yes; Never smoker; Marital Status - Widowed; Alcohol Use: Never; Drug Use: No History; Caffeine Use: Rarely; Financial Concerns: No; Food,  Clothing or Shelter Needs: No; Support System Lacking: No; Transportation Concerns: No Electronic Signature(s) Signed: 12/14/2021 2:03:34 PM  By: Melissa Shan DO Signed: 12/16/2021 4:06:51 PM By: Rhae Hammock RN Entered By: [name] on 12/14/2021 13:54:04 -------------------------------------------------------------------------------- SuperBill Details Patient Name: Date of Service: HO LCO [name], Zakkiyya H. 12/14/2021 Medical Record Number: [id number] Patient Account Number: [id number] Date of Birth/Sex: Treating RN: [birth year] (88 y.o. [name] Primary Care Provider: Ronnie [name] Other Clinician: Referring Provider: Treating Provider/Extender: [name] in Treatment: 1 Diagnosis Coding ICD-10 Codes Code Description [phone] Non-pressure chronic ulcer of other part of left foot with fat layer exposed G90.09 Other idiopathic peripheral autonomic neuropathy T81.33XA Disruption of traumatic injury wound repair, initial encounter Facility Procedures CPT4 Code: [id number] Description: 99357 - DEB SUBQ TISSUE 20 SQ CM/< ICD-10 Diagnosis Description L97.522 Non-pressure chronic ulcer of other part of left foot with fat layer exposed Modifier: Quantity: 1 Physician Procedures : CPT4 Code Description Modifier [id number] - WC PHYS SUBQ TISS 20 SQ CM 1 ICD-10 Diagnosis Description L97.522 Non-pressure chronic ulcer of other part of left foot with fat layer exposed Quantity: Electronic Signature(s) Signed: 12/14/2021 2:03:34 PM By: Melissa Shan DO Entered By: [name] on 12/14/2021 14:02:25

## 2021-12-16 NOTE — Progress Notes (Signed)
Melissa, [name] ([id number]) Visit Report for 12/14/2021 Arrival Information Details Patient Name: Date of Service: [name], [name] 12/14/2021 1:00 PM Medical Record Number: [id number] Patient Account Number: [id number] Date of Birth/Sex: Treating RN: 1929/02/19 (88 y.o. [name], Tammi Klippel Primary Care [name]: [name] Other Clinician: Referring [name]: Treating [name]/Extender: [name] in Treatment: 1 Visit Information History Since Last Visit Added or deleted any medications: No Patient Arrived: Wheel Chair Any new allergies or adverse reactions: No Arrival Time: 12:56 Had a fall or experienced change in No Accompanied By: son activities of daily living that may affect Transfer Assistance: Manual risk of falls: Patient Identification Verified: Yes Signs or symptoms of abuse/neglect since last visito No Secondary Verification Process Completed: Yes Hospitalized since last visit: No Patient Requires Transmission-Based Precautions: No Implantable device outside of the clinic excluding No Patient Has Alerts: Yes cellular tissue based products placed in the center Patient Alerts: Patient on Blood Thinner since last visit: Has Dressing in Place as Prescribed: Yes Has Compression in Place as Prescribed: Yes Pain Present Now: No Electronic Signature(s) Signed: 12/14/2021 5:44:49 PM By: Melissa Pilling RN, BSN Entered By: [name] on 12/14/2021 12:57:09 -------------------------------------------------------------------------------- Encounter Discharge Information Details Patient Name: Date of Service: [name], [name] 12/14/2021 1:00 PM Medical Record Number: [id number] Patient Account Number: [id number] Date of Birth/Sex: Treating RN: 09/26/1928 (88 y.o. [name] Primary Care Letecia Arps: [name] Other Clinician: Referring Deniah Saia: Treating [name]/Extender: [name] in  Treatment: 1 Encounter Discharge Information Items Post Procedure Vitals Discharge Condition: Stable Temperature (F): 97.8 Ambulatory Status: Wheelchair Pulse (bpm): 76 Discharge Destination: Home Respiratory Rate (breaths/min): 20 Transportation: Private Auto Blood Pressure (mmHg): 126/79 Accompanied By: son Schedule Follow-up Appointment: Yes Clinical Summary of Care: Electronic Signature(s) Signed: 12/14/2021 5:44:49 PM By: Melissa Pilling RN, BSN Entered By: [name] on 12/14/2021 13:21:49 -------------------------------------------------------------------------------- Lower Extremity Assessment Details Patient Name: Date of Service: [name], [name] 12/14/2021 1:00 PM Medical Record Number: [id number] Patient Account Number: [id number] Date of Birth/Sex: Treating RN: 26-Feb-1929 (88 y.o. [name] Primary Care Donel Osowski: [name] Other Clinician: Referring Larhonda Dettloff: Treating Deshaun Schou/Extender: [name] in Treatment: 1 Edema Assessment Assessed: [Left: Yes] [Right: No] Edema: [Left: Ye] [Right: s] Calf Left: Right: Point of Measurement: 27 cm From Medial Instep 37 cm Ankle Left: Right: Point of Measurement: 9 cm From Medial Instep 31 cm Vascular Assessment Pulses: Dorsalis Pedis Palpable: [Left:Yes] Electronic Signature(s) Signed: 12/14/2021 5:44:49 PM By: Melissa Pilling RN, BSN Entered By: [name] on 12/14/2021 13:00:49 -------------------------------------------------------------------------------- Multi Wound Chart Details Patient Name: Date of Service: [name], [name] 12/14/2021 1:00 PM Medical Record Number: [id number] Patient Account Number: [id number] Date of Birth/Sex: Treating RN: 03/21/1929 (88 y.o. [name], [name] Primary Care Everest Hacking: [name] Other Clinician: Referring Ailis Rigaud: Treating Deshawna Mcneece/Extender: [name] in Treatment: 1 Vital  Signs Height(in): 68 Pulse(bpm): 76 Weight(lbs): 146 Blood Pressure(mmHg): 126/79 Body Mass Index(BMI): 22.2 Temperature(F): 97.8 Respiratory Rate(breaths/min): 20 Photos: [N/A:N/A] Left, Plantar T Great oe N/A N/A Wound Location: Trauma N/A N/A Wounding Event: Trauma, Other N/A N/A Primary Etiology: Arrhythmia, Hypertension, Neuropathy N/A N/A Comorbid History: 11/26/2021 N/A N/A Date Acquired: 1 N/A N/A [name] of Treatment: Open N/A N/A Wound Status: No N/A N/A Wound Recurrence: 0.7x0.8x0.3 N/A N/A Measurements L x W x D (cm) 0.44 N/A N/A A (cm) : rea 0.132 N/A N/A Volume (cm) : 43.40% N/A N/A % Reduction in A  rea: 15.40% N/A N/A % Reduction in Volume: 12 Starting Position 1 (o'clock): 12 Ending Position 1 (o'clock): 0.4 Maximum Distance 1 (cm): Yes N/A N/A Undermining: Full Thickness With Exposed Support N/A N/A Classification: Structures Medium N/A N/A Exudate A mount: Serosanguineous N/A N/A Exudate Type: red, brown N/A N/A Exudate Color: Distinct, outline attached N/A N/A Wound Margin: Large (67-100%) N/A N/A Granulation A mount: Red, Pink, Hyper-granulation N/A N/A Granulation Quality: Small (1-33%) N/A N/A Necrotic A mount: Fat Layer (Subcutaneous Tissue): Yes N/A N/A Exposed Structures: Fascia: No Tendon: No Muscle: No Joint: No Bone: No None N/A N/A Epithelialization: Debridement - Excisional N/A N/A Debridement: Pre-procedure Verification/Time Out 13:15 N/A N/A Taken: Lidocaine 5% topical ointment N/A N/A Pain Control: Callus, Subcutaneous, Slough N/A N/A Tissue Debrided: Skin/Subcutaneous Tissue N/A N/A Level: 1 N/A N/A Debridement A (sq cm): rea Curette N/A N/A Instrument: Minimum N/A N/A Bleeding: Pressure N/A N/A Hemostasis A chieved: 0 N/A N/A Procedural Pain: 0 N/A N/A Post Procedural Pain: Procedure was tolerated well N/A N/A Debridement Treatment Response: 0.7x0.8x0.3 N/A N/A Post Debridement  Measurements L x W x D (cm) 0.132 N/A N/A Post Debridement Volume: (cm) Callous Periwound. N/A N/A Assessment Notes: Debridement N/A N/A Procedures Performed: Treatment Notes Wound #1 (Toe Great) Wound Laterality: Plantar, Left Cleanser Vashe 5.8 (oz) Discharge Instruction: Cleanse the wound with Vashe prior to applying a clean dressing using gauze sponges, not tissue or cotton balls. Peri-Wound Care Topical Primary Dressing FIBRACOL Plus Dressing, 2x2 in (collagen) Discharge Instruction: Moisten collagen with BLASTX BlastX, 0.25 (oz), tube Secondary Dressing Optifoam Non-Adhesive Dressing, 4x4 in Discharge Instruction: foam donut for offloading Woven Gauze Sponge, Non-Sterile 4x4 in Discharge Instruction: Apply over primary dressing as directed. Secured With Conforming Stretch Gauze Bandage, Sterile 2x75 (in/in) Discharge Instruction: Secure with stretch gauze as directed. 12M Medipore H Soft Cloth Surgical T ape, 4 x 10 (in/yd) Discharge Instruction: Secure with tape as directed. Compression Wrap Compression Stockings Add-Ons Cover-roll Stretch Fixation Dressing, 2x10 (in/yd) Electronic Signature(s) Signed: 12/14/2021 2:03:34 PM By: Kalman Shan DO Signed: 12/16/2021 4:06:51 PM By: Rhae Hammock RN Entered By: Kalman Shan on 12/14/2021 13:53:18 -------------------------------------------------------------------------------- Multi-Disciplinary Care Plan Details Patient Name: Date of Service: [name], [name] 12/14/2021 1:00 PM Medical Record Number: [id number] Patient Account Number: [id number] Date of Birth/Sex: Treating RN: 27-Aug-1928 (88 y.o. [name], Tammi Klippel Primary Care Zanna Hawn: [name] Other Clinician: Referring Jefferie Holston: Treating Lief Palmatier/Extender: [name] in Treatment: 1 Active Inactive Wound/Skin Impairment Nursing Diagnoses: Impaired tissue integrity Knowledge deficit related to  ulceration/compromised skin integrity Goals: Patient will have a decrease in wound volume by X% from date: (specify in notes) Date Initiated: 12/07/2021 Target Resolution Date: 01/01/2022 Goal Status: Active Patient/caregiver will verbalize understanding of skin care regimen Date Initiated: 12/07/2021 Target Resolution Date: 01/01/2022 Goal Status: Active Ulcer/skin breakdown will have a volume reduction of 30% by week 4 Date Initiated: 12/07/2021 Target Resolution Date: 01/01/2022 Goal Status: Active Interventions: Assess patient/caregiver ability to obtain necessary supplies Assess patient/caregiver ability to perform ulcer/skin care regimen upon admission and as needed Assess ulceration(s) every visit Notes: Electronic Signature(s) Signed: 12/14/2021 5:44:49 PM By: Melissa Pilling RN, BSN Entered By: [name] on 12/14/2021 13:16:36 -------------------------------------------------------------------------------- Pain Assessment Details Patient Name: Date of Service: [name], [name] 12/14/2021 1:00 PM Medical Record Number: [id number] Patient Account Number: [id number] Date of Birth/Sex: Treating RN: [birth year] (88 y.o. [name] Primary Care Laritza Vokes: [name] Other Clinician: Referring Mariaguadalupe Fialkowski: Treating Madalin Hughart/Extender: Deberah Castle, Leatrice Jewels Suella Grove  in Treatment: 1 Active Problems Location of Pain Severity and Description of Pain Patient Has Paino No Site Locations Rate the pain. Current Pain Level: 0 Pain Management and Medication Current Pain Management: Medication: No Cold Application: No Rest: No Massage: No Activity: No T.E.N.S.: No Heat Application: No Leg drop or elevation: No Is the Current Pain Management Adequate: Adequate How does your wound impact your activities of daily livingo Sleep: No Bathing: No Appetite: No Relationship With Others: No Bladder Continence: No Emotions: No Bowel Continence: No Work:  No Toileting: No Drive: No Dressing: No Hobbies: No Engineer, maintenance) Signed: 12/14/2021 5:44:49 PM By: Melissa Pilling RN, BSN Entered By: [name] on 12/14/2021 12:59:14 -------------------------------------------------------------------------------- Patient/Caregiver Education Details Patient Name: Date of Service: [name], [name] 6/19/2023andnbsp1:00 PM Medical Record Number: [id number] Patient Account Number: [id number] Date of Birth/Gender: Treating RN: 1928-07-10 (87 y.o. [name] Primary Care Physician: [name] Other Clinician: Referring Physician: Treating Physician/Extender: Carrie Mew in Treatment: 1 Education Assessment Education Provided To: Patient Education Topics Provided Wound/Skin Impairment: Handouts: Skin Care Do's and Dont's Methods: Explain/Verbal Responses: Reinforcements needed Electronic Signature(s) Signed: 12/14/2021 5:44:49 PM By: Melissa Pilling RN, BSN Entered By: [name] on 12/14/2021 13:16:47 -------------------------------------------------------------------------------- Wound Assessment Details Patient Name: Date of Service: [name], [name] 12/14/2021 1:00 PM Medical Record Number: [id number] Patient Account Number: [id number] Date of Birth/Sex: Treating RN: 1929/06/13 (88 y.o. [name], Tammi Klippel Primary Care Laquiesha Piacente: [name] Other Clinician: Referring Sharry Beining: Treating Raidon Swanner/Extender: [name] in Treatment: 1 Wound Status Wound Number: 1 Primary Etiology: Trauma, Other Wound Location: Left, Plantar T Great oe Wound Status: Open Wounding Event: Trauma Comorbid History: Arrhythmia, Hypertension, Neuropathy Date Acquired: 11/26/2021 [name] Of Treatment: 1 Clustered Wound: No Photos Wound Measurements Length: (cm) 0.7 Width: (cm) 0.8 Depth: (cm) 0.3 Area: (cm) 0.44 Volume: (cm) 0.132 % Reduction in Area: 43.4% %  Reduction in Volume: 15.4% Epithelialization: None Tunneling: No Undermining: Yes Starting Position (o'clock): 12 Ending Position (o'clock): 12 Maximum Distance: (cm) 0.4 Wound Description Classification: Full Thickness With Exposed Support Structures Wound Margin: Distinct, outline attached Exudate Amount: Medium Exudate Type: Serosanguineous Exudate Color: red, brown Foul Odor After Cleansing: No Slough/Fibrino Yes Wound Bed Granulation Amount: Large (67-100%) Exposed Structure Granulation Quality: Red, Pink, Hyper-granulation Fascia Exposed: No Necrotic Amount: Small (1-33%) Fat Layer (Subcutaneous Tissue) Exposed: Yes Necrotic Quality: Adherent Slough Tendon Exposed: No Muscle Exposed: No Joint Exposed: No Bone Exposed: No Assessment Notes Callous Periwound. Treatment Notes Wound #1 (Toe Great) Wound Laterality: Plantar, Left Cleanser Vashe 5.8 (oz) Discharge Instruction: Cleanse the wound with Vashe prior to applying a clean dressing using gauze sponges, not tissue or cotton balls. Peri-Wound Care Topical Primary Dressing FIBRACOL Plus Dressing, 2x2 in (collagen) Discharge Instruction: Moisten collagen with BLASTX BlastX, 0.25 (oz), tube Secondary Dressing Optifoam Non-Adhesive Dressing, 4x4 in Discharge Instruction: foam donut for offloading Woven Gauze Sponge, Non-Sterile 4x4 in Discharge Instruction: Apply over primary dressing as directed. Secured With Conforming Stretch Gauze Bandage, Sterile 2x75 (in/in) Discharge Instruction: Secure with stretch gauze as directed. 76M Medipore H Soft Cloth Surgical T ape, 4 x 10 (in/yd) Discharge Instruction: Secure with tape as directed. Compression Wrap Compression Stockings Add-Ons Cover-roll Stretch Fixation Dressing, 2x10 (in/yd) Electronic Signature(s) Signed: 12/14/2021 5:44:49 PM By: Melissa Pilling RN, BSN Entered By: [name] on 12/14/2021  13:04:19 -------------------------------------------------------------------------------- Vitals Details Patient Name: Date of Service: [name], [name] 12/14/2021 1:00 PM Medical Record Number: [id number] Patient Account Number:  [id number] Date of Birth/Sex: Treating RN: January 31, 1929 (88 y.o. [name], Tammi Klippel Primary Care Seona Clemenson: [name] Other Clinician: Referring Darlyne Schmiesing: Treating Wilver Tignor/Extender: [name] in Treatment: 1 Vital Signs Time Taken: 12:58 Temperature (F): 97.8 Height (in): 68 Pulse (bpm): 76 Weight (lbs): 146 Respiratory Rate (breaths/min): 20 Body Mass Index (BMI): 22.2 Blood Pressure (mmHg): 126/79 Reference Range: 80 - 120 mg / dl Electronic Signature(s) Signed: 12/14/2021 5:44:49 PM By: Melissa Pilling RN, BSN Entered By: [name] on 12/14/2021 12:59:06

## 2021-12-21 ENCOUNTER — Encounter (HOSPITAL_BASED_OUTPATIENT_CLINIC_OR_DEPARTMENT_OTHER): Payer: MEDICARE | Admitting: Internal Medicine

## 2021-12-21 DIAGNOSIS — G9009 Other idiopathic peripheral autonomic neuropathy: Secondary | ICD-10-CM | POA: Diagnosis not present

## 2021-12-21 DIAGNOSIS — I480 Paroxysmal atrial fibrillation: Secondary | ICD-10-CM | POA: Diagnosis not present

## 2021-12-21 DIAGNOSIS — Z7901 Long term (current) use of anticoagulants: Secondary | ICD-10-CM | POA: Diagnosis not present

## 2021-12-21 DIAGNOSIS — L97522 Non-pressure chronic ulcer of other part of left foot with fat layer exposed: Secondary | ICD-10-CM | POA: Diagnosis not present

## 2021-12-21 DIAGNOSIS — G629 Polyneuropathy, unspecified: Secondary | ICD-10-CM | POA: Diagnosis not present

## 2021-12-21 DIAGNOSIS — L080 Pyoderma: Secondary | ICD-10-CM | POA: Diagnosis not present

## 2021-12-21 DIAGNOSIS — I872 Venous insufficiency (chronic) (peripheral): Secondary | ICD-10-CM | POA: Diagnosis not present

## 2021-12-21 DIAGNOSIS — T8133XA Disruption of traumatic injury wound repair, initial encounter: Secondary | ICD-10-CM | POA: Diagnosis not present

## 2021-12-23 ENCOUNTER — Encounter: Payer: Self-pay | Admitting: Family Medicine

## 2021-12-23 ENCOUNTER — Ambulatory Visit (INDEPENDENT_AMBULATORY_CARE_PROVIDER_SITE_OTHER): Payer: MEDICARE | Admitting: Family Medicine

## 2021-12-23 VITALS — BP 109/57 | HR 77 | Temp 97.7°F | Resp 20 | Ht 66.0 in | Wt 149.0 lb

## 2021-12-23 DIAGNOSIS — S91102A Unspecified open wound of left great toe without damage to nail, initial encounter: Secondary | ICD-10-CM

## 2021-12-23 DIAGNOSIS — L03116 Cellulitis of left lower limb: Secondary | ICD-10-CM | POA: Diagnosis not present

## 2021-12-23 DIAGNOSIS — Z7901 Long term (current) use of anticoagulants: Secondary | ICD-10-CM

## 2021-12-23 LAB — COAGUCHEK XS/INR WAIVED
INR: 2.8 — ABNORMAL HIGH (ref 0.9–1.1)
Prothrombin Time: 33 s

## 2021-12-23 NOTE — Progress Notes (Signed)
Subjective: CC: INR recheck PCP: Melissa Norlander, DO QTM:AUQJF [name] is a 88 y.o. female presenting to clinic today for:  1. INR recheck Seen about 10 days ago for f/u cellulitis but apparently pt noted at that visit that she had another wound on her left great toe that was self inflicted after she tried to remove a callous with scissors.  She was referred to wound care and encouraged to proceed with prescribed antibiotics.  She notes that she is doing well.  She has not had any abnormal bleeding including bleeding from the left great toe where the wound was sustained.  She continues to follow-up with wound care in [place] who notes that she is somewhat tired of going to see them because she does not feel like the wound is that bad.  Her sons are bringing her back and forth.   ROS: Per HPI  Allergies  Allergen Reactions   Statins Other (See Comments)    Myalgia   Past Medical History:  Diagnosis Date   A-fib ([place])    Dyslipidemia    Hyperlipidemia    Hypothyroidism    2 degree amidarone    Pacemaker    PAF (paroxysmal atrial fibrillation) (H[facility])    S/P mitral valve repair    SSS (sick sinus syndrome) (H[facility])    Systemic hypertension     Current Outpatient Medications:    furosemide (LASIX) 20 MG tablet, Take 1 tablet (20 mg total) by mouth daily for 3 days., Disp: 3 tablet, Rfl: 0   levothyroxine (SYNTHROID) 25 MCG tablet, TAKE 1 TABLET DAILY, Disp: 90 tablet, Rfl: 3   metoprolol tartrate (LOPRESSOR) 50 MG tablet, TAKE 1 TABLET TWICE A DAY, Disp: 180 tablet, Rfl: 3   Polyethyl Glycol-Propyl Glycol (SYSTANE OP), Apply 1 drop to eye daily as needed (dry eyes)., Disp: , Rfl:    triamterene-hydrochlorothiazide (DYAZIDE) 37.5-25 MG capsule, TAKE 1 CAPSULE DAILY, Disp: 90 capsule, Rfl: 3   warfarin (COUMADIN) 2.5 MG tablet, 1 tablet by mouth daily except 2 tablets on Fridays., Disp: 90 tablet, Rfl: 3 Social History   Socioeconomic History   Marital status: Widowed     Spouse name: Not on file   Number of children: 3   Years of education: Not on file   Highest education level: High school graduate  Occupational History   Occupation: Retired  Tobacco Use   Smoking status: Never   Smokeless tobacco: Never  Vaping Use   Vaping Use: Never used  Substance and Sexual Activity   Alcohol use: No   Drug use: Never   Sexual activity: Not Currently  Other Topics Concern   Not on file  Social History Narrative   Husband passed 06/29/2021   2 sons live with her   Social Determinants of Health   Financial Resource Strain: Low Risk  (09/14/2021)   Overall Financial Resource Strain (CARDIA)    Difficulty of Paying Living Expenses: Not hard at all  Food Insecurity: No Food Insecurity (09/14/2021)   Hunger Vital Sign    Worried About Running Out of Food in the Last Year: Never true    [place] in the Last Year: Never true  Transportation Needs: No Transportation Needs (09/14/2021)   PRAPARE - Hydrologist (Medical): No    Lack of Transportation (Non-Medical): No  Physical Activity: Insufficiently Active (09/14/2021)   Exercise Vital Sign    Days of Exercise per Week: 7 days  Minutes of Exercise per Session: 10 min  Stress: No Stress Concern Present (09/14/2021)   Bath    Feeling of Stress : Only a little  Social Connections: Socially Isolated (09/14/2021)   Social Connection and Isolation Panel [NHANES]    Frequency of Communication with Friends and Family: More than three times a week    Frequency of Social Gatherings with Friends and Family: More than three times a week    Attends Religious Services: Never    Marine scientist or Organizations: No    Attends Archivist Meetings: Never    Marital Status: Widowed  Intimate Partner Violence: Not At Risk (09/14/2021)   Humiliation, Afraid, Rape, and Kick questionnaire    Fear of Current  or Ex-Partner: No    Emotionally Abused: No    Physically Abused: No    Sexually Abused: No   Family History  Problem Relation Age of Onset   Heart attack Mother    Heart attack Father    Parkinson's disease Son     Objective: Office vital signs reviewed. BP (!) 109/57   Pulse 77   Temp 97.7 F (36.5 C)   Resp 20   Ht '5\' 6"'$  (1.676 m)   Wt 149 lb (67.6 kg)   SpO2 100%   BMI 24.05 kg/m   Physical Examination:  General: Awake, alert, nontoxic elderly female, No acute distress Cardio: regular rate and rhythm, S1S2 heard, no murmurs appreciated Pulm: clear to auscultation bilaterally, no wheezes, rhonchi or rales; normal work of breathing on room air MSK: Valgus deformity of lower extremities.  Ambulating very slowly and requires cane for stabilization. Skin: Left great toe with what looks to be healing well-circumscribed laceration.  No exudate, warmth, erythema or bleeding  Assessment/ Plan: 88 y.o. female   Anticoagulation monitoring, INR range 2-3 - Plan: CoaguChek XS/INR Waived  Cellulitis of left lower extremity - Plan: CoaguChek XS/INR Waived  Open wound of left great toe, initial encounter  INR therapeutic at 2.8.  No changes.  6-week follow-up has been scheduled for the patient  Cellulitis seems to be resolved.  Wound without evidence of secondary infection.    No orders of the defined types were placed in this encounter.  No orders of the defined types were placed in this encounter.    Melissa Norlander, DO [place] [phone]

## 2022-01-04 ENCOUNTER — Ambulatory Visit (HOSPITAL_COMMUNITY)
Admission: RE | Admit: 2022-01-04 | Discharge: 2022-01-04 | Disposition: A | Discharge status: Home / Self Care | Payer: MEDICARE | Source: Ambulatory Visit | Attending: Internal Medicine | Admitting: Internal Medicine

## 2022-01-04 ENCOUNTER — Encounter (HOSPITAL_BASED_OUTPATIENT_CLINIC_OR_DEPARTMENT_OTHER): Payer: MEDICARE | Attending: Internal Medicine | Admitting: Internal Medicine

## 2022-01-04 ENCOUNTER — Other Ambulatory Visit (HOSPITAL_COMMUNITY): Payer: Self-pay | Admitting: Internal Medicine

## 2022-01-04 DIAGNOSIS — Z950 Presence of cardiac pacemaker: Secondary | ICD-10-CM | POA: Diagnosis not present

## 2022-01-04 DIAGNOSIS — L97522 Non-pressure chronic ulcer of other part of left foot with fat layer exposed: Secondary | ICD-10-CM

## 2022-01-04 DIAGNOSIS — Z7901 Long term (current) use of anticoagulants: Secondary | ICD-10-CM | POA: Diagnosis not present

## 2022-01-04 DIAGNOSIS — G9009 Other idiopathic peripheral autonomic neuropathy: Secondary | ICD-10-CM | POA: Diagnosis not present

## 2022-01-04 DIAGNOSIS — I10 Essential (primary) hypertension: Secondary | ICD-10-CM | POA: Diagnosis not present

## 2022-01-04 DIAGNOSIS — X58XXXA Exposure to other specified factors, initial encounter: Secondary | ICD-10-CM | POA: Insufficient documentation

## 2022-01-04 DIAGNOSIS — M7989 Other specified soft tissue disorders: Secondary | ICD-10-CM | POA: Diagnosis not present

## 2022-01-04 DIAGNOSIS — I480 Paroxysmal atrial fibrillation: Secondary | ICD-10-CM | POA: Insufficient documentation

## 2022-01-04 DIAGNOSIS — T8133XA Disruption of traumatic injury wound repair, initial encounter: Secondary | ICD-10-CM | POA: Insufficient documentation

## 2022-01-04 NOTE — Progress Notes (Signed)
ANNEL, ZUNKER ([id number]) Visit Report for 01/04/2022 Chief Complaint Document Details Patient Name: Date of Service: HO LCO [name], [name] 01/04/2022 10:45 A M Medical Record Number: [id number] Patient Account Number: [id number] Date of Birth/Sex: Treating RN: 10/12/1928 (88 y.o. [name], [name] Primary Care Provider: Ronnie [name] Other Clinician: Referring Provider: Treating Provider/Extender: [name] in Treatment: 4 Information Obtained from: Patient Chief Complaint 12/07/2021; left plantar toe wound Electronic Signature(s) Signed: 01/04/2022 1:46:13 PM By: Kalman Shan DO Entered By: Kalman Shan on 01/04/2022 12:39:05 -------------------------------------------------------------------------------- HPI Details Patient Name: Date of Service: HO LCO [name], [name] 01/04/2022 10:45 A M Medical Record Number: [id number] Patient Account Number: [id number] Date of Birth/Sex: Treating RN: 26-Jul-1928 (88 y.o. [name] Primary Care Provider: Ronnie [name] Other Clinician: Referring Provider: Treating Provider/Extender: [name] in Treatment: 4 History of Present Illness HPI Description: Admission 12/07/2021 [name] is a 88 year old female with a past medical history of chronic venous insufficiency, idiopathic peripheral neuropathy, paroxysmal A-fib on Coumadin that presents to the clinic for a 2 week history of nonhealing ulcer to the left plantar great toe. She had a callus to this area that she attempted to remove with scissors and subsequently developed a wound. She currently denies systemic signs of infection. She is keeping the area covered. She is currently on Keflex for left lower extremity cellulitis by her primary care office. 6/19; patient presents for follow-up. She obtained her blast X and collagen and has been using this daily. She has no issues or complaints today. She  denies signs of infection. 6/26; patient presents for follow-up. She has been using blast X and collagen without issues. She has no complaints today. She denies signs of infection. She has been using the offloading felt donut pad. 7/10; patient presents for follow-up. A PCR culture was done at last clinic visit that showed high levels of Enterococcus bacillus and Proteus mirabilis. I had recommended Keystone antibiotics however patient declines this. She has been using blast X and collagen without issues. She has no issues or complaints today. Electronic Signature(s) Signed: 01/04/2022 1:46:13 PM By: Kalman Shan DO Entered By: Kalman Shan on 01/04/2022 12:40:00 -------------------------------------------------------------------------------- Physical Exam Details Patient Name: Date of Service: HO LCO [name], [name] 01/04/2022 10:45 A M Medical Record Number: [id number] Patient Account Number: [id number] Date of Birth/Sex: Treating RN: 03/29/1929 (88 y.o. [name] Primary Care Provider: Ronnie [name] Other Clinician: Referring Provider: Treating Provider/Extender: [name] in Treatment: 4 Constitutional respirations regular, non-labored and within target range for patient.. Cardiovascular 2+ dorsalis pedis/posterior tibialis pulses. Psychiatric pleasant and cooperative. Notes Left lower extremity: T the plantar left great toe there is an open wound with Granulation tissue and Increased depth to the more proximal aspect. No o surrounding signs of infection. Electronic Signature(s) Signed: 01/04/2022 1:46:13 PM By: Kalman Shan DO Entered By: Kalman Shan on 01/04/2022 12:40:31 -------------------------------------------------------------------------------- Physician Orders Details Patient Name: Date of Service: HO LCO [name], [name] 01/04/2022 10:45 A M Medical Record Number: [id number] Patient Account Number: [id number] Date  of Birth/Sex: Treating RN: 1928/07/30 (88 y.o. [name] Primary Care Provider: Ronnie [name] Other Clinician: Referring Provider: Treating Provider/Extender: [name] in Treatment: 4 Verbal / Phone Orders: No Diagnosis Coding Follow-up Appointments ppointment in 2 [name]. - 01/18/22 @ 1000 w/ Dr. Heber [name] and Alcide Clever covering), Room # 9 Return A Bathing/ Shower/ Hygiene May shower with protection but do not get wound dressing(s)  wet. Edema Control - Lymphedema / SCD / Other Elevate legs to the level of the heart or above for 30 minutes daily and/or when sitting, a frequency of: Avoid standing for long periods of time. Off-Loading Open toe surgical shoe to: - LEFT FOOT Wound Treatment Wound #1 - T Great oe Wound Laterality: Plantar, Left Cleanser: Vashe 5.8 (oz) (Generic) 1 x Per Day/15 Days Discharge Instructions: Cleanse the wound with Vashe prior to applying a clean dressing using gauze sponges, not tissue or cotton balls. Prim Dressing: FIBRACOL Plus Dressing, 2x2 in (collagen) 1 x Per Day/15 Days ary Discharge Instructions: Moisten collagen with BLASTX Prim Dressing: BlastX, 0.25 (oz), tube ary 1 x Per Day/15 Days Secondary Dressing: Optifoam Non-Adhesive Dressing, 4x4 in (Generic) 1 x Per Day/15 Days Discharge Instructions: foam donut for offloading Secondary Dressing: Woven Gauze Sponge, Non-Sterile 4x4 in (Generic) 1 x Per Day/15 Days Discharge Instructions: Apply over primary dressing as directed. Secured With: Child psychotherapist, Sterile 2x75 (in/in) (Generic) 1 x Per Day/15 Days Discharge Instructions: Secure with stretch gauze as directed. Secured With: 34M Medipore H Soft Cloth Surgical T ape, 4 x 10 (in/yd) (Generic) 1 x Per Day/15 Days Discharge Instructions: Secure with tape as directed. Add-Ons: Cover-roll Stretch Fixation Dressing, 2x10 (in/yd) (Generic) 1 x Per Day/15 Days Patient  Medications llergies: Statins-HMG-CoA Reductase Inhibitors, penicillin A Notifications Medication Indication Start End 01/04/2022 levofloxacin DOSE 1 - oral 750 mg tablet - 1 tablet oral daily x 5 days Electronic Signature(s) Signed: 01/04/2022 1:46:13 PM By: Kalman Shan DO Previous Signature: 01/04/2022 11:37:25 AM Version By: Kalman Shan DO Entered By: Kalman Shan on 01/04/2022 12:40:41 -------------------------------------------------------------------------------- Problem List Details Patient Name: Date of Service: HO LCO [name], Kemaya H. 01/04/2022 10:45 A M Medical Record Number: [id number] Patient Account Number: [id number] Date of Birth/Sex: Treating RN: 04-04-1929 (88 y.o. [name], [name] Primary Care Provider: Ronnie [name] Other Clinician: Referring Provider: Treating Provider/Extender: [name] in Treatment: 4 Active Problems ICD-10 Encounter Code Description Active Date MDM Diagnosis [phone] Non-pressure chronic ulcer of other part of left foot with fat layer exposed 12/07/2021 No Yes G90.09 Other idiopathic peripheral autonomic neuropathy 12/07/2021 No Yes T81.33XA Disruption of traumatic injury wound repair, initial encounter 12/07/2021 No Yes Inactive Problems Resolved Problems Electronic Signature(s) Signed: 01/04/2022 1:46:13 PM By: Kalman Shan DO Entered By: Kalman Shan on 01/04/2022 12:38:54 -------------------------------------------------------------------------------- Progress Note Details Patient Name: Date of Service: HO LCO [name], [name] 01/04/2022 10:45 A M Medical Record Number: [id number] Patient Account Number: [id number] Date of Birth/Sex: Treating RN: 1929-01-12 (88 y.o. [name] Primary Care Provider: Ronnie [name] Other Clinician: Referring Provider: Treating Provider/Extender: [name] in Treatment: 4 Subjective Chief  Complaint Information obtained from Patient 12/07/2021; left plantar toe wound History of Present Illness (HPI) Admission 12/07/2021 Ms. [name] is a 88 year old female with a past medical history of chronic venous insufficiency, idiopathic peripheral neuropathy, paroxysmal A-fib on Coumadin that presents to the clinic for a 2 week history of nonhealing ulcer to the left plantar great toe. She had a callus to this area that she attempted to remove with scissors and subsequently developed a wound. She currently denies systemic signs of infection. She is keeping the area covered. She is currently on Keflex for left lower extremity cellulitis by her primary care office. 6/19; patient presents for follow-up. She obtained her blast X and collagen and has been using this daily. She has no issues or complaints today. She denies signs  of infection. 6/26; patient presents for follow-up. She has been using blast X and collagen without issues. She has no complaints today. She denies signs of infection. She has been using the offloading felt donut pad. 7/10; patient presents for follow-up. A PCR culture was done at last clinic visit that showed high levels of Enterococcus bacillus and Proteus mirabilis. I had recommended Keystone antibiotics however patient declines this. She has been using blast X and collagen without issues. She has no issues or complaints today. Patient History Information obtained from Patient, Chart. Allergies Statins-HMG-CoA Reductase Inhibitors, penicillin Family History Unknown History. Social History Never smoker, Marital Status - Widowed, Alcohol Use - Never, Drug Use - No History, Caffeine Use - Rarely. Medical History Cardiovascular Patient has history of Arrhythmia - A Fibb, Hypertension Denies history of Angina, Congestive Heart Failure, Coronary Artery Disease, Deep Vein Thrombosis, Hypotension, Myocardial Infarction, Peripheral Arterial Disease, Peripheral Venous  Disease, Phlebitis, Vasculitis Endocrine Denies history of Type I Diabetes, Type II Diabetes Neurologic Patient has history of Neuropathy Hospitalization/Surgery History - s/p mitral valve repair. - abd. hysterectomy. - cardiac cath.. - pacemaker placemen. Medical A Surgical History Notes nd Cardiovascular Pacemaker, post mitral valve repair, paroxysymal Afibb, sick sinus syndrome, hyperlipidemia, dysplipidemia Endocrine hyperthyroidism Objective Constitutional respirations regular, non-labored and within target range for patient.. Vitals Time Taken: 10:47 AM, Height: 68 in, Weight: 146 lbs, BMI: 22.2, Temperature: 98 F, Pulse: 79 bpm, Respiratory Rate: 17 breaths/min, Blood Pressure: 138/76 mmHg. Cardiovascular 2+ dorsalis pedis/posterior tibialis pulses. Psychiatric pleasant and cooperative. General Notes: Left lower extremity: T the plantar left great toe there is an open wound with Granulation tissue and Increased depth to the more proximal o aspect. No surrounding signs of infection. Integumentary (Hair, Skin) Wound #1 status is Open. Original cause of wound was Trauma. The date acquired was: 11/26/2021. The wound has been in treatment 4 [name]. The wound is located on the [employer]. The wound measures 1cm length x 1cm width x 0.5cm depth; 0.785cm^2 area and 0.393cm^3 volume. There is bone and oe Fat Layer (Subcutaneous Tissue) exposed. There is no tunneling noted, however, there is undermining starting at 10:00 and ending at 2:00 with a maximum distance of 0.3cm. There is a medium amount of serosanguineous drainage noted. The wound margin is distinct with the outline attached to the wound base. There is large (67-100%) red, pink, hyper - granulation within the wound bed. There is a small (1-33%) amount of necrotic tissue within the wound bed including Adherent Slough. Assessment Active Problems ICD-10 Non-pressure chronic ulcer of other part of left foot with fat  layer exposed Other idiopathic peripheral autonomic neuropathy Disruption of traumatic injury wound repair, initial encounter Patient's wound has increased depth to the more proximal area. Due to this I recommended either doing [facility] antibiotic or oral antibiotics based on last clinics wound culture resutls. Patient opted for oral. She states she had an anaphylactic reaction to penicillins. We will treat with levofloxacin as this was sensitive based on culture results. She is on warfarin I asked that she Check her INR this week with her primary care physician's office. I also recommended an x-ray to assure there is no bony destruction. Plan Follow-up Appointments: Return Appointment in 2 [name]. - 01/18/22 @ 1000 w/ Dr. Heber Crenshaw and Alcide Clever covering), Room # 9 Bathing/ Shower/ Hygiene: May shower with protection but do not get wound dressing(s) wet. Edema Control - Lymphedema / SCD / Other: Elevate legs to the level of the heart or above  for 30 minutes daily and/or when sitting, a frequency of: Avoid standing for long periods of time. Off-Loading: Open toe surgical shoe to: - LEFT FOOT The following medication(s) was prescribed: levofloxacin oral 750 mg tablet 1 1 tablet oral daily x 5 days starting 01/04/2022 WOUND #1: - T Great Wound Laterality: Plantar, Left oe Cleanser: Vashe 5.8 (oz) (Generic) 1 x Per Day/15 Days Discharge Instructions: Cleanse the wound with Vashe prior to applying a clean dressing using gauze sponges, not tissue or cotton balls. Prim Dressing: FIBRACOL Plus Dressing, 2x2 in (collagen) 1 x Per Day/15 Days ary Discharge Instructions: Moisten collagen with BLASTX Prim Dressing: BlastX, 0.25 (oz), tube 1 x Per Day/15 Days ary Secondary Dressing: Optifoam Non-Adhesive Dressing, 4x4 in (Generic) 1 x Per Day/15 Days Discharge Instructions: foam donut for offloading Secondary Dressing: Woven Gauze Sponge, Non-Sterile 4x4 in (Generic) 1 x Per Day/15 Days Discharge  Instructions: Apply over primary dressing as directed. Secured With: Child psychotherapist, Sterile 2x75 (in/in) (Generic) 1 x Per Day/15 Days Discharge Instructions: Secure with stretch gauze as directed. Secured With: 29M Medipore H Soft Cloth Surgical T ape, 4 x 10 (in/yd) (Generic) 1 x Per Day/15 Days Discharge Instructions: Secure with tape as directed. Add-Ons: Cover-roll Stretch Fixation Dressing, 2x10 (in/yd) (Generic) 1 x Per Day/15 Days 1. Levofloxacin 2. Left foot x-ray 3. Follow-up in 1 week 4. Continue blast X and collagen Electronic Signature(s) Signed: 01/04/2022 1:46:13 PM By: Kalman Shan DO Entered By: Kalman Shan on 01/04/2022 12:47:35 -------------------------------------------------------------------------------- HxROS Details Patient Name: Date of Service: HO LCO [name], [name] 01/04/2022 10:45 A M Medical Record Number: [id number] Patient Account Number: [id number] Date of Birth/Sex: Treating RN: 1929-03-23 (88 y.o. [name], [name] Primary Care Provider: Ronnie [name] Other Clinician: Referring Provider: Treating Provider/Extender: [name] in Treatment: 4 Information Obtained From Patient Chart Cardiovascular Medical History: Positive for: Arrhythmia - A Fibb; Hypertension Negative for: Angina; Congestive Heart Failure; Coronary Artery Disease; Deep Vein Thrombosis; Hypotension; Myocardial Infarction; Peripheral Arterial Disease; Peripheral Venous Disease; Phlebitis; Vasculitis Past Medical History Notes: Pacemaker, post mitral valve repair, paroxysymal Afibb, sick sinus syndrome, hyperlipidemia, dysplipidemia Endocrine Medical History: Negative for: Type I Diabetes; Type II Diabetes Past Medical History Notes: hyperthyroidism Neurologic Medical History: Positive for: Neuropathy Immunizations Pneumococcal Vaccine: Received Pneumococcal Vaccination: Yes Received Pneumococcal Vaccination On or  After 60th Birthday: Yes Implantable Devices Yes Hospitalization / Surgery History Type of Hospitalization/Surgery s/p mitral valve repair abd. hysterectomy cardiac cath. pacemaker placemen Family and Social History Unknown History: Yes; Never smoker; Marital Status - Widowed; Alcohol Use: Never; Drug Use: No History; Caffeine Use: Rarely; Financial Concerns: No; Food, Clothing or Shelter Needs: No; Support System Lacking: No; Transportation Concerns: No Electronic Signature(s) Signed: 01/04/2022 1:46:13 PM By: Kalman Shan DO Signed: 01/04/2022 4:44:20 PM By: Rhae Hammock RN Entered By: Kalman Shan on 01/04/2022 12:40:05 -------------------------------------------------------------------------------- SuperBill Details Patient Name: Date of Service: HO LCO [name], Cassey H. 01/04/2022 Medical Record Number: [id number] Patient Account Number: [id number] Date of Birth/Sex: Treating RN: Dec 24, 1928 (88 y.o. [name], [name] Primary Care Provider: Ronnie [name] Other Clinician: Referring Provider: Treating Provider/Extender: [name] in Treatment: 4 Diagnosis Coding ICD-10 Codes Code Description [id number] Non-pressure chronic ulcer of other part of left foot with fat layer exposed G90.09 Other idiopathic peripheral autonomic neuropathy T81.33XA Disruption of traumatic injury wound repair, initial encounter Facility Procedures CPT4 Code: [id number] Description: 99213 - WOUND CARE VISIT-LEV 3 EST PT Modifier: Quantity: 1 Physician Procedures : CPT4 Code Description  Modifier [id number] - WC PHYS LEVEL 3 - EST PT ICD-10 Diagnosis Description L97.522 Non-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

## 2022-01-05 NOTE — Progress Notes (Signed)
[name], [name] ([id number]) Visit Report for 01/04/2022 Allergy List Details Patient Name: Date of Service: [name], [name] 01/04/2022 10:45 A M Medical Record Number: [id number] Patient Account Number: [id number] Date of Birth/Sex: Treating RN: January 10, 1929 (88 y.o. Tonita Phoenix, Lauren Primary Care Jazmyne Beauchesne: Ronnie Doss Other Clinician: Referring Jareb Radoncic: Treating Tamorah Hada/Extender: Marisa Severin Weeks in Treatment: 4 Allergies Active Allergies Statins-HMG-CoA Reductase Inhibitors penicillin Allergy Notes Electronic Signature(s) Signed: 01/04/2022 4:44:20 PM By: Rhae Hammock RN Entered By: Rhae Hammock on 01/04/2022 11:32:23 -------------------------------------------------------------------------------- Arrival Information Details Patient Name: Date of Service: [name], [name] 01/04/2022 10:45 A M Medical Record Number: [id number] Patient Account Number: [id number] Date of Birth/Sex: Treating RN: 1929-02-08 (88 y.o. Tonita Phoenix, Lauren Primary Care Clemens Lachman: Ronnie Doss Other Clinician: Referring Tynlee Bayle: Treating Jassiel Flye/Extender: Marisa Severin Weeks in Treatment: 4 Visit Information Patient Arrived: Wheel Chair Arrival Time: 10:47 Accompanied By: son Transfer Assistance: Manual Patient Identification Verified: Yes Secondary Verification Process Completed: Yes Patient Requires Transmission-Based Precautions: No Patient Has Alerts: Yes Patient Alerts: Patient on Blood Thinner History Since Last Visit Added or deleted any medications: No Any new allergies or adverse reactions: No Had a fall or experienced change in activities of daily living that may affect risk of falls: No Signs or symptoms of abuse/neglect since last visito No Hospitalized since last visit: No Implantable device outside of the clinic excluding cellular tissue based products placed in the center since last visit: No Has Dressing  in Place as Prescribed: Yes Electronic Signature(s) Signed: 01/04/2022 4:44:20 PM By: Rhae Hammock RN Entered By: Rhae Hammock on 01/04/2022 10:47:29 -------------------------------------------------------------------------------- Clinic Level of Care Assessment Details Patient Name: Date of Service: [name], [name] 01/04/2022 10:45 A M Medical Record Number: [id number] Patient Account Number: [id number] Date of Birth/Sex: Treating RN: [birth year] (88 y.o. Tonita Phoenix, Lauren Primary Care Markie Heffernan: Ronnie Doss Other Clinician: Referring Baylin Cabal: Treating Khadija Thier/Extender: Marisa Severin Weeks in Treatment: 4 Clinic Level of Care Assessment Items TOOL 4 Quantity Score X- 1 0 Use when only an EandM is performed on FOLLOW-UP visit ASSESSMENTS - Nursing Assessment / Reassessment X- 1 10 Reassessment of Co-morbidities (includes updates in patient status) X- 1 5 Reassessment of Adherence to Treatment Plan ASSESSMENTS - Wound and Skin A ssessment / Reassessment X - Simple Wound Assessment / Reassessment - one wound 1 5 '[]'$  - 0 Complex Wound Assessment / Reassessment - multiple wounds '[]'$  - 0 Dermatologic / Skin Assessment (not related to wound area) ASSESSMENTS - Focused Assessment X- 1 5 Circumferential Edema Measurements - multi extremities '[]'$  - 0 Nutritional Assessment / Counseling / Intervention '[]'$  - 0 Lower Extremity Assessment (monofilament, tuning fork, pulses) '[]'$  - 0 Peripheral Arterial Disease Assessment (using hand held doppler) ASSESSMENTS - Ostomy and/or Continence Assessment and Care '[]'$  - 0 Incontinence Assessment and Management '[]'$  - 0 Ostomy Care Assessment and Management (repouching, etc.) PROCESS - Coordination of Care X - Simple Patient / Family Education for ongoing care 1 15 '[]'$  - 0 Complex (extensive) Patient / Family Education for ongoing care X- 1 10 Staff obtains Programmer, systems, Records, T Results / Process Orders est '[]'$   - 0 Staff telephones HHA, Nursing Homes / Clarify orders / etc '[]'$  - 0 Routine Transfer to another Facility (non-emergent condition) '[]'$  - 0 Routine Hospital Admission (non-emergent condition) '[]'$  - 0 New Admissions / Biomedical engineer / Ordering NPWT Apligraf, etc. , '[]'$  - 0 Emergency Hospital Admission (emergent condition) X- 1 10 Simple Discharge Coordination '[]'$  -  0 Complex (extensive) Discharge Coordination PROCESS - Special Needs '[]'$  - 0 Pediatric / Minor Patient Management '[]'$  - 0 Isolation Patient Management '[]'$  - 0 Hearing / Language / Visual special needs '[]'$  - 0 Assessment of Community assistance (transportation, D/C planning, etc.) '[]'$  - 0 Additional assistance / Altered mentation '[]'$  - 0 Support Surface(s) Assessment (bed, cushion, seat, etc.) INTERVENTIONS - Wound Cleansing / Measurement X - Simple Wound Cleansing - one wound 1 5 '[]'$  - 0 Complex Wound Cleansing - multiple wounds X- 1 5 Wound Imaging (photographs - any number of wounds) '[]'$  - 0 Wound Tracing (instead of photographs) X- 1 5 Simple Wound Measurement - one wound '[]'$  - 0 Complex Wound Measurement - multiple wounds INTERVENTIONS - Wound Dressings X - Small Wound Dressing one or multiple wounds 1 10 '[]'$  - 0 Medium Wound Dressing one or multiple wounds '[]'$  - 0 Large Wound Dressing one or multiple wounds '[]'$  - 0 Application of Medications - topical '[]'$  - 0 Application of Medications - injection INTERVENTIONS - Miscellaneous '[]'$  - 0 External ear exam '[]'$  - 0 Specimen Collection (cultures, biopsies, blood, body fluids, etc.) '[]'$  - 0 Specimen(s) / Culture(s) sent or taken to Lab for analysis '[]'$  - 0 Patient Transfer (multiple staff / Civil Service fast streamer / Similar devices) '[]'$  - 0 Simple Staple / Suture removal (25 or less) '[]'$  - 0 Complex Staple / Suture removal (26 or more) '[]'$  - 0 Hypo / Hyperglycemic Management (close monitor of Blood Glucose) '[]'$  - 0 Ankle / Brachial Index (ABI) - do not check if billed  separately X- 1 5 Vital Signs Has the patient been seen at the hospital within the last three years: Yes Total Score: 90 Level Of Care: New/Established - Level 3 Electronic Signature(s) Signed: 01/04/2022 4:44:20 PM By: Rhae Hammock RN Entered By: Rhae Hammock on 01/04/2022 11:39:50 -------------------------------------------------------------------------------- Encounter Discharge Information Details Patient Name: Date of Service: [name], [name] 01/04/2022 10:45 A M Medical Record Number: [id number] Patient Account Number: [id number] Date of Birth/Sex: Treating RN: Jun 13, 1929 (88 y.o. Tonita Phoenix, Lauren Primary Care Georgette Helmer: Ronnie Doss Other Clinician: Referring Kayson Tasker: Treating Marceil Welp/Extender: Marisa Severin Weeks in Treatment: 4 Encounter Discharge Information Items Discharge Condition: Stable Ambulatory Status: Wheelchair Discharge Destination: Home Transportation: Private Auto Accompanied By: son Schedule Follow-up Appointment: Yes Clinical Summary of Care: Patient Declined Electronic Signature(s) Signed: 01/04/2022 4:44:20 PM By: Rhae Hammock RN Entered By: Rhae Hammock on 01/04/2022 11:40:22 -------------------------------------------------------------------------------- Lower Extremity Assessment Details Patient Name: Date of Service: [name], [name] 01/04/2022 10:45 A M Medical Record Number: [id number] Patient Account Number: [id number] Date of Birth/Sex: Treating RN: 1929/02/16 (88 y.o. Tonita Phoenix, Lauren Primary Care Taje Tondreau: Ronnie Doss Other Clinician: Referring Jaimi Belle: Treating Benancio Osmundson/Extender: Marisa Severin Weeks in Treatment: 4 Edema Assessment Assessed: [Left: Yes] [Right: No] Edema: [Left: Ye] [Right: s] Calf Left: Right: Point of Measurement: 27 cm From Medial Instep 37.5 cm Ankle Left: Right: Point of Measurement: 9 cm From Medial Instep 31.5  cm Vascular Assessment Pulses: Dorsalis Pedis Palpable: [Left:Yes] Posterior Tibial Palpable: [Left:Yes] Electronic Signature(s) Signed: 01/04/2022 4:44:20 PM By: Rhae Hammock RN Entered By: Rhae Hammock on 01/04/2022 10:49:21 -------------------------------------------------------------------------------- Multi Wound Chart Details Patient Name: Date of Service: [name], [name] 01/04/2022 10:45 A M Medical Record Number: [id number] Patient Account Number: [id number] Date of Birth/Sex: Treating RN: Nov 09, 1928 (88 y.o. Tonita Phoenix, Lauren Primary Care Tasnia Spegal: Ronnie Doss Other Clinician: Referring Jolaine Fryberger: Treating Roen Macgowan/Extender: Marisa Severin Weeks in Treatment:  4 Vital Signs Height(in): 68 Pulse(bpm): 47 Weight(lbs): 146 Blood Pressure(mmHg): 138/76 Body Mass Index(BMI): 22.2 Temperature(F): 98 Respiratory Rate(breaths/min): 17 Photos: [1:Left, Plantar T Great oe] [N/A:N/A N/A] Wound Location: [1:Trauma] [N/A:N/A] Wounding Event: [1:Trauma, Other] [N/A:N/A] Primary Etiology: [1:Arrhythmia, Hypertension, Neuropathy N/A] Comorbid History: [1:11/26/2021] [N/A:N/A] Date Acquired: [1:4] [N/A:N/A] Weeks of Treatment: [1:Open] [N/A:N/A] Wound Status: [1:No] [N/A:N/A] Wound Recurrence: [1:1x1x0.5] [N/A:N/A] Measurements L x W x D (cm) [1:0.785] [N/A:N/A] A (cm) : rea [1:0.393] [N/A:N/A] Volume (cm) : [1:-0.90%] [N/A:N/A] % Reduction in A rea: [1:-151.90%] [N/A:N/A] % Reduction in Volume: [1:10] Starting Position 1 (o'clock): [1:2] Ending Position 1 (o'clock): [1:0.3] Maximum Distance 1 (cm): [1:Yes] [N/A:N/A] Undermining: [1:Full Thickness With Exposed Support N/A] Classification: [1:Structures Medium] [N/A:N/A] Exudate Amount: [1:Serosanguineous] [N/A:N/A] Exudate Type: [1:red, brown] [N/A:N/A] Exudate Color: [1:Distinct, outline attached] [N/A:N/A] Wound Margin: [1:Large (67-100%)] [N/A:N/A] Granulation Amount: [1:Red,  Pink, Hyper-granulation] [N/A:N/A] Granulation Quality: [1:Small (1-33%)] [N/A:N/A] Necrotic Amount: [1:Fat Layer (Subcutaneous Tissue): Yes N/A] Exposed Structures: [1:Bone: Yes Fascia: No Tendon: No Muscle: No Joint: No Small (1-33%)] [N/A:N/A] Treatment Notes Wound #1 (Toe Great) Wound Laterality: Plantar, Left Cleanser Vashe 5.8 (oz) Discharge Instruction: Cleanse the wound with Vashe prior to applying a clean dressing using gauze sponges, not tissue or cotton balls. Peri-Wound Care Topical Primary Dressing FIBRACOL Plus Dressing, 2x2 in (collagen) Discharge Instruction: Moisten collagen with BLASTX BlastX, 0.25 (oz), tube Secondary Dressing Optifoam Non-Adhesive Dressing, 4x4 in Discharge Instruction: foam donut for offloading Woven Gauze Sponge, Non-Sterile 4x4 in Discharge Instruction: Apply over primary dressing as directed. Secured With Conforming Stretch Gauze Bandage, Sterile 2x75 (in/in) Discharge Instruction: Secure with stretch gauze as directed. 33M Medipore H Soft Cloth Surgical T ape, 4 x 10 (in/yd) Discharge Instruction: Secure with tape as directed. Compression Wrap Compression Stockings Add-Ons Cover-roll Stretch Fixation Dressing, 2x10 (in/yd) Electronic Signature(s) Signed: 01/04/2022 1:46:13 PM By: Kalman Shan DO Signed: 01/04/2022 4:44:20 PM By: Rhae Hammock RN Entered By: Kalman Shan on 01/04/2022 12:38:59 -------------------------------------------------------------------------------- Multi-Disciplinary Care Plan Details Patient Name: Date of Service: [name], [name] 01/04/2022 10:45 A M Medical Record Number: [id number] Patient Account Number: [id number] Date of Birth/Sex: Treating RN: May 02, 1929 (88 y.o. Tonita Phoenix, Lauren Primary Care Shell Blanchette: Ronnie Doss Other Clinician: Referring Manasvini Whatley: Treating Jameila Keeny/Extender: Marisa Severin Weeks in Treatment: 4 Active Inactive Wound/Skin  Impairment Nursing Diagnoses: Impaired tissue integrity Knowledge deficit related to ulceration/compromised skin integrity Goals: Patient will have a decrease in wound volume by X% from date: (specify in notes) Date Initiated: 12/07/2021 Target Resolution Date: 01/16/2022 Goal Status: Active Patient/caregiver will verbalize understanding of skin care regimen Date Initiated: 12/07/2021 Target Resolution Date: 01/22/2022 Goal Status: Active Ulcer/skin breakdown will have a volume reduction of 30% by week 4 Date Initiated: 12/07/2021 Target Resolution Date: 01/23/2022 Goal Status: Active Interventions: Assess patient/caregiver ability to obtain necessary supplies Assess patient/caregiver ability to perform ulcer/skin care regimen upon admission and as needed Assess ulceration(s) every visit Notes: Electronic Signature(s) Signed: 01/04/2022 4:44:20 PM By: Rhae Hammock RN Entered By: Rhae Hammock on 01/04/2022 10:58:40 -------------------------------------------------------------------------------- Pain Assessment Details Patient Name: Date of Service: [name], [name] 01/04/2022 10:45 A M Medical Record Number: [id number] Patient Account Number: [id number] Date of Birth/Sex: Treating RN: 1929/06/09 (88 y.o. Tonita Phoenix, Lauren Primary Care Amelio Brosky: Ronnie Doss Other Clinician: Referring Maribella Kuna: Treating Cicero Noy/Extender: Marisa Severin Weeks in Treatment: 4 Active Problems Location of Pain Severity and Description of Pain Patient Has Paino No Site Locations Pain Management and Medication Current Pain Management: Electronic Signature(s) Signed: 01/04/2022  4:44:20 PM By: Rhae Hammock RN Entered By: Rhae Hammock on 01/04/2022 10:48:04 -------------------------------------------------------------------------------- Patient/Caregiver Education Details Patient Name: Date of Service: [name], [name] 7/10/2023andnbsp10:45 A  M Medical Record Number: [id number] Patient Account Number: [id number] Date of Birth/Gender: Treating RN: 03-24-1929 (88 y.o. [name] Primary Care Physician: Ronnie Doss Other Clinician: Referring Physician: Treating Physician/Extender: Marisa Severin Weeks in Treatment: 4 Education Assessment Education Provided To: Patient Education Topics Provided Wound/Skin Impairment: Methods: Explain/Verbal Responses: Reinforcements needed, State content correctly Electronic Signature(s) Signed: 01/04/2022 4:44:20 PM By: Rhae Hammock RN Entered By: Rhae Hammock on 01/04/2022 10:58:55 -------------------------------------------------------------------------------- Wound Assessment Details Patient Name: Date of Service: [name], [name] 01/04/2022 10:45 A M Medical Record Number: [id number] Patient Account Number: [id number] Date of Birth/Sex: Treating RN: July 07, 1928 (88 y.o. Tonita Phoenix, Lauren Primary Care Riordan Walle: Ronnie Doss Other Clinician: Referring Cimone Fahey: Treating Earnie Bechard/Extender: Marisa Severin Weeks in Treatment: 4 Wound Status Wound Number: 1 Primary Etiology: Trauma, Other Wound Location: Left, Plantar T Great oe Wound Status: Open Wounding Event: Trauma Comorbid History: Arrhythmia, Hypertension, Neuropathy Date Acquired: 11/26/2021 Weeks Of Treatment: 4 Clustered Wound: No Photos Wound Measurements Length: (cm) 1 Width: (cm) 1 Depth: (cm) 0.5 Area: (cm) 0.785 Volume: (cm) 0.393 % Reduction in Area: -0.9% % Reduction in Volume: -151.9% Epithelialization: Small (1-33%) Tunneling: No Undermining: Yes Starting Position (o'clock): 10 Ending Position (o'clock): 2 Maximum Distance: (cm) 0.3 Wound Description Classification: Full Thickness With Exposed Support Structures Wound Margin: Distinct, outline attached Exudate Amount: Medium Exudate Type: Serosanguineous Exudate Color: red,  brown Foul Odor After Cleansing: No Slough/Fibrino Yes Wound Bed Granulation Amount: Large (67-100%) Exposed Structure Granulation Quality: Red, Pink, Hyper-granulation Fascia Exposed: No Necrotic Amount: Small (1-33%) Fat Layer (Subcutaneous Tissue) Exposed: Yes Necrotic Quality: Adherent Slough Tendon Exposed: No Muscle Exposed: No Joint Exposed: No Bone Exposed: Yes Treatment Notes Wound #1 (Toe Great) Wound Laterality: Plantar, Left Cleanser Vashe 5.8 (oz) Discharge Instruction: Cleanse the wound with Vashe prior to applying a clean dressing using gauze sponges, not tissue or cotton balls. Peri-Wound Care Topical Primary Dressing FIBRACOL Plus Dressing, 2x2 in (collagen) Discharge Instruction: Moisten collagen with BLASTX BlastX, 0.25 (oz), tube Secondary Dressing Optifoam Non-Adhesive Dressing, 4x4 in Discharge Instruction: foam donut for offloading Woven Gauze Sponge, Non-Sterile 4x4 in Discharge Instruction: Apply over primary dressing as directed. Secured With Conforming Stretch Gauze Bandage, Sterile 2x75 (in/in) Discharge Instruction: Secure with stretch gauze as directed. 56M Medipore H Soft Cloth Surgical T ape, 4 x 10 (in/yd) Discharge Instruction: Secure with tape as directed. Compression Wrap Compression Stockings Add-Ons Cover-roll Stretch Fixation Dressing, 2x10 (in/yd) Electronic Signature(s) Signed: 01/04/2022 4:44:20 PM By: Rhae Hammock RN Signed: 01/05/2022 10:34:18 AM By: Erenest Blank Entered By: Erenest Blank on 01/04/2022 10:54:41 -------------------------------------------------------------------------------- Vitals Details Patient Name: Date of Service: [name], [name] 01/04/2022 10:45 A M Medical Record Number: [id number] Patient Account Number: [id number] Date of Birth/Sex: Treating RN: 12-09-1928 (88 y.o. Tonita Phoenix, Lauren Primary Care Airen Stiehl: Ronnie Doss Other Clinician: Referring Melina Mosteller: Treating  Rosevelt Luu/Extender: Marisa Severin Weeks in Treatment: 4 Vital Signs Time Taken: 10:47 Temperature (F): 98 Height (in): 68 Pulse (bpm): 79 Weight (lbs): 146 Respiratory Rate (breaths/min): 17 Body Mass Index (BMI): 22.2 Blood Pressure (mmHg): 138/76 Reference Range: 80 - 120 mg / dl Electronic Signature(s) Signed: 01/04/2022 4:44:20 PM By: Rhae Hammock RN Entered By: Rhae Hammock on 01/04/2022 10:47:58

## 2022-01-08 ENCOUNTER — Other Ambulatory Visit: Payer: Self-pay

## 2022-01-08 ENCOUNTER — Ambulatory Visit (INDEPENDENT_AMBULATORY_CARE_PROVIDER_SITE_OTHER): Payer: MEDICARE | Admitting: Family Medicine

## 2022-01-08 ENCOUNTER — Other Ambulatory Visit: Payer: MEDICARE

## 2022-01-08 ENCOUNTER — Encounter: Payer: Self-pay | Admitting: Family Medicine

## 2022-01-08 VITALS — BP 126/67 | HR 69 | Temp 97.6°F | Ht 66.0 in | Wt 148.6 lb

## 2022-01-08 DIAGNOSIS — R791 Abnormal coagulation profile: Secondary | ICD-10-CM | POA: Diagnosis not present

## 2022-01-08 DIAGNOSIS — M21062 Valgus deformity, not elsewhere classified, left knee: Secondary | ICD-10-CM | POA: Diagnosis not present

## 2022-01-08 DIAGNOSIS — M21061 Valgus deformity, not elsewhere classified, right knee: Secondary | ICD-10-CM

## 2022-01-08 DIAGNOSIS — Z7901 Long term (current) use of anticoagulants: Secondary | ICD-10-CM

## 2022-01-08 DIAGNOSIS — I480 Paroxysmal atrial fibrillation: Secondary | ICD-10-CM

## 2022-01-08 DIAGNOSIS — Z7409 Other reduced mobility: Secondary | ICD-10-CM | POA: Diagnosis not present

## 2022-01-08 LAB — COAGUCHEK XS/INR WAIVED
INR: 3.3 — ABNORMAL HIGH (ref 0.9–1.1)
Prothrombin Time: 39.9 s

## 2022-01-08 LAB — POCT INR: INR: 3.3 — AB (ref 2–3)

## 2022-01-08 NOTE — Progress Notes (Signed)
Subjective: CC: INR checkup PCP: [name], DO [name] is a 88 y.o. female presenting to clinic today for:  1.  Atrial fibrillation Patient with history of atrial fibrillation status post mitral valve repair.  Goal INR 2-3.  She was recently placed on Levaquin by her wound care specialist.  She has 2 more days left of that medicine but is here for interval INR checkup due to use of this antibiotic.  She does not report any bleeding but does not feel that she felt better overall when she was only on 5 mg daily of the Coumadin.  2.  Impaired mobility Patient reports difficulty with ambulation with her cane, particularly when she is going outside of the home.  She has deformity of the knees.  Gait is extremely impaired.  She would like to have a transport wheelchair if possible   ROS: Per HPI  Allergies  Allergen Reactions   Statins Other (See Comments)    Myalgia   Past Medical History:  Diagnosis Date   A-fib ([place])    Dyslipidemia    Hyperlipidemia    Hypothyroidism    2 degree amidarone    Pacemaker    PAF (paroxysmal atrial fibrillation) ([facility])    S/P mitral valve repair    SSS (sick sinus syndrome) (H[facility])    Systemic hypertension     Current Outpatient Medications:    levofloxacin (LEVAQUIN) 750 MG tablet, Take 750 mg by mouth daily., Disp: , Rfl:    levothyroxine (SYNTHROID) 25 MCG tablet, TAKE 1 TABLET DAILY, Disp: 90 tablet, Rfl: 3   metoprolol tartrate (LOPRESSOR) 50 MG tablet, TAKE 1 TABLET TWICE A DAY, Disp: 180 tablet, Rfl: 3   Polyethyl Glycol-Propyl Glycol (SYSTANE OP), Apply 1 drop to eye daily as needed (dry eyes)., Disp: , Rfl:    triamterene-hydrochlorothiazide (DYAZIDE) 37.5-25 MG capsule, TAKE 1 CAPSULE DAILY, Disp: 90 capsule, Rfl: 3   warfarin (COUMADIN) 2.5 MG tablet, 1 tablet by mouth daily except 2 tablets on Fridays., Disp: 90 tablet, Rfl: 3   furosemide (LASIX) 20 MG tablet, Take 1 tablet (20 mg total) by mouth daily for 3 days.,  Disp: 3 tablet, Rfl: 0 Social History   Socioeconomic History   Marital status: Widowed    Spouse name: Not on file   Number of children: 3   Years of education: Not on file   Highest education level: High school graduate  Occupational History   Occupation: Retired  Tobacco Use   Smoking status: Never   Smokeless tobacco: Never  Vaping Use   Vaping Use: Never used  Substance and Sexual Activity   Alcohol use: No   Drug use: Never   Sexual activity: Not Currently  Other Topics Concern   Not on file  Social History Narrative   Husband passed 06/29/2021   2 sons live with her   Social Determinants of Health   Financial Resource Strain: Low Risk  (09/14/2021)   Overall Financial Resource Strain (CARDIA)    Difficulty of Paying Living Expenses: Not hard at all  Food Insecurity: No Food Insecurity (09/14/2021)   Hunger Vital Sign    Worried About Running Out of Food in the Last Year: Never true    [place] in the Last Year: Never true  Transportation Needs: No Transportation Needs (09/14/2021)   PRAPARE - Hydrologist (Medical): No    Lack of Transportation (Non-Medical): No  Physical Activity: Insufficiently Active (09/14/2021)  Exercise Vital Sign    Days of Exercise per Week: 7 days    Minutes of Exercise per Session: 10 min  Stress: No Stress Concern Present (09/14/2021)   [place]    Feeling of Stress : Only a little  Social Connections: Socially Isolated (09/14/2021)   Social Connection and Isolation Panel [NHANES]    Frequency of Communication with Friends and Family: More than three times a week    Frequency of Social Gatherings with Friends and Family: More than three times a week    Attends Religious Services: Never    Marine scientist or Organizations: No    Attends Archivist Meetings: Never    Marital Status: Widowed  Intimate Partner Violence:  Not At Risk (09/14/2021)   Humiliation, Afraid, Rape, and Kick questionnaire    Fear of Current or Ex-Partner: No    Emotionally Abused: No    Physically Abused: No    Sexually Abused: No   Family History  Problem Relation Age of Onset   Heart attack Mother    Heart attack Father    Parkinson's disease Son     Objective: Office vital signs reviewed. BP 126/67   Pulse 69   Temp 97.6 F (36.4 C)   Ht '5\' 6"'$  (1.676 m)   Wt 148 lb 9.6 oz (67.4 kg)   SpO2 99%   BMI 23.98 kg/m   Physical Examination:  General: Awake, alert, nontoxic elderly female, No acute distress HEENT: Sclera white Cardio: regular rate and rhythm, S1S2 heard, no murmurs appreciated Pulm: clear to auscultation bilaterally, no wheezes, rhonchi or rales; normal work of breathing on room air MSK: Arrives in wheelchair.  Severe valgus deformity of knees.  Osteoarthritic changes to the knees and back.  Gait is unsteady   Assessment/ Plan: 88 y.o. female   Supratherapeutic INR - Plan: POCT INR  Paroxysmal atrial fibrillation ([place]) - Plan: POCT INR, DME Wheelchair manual  Acquired genu valgum of both knees - Plan: DME Wheelchair manual  Impaired mobility and endurance - Plan: DME Wheelchair manual  INR supratherapeutic at 3.3.  She will skip her 10 mg dose today and then resume 5 mg daily with 10 mg on Fridays.  We will see her again next Friday for INR recheck.  She still has 2 days left of Levaquin.  Transport wheelchair ordered for the patient.  She has acquired valgus deformity of bilateral knees that severely impact her ability to ambulate and most certainly have degenerative joint impact on this patient who is utilizing cane but is increasingly unstable on her feet.  I have given her the written prescription and she will bring it to a location of her choice  No orders of the defined types were placed in this encounter.  No orders of the defined types were placed in this encounter.    [name], DO [place] [phone]

## 2022-01-08 NOTE — Patient Instructions (Signed)
HOLD today's dose then resume normal Coumadin schedule.

## 2022-01-11 ENCOUNTER — Encounter (HOSPITAL_BASED_OUTPATIENT_CLINIC_OR_DEPARTMENT_OTHER): Payer: MEDICARE | Admitting: Internal Medicine

## 2022-01-11 DIAGNOSIS — L97522 Non-pressure chronic ulcer of other part of left foot with fat layer exposed: Secondary | ICD-10-CM | POA: Diagnosis not present

## 2022-01-11 DIAGNOSIS — G9009 Other idiopathic peripheral autonomic neuropathy: Secondary | ICD-10-CM

## 2022-01-11 DIAGNOSIS — I480 Paroxysmal atrial fibrillation: Secondary | ICD-10-CM | POA: Diagnosis not present

## 2022-01-11 DIAGNOSIS — Z950 Presence of cardiac pacemaker: Secondary | ICD-10-CM | POA: Diagnosis not present

## 2022-01-11 DIAGNOSIS — T8133XA Disruption of traumatic injury wound repair, initial encounter: Secondary | ICD-10-CM

## 2022-01-11 DIAGNOSIS — I10 Essential (primary) hypertension: Secondary | ICD-10-CM | POA: Diagnosis not present

## 2022-01-11 NOTE — Progress Notes (Signed)
[name] ([id number]) Visit Report for 01/11/2022 Chief Complaint Document Details Patient Name: Date of Service: [name], [name] 01/11/2022 10:00 A M Medical Record Number: [id number] Patient Account Number: [id number] Date of Birth/Sex: Treating RN: [birth year] (88 y.o. [name], Lauren Primary Care Provider: Ronnie Doss Other Clinician: Referring Provider: Treating Provider/Extender: Marisa Severin Weeks in Treatment: 5 Information Obtained from: Patient Chief Complaint 12/07/2021; left plantar toe wound Electronic Signature(s) Signed: 01/11/2022 12:27:16 PM By: Kalman Shan DO Entered By: Kalman Shan on 01/11/2022 11:09:05 -------------------------------------------------------------------------------- HPI Details Patient Name: Date of Service: [name], [name] 01/11/2022 10:00 A M Medical Record Number: [id number] Patient Account Number: [id number] Date of Birth/Sex: Treating RN: [birth year] (88 y.o. [name] Primary Care Provider: Ronnie Doss Other Clinician: Referring Provider: Treating Provider/Extender: Marisa Severin Weeks in Treatment: 5 History of Present Illness HPI Description: Admission 12/07/2021 Ms. [name] is a 88 year old female with a past medical history of chronic venous insufficiency, idiopathic peripheral neuropathy, paroxysmal A-fib on Coumadin that presents to the clinic for a 2 week history of nonhealing ulcer to the left plantar great toe. She had a callus to this area that she attempted to remove with scissors and subsequently developed a wound. She currently denies systemic signs of infection. She is keeping the area covered. She is currently on Keflex for left lower extremity cellulitis by her primary care office. 6/19; patient presents for follow-up. She obtained her blast X and collagen and has been using this daily. She has no issues or complaints today. She  denies signs of infection. 6/26; patient presents for follow-up. She has been using blast X and collagen without issues. She has no complaints today. She denies signs of infection. She has been using the offloading felt donut pad. 7/10; patient presents for follow-up. A PCR culture was done at last clinic visit that showed high levels of Enterococcus bacillus and Proteus mirabilis. I had recommended Keystone antibiotics however patient declines this. She has been using blast X and collagen without issues. She has no issues or complaints today. 7/17; patient presents for follow-up. She continues to use blast X and collagen. She completed her course of Levaquin without issues. Electronic Signature(s) Signed: 01/11/2022 12:27:16 PM By: Kalman Shan DO Entered By: Kalman Shan on 01/11/2022 11:09:58 -------------------------------------------------------------------------------- Physical Exam Details Patient Name: Date of Service: [name], [name] 01/11/2022 10:00 A M Medical Record Number: [id number] Patient Account Number: [id number] Date of Birth/Sex: Treating RN: 15-Jan-1929 (88 y.o. [name] Primary Care Provider: Ronnie Doss Other Clinician: Referring Provider: Treating Provider/Extender: Marisa Severin Weeks in Treatment: 5 Constitutional respirations regular, non-labored and within target range for patient.. Cardiovascular 2+ dorsalis pedis/posterior tibialis pulses. Psychiatric pleasant and cooperative. Notes Left lower extremity: T the plantar left great toe there is an open wound with Granulation tissue and Increased depth to the more proximal aspect. This probes o close to bone. No surrounding signs of infection. Electronic Signature(s) Signed: 01/11/2022 12:27:16 PM By: Kalman Shan DO Entered By: Kalman Shan on 01/11/2022  11:12:30 -------------------------------------------------------------------------------- Physician Orders Details Patient Name: Date of Service: [name], [name] 01/11/2022 10:00 A M Medical Record Number: [id number] Patient Account Number: [id number] Date of Birth/Sex: Treating RN: [birth year] (88 y.o. [name] Primary Care Provider: Ronnie Doss Other Clinician: Referring Provider: Treating Provider/Extender: Marisa Severin Weeks in Treatment: 5 Verbal / Phone Orders: No Diagnosis Coding Follow-up Appointments ppointment in 1 week. - 01/18/22 @  1045 w/ Dr. Heber [place] and Allayne Butcher [employer] covering), Room # 9 Return A Bathing/ Shower/ Hygiene May shower with protection but do not get wound dressing(s) wet. Edema Control - Lymphedema / SCD / Other Elevate legs to the level of the heart or above for 30 minutes daily and/or when sitting, a frequency of: Avoid standing for long periods of time. Off-Loading Open toe surgical shoe to: - LEFT FOOT Wound Treatment Wound #1 - T Great oe Wound Laterality: Plantar, Left Cleanser: Vashe 5.8 (oz) (Generic) 1 x Per Day/15 Days Discharge Instructions: Cleanse the wound with Vashe prior to applying a clean dressing using gauze sponges, not tissue or cotton balls. Prim Dressing: FIBRACOL Plus Dressing, 2x2 in (collagen) 1 x Per Day/15 Days ary Discharge Instructions: Moisten collagen with BLASTX Prim Dressing: BlastX, 0.25 (oz), tube ary 1 x Per Day/15 Days Secondary Dressing: Optifoam Non-Adhesive Dressing, 4x4 in (Generic) 1 x Per Day/15 Days Discharge Instructions: foam donut for offloading Secondary Dressing: Woven Gauze Sponge, Non-Sterile 4x4 in (Generic) 1 x Per Day/15 Days Discharge Instructions: Apply over primary dressing as directed. Secured With: Child psychotherapist, Sterile 2x75 (in/in) (Generic) 1 x Per Day/15 Days Discharge Instructions: Secure with stretch gauze as directed. Secured  With: 30M Medipore H Soft Cloth Surgical T ape, 4 x 10 (in/yd) (Generic) 1 x Per Day/15 Days Discharge Instructions: Secure with tape as directed. Add-Ons: Cover-roll Stretch Fixation Dressing, 2x10 (in/yd) (Generic) 1 x Per Day/15 Days Radiology Computed Tomography (CT) Scan , Lower extremity with contrast - Left Great T with contrast; oe; Electronic Signature(s) Signed: 01/11/2022 12:27:16 PM By: Kalman Shan DO Entered By: Kalman Shan on 01/11/2022 11:12:40 Prescription 01/11/2022 -------------------------------------------------------------------------------- Isabel Caprice DO Patient Name: Provider: [birth year] [id number] Date of Birth: NPI#Corliss Marcus Sex: DEA #: [phone] Phone #: License #: [place] Patient Address: [address], [place] [address] [place], [place] [address] [phone] Allergies Statins-HMG-CoA Reductase Inhibitors; penicillin Provider's Orders Computed Tomography (CT) Scan , Lower extremity with contrast - Left Great T with contrast; oe; Hand Signature: Date(s): Electronic Signature(s) Signed: 01/11/2022 12:27:16 PM By: Kalman Shan DO Entered By: Kalman Shan on 01/11/2022 11:12:40 -------------------------------------------------------------------------------- Problem List Details Patient Name: Date of Service: [name], [name] 01/11/2022 10:00 A M Medical Record Number: [id number] Patient Account Number: [id number] Date of Birth/Sex: Treating RN: 02-14-1929 (88 y.o. [name], Lauren Primary Care Provider: Ronnie Doss Other Clinician: Referring Provider: Treating Provider/Extender: Marisa Severin Weeks in Treatment: 5 Active Problems ICD-10 Encounter Code Description Active Date MDM Diagnosis [id number] Non-pressure chronic ulcer of other part of left foot with fat layer exposed  12/07/2021 No Yes G90.09 Other idiopathic peripheral autonomic neuropathy 12/07/2021 No Yes T81.33XA Disruption of traumatic injury wound repair, initial encounter 12/07/2021 No Yes Inactive Problems Resolved Problems Electronic Signature(s) Signed: 01/11/2022 12:27:16 PM By: Kalman Shan DO Entered By: Kalman Shan on 01/11/2022 11:08:38 -------------------------------------------------------------------------------- Progress Note Details Patient Name: Date of Service: [name], [name] 01/11/2022 10:00 A M Medical Record Number: [id number] Patient Account Number: [id number] Date of Birth/Sex: Treating RN: April 16, 1929 (88 y.o. [name] Primary Care Provider: Ronnie Doss Other Clinician: Referring Provider: Treating Provider/Extender: Marisa Severin Weeks in Treatment: 5 Subjective Chief Complaint Information obtained from Patient 12/07/2021; left plantar toe wound History of Present Illness (HPI) Admission 12/07/2021 Ms. Karolyne Timmons is a 88 year old female with a past medical history of chronic  venous insufficiency, idiopathic peripheral neuropathy, paroxysmal A-fib on Coumadin that presents to the clinic for a 2 week history of nonhealing ulcer to the left plantar great toe. She had a callus to this area that she attempted to remove with scissors and subsequently developed a wound. She currently denies systemic signs of infection. She is keeping the area covered. She is currently on Keflex for left lower extremity cellulitis by her primary care office. 6/19; patient presents for follow-up. She obtained her blast X and collagen and has been using this daily. She has no issues or complaints today. She denies signs of infection. 6/26; patient presents for follow-up. She has been using blast X and collagen without issues. She has no complaints today. She denies signs of infection. She has been using the offloading felt donut pad. 7/10; patient  presents for follow-up. A PCR culture was done at last clinic visit that showed high levels of Enterococcus bacillus and Proteus mirabilis. I had recommended Keystone antibiotics however patient declines this. She has been using blast X and collagen without issues. She has no issues or complaints today. 7/17; patient presents for follow-up. She continues to use blast X and collagen. She completed her course of Levaquin without issues. Patient History Information obtained from Patient, Chart. Family History Unknown History. Social History Never smoker, Marital Status - Widowed, Alcohol Use - Never, Drug Use - No History, Caffeine Use - Rarely. Medical History Cardiovascular Patient has history of Arrhythmia - A Fibb, Hypertension Denies history of Angina, Congestive Heart Failure, Coronary Artery Disease, Deep Vein Thrombosis, Hypotension, Myocardial Infarction, Peripheral Arterial Disease, Peripheral Venous Disease, Phlebitis, Vasculitis Endocrine Denies history of Type I Diabetes, Type II Diabetes Neurologic Patient has history of Neuropathy Hospitalization/Surgery History - s/p mitral valve repair. - abd. hysterectomy. - cardiac cath.. - pacemaker placemen. Medical A Surgical History Notes nd Cardiovascular Pacemaker, post mitral valve repair, paroxysymal Afibb, sick sinus syndrome, hyperlipidemia, dysplipidemia Endocrine hyperthyroidism Objective Constitutional respirations regular, non-labored and within target range for patient.. Vitals Time Taken: 10:13 AM, Height: 68 in, Weight: 146 lbs, BMI: 22.2, Temperature: 98.7 F, Pulse: 74 bpm, Respiratory Rate: 17 breaths/min, Blood Pressure: 119/74 mmHg. Cardiovascular 2+ dorsalis pedis/posterior tibialis pulses. Psychiatric pleasant and cooperative. General Notes: Left lower extremity: T the plantar left great toe there is an open wound with Granulation tissue and Increased depth to the more proximal o aspect. This probes close  to bone. No surrounding signs of infection. Integumentary (Hair, Skin) Wound #1 status is Open. Original cause of wound was Trauma. The date acquired was: 11/26/2021. The wound has been in treatment 5 weeks. The wound is located on the [employer]. The wound measures 0.7cm length x 0.7cm width x 0.6cm depth; 0.385cm^2 area and 0.231cm^3 volume. There is bone oe and Fat Layer (Subcutaneous Tissue) exposed. There is no tunneling noted, however, there is undermining starting at 12:00 and ending at 12:00 with a maximum distance of 0.7cm. There is a medium amount of serosanguineous drainage noted. The wound margin is distinct with the outline attached to the wound base. There is large (67-100%) red, pink, hyper - granulation within the wound bed. There is a small (1-33%) amount of necrotic tissue within the wound bed including Adherent Slough. Assessment Active Problems ICD-10 Non-pressure chronic ulcer of other part of left foot with fat layer exposed Other idiopathic peripheral autonomic neuropathy Disruption of traumatic injury wound repair, initial encounter Patient's wound is stable. X-ray did not show obvious bony destruction suggesting osteomyelitis. On exam I  am probing very close to bone. I recommended a CT scan for further assessment. She cannot have an MRI due to pacemaker. For now I recommended continuing blast X and collagen. No obvious signs of soft tissue infection requiring further antibiotics. Patient does not want to use Keystone antibiotics. Plan Follow-up Appointments: Return Appointment in 1 week. - 01/18/22 @ 1045 w/ Dr. Heber [place] and Alcide Clever covering), Room # 9 Bathing/ Shower/ Hygiene: May shower with protection but do not get wound dressing(s) wet. Edema Control - Lymphedema / SCD / Other: Elevate legs to the level of the heart or above for 30 minutes daily and/or when sitting, a frequency of: Avoid standing for long periods of time. Off-Loading: Open toe  surgical shoe to: - LEFT FOOT Radiology ordered were: Computed T omography (CT) Scan , Lower extremity with contrast - Left Great T with contrast; oe; WOUND #1: - T Great Wound Laterality: Plantar, Left oe Cleanser: Vashe 5.8 (oz) (Generic) 1 x Per Day/15 Days Discharge Instructions: Cleanse the wound with Vashe prior to applying a clean dressing using gauze sponges, not tissue or cotton balls. Prim Dressing: FIBRACOL Plus Dressing, 2x2 in (collagen) 1 x Per Day/15 Days ary Discharge Instructions: Moisten collagen with BLASTX Prim Dressing: BlastX, 0.25 (oz), tube 1 x Per Day/15 Days ary Secondary Dressing: Optifoam Non-Adhesive Dressing, 4x4 in (Generic) 1 x Per Day/15 Days Discharge Instructions: foam donut for offloading Secondary Dressing: Woven Gauze Sponge, Non-Sterile 4x4 in (Generic) 1 x Per Day/15 Days Discharge Instructions: Apply over primary dressing as directed. Secured With: Child psychotherapist, Sterile 2x75 (in/in) (Generic) 1 x Per Day/15 Days Discharge Instructions: Secure with stretch gauze as directed. Secured With: 12M Medipore H Soft Cloth Surgical T ape, 4 x 10 (in/yd) (Generic) 1 x Per Day/15 Days Discharge Instructions: Secure with tape as directed. Add-Ons: Cover-roll Stretch Fixation Dressing, 2x10 (in/yd) (Generic) 1 x Per Day/15 Days 1. Blast X with collagen 2. Aggressive offloadingoosurgical shoe with foam donut 3. CT scan of the left foot Electronic Signature(s) Signed: 01/11/2022 12:27:16 PM By: Kalman Shan DO Entered By: Kalman Shan on 01/11/2022 11:15:25 -------------------------------------------------------------------------------- HxROS Details Patient Name: Date of Service: [name], [name] 01/11/2022 10:00 A M Medical Record Number: [id number] Patient Account Number: [id number] Date of Birth/Sex: Treating RN: 1929-06-10 (88 y.o. [name], Lauren Primary Care Provider: Ronnie Doss Other Clinician: Referring  Provider: Treating Provider/Extender: Marisa Severin Weeks in Treatment: 5 Information Obtained From Patient Chart Cardiovascular Medical History: Positive for: Arrhythmia - A Fibb; Hypertension Negative for: Angina; Congestive Heart Failure; Coronary Artery Disease; Deep Vein Thrombosis; Hypotension; Myocardial Infarction; Peripheral Arterial Disease; Peripheral Venous Disease; Phlebitis; Vasculitis Past Medical History Notes: Pacemaker, post mitral valve repair, paroxysymal Afibb, sick sinus syndrome, hyperlipidemia, dysplipidemia Endocrine Medical History: Negative for: Type I Diabetes; Type II Diabetes Past Medical History Notes: hyperthyroidism Neurologic Medical History: Positive for: Neuropathy Immunizations Pneumococcal Vaccine: Received Pneumococcal Vaccination: Yes Received Pneumococcal Vaccination On or After 60th Birthday: Yes Implantable Devices Yes Hospitalization / Surgery History Type of Hospitalization/Surgery s/p mitral valve repair abd. hysterectomy cardiac cath. pacemaker placemen Family and Social History Unknown History: Yes; Never smoker; Marital Status - Widowed; Alcohol Use: Never; Drug Use: No History; Caffeine Use: Rarely; Financial Concerns: No; Food, Clothing or Shelter Needs: No; Support System Lacking: No; Transportation Concerns: No Electronic Signature(s) Signed: 01/11/2022 12:27:16 PM By: Kalman Shan DO Signed: 01/11/2022 3:45:51 PM By: Rhae Hammock RN Entered By: Kalman Shan on 01/11/2022 11:10:05 -------------------------------------------------------------------------------- SuperBill Details Patient Name:  Date of Service: [name], [name] 01/11/2022 Medical Record Number: [id number] Patient Account Number: [id number] Date of Birth/Sex: Treating RN: 11-20-1928 (88 y.o. [name], Lauren Primary Care Provider: Ronnie Doss Other Clinician: Referring Provider: Treating Provider/Extender:  Marisa Severin Weeks in Treatment: 5 Diagnosis Coding ICD-10 Codes Code Description [id number] Non-pressure chronic ulcer of other part of left foot with fat layer exposed G90.09 Other idiopathic peripheral autonomic neuropathy T81.33XA Disruption of traumatic injury wound repair, initial encounter Facility Procedures CPT4 Code: [id number] Description: 99213 - WOUND CARE VISIT-LEV 3 EST PT Modifier: Quantity: 1 Physician Procedures : CPT4 Code Description Modifier [id number] - WC PHYS LEVEL 3 - EST PT ICD-10 Diagnosis Description L97.522 Non-pressure chronic ulcer of other part of left foot with fat layer exposed G90.09 Other idiopathic peripheral autonomic neuropathy T81.33XA  Disruption of traumatic injury wound repair, initial encounter Quantity: 1 Electronic Signature(s) Signed: 01/11/2022 12:27:16 PM By: Kalman Shan DO Entered By: Kalman Shan on 01/11/2022 12:21:50

## 2022-01-11 NOTE — Progress Notes (Signed)
LARHONDA, DETTLOFF ([id number]) Visit Report for 01/11/2022 Arrival Information Details Patient Name: Date of Service: [name], [name] 01/11/2022 10:00 A M Medical Record Number: [id number] Patient Account Number: [id number] Date of Birth/Sex: Treating RN: 02/13/1929 (88 y.o. [name], Lauren Primary Care Gita Dilger: Ronnie Doss Other Clinician: Referring Halana Deisher: Treating Chazz Philson/Extender: Marisa Severin Weeks in Treatment: 5 Visit Information History Since Last Visit Added or deleted any medications: No Patient Arrived: Wheel Chair Any new allergies or adverse reactions: No Arrival Time: 10:12 Had a fall or experienced change in No Accompanied By: son activities of daily living that may affect Transfer Assistance: Manual risk of falls: Patient Identification Verified: Yes Signs or symptoms of abuse/neglect since last visito No Secondary Verification Process Completed: Yes Hospitalized since last visit: No Patient Requires Transmission-Based Precautions: No Implantable device outside of the clinic excluding No Patient Has Alerts: Yes cellular tissue based products placed in the center Patient Alerts: Patient on Blood Thinner since last visit: Has Dressing in Place as Prescribed: Yes Pain Present Now: No Electronic Signature(s) Signed: 01/11/2022 3:45:51 PM By: Rhae Hammock RN Entered By: Rhae Hammock on 01/11/2022 10:13:03 -------------------------------------------------------------------------------- Clinic Level of Care Assessment Details Patient Name: Date of Service: [name], [name] 01/11/2022 10:00 A M Medical Record Number: [id number] Patient Account Number: [id number] Date of Birth/Sex: Treating RN: 26-Sep-1928 (88 y.o. [name], Lauren Primary Care Lailani Tool: Ronnie Doss Other Clinician: Referring Ramia Sidney: Treating Kaysi Ourada/Extender: Marisa Severin Weeks in Treatment: 5 Clinic Level of Care  Assessment Items TOOL 4 Quantity Score X- 1 0 Use when only an EandM is performed on FOLLOW-UP visit ASSESSMENTS - Nursing Assessment / Reassessment X- 1 10 Reassessment of Co-morbidities (includes updates in patient status) X- 1 5 Reassessment of Adherence to Treatment Plan ASSESSMENTS - Wound and Skin A ssessment / Reassessment X - Simple Wound Assessment / Reassessment - one wound 1 5 '[]'$  - 0 Complex Wound Assessment / Reassessment - multiple wounds '[]'$  - 0 Dermatologic / Skin Assessment (not related to wound area) ASSESSMENTS - Focused Assessment X- 1 5 Circumferential Edema Measurements - multi extremities '[]'$  - 0 Nutritional Assessment / Counseling / Intervention '[]'$  - 0 Lower Extremity Assessment (monofilament, tuning fork, pulses) '[]'$  - 0 Peripheral Arterial Disease Assessment (using hand held doppler) ASSESSMENTS - Ostomy and/or Continence Assessment and Care '[]'$  - 0 Incontinence Assessment and Management '[]'$  - 0 Ostomy Care Assessment and Management (repouching, etc.) PROCESS - Coordination of Care X - Simple Patient / Family Education for ongoing care 1 15 '[]'$  - 0 Complex (extensive) Patient / Family Education for ongoing care X- 1 10 Staff obtains Programmer, systems, Records, T Results / Process Orders est '[]'$  - 0 Staff telephones HHA, Nursing Homes / Clarify orders / etc '[]'$  - 0 Routine Transfer to another Facility (non-emergent condition) '[]'$  - 0 Routine Hospital Admission (non-emergent condition) '[]'$  - 0 New Admissions / Biomedical engineer / Ordering NPWT Apligraf, etc. , '[]'$  - 0 Emergency Hospital Admission (emergent condition) X- 1 10 Simple Discharge Coordination '[]'$  - 0 Complex (extensive) Discharge Coordination PROCESS - Special Needs '[]'$  - 0 Pediatric / Minor Patient Management '[]'$  - 0 Isolation Patient Management '[]'$  - 0 Hearing / Language / Visual special needs '[]'$  - 0 Assessment of Community assistance (transportation, D/C planning, etc.) '[]'$  -  0 Additional assistance / Altered mentation '[]'$  - 0 Support Surface(s) Assessment (bed, cushion, seat, etc.) INTERVENTIONS - Wound Cleansing / Measurement X - Simple Wound Cleansing - one wound 1  5 '[]'$  - 0 Complex Wound Cleansing - multiple wounds X- 1 5 Wound Imaging (photographs - any number of wounds) '[]'$  - 0 Wound Tracing (instead of photographs) X- 1 5 Simple Wound Measurement - one wound '[]'$  - 0 Complex Wound Measurement - multiple wounds INTERVENTIONS - Wound Dressings X - Small Wound Dressing one or multiple wounds 1 10 '[]'$  - 0 Medium Wound Dressing one or multiple wounds '[]'$  - 0 Large Wound Dressing one or multiple wounds '[]'$  - 0 Application of Medications - topical '[]'$  - 0 Application of Medications - injection INTERVENTIONS - Miscellaneous '[]'$  - 0 External ear exam '[]'$  - 0 Specimen Collection (cultures, biopsies, blood, body fluids, etc.) '[]'$  - 0 Specimen(s) / Culture(s) sent or taken to Lab for analysis '[]'$  - 0 Patient Transfer (multiple staff / Civil Service fast streamer / Similar devices) '[]'$  - 0 Simple Staple / Suture removal (25 or less) '[]'$  - 0 Complex Staple / Suture removal (26 or more) '[]'$  - 0 Hypo / Hyperglycemic Management (close monitor of Blood Glucose) '[]'$  - 0 Ankle / Brachial Index (ABI) - do not check if billed separately X- 1 5 Vital Signs Has the patient been seen at the hospital within the last three years: Yes Total Score: 90 Level Of Care: New/Established - Level 3 Electronic Signature(s) Signed: 01/11/2022 3:45:51 PM By: Rhae Hammock RN Entered By: Rhae Hammock on 01/11/2022 10:54:56 -------------------------------------------------------------------------------- Encounter Discharge Information Details Patient Name: Date of Service: [name], [name] 01/11/2022 10:00 A M Medical Record Number: [id number] Patient Account Number: [id number] Date of Birth/Sex: Treating RN: 04/12/1929 (88 y.o. [name], Lauren Primary Care Lovette Merta: Ronnie Doss  Other Clinician: Referring Nataleah Scioneaux: Treating Sailor Hevia/Extender: Marisa Severin Weeks in Treatment: 5 Encounter Discharge Information Items Discharge Condition: Stable Ambulatory Status: Wheelchair Discharge Destination: Home Transportation: Private Auto Accompanied By: sons Schedule Follow-up Appointment: Yes Clinical Summary of Care: Patient Declined Electronic Signature(s) Signed: 01/11/2022 3:45:51 PM By: Rhae Hammock RN Entered By: Rhae Hammock on 01/11/2022 10:55:43 -------------------------------------------------------------------------------- Lower Extremity Assessment Details Patient Name: Date of Service: [name], [name] 01/11/2022 10:00 A M Medical Record Number: [id number] Patient Account Number: [id number] Date of Birth/Sex: Treating RN: [birth year] (88 y.o. [name], Lauren Primary Care Grantham Hippert: Ronnie Doss Other Clinician: Referring Dontrell Stuck: Treating Cherica Heiden/Extender: Marisa Severin Weeks in Treatment: 5 Edema Assessment Assessed: [Left: Yes] [Right: No] Edema: [Left: Ye] [Right: s] Calf Left: Right: Point of Measurement: 27 cm From Medial Instep 35.5 cm Ankle Left: Right: Point of Measurement: 9 cm From Medial Instep 31.5 cm Vascular Assessment Pulses: Dorsalis Pedis Palpable: [Left:Yes] Posterior Tibial Palpable: [Left:Yes] Electronic Signature(s) Signed: 01/11/2022 3:45:51 PM By: Rhae Hammock RN Entered By: Rhae Hammock on 01/11/2022 10:14:46 -------------------------------------------------------------------------------- Multi Wound Chart Details Patient Name: Date of Service: [name], [name] 01/11/2022 10:00 A M Medical Record Number: [id number] Patient Account Number: [id number] Date of Birth/Sex: Treating RN: [birth year] (88 y.o. [name], Lauren Primary Care Nicodemus Denk: Ronnie Doss Other Clinician: Referring Kamaree Berkel: Treating Christabel Camire/Extender: Marisa Severin Weeks in Treatment: 5 Vital Signs Height(in): 68 Pulse(bpm): 74 Weight(lbs): 146 Blood Pressure(mmHg): 119/74 Body Mass Index(BMI): 22.2 Temperature(F): 98.7 Respiratory Rate(breaths/min): 17 Photos: [N/A:N/A] Left, Plantar T Great oe N/A N/A Wound Location: Trauma N/A N/A Wounding Event: Trauma, Other N/A N/A Primary Etiology: Arrhythmia, Hypertension, Neuropathy N/A N/A Comorbid History: 11/26/2021 N/A N/A Date Acquired: 5 N/A N/A Weeks of Treatment: Open N/A N/A Wound Status: No N/A N/A Wound Recurrence: 0.7x0.7x0.6 N/A N/A Measurements L x  W x D (cm) 0.385 N/A N/A A (cm) : rea 0.231 N/A N/A Volume (cm) : 50.50% N/A N/A % Reduction in A rea: -48.10% N/A N/A % Reduction in Volume: 12 Starting Position 1 (o'clock): 12 Ending Position 1 (o'clock): 0.7 Maximum Distance 1 (cm): Yes N/A N/A Undermining: Full Thickness With Exposed Support N/A N/A Classification: Structures Medium N/A N/A Exudate Amount: Serosanguineous N/A N/A Exudate Type: red, brown N/A N/A Exudate Color: Distinct, outline attached N/A N/A Wound Margin: Large (67-100%) N/A N/A Granulation Amount: Red, Pink, Hyper-granulation N/A N/A Granulation Quality: Small (1-33%) N/A N/A Necrotic Amount: Fat Layer (Subcutaneous Tissue): Yes N/A N/A Exposed Structures: Bone: Yes Fascia: No Tendon: No Muscle: No Joint: No Small (1-33%) N/A N/A Epithelialization: Treatment Notes Wound #1 (Toe Great) Wound Laterality: Plantar, Left Cleanser Vashe 5.8 (oz) Discharge Instruction: Cleanse the wound with Vashe prior to applying a clean dressing using gauze sponges, not tissue or cotton balls. Peri-Wound Care Topical Primary Dressing FIBRACOL Plus Dressing, 2x2 in (collagen) Discharge Instruction: Moisten collagen with BLASTX BlastX, 0.25 (oz), tube Secondary Dressing Optifoam Non-Adhesive Dressing, 4x4 in Discharge Instruction: foam donut for  offloading Woven Gauze Sponge, Non-Sterile 4x4 in Discharge Instruction: Apply over primary dressing as directed. Secured With Conforming Stretch Gauze Bandage, Sterile 2x75 (in/in) Discharge Instruction: Secure with stretch gauze as directed. 69M Medipore H Soft Cloth Surgical T ape, 4 x 10 (in/yd) Discharge Instruction: Secure with tape as directed. Compression Wrap Compression Stockings Add-Ons Cover-roll Stretch Fixation Dressing, 2x10 (in/yd) Electronic Signature(s) Signed: 01/11/2022 12:27:16 PM By: Kalman Shan DO Signed: 01/11/2022 3:45:51 PM By: Rhae Hammock RN Entered By: Kalman Shan on 01/11/2022 11:08:43 -------------------------------------------------------------------------------- Multi-Disciplinary Care Plan Details Patient Name: Date of Service: [name], [name] 01/11/2022 10:00 A M Medical Record Number: [id number] Patient Account Number: [id number] Date of Birth/Sex: Treating RN: [birth year] (88 y.o. [name], Lauren Primary Care Paislie Tessler: Ronnie Doss Other Clinician: Referring Alyas Creary: Treating Hideko Esselman/Extender: Marisa Severin Weeks in Treatment: 5 Active Inactive Wound/Skin Impairment Nursing Diagnoses: Impaired tissue integrity Knowledge deficit related to ulceration/compromised skin integrity Goals: Patient will have a decrease in wound volume by X% from date: (specify in notes) Date Initiated: 12/07/2021 Target Resolution Date: 01/16/2022 Goal Status: Active Patient/caregiver will verbalize understanding of skin care regimen Date Initiated: 12/07/2021 Target Resolution Date: 01/22/2022 Goal Status: Active Ulcer/skin breakdown will have a volume reduction of 30% by week 4 Date Initiated: 12/07/2021 Target Resolution Date: 01/23/2022 Goal Status: Active Interventions: Assess patient/caregiver ability to obtain necessary supplies Assess patient/caregiver ability to perform ulcer/skin care regimen upon  admission and as needed Assess ulceration(s) every visit Notes: Electronic Signature(s) Signed: 01/11/2022 3:45:51 PM By: Rhae Hammock RN Entered By: Rhae Hammock on 01/11/2022 10:05:53 -------------------------------------------------------------------------------- Pain Assessment Details Patient Name: Date of Service: [name], [name] 01/11/2022 10:00 A M Medical Record Number: [id number] Patient Account Number: [id number] Date of Birth/Sex: Treating RN: 1929-03-01 (88 y.o. [name], Lauren Primary Care Devera Englander: Ronnie Doss Other Clinician: Referring Arrionna Serena: Treating Sianne Tejada/Extender: Marisa Severin Weeks in Treatment: 5 Active Problems Location of Pain Severity and Description of Pain Patient Has Paino No Site Locations Pain Management and Medication Current Pain Management: Electronic Signature(s) Signed: 01/11/2022 3:45:51 PM By: Rhae Hammock RN Entered By: Rhae Hammock on 01/11/2022 10:13:25 -------------------------------------------------------------------------------- Patient/Caregiver Education Details Patient Name: Date of Service: [name], [name] 7/17/2023andnbsp10:00 A M Medical Record Number: [id number] Patient Account Number: [id number] Date of Birth/Gender: Treating RN: 23-May-1929 (88 y.o. [name] Primary Care Physician:  Ronnie Doss Other Clinician: Referring Physician: Treating Physician/Extender: Carrie Mew in Treatment: 5 Education Assessment Education Provided To: Patient Education Topics Provided Wound/Skin Impairment: Methods: Explain/Verbal Responses: State content correctly Electronic Signature(s) Signed: 01/11/2022 3:45:51 PM By: Rhae Hammock RN Entered By: Rhae Hammock on 01/11/2022 10:06:04 -------------------------------------------------------------------------------- Wound Assessment Details Patient Name: Date of  Service: [name], [name] 01/11/2022 10:00 A M Medical Record Number: [id number] Patient Account Number: [id number] Date of Birth/Sex: Treating RN: Feb 21, 1929 (88 y.o. [name], Lauren Primary Care Ronika Kelson: Ronnie Doss Other Clinician: Referring Saydie Gerdts: Treating Ndidi Nesby/Extender: Marisa Severin Weeks in Treatment: 5 Wound Status Wound Number: 1 Primary Etiology: Trauma, Other Wound Location: Left, Plantar T Great oe Wound Status: Open Wounding Event: Trauma Comorbid History: Arrhythmia, Hypertension, Neuropathy Date Acquired: 11/26/2021 Weeks Of Treatment: 5 Clustered Wound: No Photos Wound Measurements Length: (cm) 0.7 Width: (cm) 0.7 Depth: (cm) 0.6 Area: (cm) 0.385 Volume: (cm) 0.231 Wound Description Classification: Full Thickness With Exposed Support Structures Wound Margin: Distinct, outline attached Exudate Amount: Medium Exudate Type: Serosanguineous Exudate Color: red, brown Foul Odor After Cleansing: Slough/Fibrino % Reduction in Area: 50.5% % Reduction in Volume: -48.1% Epithelialization: Small (1-33%) Tunneling: No Undermining: Yes Starting Position (o'clock): 12 Ending Position (o'clock): 12 Maximum Distance: (cm) 0.7 No Yes Wound Bed Granulation Amount: Large (67-100%) Exposed Structure Granulation Quality: Red, Pink, Hyper-granulation Fascia Exposed: No Necrotic Amount: Small (1-33%) Fat Layer (Subcutaneous Tissue) Exposed: Yes Necrotic Quality: Adherent Slough Tendon Exposed: No Muscle Exposed: No Joint Exposed: No Bone Exposed: Yes Treatment Notes Wound #1 (Toe Great) Wound Laterality: Plantar, Left Cleanser Vashe 5.8 (oz) Discharge Instruction: Cleanse the wound with Vashe prior to applying a clean dressing using gauze sponges, not tissue or cotton balls. Peri-Wound Care Topical Primary Dressing FIBRACOL Plus Dressing, 2x2 in (collagen) Discharge Instruction: Moisten collagen with BLASTX BlastX,  0.25 (oz), tube Secondary Dressing Optifoam Non-Adhesive Dressing, 4x4 in Discharge Instruction: foam donut for offloading Woven Gauze Sponge, Non-Sterile 4x4 in Discharge Instruction: Apply over primary dressing as directed. Secured With Conforming Stretch Gauze Bandage, Sterile 2x75 (in/in) Discharge Instruction: Secure with stretch gauze as directed. 50M Medipore H Soft Cloth Surgical T ape, 4 x 10 (in/yd) Discharge Instruction: Secure with tape as directed. Compression Wrap Compression Stockings Add-Ons Cover-roll Stretch Fixation Dressing, 2x10 (in/yd) Electronic Signature(s) Signed: 01/11/2022 3:45:51 PM By: Rhae Hammock RN Entered By: Rhae Hammock on 01/11/2022 10:19:37 -------------------------------------------------------------------------------- Vitals Details Patient Name: Date of Service: [name], [name] 01/11/2022 10:00 A M Medical Record Number: [id number] Patient Account Number: [id number] Date of Birth/Sex: Treating RN: 03-29-1929 (88 y.o. [name], Lauren Primary Care Kratos Ruscitti: Ronnie Doss Other Clinician: Referring Oluwatosin Bracy: Treating Siarah Deleo/Extender: Marisa Severin Weeks in Treatment: 5 Vital Signs Time Taken: 10:13 Temperature (F): 98.7 Height (in): 68 Pulse (bpm): 74 Weight (lbs): 146 Respiratory Rate (breaths/min): 17 Body Mass Index (BMI): 22.2 Blood Pressure (mmHg): 119/74 Reference Range: 80 - 120 mg / dl Electronic Signature(s) Signed: 01/11/2022 3:45:51 PM By: Rhae Hammock RN Entered By: Rhae Hammock on 01/11/2022 10:13:20

## 2022-01-12 ENCOUNTER — Other Ambulatory Visit: Payer: Self-pay | Admitting: Internal Medicine

## 2022-01-12 ENCOUNTER — Other Ambulatory Visit (HOSPITAL_COMMUNITY): Payer: Self-pay | Admitting: Internal Medicine

## 2022-01-12 DIAGNOSIS — L97522 Non-pressure chronic ulcer of other part of left foot with fat layer exposed: Secondary | ICD-10-CM

## 2022-01-15 ENCOUNTER — Telehealth: Payer: Self-pay | Admitting: Family Medicine

## 2022-01-15 ENCOUNTER — Ambulatory Visit: Payer: MEDICARE | Admitting: Family Medicine

## 2022-01-15 NOTE — Telephone Encounter (Signed)
Margreta Journey called from [facility] stating that pt was under the impression that Dr Lajuana Ripple was sending order for her to get a Transport Chair instead of a wheelchair. Says Radiation protection practitioner are not covered by pts insurance so pt declined to get the wheelchair.

## 2022-01-15 NOTE — Telephone Encounter (Signed)
Will advice at pt appt in 30 minutes

## 2022-01-18 ENCOUNTER — Encounter: Payer: Self-pay | Admitting: Family Medicine

## 2022-01-18 ENCOUNTER — Encounter: Payer: Self-pay | Admitting: Cardiovascular Disease

## 2022-01-18 ENCOUNTER — Ambulatory Visit (INDEPENDENT_AMBULATORY_CARE_PROVIDER_SITE_OTHER): Payer: MEDICARE | Admitting: Cardiovascular Disease

## 2022-01-18 ENCOUNTER — Encounter (HOSPITAL_BASED_OUTPATIENT_CLINIC_OR_DEPARTMENT_OTHER): Payer: MEDICARE | Admitting: Internal Medicine

## 2022-01-18 VITALS — BP 124/60 | HR 69 | Ht 68.0 in | Wt 145.0 lb

## 2022-01-18 DIAGNOSIS — T8133XA Disruption of traumatic injury wound repair, initial encounter: Secondary | ICD-10-CM | POA: Diagnosis not present

## 2022-01-18 DIAGNOSIS — I495 Sick sinus syndrome: Secondary | ICD-10-CM | POA: Diagnosis not present

## 2022-01-18 DIAGNOSIS — I480 Paroxysmal atrial fibrillation: Secondary | ICD-10-CM | POA: Diagnosis not present

## 2022-01-18 DIAGNOSIS — I10 Essential (primary) hypertension: Secondary | ICD-10-CM | POA: Diagnosis not present

## 2022-01-18 DIAGNOSIS — G9009 Other idiopathic peripheral autonomic neuropathy: Secondary | ICD-10-CM

## 2022-01-18 DIAGNOSIS — I872 Venous insufficiency (chronic) (peripheral): Secondary | ICD-10-CM | POA: Diagnosis not present

## 2022-01-18 DIAGNOSIS — R600 Localized edema: Secondary | ICD-10-CM | POA: Diagnosis not present

## 2022-01-18 DIAGNOSIS — Z950 Presence of cardiac pacemaker: Secondary | ICD-10-CM | POA: Diagnosis not present

## 2022-01-18 DIAGNOSIS — D6869 Other thrombophilia: Secondary | ICD-10-CM

## 2022-01-18 DIAGNOSIS — Z9889 Other specified postprocedural states: Secondary | ICD-10-CM | POA: Diagnosis not present

## 2022-01-18 DIAGNOSIS — L97522 Non-pressure chronic ulcer of other part of left foot with fat layer exposed: Secondary | ICD-10-CM | POA: Diagnosis not present

## 2022-01-18 MED ORDER — FUROSEMIDE 20 MG PO TABS: 20.0000 mg | ORAL_TABLET | Freq: Every day | ORAL | 0 refills | Status: DC | PRN

## 2022-01-18 NOTE — Progress Notes (Signed)
Cardiology office note   Date:  01/20/2022   ID:  [name], DOB 01-15-1929, MRN [id number] PCP:  [name], DO  Cardiologist:  Jenavie Stanczak Electrophysiologist:  None   Evaluation Performed:  Follow-Up Visit  Chief Complaint:  Afib, pacemaker follow-up  History of Present Illness:    [name] is a 88 y.o. female with remote history of mitral valve annuloplasty and surgical ablation, sinus node dysfunction and paroxysmal atrial fibrillation with a dual-chamber permanent pacemaker (Medtronic 2008, original 5076 leads, Azure generator change out 2018).  Her personal health has not had any new issues during the last year, but she has lost several very close family members including her husband of almost 55 years who passed away several months ago.  She generally feels well although her stamina has diminished.  She denies shortness of breath at rest or with activity or any problems with chest pain.  She does not have palpitations and denies dizziness or syncope.  She has mild chronic bilateral calf swelling and varicose veins.  Knee-high compression stockings are rather uncomfortable or not really doing a good job.  Overall the degree of edema has not changed.  She has not had any falls, serious injuries or bleeding problems.  Her most recent hemoglobin was 12.2 and she has normal renal function.  Pacemaker interrogation does not show any significant changes.  As before the atrial lead parameters are suboptimal.  In the bipolar configuration, atrial sensed P waves are 0.1-0.3 mV and the atrial lead capture threshold is 1.5 V at 1.0 ms.  Unipolar sensing led to a lot of artifact from myopotentials.  The burden of atrial fibrillation is low at 1.8%, although we may be under reporting the true amount of atrial fibrillation via under sensing.  The last meaningful episode of sustained atrial fibrillation was a 3-day event that occurred in late April.  The most recent episode lasted  16-minute episode in July.  Otherwise she has 79% atrial pacing and only 1.7% ventricular pacing.  Estimated generator longevity is 2.0 years (the current device is Medtronic Azure implanted in 2018).   She has hypertension. She has a history of mitral valve annuloplasty and Cox Maze cryoablation procedure via minithoracotomy in 2009, has preserved left ventricular systolic function, no residual mitral insufficiency, normal coronary arteries at preoperative angiogram.   Past Medical History:  Diagnosis Date   A-fib ([place])    Dyslipidemia    Hyperlipidemia    Hypothyroidism    2 degree amidarone    Pacemaker    PAF (paroxysmal atrial fibrillation) (HCC)    S/P mitral valve repair    SSS (sick sinus syndrome) ([place])    Systemic hypertension    Past Surgical History:  Procedure Laterality Date   ABDOMINAL HYSTERECTOMY  1989   CARDIAC CATHETERIZATION  01/11/2008   mild-mod pulm. hypertension,normal coronaries   MINIMALLY INVASIVE MAZE PROCEDURE  02/2008   MITRAL VALVE REPAIR  September 2009   [facility] ring annuloplasty   NM MYOVIEW LTD  03/28/2007   no ischemia   PACEMAKER PLACEMENT  November 2008   PPM GENERATOR CHANGEOUT N/A 08/25/2016   Procedure: PPM Generator Changeout - Battery Only;  Surgeon: Sanda Klein, MD;  Location: [facility];  Service: Cardiovascular;  Laterality: N/A;   US ECHOCARDIOGRAPHY  03/07/2012   mild LVH,EF =>55%,mild MR,mod TR,trace AI,right atrium mod. dilated     Current Meds  Medication Sig   metoprolol tartrate (LOPRESSOR) 50 MG tablet TAKE 1  TABLET TWICE A DAY   Polyethyl Glycol-Propyl Glycol (SYSTANE OP) Apply 1 drop to eye daily as needed (dry eyes).   triamterene-hydrochlorothiazide (DYAZIDE) 37.5-25 MG capsule TAKE 1 CAPSULE DAILY   warfarin (COUMADIN) 2.5 MG tablet 1 tablet by mouth daily except 2 tablets on Fridays.     Allergies:   Statins   Social History   Tobacco Use   Smoking status: Never   Smokeless tobacco: Never   Vaping Use   Vaping Use: Never used  Substance Use Topics   Alcohol use: No   Drug use: Never     Family Hx: The patient's family history includes Heart attack in her father and mother; Parkinson's disease in her son.  ROS:   Please see the history of present illness.    All other systems reviewed and are negative.   Prior CV studies:   The following studies were reviewed today: Comprehensive pacemaker check today performed by me  Labs/Other Tests and Data Reviewed:    EKG: ECG performed today shows alternating atrial sensed (sinus) ventricular sensed and atrial paced ventricular sensed beats.  There is borderline left axis deviation but otherwise the tracing is normal.  QTc 420 ms.  Recent Labs: 11/11/2021: ALT 10; BUN 16; Creatinine, Ser 0.98; Hemoglobin 12.2; Platelets 240; Potassium 4.7; Sodium 140; TSH 2.490   Recent Lipid Panel Lab Results  Component Value Date/Time   CHOL 223 (H) 05/21/2016 09:54 AM   TRIG 120 05/21/2016 09:54 AM   HDL 52 05/21/2016 09:54 AM   CHOLHDL 4.3 05/21/2016 09:54 AM   CHOLHDL 4.8 12/26/2012 10:57 AM   LDLCALC 147 (H) 05/21/2016 09:54 AM    Wt Readings from Last 3 Encounters:  01/18/22 145 lb (65.8 kg)  01/08/22 148 lb 9.6 oz (67.4 kg)  12/23/21 149 lb (67.6 kg)     Objective:    Vital Signs:  BP 124/60 (BP Location: Left Arm, Patient Position: Sitting, Cuff Size: Normal)   Pulse 69   Ht '5\' 8"'$  (1.727 m)   Wt 145 lb (65.8 kg)   SpO2 97%   BMI 22.05 kg/m      General: Alert, oriented x3, no distress, very lean.  Healthy pacemaker site. Head: no evidence of trauma, PERRL, EOMI, no exophtalmos or lid lag, no myxedema, no xanthelasma; normal ears, nose and oropharynx Neck: normal jugular venous pulsations and no hepatojugular reflux; brisk carotid pulses without delay and no carotid bruits Chest: clear to auscultation, no signs of consolidation by percussion or palpation, normal fremitus, symmetrical and full respiratory  excursions Cardiovascular: normal position and quality of the apical impulse, regular rhythm, normal first and second heart sounds, no murmurs, rubs or gallops Abdomen: no tenderness or distention, no masses by palpation, no abnormal pulsatility or arterial bruits, normal bowel sounds, no hepatosplenomegaly Extremities: no clubbing, cyanosis or edema; 2+ radial, ulnar and brachial pulses bilaterally; 2+ right femoral, posterior tibial and dorsalis pedis pulses; 2+ left femoral, posterior tibial and dorsalis pedis pulses; no subclavian or femoral bruits Neurological: grossly nonfocal Psych: Normal mood and affect    ASSESSMENT & PLAN:    1. Paroxysmal atrial fibrillation (HCC)   2. Acquired thrombophilia ([place])   3. Tachy-brady syndrome ([place])   4. SSS (sick sinus syndrome) ([place])   5. Pacemaker   6. Essential hypertension   7. History of mitral valve repair   8. Localized edema   9. Peripheral venous insufficiency       AFib: Low burden of atrial fibrillation less than 2%,  but with some episodes that lasted longer, one of them for a few consecutive days., but she has had lengthy episodes for a few hours.  It is appropriate to continue anticoagulation. CHADSVasc 4 (age 65, HTN, gender).  Although she has a history of mitral valve repair she did not have rheumatic valvular heart disease.  Warfarin: Compliant with anticoagulation follow-up.  Denies bleeding problems.  She is monitored by her primary care provider in [place]. Tachy-bradycardia sd: The heart rate histogram distribution suggest that the current sensor settings are satisfactory. PM: Atrial lead parameters have deteriorated further, but are still manageable.  We will probably need to consider placing a new atrial lead when it comes time for generator change out (although the issue may not be only be the lead, but also decreased atrial voltages after her maze procedure). HTN: Well-controlled. Hx MV repair: Asymptomatic.  No symptoms  of heart failure.  No murmur on physical exam.  Has not had an echocardiogram since September 2013. Peripheral venous insufficiency: Recommend switching to thigh-high compression stockings.  Has a small supply of low-dose Lasix for when the edema is particularly severe, but this should be used sparingly.   Patient Instructions  Medication Instructions:  TAKE Furosemide 20 mg once daily as needed for leg swelling  *If you need a refill on your cardiac medications before your next appointment, please call your pharmacy*   Lab Work: None ordered If you have labs (blood work) drawn today and your tests are completely normal, you will receive your results only by: [place] (if you have MyChart) OR A paper copy in the mail If you have any lab test that is abnormal or we need to change your treatment, we will call you to review the results.   Testing/Procedures: None ordered   Follow-Up: At [facility], you and your health needs are our priority.  As part of our continuing mission to provide you with exceptional heart care, we have created designated Provider Care Teams.  These Care Teams include your primary Cardiologist (physician) and Advanced Practice Providers (APPs -  Physician Assistants and Nurse Practitioners) who all work together to provide you with the care you need, when you need it.  We recommend signing up for the patient portal called "MyChart".  Sign up information is provided on this After Visit Summary.  MyChart is used to connect with patients for Virtual Visits (Telemedicine).  Patients are able to view lab/test results, encounter notes, upcoming appointments, etc.  Non-urgent messages can be sent to your provider as well.   To learn more about what you can do with MyChart, go to [email or web address].    Your next appointment:   6 month(s)  The format for your next appointment:   In Person  Provider:   Dr. Sallyanne Kuster  Other Instructions Dr. Sallyanne Kuster  recommends [place]             -- [facility]             -- [address]             -- [phone]  -- [place]             -- [address] [place]             -- [place], Sanda Klein, MD  01/20/2022 3:33 PM  [facility]

## 2022-01-18 NOTE — Patient Instructions (Signed)
Medication Instructions:  TAKE Furosemide 20 mg once daily as needed for leg swelling  *If you need a refill on your cardiac medications before your next appointment, please call your pharmacy*   Lab Work: None ordered If you have labs (blood work) drawn today and your tests are completely normal, you will receive your results only by: [place] (if you have MyChart) OR A paper copy in the mail If you have any lab test that is abnormal or we need to change your treatment, we will call you to review the results.   Testing/Procedures: None ordered   Follow-Up: At [facility], you and your health needs are our priority.  As part of our continuing mission to provide you with exceptional heart care, we have created designated Provider Care Teams.  These Care Teams include your primary Cardiologist (physician) and Advanced Practice Providers (APPs -  Physician Assistants and Nurse Practitioners) who all work together to provide you with the care you need, when you need it.  We recommend signing up for the patient portal called "MyChart".  Sign up information is provided on this After Visit Summary.  MyChart is used to connect with patients for Virtual Visits (Telemedicine).  Patients are able to view lab/test results, encounter notes, upcoming appointments, etc.  Non-urgent messages can be sent to your provider as well.   To learn more about what you can do with MyChart, go to [email or web address].    Your next appointment:   6 month(s)  The format for your next appointment:   In Person  Provider:   Dr. Sallyanne Kuster  Other Instructions Dr. Sallyanne Kuster recommends [place]             -- [facility]             -- [address]             -- [phone]  -- [place]             -- [address] [place]             -- [place]

## 2022-01-25 ENCOUNTER — Ambulatory Visit (HOSPITAL_COMMUNITY)
Admission: RE | Admit: 2022-01-25 | Discharge: 2022-01-25 | Disposition: A | Discharge status: Home / Self Care | Payer: MEDICARE | Source: Ambulatory Visit | Attending: Internal Medicine | Admitting: Internal Medicine

## 2022-01-25 DIAGNOSIS — L97522 Non-pressure chronic ulcer of other part of left foot with fat layer exposed: Secondary | ICD-10-CM

## 2022-01-25 DIAGNOSIS — R600 Localized edema: Secondary | ICD-10-CM | POA: Diagnosis not present

## 2022-01-25 DIAGNOSIS — L97519 Non-pressure chronic ulcer of other part of right foot with unspecified severity: Secondary | ICD-10-CM | POA: Diagnosis not present

## 2022-01-25 DIAGNOSIS — Z9889 Other specified postprocedural states: Secondary | ICD-10-CM | POA: Diagnosis not present

## 2022-01-25 MED ORDER — IOHEXOL 300 MG/ML  SOLN
100.0000 mL | Freq: Once | INTRAMUSCULAR | Status: AC | PRN
  Administered 2022-01-25: 100 mL via INTRAVENOUS

## 2022-02-01 ENCOUNTER — Encounter (HOSPITAL_BASED_OUTPATIENT_CLINIC_OR_DEPARTMENT_OTHER): Payer: MEDICARE | Admitting: Internal Medicine

## 2022-02-01 NOTE — Progress Notes (Signed)
[name] ([id number]) Visit Report for 01/18/2022 Chief Complaint Document Details Patient Name: Date of Service: [name], [name] 01/18/2022 12:30 PM Medical Record Number: [id number] Patient Account Number: [id number] Date of Birth/Sex: Treating RN: 01-02-1929 (88 y.o. [name], [name] Primary Care Provider: Ronnie [name] Other Clinician: Referring Provider: Treating Provider/Extender: [name] in Treatment: 6 Information Obtained from: Patient Chief Complaint 12/07/2021; left plantar toe wound Electronic Signature(s) Signed: 01/18/2022 3:08:01 PM By: Melissa Shan DO Entered By: [name] on 01/18/2022 13:26:05 -------------------------------------------------------------------------------- HPI Details Patient Name: Date of Service: [name], [name] 01/18/2022 12:30 PM Medical Record Number: [id number] Patient Account Number: [id number] Date of Birth/Sex: Treating RN: 05-30-1929 (88 y.o. [name] Primary Care Provider: Ronnie [name] Other Clinician: Referring Provider: Treating Provider/Extender: [name] in Treatment: 6 History of Present Illness HPI Description: Admission 12/07/2021 Ms. [name] is a 88 year old female with a past medical history of chronic venous insufficiency, idiopathic peripheral neuropathy, paroxysmal A-fib on Coumadin that presents to the clinic for a 2 week history of nonhealing ulcer to the left plantar great toe. She had a callus to this area that she attempted to remove with scissors and subsequently developed a wound. She currently denies systemic signs of infection. She is keeping the area covered. She is currently on Keflex for left lower extremity cellulitis by her primary care office. 6/19; patient presents for follow-up. She obtained her blast X and collagen and has been using this daily. She has no issues or complaints today. She  denies signs of infection. 6/26; patient presents for follow-up. She has been using blast X and collagen without issues. She has no complaints today. She denies signs of infection. She has been using the offloading felt donut pad. 7/10; patient presents for follow-up. A PCR culture was done at last clinic visit that showed high levels of Enterococcus bacillus and Proteus mirabilis. I had recommended Keystone antibiotics however patient declines this. She has been using blast X and collagen without issues. She has no issues or complaints today. 7/17; patient presents for follow-up. She continues to use blast X and collagen. She completed her course of Levaquin without issues. 7/24; patient presents for follow-up. She has been using blast X and collagen to the wound bed. She is scheduled for her CT scan next week. She is currently wearing tight fitting shoes however states she is wearing her surgical shoe at home. Electronic Signature(s) Signed: 01/18/2022 3:08:01 PM By: Melissa Shan DO Entered By: [name] on 01/18/2022 13:27:05 -------------------------------------------------------------------------------- Physical Exam Details Patient Name: Date of Service: [name], [name] 01/18/2022 12:30 PM Medical Record Number: [id number] Patient Account Number: [id number] Date of Birth/Sex: Treating RN: 1928/08/17 (88 y.o. [name] Primary Care Provider: Ronnie [name] Other Clinician: Referring Provider: Treating Provider/Extender: [name] in Treatment: 6 Constitutional respirations regular, non-labored and within target range for patient.. Cardiovascular 2+ dorsalis pedis/posterior tibialis pulses. Psychiatric pleasant and cooperative. Notes Left lower extremity: T the plantar left great toe there is an open wound with granulation tissue. Undermining from the 11 to 2 o'clock position. No signs of o surrounding  infection. Electronic Signature(s) Signed: 01/18/2022 3:08:01 PM By: Melissa Shan DO Entered By: [name] on 01/18/2022 13:28:08 -------------------------------------------------------------------------------- Physician Orders Details Patient Name: Date of Service: [name], [name] 01/18/2022 12:30 PM Medical Record Number: [id number] Patient Account Number: [id number] Date of Birth/Sex: Treating RN: Jul 15, 1928 (88 y.o. [name] Primary  Care Provider: Ronnie [name] Other Clinician: Referring Provider: Treating Provider/Extender: [name] in Treatment: 6 Verbal / Phone Orders: No Diagnosis Coding ICD-10 Coding Code Description [phone] Non-pressure chronic ulcer of other part of left foot with fat layer exposed G90.09 Other idiopathic peripheral autonomic neuropathy T81.33XA Disruption of traumatic injury wound repair, initial encounter Follow-up Appointments ppointment in 2 [name]. - 02/01/22 @ 1:00pm w/ Dr. Heber [facility] and [facility] Room 9 Return A Bathing/ Shower/ Hygiene May shower with protection but do not get wound dressing(s) wet. Edema Control - Lymphedema / SCD / Other Elevate legs to the level of the heart or above for 30 minutes daily and/or when sitting, a frequency of: Avoid standing for long periods of time. Off-Loading Open toe surgical shoe to: - LEFT FOOT Wound Treatment Wound #1 - T Great oe Wound Laterality: Plantar, Left Cleanser: Vashe 5.8 (oz) (Generic) 1 x Per Day/15 Days Discharge Instructions: Cleanse the wound with Vashe prior to applying a clean dressing using gauze sponges, not tissue or cotton balls. Prim Dressing: FIBRACOL Plus Dressing, 2x2 in (collagen) 1 x Per Day/15 Days ary Discharge Instructions: Moisten collagen with BLASTX Prim Dressing: BlastX, 0.25 (oz), tube ary 1 x Per Day/15 Days Secondary Dressing: Optifoam Non-Adhesive Dressing, 4x4 in (Generic) 1 x Per Day/15 Days Discharge  Instructions: foam donut for offloading Secondary Dressing: Woven Gauze Sponge, Non-Sterile 4x4 in (Generic) 1 x Per Day/15 Days Discharge Instructions: Apply over primary dressing as directed. Secured With: Child psychotherapist, Sterile 2x75 (in/in) (Generic) 1 x Per Day/15 Days Discharge Instructions: Secure with stretch gauze as directed. Secured With: 52M Medipore H Soft Cloth Surgical T ape, 4 x 10 (in/yd) (Generic) 1 x Per Day/15 Days Discharge Instructions: Secure with tape as directed. Add-Ons: Cover-roll Stretch Fixation Dressing, 2x10 (in/yd) (Generic) 1 x Per Day/15 Days Electronic Signature(s) Signed: 01/18/2022 3:08:01 PM By: Melissa Shan DO Entered By: [name] on 01/18/2022 13:28:27 -------------------------------------------------------------------------------- Problem List Details Patient Name: Date of Service: [name], Aryani H. 01/18/2022 12:30 PM Medical Record Number: [id number] Patient Account Number: [id number] Date of Birth/Sex: Treating RN: 1928-11-11 (88 y.o. [name] Primary Care Provider: Ronnie [name] Other Clinician: Referring Provider: Treating Provider/Extender: [name] in Treatment: 6 Active Problems ICD-10 Encounter Code Description Active Date MDM Diagnosis [phone] Non-pressure chronic ulcer of other part of left foot with fat layer exposed 12/07/2021 No Yes G90.09 Other idiopathic peripheral autonomic neuropathy 12/07/2021 No Yes T81.33XA Disruption of traumatic injury wound repair, initial encounter 12/07/2021 No Yes Inactive Problems Resolved Problems Electronic Signature(s) Signed: 01/18/2022 3:08:01 PM By: Melissa Shan DO Entered By: [name] on 01/18/2022 13:25:52 -------------------------------------------------------------------------------- Progress Note Details Patient Name: Date of Service: [name], Sharonne H. 01/18/2022 12:30 PM Medical Record Number:  [id number] Patient Account Number: [id number] Date of Birth/Sex: Treating RN: 1928-12-17 (88 y.o. [name] Primary Care Provider: Ronnie [name] Other Clinician: Referring Provider: Treating Provider/Extender: [name] in Treatment: 6 Subjective Chief Complaint Information obtained from Patient 12/07/2021; left plantar toe wound History of Present Illness (HPI) Admission 12/07/2021 Ms. Adrea [name] is a 88 year old female with a past medical history of chronic venous insufficiency, idiopathic peripheral neuropathy, paroxysmal A-fib on Coumadin that presents to the clinic for a 2 week history of nonhealing ulcer to the left plantar great toe. She had a callus to this area that she attempted to remove with scissors and subsequently developed a wound. She currently denies systemic signs of infection. She  is keeping the area covered. She is currently on Keflex for left lower extremity cellulitis by her primary care office. 6/19; patient presents for follow-up. She obtained her blast X and collagen and has been using this daily. She has no issues or complaints today. She denies signs of infection. 6/26; patient presents for follow-up. She has been using blast X and collagen without issues. She has no complaints today. She denies signs of infection. She has been using the offloading felt donut pad. 7/10; patient presents for follow-up. A PCR culture was done at last clinic visit that showed high levels of Enterococcus bacillus and Proteus mirabilis. I had recommended Keystone antibiotics however patient declines this. She has been using blast X and collagen without issues. She has no issues or complaints today. 7/17; patient presents for follow-up. She continues to use blast X and collagen. She completed her course of Levaquin without issues. 7/24; patient presents for follow-up. She has been using blast X and collagen to the wound bed. She is scheduled  for her CT scan next week. She is currently wearing tight fitting shoes however states she is wearing her surgical shoe at home. Patient History Information obtained from Patient, Chart. Family History Unknown History. Social History Never smoker, Marital Status - Widowed, Alcohol Use - Never, Drug Use - No History, Caffeine Use - Rarely. Medical History Cardiovascular Patient has history of Arrhythmia - A Fibb, Hypertension Denies history of Angina, Congestive Heart Failure, Coronary Artery Disease, Deep Vein Thrombosis, Hypotension, Myocardial Infarction, Peripheral Arterial Disease, Peripheral Venous Disease, Phlebitis, Vasculitis Endocrine Denies history of Type I Diabetes, Type II Diabetes Neurologic Patient has history of Neuropathy Hospitalization/Surgery History - s/p mitral valve repair. - abd. hysterectomy. - cardiac cath.. - pacemaker placemen. Medical A Surgical History Notes nd Cardiovascular Pacemaker, post mitral valve repair, paroxysymal Afibb, sick sinus syndrome, hyperlipidemia, dysplipidemia Endocrine hyperthyroidism Objective Constitutional respirations regular, non-labored and within target range for patient.. Vitals Time Taken: 12:43 PM, Height: 68 in, Weight: 146 lbs, BMI: 22.2, Temperature: 98.4 F, Pulse: 67 bpm, Respiratory Rate: 18 breaths/min, Blood Pressure: 121/76 mmHg. Cardiovascular 2+ dorsalis pedis/posterior tibialis pulses. Psychiatric pleasant and cooperative. General Notes: Left lower extremity: T the plantar left great toe there is an open wound with granulation tissue. Undermining from the 11 to 2 o'clock position. o No signs of surrounding infection. Integumentary (Hair, Skin) Wound #1 status is Open. Original cause of wound was Trauma. The date acquired was: 11/26/2021. The wound has been in treatment 6 [name]. The wound is located on the [employer]. The wound measures 0.5cm length x 0.5cm width x 0.3cm depth; 0.196cm^2 area and  0.059cm^3 volume. There is bone oe and Fat Layer (Subcutaneous Tissue) exposed. There is no tunneling noted, however, there is undermining starting at 9:00 and ending at 3:00 with a maximum distance of 0.4cm. There is a medium amount of serosanguineous drainage noted. The wound margin is distinct with the outline attached to the wound base. There is large (67-100%) red, pink, pale granulation within the wound bed. There is a small (1-33%) amount of necrotic tissue within the wound bed including Adherent Slough. Assessment Active Problems ICD-10 Non-pressure chronic ulcer of other part of left foot with fat layer exposed Other idiopathic peripheral autonomic neuropathy Disruption of traumatic injury wound repair, initial encounter Patient's wound has shown improvement in size in appearance this week. I recommended using her surgical shoe with foam donut. She can continue blast X with collagen. Follow-up in 2 [name]. She is scheduled  for CT scan next week to assess for osteomyelitis. Plan Follow-up Appointments: Return Appointment in 2 [name]. - 02/01/22 @ 1:00pm w/ Dr. Heber [place] [facility] Room 9 Bathing/ Shower/ Hygiene: May shower with protection but do not get wound dressing(s) wet. Edema Control - Lymphedema / SCD / Other: Elevate legs to the level of the heart or above for 30 minutes daily and/or when sitting, a frequency of: Avoid standing for long periods of time. Off-Loading: Open toe surgical shoe to: - LEFT FOOT WOUND #1: - T Great Wound Laterality: Plantar, Left oe Cleanser: Vashe 5.8 (oz) (Generic) 1 x Per Day/15 Days Discharge Instructions: Cleanse the wound with Vashe prior to applying a clean dressing using gauze sponges, not tissue or cotton balls. Prim Dressing: FIBRACOL Plus Dressing, 2x2 in (collagen) 1 x Per Day/15 Days ary Discharge Instructions: Moisten collagen with BLASTX Prim Dressing: BlastX, 0.25 (oz), tube 1 x Per Day/15 Days ary Secondary Dressing: Optifoam  Non-Adhesive Dressing, 4x4 in (Generic) 1 x Per Day/15 Days Discharge Instructions: foam donut for offloading Secondary Dressing: Woven Gauze Sponge, Non-Sterile 4x4 in (Generic) 1 x Per Day/15 Days Discharge Instructions: Apply over primary dressing as directed. Secured With: Child psychotherapist, Sterile 2x75 (in/in) (Generic) 1 x Per Day/15 Days Discharge Instructions: Secure with stretch gauze as directed. Secured With: 42M Medipore H Soft Cloth Surgical T ape, 4 x 10 (in/yd) (Generic) 1 x Per Day/15 Days Discharge Instructions: Secure with tape as directed. Add-Ons: Cover-roll Stretch Fixation Dressing, 2x10 (in/yd) (Generic) 1 x Per Day/15 Days 1. Blast X and collagen 2. Aggressive offloadingoosurgical shoe with foam donut 3. Follow-up in 2 [name] Electronic Signature(s) Signed: 01/18/2022 3:08:01 PM By: Melissa Shan DO Entered By: [name] on 01/18/2022 13:29:39 -------------------------------------------------------------------------------- HxROS Details Patient Name: Date of Service: [name], Nikeia H. 01/18/2022 12:30 PM Medical Record Number: [id number] Patient Account Number: [id number] Date of Birth/Sex: Treating RN: [birth year] (88 y.o. [name], [name] Primary Care Provider: Ronnie [name] Other Clinician: Referring Provider: Treating Provider/Extender: [name] in Treatment: 6 Information Obtained From Patient Chart Cardiovascular Medical History: Positive for: Arrhythmia - A Fibb; Hypertension Negative for: Angina; Congestive Heart Failure; Coronary Artery Disease; Deep Vein Thrombosis; Hypotension; Myocardial Infarction; Peripheral Arterial Disease; Peripheral Venous Disease; Phlebitis; Vasculitis Past Medical History Notes: Pacemaker, post mitral valve repair, paroxysymal Afibb, sick sinus syndrome, hyperlipidemia, dysplipidemia Endocrine Medical History: Negative for: Type I Diabetes; Type II  Diabetes Past Medical History Notes: hyperthyroidism Neurologic Medical History: Positive for: Neuropathy Immunizations Pneumococcal Vaccine: Received Pneumococcal Vaccination: Yes Received Pneumococcal Vaccination On or After 60th Birthday: Yes Implantable Devices Yes Hospitalization / Surgery History Type of Hospitalization/Surgery s/p mitral valve repair abd. hysterectomy cardiac cath. pacemaker placemen Family and Social History Unknown History: Yes; Never smoker; Marital Status - Widowed; Alcohol Use: Never; Drug Use: No History; Caffeine Use: Rarely; Financial Concerns: No; Food, Clothing or Shelter Needs: No; Support System Lacking: No; Transportation Concerns: No Electronic Signature(s) Signed: 01/18/2022 3:08:01 PM By: Melissa Shan DO Signed: 02/01/2022 4:45:01 PM By: Rhae Hammock RN Entered By: [name] on 01/18/2022 13:27:16 -------------------------------------------------------------------------------- SuperBill Details Patient Name: Date of Service: [name], Daizha H. 01/18/2022 Medical Record Number: [id number] Patient Account Number: [id number] Date of Birth/Sex: Treating RN: Apr 09, 1929 (88 y.o. [name] Primary Care Provider: Ronnie [name] Other Clinician: Referring Provider: Treating Provider/Extender: [name] in Treatment: 6 Diagnosis Coding ICD-10 Codes Code Description [phone] Non-pressure chronic ulcer of other part of left foot with fat layer  exposed G90.09 Other idiopathic peripheral autonomic neuropathy T81.33XA Disruption of traumatic injury wound repair, initial encounter Facility Procedures CPT4 Code: [id number] Description: 99213 - WOUND CARE VISIT-LEV 3 EST PT Modifier: Quantity: 1 Physician Procedures : CPT4 Code Description Modifier [id number] - WC PHYS LEVEL 3 - EST PT ICD-10 Diagnosis Description L97.522 Non-pressure chronic ulcer of other part of left foot with fat layer  exposed G90.09 Other idiopathic peripheral autonomic neuropathy T81.33XA  Disruption of traumatic injury wound repair, initial encounter Quantity: 1 Electronic Signature(s) Signed: 01/18/2022 3:08:01 PM By: Melissa Shan DO Entered By: [name] on 01/18/2022 13:29:57

## 2022-02-01 NOTE — Progress Notes (Addendum)
MERLA, SAWKA ([id number]) Visit Report for 01/18/2022 Arrival Information Details Patient Name: Date of Service: [name], [name]. 01/18/2022 12:30 PM Medical Record Number: [id number] Patient Account Number: [id number] Date of Birth/Sex: Treating RN: 08/06/1928 (88 y.o. Sue Lush Primary Care Ndea Kilroy: Ronnie Doss Other Clinician: Referring Destiney Sanabia: Treating Nayomi Tabron/Extender: Marisa Severin Weeks in Treatment: 6 Visit Information History Since Last Visit Added or deleted any medications: No Patient Arrived: Wheel Chair Any new allergies or adverse reactions: No Arrival Time: 12:39 Had a fall or experienced change in No Accompanied By: son activities of daily living that may affect Transfer Assistance: Manual risk of falls: Patient Identification Verified: Yes Signs or symptoms of abuse/neglect since No Secondary Verification Process Completed: Yes last visito Patient Requires Transmission-Based Precautions: No Hospitalized since last visit: No Patient Has Alerts: Yes Implantable device outside of the clinic No Patient Alerts: Patient on Blood Thinner excluding cellular tissue based products placed in the center since last visit: Has Dressing in Place as Prescribed: Yes Has Footwear/Offloading in Place as No Prescribed: Left: Surgical Shoe with Pressure Relief Insole Pain Present Now: No Electronic Signature(s) Signed: 01/18/2022 4:30:49 PM By: Lorrin Jackson Entered By: Lorrin Jackson on 01/18/2022 12:43:05 -------------------------------------------------------------------------------- Clinic Level of Care Assessment Details Patient Name: Date of Service: [name], Zala H. 01/18/2022 12:30 PM Medical Record Number: [id number] Patient Account Number: [id number] Date of Birth/Sex: Treating RN: 12-05-1928 (88 y.o. Sue Lush Primary Care Jamika Sadek: Ronnie Doss Other Clinician: Referring Edward Guthmiller: Treating  Lisia Westbay/Extender: Marisa Severin Weeks in Treatment: 6 Clinic Level of Care Assessment Items TOOL 4 Quantity Score X- 1 0 Use when only an EandM is performed on FOLLOW-UP visit ASSESSMENTS - Nursing Assessment / Reassessment X- 1 10 Reassessment of Co-morbidities (includes updates in patient status) X- 1 5 Reassessment of Adherence to Treatment Plan ASSESSMENTS - Wound and Skin A ssessment / Reassessment X - Simple Wound Assessment / Reassessment - one wound 1 5 '[]'$  - 0 Complex Wound Assessment / Reassessment - multiple wounds '[]'$  - 0 Dermatologic / Skin Assessment (not related to wound area) ASSESSMENTS - Focused Assessment '[]'$  - 0 Circumferential Edema Measurements - multi extremities '[]'$  - 0 Nutritional Assessment / Counseling / Intervention '[]'$  - 0 Lower Extremity Assessment (monofilament, tuning fork, pulses) '[]'$  - 0 Peripheral Arterial Disease Assessment (using hand held doppler) ASSESSMENTS - Ostomy and/or Continence Assessment and Care '[]'$  - 0 Incontinence Assessment and Management '[]'$  - 0 Ostomy Care Assessment and Management (repouching, etc.) PROCESS - Coordination of Care '[]'$  - 0 Simple Patient / Family Education for ongoing care X- 1 20 Complex (extensive) Patient / Family Education for ongoing care X- 1 10 Staff obtains Programmer, systems, Records, T Results / Process Orders est '[]'$  - 0 Staff telephones HHA, Nursing Homes / Clarify orders / etc '[]'$  - 0 Routine Transfer to another Facility (non-emergent condition) '[]'$  - 0 Routine Hospital Admission (non-emergent condition) '[]'$  - 0 New Admissions / Biomedical engineer / Ordering NPWT Apligraf, etc. , '[]'$  - 0 Emergency Hospital Admission (emergent condition) '[]'$  - 0 Simple Discharge Coordination '[]'$  - 0 Complex (extensive) Discharge Coordination PROCESS - Special Needs '[]'$  - 0 Pediatric / Minor Patient Management '[]'$  - 0 Isolation Patient Management '[]'$  - 0 Hearing / Language / Visual special  needs '[]'$  - 0 Assessment of Community assistance (transportation, D/C planning, etc.) '[]'$  - 0 Additional assistance / Altered mentation '[]'$  - 0 Support Surface(s) Assessment (bed, cushion, seat, etc.) INTERVENTIONS - Wound Cleansing /  Measurement X - Simple Wound Cleansing - one wound 1 5 '[]'$  - 0 Complex Wound Cleansing - multiple wounds X- 1 5 Wound Imaging (photographs - any number of wounds) '[]'$  - 0 Wound Tracing (instead of photographs) X- 1 5 Simple Wound Measurement - one wound '[]'$  - 0 Complex Wound Measurement - multiple wounds INTERVENTIONS - Wound Dressings X - Small Wound Dressing one or multiple wounds 1 10 '[]'$  - 0 Medium Wound Dressing one or multiple wounds '[]'$  - 0 Large Wound Dressing one or multiple wounds '[]'$  - 0 Application of Medications - topical '[]'$  - 0 Application of Medications - injection INTERVENTIONS - Miscellaneous '[]'$  - 0 External ear exam '[]'$  - 0 Specimen Collection (cultures, biopsies, blood, body fluids, etc.) '[]'$  - 0 Specimen(s) / Culture(s) sent or taken to Lab for analysis '[]'$  - 0 Patient Transfer (multiple staff / Civil Service fast streamer / Similar devices) '[]'$  - 0 Simple Staple / Suture removal (25 or less) '[]'$  - 0 Complex Staple / Suture removal (26 or more) '[]'$  - 0 Hypo / Hyperglycemic Management (close monitor of Blood Glucose) '[]'$  - 0 Ankle / Brachial Index (ABI) - do not check if billed separately X- 1 5 Vital Signs Has the patient been seen at the hospital within the last three years: Yes Total Score: 80 Level Of Care: New/Established - Level 3 Electronic Signature(s) Signed: 01/18/2022 4:30:49 PM By: Lorrin Jackson Entered By: Lorrin Jackson on 01/18/2022 13:09:22 -------------------------------------------------------------------------------- Encounter Discharge Information Details Patient Name: Date of Service: [name], Adonica H. 01/18/2022 12:30 PM Medical Record Number: [id number] Patient Account Number: [id number] Date of Birth/Sex: Treating  RN: 1929/06/02 (88 y.o. Sue Lush Primary Care Huong Luthi: Ronnie Doss Other Clinician: Referring Jaedyn Lard: Treating Kiyara Bouffard/Extender: Marisa Severin Weeks in Treatment: 6 Encounter Discharge Information Items Discharge Condition: Stable Ambulatory Status: Wheelchair Discharge Destination: Home Transportation: Private Auto Accompanied By: son Schedule Follow-up Appointment: Yes Clinical Summary of Care: Provided on 01/18/2022 Form Type Recipient Paper Patient Patient Electronic Signature(s) Signed: 01/18/2022 4:30:49 PM By: Lorrin Jackson Entered By: Lorrin Jackson on 01/18/2022 13:10:08 -------------------------------------------------------------------------------- Lower Extremity Assessment Details Patient Name: Date of Service: [name], Taeko H. 01/18/2022 12:30 PM Medical Record Number: [id number] Patient Account Number: [id number] Date of Birth/Sex: Treating RN: 10-Jul-1928 (88 y.o. Sue Lush Primary Care Mionna Advincula: Ronnie Doss Other Clinician: Referring Aristeo Hankerson: Treating Mirka Barbone/Extender: Marisa Severin Weeks in Treatment: 6 Edema Assessment Assessed: [Left: Yes] [Right: No] Edema: [Left: Ye] [Right: s] Calf Left: Right: Point of Measurement: 27 cm From Medial Instep 37 cm Ankle Left: Right: Point of Measurement: 9 cm From Medial Instep 32 cm Knee To Floor Left: Right: From Medial Instep 42 cm Vascular Assessment Pulses: Dorsalis Pedis Palpable: [Left:Yes] Electronic Signature(s) Signed: 01/18/2022 4:30:49 PM By: Lorrin Jackson Entered By: Lorrin Jackson on 01/18/2022 12:58:10 -------------------------------------------------------------------------------- Multi Wound Chart Details Patient Name: Date of Service: [name], Vonzella H. 01/18/2022 12:30 PM Medical Record Number: [id number] Patient Account Number: [id number] Date of Birth/Sex: Treating RN: 02-07-1929 (88 y.o. Tonita Phoenix,  Lauren Primary Care Cherene Dobbins: Ronnie Doss Other Clinician: Referring Lenox Ladouceur: Treating Ferrell Flam/Extender: Marisa Severin Weeks in Treatment: 6 Vital Signs Height(in): 68 Pulse(bpm): 13 Weight(lbs): 146 Blood Pressure(mmHg): 121/76 Body Mass Index(BMI): 22.2 Temperature(F): 98.4 Respiratory Rate(breaths/min): 18 Photos: [N/A:N/A] Left, Plantar T Great oe N/A N/A Wound Location: Trauma N/A N/A Wounding Event: Trauma, Other N/A N/A Primary Etiology: Arrhythmia, Hypertension, Neuropathy N/A N/A Comorbid History: 11/26/2021 N/A N/A Date Acquired: 6 N/A N/A Weeks of  Treatment: Open N/A N/A Wound Status: No N/A N/A Wound Recurrence: 0.5x0.5x0.3 N/A N/A Measurements L x W x D (cm) 0.196 N/A N/A A (cm) : rea 0.059 N/A N/A Volume (cm) : 74.80% N/A N/A % Reduction in A rea: 62.20% N/A N/A % Reduction in Volume: 9 Starting Position 1 (o'clock): 3 Ending Position 1 (o'clock): 0.4 Maximum Distance 1 (cm): Yes N/A N/A Undermining: Full Thickness With Exposed Support N/A N/A Classification: Structures Medium N/A N/A Exudate Amount: Serosanguineous N/A N/A Exudate Type: red, brown N/A N/A Exudate Color: Distinct, outline attached N/A N/A Wound Margin: Large (67-100%) N/A N/A Granulation Amount: Red, Pink, Pale N/A N/A Granulation Quality: Small (1-33%) N/A N/A Necrotic Amount: Fat Layer (Subcutaneous Tissue): Yes N/A N/A Exposed Structures: Bone: Yes Fascia: No Tendon: No Muscle: No Joint: No Medium (34-66%) N/A N/A Epithelialization: Treatment Notes Wound #1 (Toe Great) Wound Laterality: Plantar, Left Cleanser Vashe 5.8 (oz) Discharge Instruction: Cleanse the wound with Vashe prior to applying a clean dressing using gauze sponges, not tissue or cotton balls. Peri-Wound Care Topical Primary Dressing FIBRACOL Plus Dressing, 2x2 in (collagen) Discharge Instruction: Moisten collagen with BLASTX BlastX, 0.25 (oz),  tube Secondary Dressing Optifoam Non-Adhesive Dressing, 4x4 in Discharge Instruction: foam donut for offloading Woven Gauze Sponge, Non-Sterile 4x4 in Discharge Instruction: Apply over primary dressing as directed. Secured With Conforming Stretch Gauze Bandage, Sterile 2x75 (in/in) Discharge Instruction: Secure with stretch gauze as directed. 31M Medipore H Soft Cloth Surgical T ape, 4 x 10 (in/yd) Discharge Instruction: Secure with tape as directed. Compression Wrap Compression Stockings Add-Ons Cover-roll Stretch Fixation Dressing, 2x10 (in/yd) Electronic Signature(s) Signed: 01/18/2022 3:08:01 PM By: Kalman Shan DO Signed: 02/01/2022 4:45:01 PM By: Rhae Hammock RN Entered By: Kalman Shan on 01/18/2022 13:25:57 -------------------------------------------------------------------------------- Multi-Disciplinary Care Plan Details Patient Name: Date of Service: [name], Maurisa H. 01/18/2022 12:30 PM Medical Record Number: [id number] Patient Account Number: [id number] Date of Birth/Sex: Treating RN: 1929/04/10 (88 y.o. Sue Lush Primary Care Edras Wilford: Ronnie Doss Other Clinician: Referring Dhillon Comunale: Treating Rylei Codispoti/Extender: Marisa Severin Weeks in Treatment: 6 Active Inactive Electronic Signature(s) Signed: 02/15/2022 5:31:13 PM By: Lorrin Jackson Signed: 03/26/2022 11:53:23 AM By: Rhae Hammock RN Previous Signature: 01/18/2022 4:30:49 PM Version By: Lorrin Jackson Entered By: Rhae Hammock on 02/15/2022 15:03:20 -------------------------------------------------------------------------------- Pain Assessment Details Patient Name: Date of Service: [name], Kenslee H. 01/18/2022 12:30 PM Medical Record Number: [id number] Patient Account Number: [id number] Date of Birth/Sex: Treating RN: 1929-01-23 (88 y.o. Sue Lush Primary Care Shevy Yaney: Ronnie Doss Other Clinician: Referring Miakoda Mcmillion: Treating  Elman Dettman/Extender: Marisa Severin Weeks in Treatment: 6 Active Problems Location of Pain Severity and Description of Pain Patient Has Paino No Site Locations Pain Management and Medication Current Pain Management: Electronic Signature(s) Signed: 01/18/2022 4:30:49 PM By: Lorrin Jackson Entered By: Lorrin Jackson on 01/18/2022 12:44:38 -------------------------------------------------------------------------------- Patient/Caregiver Education Details Patient Name: Date of Service: [name], Marvelyn H. 7/24/2023andnbsp12:30 PM Medical Record Number: [id number] Patient Account Number: [id number] Date of Birth/Gender: Treating RN: [birth year] (88 y.o. Sue Lush Primary Care Physician: Ronnie Doss Other Clinician: Referring Physician: Treating Physician/Extender: Carrie Mew in Treatment: 6 Education Assessment Education Provided To: Patient Education Topics Provided Offloading: Methods: Explain/Verbal, Printed Responses: State content correctly Wound/Skin Impairment: Methods: Explain/Verbal, Printed Responses: State content correctly Electronic Signature(s) Signed: 01/18/2022 4:30:49 PM By: Lorrin Jackson Entered By: Lorrin Jackson on 01/18/2022 12:39:36 -------------------------------------------------------------------------------- Wound Assessment Details Patient Name: Date of Service: [name], Louie H. 01/18/2022 12:30 PM Medical Record  Number: [id number] Patient Account Number: [id number] Date of Birth/Sex: Treating RN: Apr 07, 1929 (88 y.o. Sue Lush Primary Care Elease Swarm: Ronnie Doss Other Clinician: Referring Mercer Peifer: Treating Chaise Mahabir/Extender: Marisa Severin Weeks in Treatment: 6 Wound Status Wound Number: 1 Primary Etiology: Trauma, Other Wound Location: Left, Plantar T Great oe Wound Status: Open Wounding Event: Trauma Comorbid History: Arrhythmia,  Hypertension, Neuropathy Date Acquired: 11/26/2021 Weeks Of Treatment: 6 Clustered Wound: No Photos Wound Measurements Length: (cm) 0.5 Width: (cm) 0.5 Depth: (cm) 0.3 Area: (cm) 0.196 Volume: (cm) 0.059 % Reduction in Area: 74.8% % Reduction in Volume: 62.2% Epithelialization: Medium (34-66%) Tunneling: No Undermining: Yes Starting Position (o'clock): 9 Ending Position (o'clock): 3 Maximum Distance: (cm) 0.4 Wound Description Classification: Full Thickness With Exposed Support Structures Wound Margin: Distinct, outline attached Exudate Amount: Medium Exudate Type: Serosanguineous Exudate Color: red, brown Wound Bed Granulation Amount: Large (67-100%) Granulation Quality: Red, Pink, Pale Necrotic Amount: Small (1-33%) Necrotic Quality: Adherent Slough Foul Odor After Cleansing: No Slough/Fibrino Yes Exposed Structure Fascia Exposed: No Fat Layer (Subcutaneous Tissue) Exposed: Yes Tendon Exposed: No Muscle Exposed: No Joint Exposed: No Bone Exposed: Yes Electronic Signature(s) Signed: 01/18/2022 4:30:49 PM By: Lorrin Jackson Entered By: Lorrin Jackson on 01/18/2022 12:54:49 -------------------------------------------------------------------------------- Vitals Details Patient Name: Date of Service: [name], Kiyona H. 01/18/2022 12:30 PM Medical Record Number: [id number] Patient Account Number: [id number] Date of Birth/Sex: Treating RN: 01-16-1929 (88 y.o. Sue Lush Primary Care Latrece Nitta: Ronnie Doss Other Clinician: Referring Dekker Verga: Treating Manar Smalling/Extender: Marisa Severin Weeks in Treatment: 6 Vital Signs Time Taken: 12:43 Temperature (F): 98.4 Height (in): 68 Pulse (bpm): 67 Weight (lbs): 146 Respiratory Rate (breaths/min): 18 Body Mass Index (BMI): 22.2 Blood Pressure (mmHg): 121/76 Reference Range: 80 - 120 mg / dl Electronic Signature(s) Signed: 01/18/2022 4:30:49 PM By: Lorrin Jackson Entered By: Lorrin Jackson on 01/18/2022 12:44:31

## 2022-02-05 ENCOUNTER — Encounter: Payer: Self-pay | Admitting: Family Medicine

## 2022-02-05 ENCOUNTER — Ambulatory Visit (INDEPENDENT_AMBULATORY_CARE_PROVIDER_SITE_OTHER): Payer: MEDICARE | Admitting: Family Medicine

## 2022-02-05 VITALS — BP 119/66 | HR 63 | Temp 97.8°F | Ht 68.0 in | Wt 145.0 lb

## 2022-02-05 DIAGNOSIS — I480 Paroxysmal atrial fibrillation: Secondary | ICD-10-CM | POA: Diagnosis not present

## 2022-02-05 DIAGNOSIS — Z7901 Long term (current) use of anticoagulants: Secondary | ICD-10-CM

## 2022-02-05 DIAGNOSIS — F4321 Adjustment disorder with depressed mood: Secondary | ICD-10-CM | POA: Diagnosis not present

## 2022-02-05 LAB — COAGUCHEK XS/INR WAIVED
INR: 2.2 — ABNORMAL HIGH (ref 0.9–1.1)
Prothrombin Time: 26.9 s

## 2022-02-05 NOTE — Progress Notes (Signed)
Subjective: CC: INR check PCP: [name], DO BHA:LPFXT [name] is a 88 y.o. female presenting to clinic today for:  1.  Atrial fibrillation Saw cardiology on 01/18/2022.  She notes that since her last visit her brother-in-law passed.  She is sad about this that she continues to suffer multiple losses of family members including her husband earlier this year.  She is compliant with her medications and reports no rectal bleeding or heart palpitations.  She did get a wheelchair from her brother-in-law who passed.   ROS: Per HPI  Allergies  Allergen Reactions   Statins Other (See Comments)    Myalgia   Past Medical History:  Diagnosis Date   A-fib ([place])    Dyslipidemia    Hyperlipidemia    Hypothyroidism    2 degree amidarone    Pacemaker    PAF (paroxysmal atrial fibrillation) (HCC)    S/P mitral valve repair    SSS (sick sinus syndrome) (HCC)    Systemic hypertension     Current Outpatient Medications:    furosemide (LASIX) 20 MG tablet, Take 1 tablet (20 mg total) by mouth daily as needed for edema., Disp: 10 tablet, Rfl: 0   levofloxacin (LEVAQUIN) 750 MG tablet, Take 750 mg by mouth daily., Disp: , Rfl:    levothyroxine (SYNTHROID) 25 MCG tablet, TAKE 1 TABLET DAILY, Disp: 90 tablet, Rfl: 3   metoprolol tartrate (LOPRESSOR) 50 MG tablet, TAKE 1 TABLET TWICE A DAY, Disp: 180 tablet, Rfl: 3   Polyethyl Glycol-Propyl Glycol (SYSTANE OP), Apply 1 drop to eye daily as needed (dry eyes)., Disp: , Rfl:    triamterene-hydrochlorothiazide (DYAZIDE) 37.5-25 MG capsule, TAKE 1 CAPSULE DAILY, Disp: 90 capsule, Rfl: 3   warfarin (COUMADIN) 2.5 MG tablet, 1 tablet by mouth daily except 2 tablets on Fridays., Disp: 90 tablet, Rfl: 3 Social History   Socioeconomic History   Marital status: Widowed    Spouse name: Not on file   Number of children: 3   Years of education: Not on file   Highest education level: High school graduate  Occupational History   Occupation:  Retired  Tobacco Use   Smoking status: Never   Smokeless tobacco: Never  Vaping Use   Vaping Use: Never used  Substance and Sexual Activity   Alcohol use: No   Drug use: Never   Sexual activity: Not Currently  Other Topics Concern   Not on file  Social History Narrative   Husband passed 06/29/2021   2 sons live with her   Social Determinants of Health   Financial Resource Strain: Low Risk  (09/14/2021)   Overall Financial Resource Strain (CARDIA)    Difficulty of Paying Living Expenses: Not hard at all  Food Insecurity: No Food Insecurity (09/14/2021)   Hunger Vital Sign    Worried About Running Out of Food in the Last Year: Never true    [place] in the Last Year: Never true  Transportation Needs: No Transportation Needs (09/14/2021)   PRAPARE - Hydrologist (Medical): No    Lack of Transportation (Non-Medical): No  Physical Activity: Insufficiently Active (09/14/2021)   Exercise Vital Sign    Days of Exercise per Week: 7 days    Minutes of Exercise per Session: 10 min  Stress: No Stress Concern Present (09/14/2021)   [place]    Feeling of Stress : Only a little  Social Connections: Socially Isolated (  09/14/2021)   Social Connection and Isolation Panel [NHANES]    Frequency of Communication with Friends and Family: More than three times a week    Frequency of Social Gatherings with Friends and Family: More than three times a week    Attends Religious Services: Never    Marine scientist or Organizations: No    Attends Archivist Meetings: Never    Marital Status: Widowed  Intimate Partner Violence: Not At Risk (09/14/2021)   Humiliation, Afraid, Rape, and Kick questionnaire    Fear of Current or Ex-Partner: No    Emotionally Abused: No    Physically Abused: No    Sexually Abused: No   Family History  Problem Relation Age of Onset   Heart attack Mother     Heart attack Father    Parkinson's disease Son     Objective: Office vital signs reviewed. BP 119/66   Pulse 63   Temp 97.8 F (36.6 C)   Ht '5\' 8"'$  (1.727 m)   Wt 145 lb (65.8 kg)   SpO2 99%   BMI 22.05 kg/m   Physical Examination:  General: Awake, alert, nontoxic elderly female that appears sad HEENT: Sclera white.  No conjunctival pallor Cardio: regular rate and rhythm, S1S2 heard, no murmurs appreciated Pulm: clear to auscultation bilaterally, no wheezes, rhonchi or rales; normal work of breathing on room air MSK: Arrives in wheelchair  Assessment/ Plan: 88 y.o. female   Anticoagulation monitoring, INR range 2-3 - Plan: CoaguChek XS/INR Waived  Paroxysmal atrial fibrillation (HCC)  Grief  INR is therapeutic at 2.2 today.  No changes.  May follow-up in 8 weeks, sooner if concerns arise  Unfortunately continues to suffer loss with the loss of her brother-in-law in the last several days.  She is clearly sad on exam today but no interventions planned as she declined meds etc.  Orders Placed This Encounter  Procedures   CoaguChek XS/INR Waived   No orders of the defined types were placed in this encounter.    [name], DO [place] [phone]

## 2022-02-12 ENCOUNTER — Other Ambulatory Visit: Payer: Self-pay | Admitting: Cardiovascular Disease

## 2022-03-02 ENCOUNTER — Ambulatory Visit (INDEPENDENT_AMBULATORY_CARE_PROVIDER_SITE_OTHER): Payer: MEDICARE

## 2022-03-02 DIAGNOSIS — I480 Paroxysmal atrial fibrillation: Secondary | ICD-10-CM

## 2022-03-03 LAB — CUP PACEART REMOTE DEVICE CHECK
Battery Remaining Longevity: 22 mo
Battery Voltage: 2.9 V
Brady Statistic AP VP Percent: 0.27 %
Brady Statistic AP VS Percent: 85.55 %
Brady Statistic AS VP Percent: 0.02 %
Brady Statistic AS VS Percent: 14.16 %
Brady Statistic RA Percent Paced: 86.57 %
Brady Statistic RV Percent Paced: 0.29 %
Date Time Interrogation Session: 20230906021042
Implantable Lead Implant Date: 20081126
Implantable Lead Implant Date: 20081126
Implantable Lead Location: 753859
Implantable Lead Location: 753860
Implantable Lead Model: 5076
Implantable Lead Model: 5076
Implantable Pulse Generator Implant Date: 20180228
Lead Channel Impedance Value: 323 Ohm
Lead Channel Impedance Value: 342 Ohm
Lead Channel Impedance Value: 361 Ohm
Lead Channel Impedance Value: 380 Ohm
Lead Channel Pacing Threshold Amplitude: 1 V
Lead Channel Pacing Threshold Pulse Width: 0.4 ms
Lead Channel Sensing Intrinsic Amplitude: 0.125 mV
Lead Channel Sensing Intrinsic Amplitude: 0.125 mV
Lead Channel Sensing Intrinsic Amplitude: 8 mV
Lead Channel Sensing Intrinsic Amplitude: 8 mV
Lead Channel Setting Pacing Amplitude: 2.75 V
Lead Channel Setting Pacing Amplitude: 3.5 V
Lead Channel Setting Pacing Pulse Width: 0.4 ms
Lead Channel Setting Sensing Sensitivity: 2 mV

## 2022-03-24 NOTE — Progress Notes (Signed)
Remote pacemaker transmission.   

## 2022-04-07 ENCOUNTER — Encounter: Payer: Self-pay | Admitting: Family Medicine

## 2022-04-07 ENCOUNTER — Ambulatory Visit (INDEPENDENT_AMBULATORY_CARE_PROVIDER_SITE_OTHER): Payer: MEDICARE | Admitting: Family Medicine

## 2022-04-07 ENCOUNTER — Other Ambulatory Visit: Payer: Self-pay | Admitting: Family Medicine

## 2022-04-07 VITALS — BP 120/71 | HR 69 | Temp 98.6°F | Ht 68.0 in | Wt 145.0 lb

## 2022-04-07 DIAGNOSIS — Z7901 Long term (current) use of anticoagulants: Secondary | ICD-10-CM

## 2022-04-07 DIAGNOSIS — I480 Paroxysmal atrial fibrillation: Secondary | ICD-10-CM

## 2022-04-07 LAB — COAGUCHEK XS/INR WAIVED
INR: 2.3 — ABNORMAL HIGH (ref 0.9–1.1)
Prothrombin Time: 28.1 s

## 2022-04-07 NOTE — Progress Notes (Signed)
Subjective: CC: Anticoagulation monitoring PCP: [name], DO [name] is a 88 y.o. female presenting to clinic today for:  1.  A-fib INR goal 2-3 Patient is compliant with her Coumadin.  She denies any rectal bleeding, vaginal bleeding, hematuria or epistaxis.   ROS: Per HPI  Allergies  Allergen Reactions   Statins Other (See Comments)    Myalgia   Past Medical History:  Diagnosis Date   A-fib ([place])    Dyslipidemia    Hyperlipidemia    Hypothyroidism    2 degree amidarone    Pacemaker    PAF (paroxysmal atrial fibrillation) (HCC)    S/P mitral valve repair    SSS (sick sinus syndrome) (HCC)    Systemic hypertension     Current Outpatient Medications:    furosemide (LASIX) 20 MG tablet, Take 1 tablet (20 mg total) by mouth daily as needed for edema., Disp: 10 tablet, Rfl: 0   levothyroxine (SYNTHROID) 25 MCG tablet, TAKE 1 TABLET DAILY, Disp: 90 tablet, Rfl: 3   metoprolol tartrate (LOPRESSOR) 50 MG tablet, TAKE 1 TABLET TWICE A DAY, Disp: 180 tablet, Rfl: 3   Polyethyl Glycol-Propyl Glycol (SYSTANE OP), Apply 1 drop to eye daily as needed (dry eyes)., Disp: , Rfl:    triamterene-hydrochlorothiazide (DYAZIDE) 37.5-25 MG capsule, TAKE 1 CAPSULE DAILY, Disp: 90 capsule, Rfl: 3   warfarin (COUMADIN) 2.5 MG tablet, TAKE 1 TABLET DAILY EXCEPT TAKE 2 TABLETS ON FRIDAYS, Disp: 90 tablet, Rfl: 0 Social History   Socioeconomic History   Marital status: Widowed    Spouse name: Not on file   Number of children: 3   Years of education: Not on file   Highest education level: High school graduate  Occupational History   Occupation: Retired  Tobacco Use   Smoking status: Never   Smokeless tobacco: Never  Vaping Use   Vaping Use: Never used  Substance and Sexual Activity   Alcohol use: No   Drug use: Never   Sexual activity: Not Currently  Other Topics Concern   Not on file  Social History Narrative   Husband passed 06/29/2021   2 sons live with her    Social Determinants of Health   Financial Resource Strain: Low Risk  (09/14/2021)   Overall Financial Resource Strain (CARDIA)    Difficulty of Paying Living Expenses: Not hard at all  Food Insecurity: No Food Insecurity (09/14/2021)   Hunger Vital Sign    Worried About Running Out of Food in the Last Year: Never true    [place] in the Last Year: Never true  Transportation Needs: No Transportation Needs (09/14/2021)   PRAPARE - Hydrologist (Medical): No    Lack of Transportation (Non-Medical): No  Physical Activity: Insufficiently Active (09/14/2021)   Exercise Vital Sign    Days of Exercise per Week: 7 days    Minutes of Exercise per Session: 10 min  Stress: No Stress Concern Present (09/14/2021)   [place]    Feeling of Stress : Only a little  Social Connections: Socially Isolated (09/14/2021)   Social Connection and Isolation Panel [NHANES]    Frequency of Communication with Friends and Family: More than three times a week    Frequency of Social Gatherings with Friends and Family: More than three times a week    Attends Religious Services: Never    Marine scientist or Organizations: No  Attends Archivist Meetings: Never    Marital Status: Widowed  Intimate Partner Violence: Not At Risk (09/14/2021)   Humiliation, Afraid, Rape, and Kick questionnaire    Fear of Current or Ex-Partner: No    Emotionally Abused: No    Physically Abused: No    Sexually Abused: No   Family History  Problem Relation Age of Onset   Heart attack Mother    Heart attack Father    Parkinson's disease Son     Objective: Office vital signs reviewed. BP 120/71   Pulse 69   Temp 98.6 F (37 C)   Ht '5\' 8"'$  (1.727 m)   Wt 145 lb (65.8 kg)   SpO2 100%   BMI 22.05 kg/m   Physical Examination:  General: Awake, alert, well nourished, No acute distress HEENT: sclera white,  MMM Cardio: regular rate and rhythm, S1S2 heard, no murmurs appreciated Pulm: clear to auscultation bilaterally, no wheezes, rhonchi or rales; normal work of breathing on room air MSK: arrives in wheelchair   Assessment/ Plan: 88 y.o. female   Paroxysmal atrial fibrillation (HCC)  Anticoagulation monitoring, INR range 2-3 - Plan: CoaguChek XS/INR Waived  Rate and rhythm controlled.  INR therapeutic today at 2.3.  No changes.  Continue current regimen and follow-up in 6 to 8 weeks.  Plan for thyroid and hemoglobin check at next visit  Orders Placed This Encounter  Procedures   CoaguChek XS/INR Waived   No orders of the defined types were placed in this encounter.    [name], DO [place] [phone]

## 2022-06-01 ENCOUNTER — Ambulatory Visit (INDEPENDENT_AMBULATORY_CARE_PROVIDER_SITE_OTHER): Payer: MEDICARE

## 2022-06-01 DIAGNOSIS — I495 Sick sinus syndrome: Secondary | ICD-10-CM | POA: Diagnosis not present

## 2022-06-03 LAB — CUP PACEART REMOTE DEVICE CHECK
Battery Remaining Longevity: 20 mo
Battery Voltage: 2.88 V
Brady Statistic AP VP Percent: 0.36 %
Brady Statistic AP VS Percent: 85.87 %
Brady Statistic AS VP Percent: 0.02 %
Brady Statistic AS VS Percent: 13.75 %
Brady Statistic RA Percent Paced: 87.21 %
Brady Statistic RV Percent Paced: 0.38 %
Date Time Interrogation Session: 20231207110435
Implantable Lead Connection Status: 753985
Implantable Lead Connection Status: 753985
Implantable Lead Implant Date: 20081126
Implantable Lead Implant Date: 20081126
Implantable Lead Location: 753859
Implantable Lead Location: 753860
Implantable Lead Model: 5076
Implantable Lead Model: 5076
Implantable Pulse Generator Implant Date: 20180228
Lead Channel Impedance Value: 304 Ohm
Lead Channel Impedance Value: 342 Ohm
Lead Channel Impedance Value: 342 Ohm
Lead Channel Impedance Value: 399 Ohm
Lead Channel Pacing Threshold Amplitude: 1.375 V
Lead Channel Pacing Threshold Pulse Width: 0.4 ms
Lead Channel Sensing Intrinsic Amplitude: 0.125 mV
Lead Channel Sensing Intrinsic Amplitude: 0.125 mV
Lead Channel Sensing Intrinsic Amplitude: 6.875 mV
Lead Channel Sensing Intrinsic Amplitude: 6.875 mV
Lead Channel Setting Pacing Amplitude: 3.5 V
Lead Channel Setting Pacing Amplitude: 3.5 V
Lead Channel Setting Pacing Pulse Width: 0.4 ms
Lead Channel Setting Sensing Sensitivity: 2 mV
Zone Setting Status: 755011
Zone Setting Status: 755011

## 2022-06-15 ENCOUNTER — Encounter: Payer: Self-pay | Admitting: Family Medicine

## 2022-06-15 ENCOUNTER — Ambulatory Visit (INDEPENDENT_AMBULATORY_CARE_PROVIDER_SITE_OTHER): Payer: MEDICARE | Admitting: Family Medicine

## 2022-06-15 VITALS — BP 106/58 | HR 68 | Temp 98.2°F | Ht 68.0 in | Wt 144.0 lb

## 2022-06-15 DIAGNOSIS — N1831 Chronic kidney disease, stage 3a: Secondary | ICD-10-CM

## 2022-06-15 DIAGNOSIS — Z7901 Long term (current) use of anticoagulants: Secondary | ICD-10-CM | POA: Diagnosis not present

## 2022-06-15 DIAGNOSIS — R791 Abnormal coagulation profile: Secondary | ICD-10-CM | POA: Diagnosis not present

## 2022-06-15 DIAGNOSIS — E039 Hypothyroidism, unspecified: Secondary | ICD-10-CM | POA: Diagnosis not present

## 2022-06-15 LAB — COAGUCHEK XS/INR WAIVED
INR: 2.4 — ABNORMAL HIGH (ref 0.9–1.1)
Prothrombin Time: 29 s

## 2022-06-15 NOTE — Progress Notes (Signed)
Subjective: CC:INR check PCP: Melissa Norlander, DO QJJ:HERDE H Hollick is a 88 y.o. female presenting to clinic today for:  1. Afib Goal INR 2-3. She denies any bleeding.  Doing well and remembering to take her medicine as directed.  2. Hypothyroidism/ [id number] Compliant with thyroid replacement.  No reports of tremor, heart palpitations.  She has chronic lower extremity edema for which she takes triamterene-hydrochlorothiazide.  Not really taking Lasix lately.  Wears a compression hose  ROS: Per HPI  Allergies  Allergen Reactions   Statins Other (See Comments)    Myalgia   Past Medical History:  Diagnosis Date   A-fib ([place])    Dyslipidemia    Hyperlipidemia    Hypothyroidism    2 degree amidarone    Pacemaker    PAF (paroxysmal atrial fibrillation) (HCC)    S/P mitral valve repair    SSS (sick sinus syndrome) (HCC)    Systemic hypertension     Current Outpatient Medications:    furosemide (LASIX) 20 MG tablet, Take 1 tablet (20 mg total) by mouth daily as needed for edema., Disp: 10 tablet, Rfl: 0   levothyroxine (SYNTHROID) 25 MCG tablet, TAKE 1 TABLET DAILY, Disp: 90 tablet, Rfl: 3   metoprolol tartrate (LOPRESSOR) 50 MG tablet, TAKE 1 TABLET TWICE A DAY, Disp: 180 tablet, Rfl: 3   Polyethyl Glycol-Propyl Glycol (SYSTANE OP), Apply 1 drop to eye daily as needed (dry eyes)., Disp: , Rfl:    triamterene-hydrochlorothiazide (DYAZIDE) 37.5-25 MG capsule, TAKE 1 CAPSULE DAILY, Disp: 90 capsule, Rfl: 3   warfarin (COUMADIN) 2.5 MG tablet, TAKE 1 TABLET DAILY EXCEPT TAKE 2 TABLETS ON FRIDAYS, Disp: 90 tablet, Rfl: 0 Social History   Socioeconomic History   Marital status: Widowed    Spouse name: Not on file   Number of children: 3   Years of education: Not on file   Highest education level: High school graduate  Occupational History   Occupation: Retired  Tobacco Use   Smoking status: Never   Smokeless tobacco: Never  Vaping Use   Vaping Use: Never used   Substance and Sexual Activity   Alcohol use: No   Drug use: Never   Sexual activity: Not Currently  Other Topics Concern   Not on file  Social History Narrative   Husband passed 06/29/2021   2 sons live with her   Social Determinants of Health   Financial Resource Strain: Low Risk  (09/14/2021)   Overall Financial Resource Strain (CARDIA)    Difficulty of Paying Living Expenses: Not hard at all  Food Insecurity: No Food Insecurity (09/14/2021)   Hunger Vital Sign    Worried About Running Out of Food in the Last Year: Never true    [place] in the Last Year: Never true  Transportation Needs: No Transportation Needs (09/14/2021)   PRAPARE - Hydrologist (Medical): No    Lack of Transportation (Non-Medical): No  Physical Activity: Insufficiently Active (09/14/2021)   Exercise Vital Sign    Days of Exercise per Week: 7 days    Minutes of Exercise per Session: 10 min  Stress: No Stress Concern Present (09/14/2021)   [place]    Feeling of Stress : Only a little  Social Connections: Socially Isolated (09/14/2021)   Social Connection and Isolation Panel [NHANES]    Frequency of Communication with Friends and Family: More than three times a week  Frequency of Social Gatherings with Friends and Family: More than three times a week    Attends Religious Services: Never    Marine scientist or Organizations: No    Attends Archivist Meetings: Never    Marital Status: Widowed  Intimate Partner Violence: Not At Risk (09/14/2021)   Humiliation, Afraid, Rape, and Kick questionnaire    Fear of Current or Ex-Partner: No    Emotionally Abused: No    Physically Abused: No    Sexually Abused: No   Family History  Problem Relation Age of Onset   Heart attack Mother    Heart attack Father    Parkinson's disease Son     Objective: Office vital signs reviewed. BP (!) 106/58    Pulse 68   Temp 98.2 F (36.8 C)   Ht '5\' 8"'$  (1.727 m)   Wt 144 lb (65.3 kg)   SpO2 99%   BMI 21.90 kg/m   Physical Examination:  General: Awake, alert, well-appearing elderly female in no acute distress  HEENT: sclera white, MMM.  No exophthalmos.  No goiter Cardio: regular rate and rhythm, S1S2 heard, no murmurs appreciated Pulm: clear to auscultation bilaterally, no wheezes, rhonchi or rales; normal work of breathing on room air Extremities: warm, well perfused, 1-2+ pitting edema to mid shins bilaterally MSK: Arrives in wheelchair  Assessment/ Plan: 88 y.o. female   Anticoagulation monitoring, INR range 2-3 - Plan: CoaguChek XS/INR Waived  Stage 3a chronic kidney disease ([place]) - Plan: CBC with Differential/Platelet, Renal function panel  Acquired hypothyroidism - Plan: TSH, T4, free  INR therapeutic at 2.4.  No changes.  May follow-up in 6 to 8 weeks  Check renal function, CBC.  Check thyroid levels.  Asymptomatic from a thyroid standpoint   Orders Placed This Encounter  Procedures   CBC with Differential/Platelet   TSH   T4, free   Renal function panel   CoaguChek XS/INR Waived   No orders of the defined types were placed in this encounter.    Melissa Norlander, DO [place] [phone]

## 2022-06-16 LAB — RENAL FUNCTION PANEL
Albumin: 4.1 g/dL (ref 3.6–4.6)
BUN/Creatinine Ratio: 26 (ref 12–28)
BUN: 29 mg/dL (ref 10–36)
CO2: 26 mmol/L (ref 20–29)
Calcium: 9.6 mg/dL (ref 8.7–10.3)
Chloride: 101 mmol/L (ref 96–106)
Creatinine, Ser: 1.11 mg/dL — ABNORMAL HIGH (ref 0.57–1.00)
Glucose: 158 mg/dL — ABNORMAL HIGH (ref 70–99)
Phosphorus: 3 mg/dL (ref 3.0–4.3)
Potassium: 3.6 mmol/L (ref 3.5–5.2)
Sodium: 141 mmol/L (ref 134–144)
eGFR: 47 mL/min/{1.73_m2} — ABNORMAL LOW (ref >59)

## 2022-06-16 LAB — CBC WITH DIFFERENTIAL/PLATELET
Basophils Absolute: 0 10*3/uL (ref 0.0–0.2)
Basos: 1 %
EOS (ABSOLUTE): 0 10*3/uL (ref 0.0–0.4)
Eos: 0 %
Hematocrit: 37.2 % (ref 34.0–46.6)
Hemoglobin: 12.1 g/dL (ref 11.1–15.9)
Immature Grans (Abs): 0 10*3/uL (ref 0.0–0.1)
Immature Granulocytes: 0 %
Lymphocytes Absolute: 1.8 10*3/uL (ref 0.7–3.1)
Lymphs: 35 %
MCH: 29.9 pg (ref 26.6–33.0)
MCHC: 32.5 g/dL (ref 31.5–35.7)
MCV: 92 fL (ref 79–97)
Monocytes Absolute: 0.3 10*3/uL (ref 0.1–0.9)
Monocytes: 7 %
Neutrophils Absolute: 2.9 10*3/uL (ref 1.4–7.0)
Neutrophils: 57 %
Platelets: 218 10*3/uL (ref 150–450)
RBC: 4.05 x10E6/uL (ref 3.77–5.28)
RDW: 13.6 % (ref 11.7–15.4)
WBC: 5.1 10*3/uL (ref 3.4–10.8)

## 2022-06-16 LAB — TSH: TSH: 1.62 u[IU]/mL (ref 0.450–4.500)

## 2022-06-16 LAB — T4, FREE: Free T4: 1.05 ng/dL (ref 0.82–1.77)

## 2022-06-25 ENCOUNTER — Other Ambulatory Visit: Payer: Self-pay | Admitting: Family Medicine

## 2022-06-30 NOTE — Progress Notes (Signed)
Remote pacemaker transmission.   

## 2022-08-13 ENCOUNTER — Encounter: Payer: Self-pay | Admitting: Family Medicine

## 2022-08-13 ENCOUNTER — Ambulatory Visit (INDEPENDENT_AMBULATORY_CARE_PROVIDER_SITE_OTHER): Payer: MEDICARE | Admitting: Family Medicine

## 2022-08-13 VITALS — BP 133/82 | HR 89 | Temp 98.8°F | Ht 68.0 in | Wt 145.0 lb

## 2022-08-13 DIAGNOSIS — Z7901 Long term (current) use of anticoagulants: Secondary | ICD-10-CM

## 2022-08-13 DIAGNOSIS — R791 Abnormal coagulation profile: Secondary | ICD-10-CM

## 2022-08-13 DIAGNOSIS — I480 Paroxysmal atrial fibrillation: Secondary | ICD-10-CM | POA: Diagnosis not present

## 2022-08-13 LAB — COAGUCHEK XS/INR WAIVED
INR: 1.8 — ABNORMAL HIGH (ref 0.9–1.1)
Prothrombin Time: 22 s

## 2022-08-13 LAB — POCT INR: INR: 1.8 — AB (ref 2–3)

## 2022-08-13 NOTE — Progress Notes (Signed)
Subjective: CC:INR check PCP: [name], DO [id number] [name] is a 89 y.o. female presenting to clinic today for:  1.  Atrial fibrillation INR goal 2-3 Patient is compliant with 1 tablet daily of her Coumadin.  No bleeding episodes.  Denies any heart palpitations, shortness of breath.  Has chronic lower extremity edema.  This is unchanged from baseline.   ROS: Per HPI  Allergies  Allergen Reactions   Statins Other (See Comments)    Myalgia   Past Medical History:  Diagnosis Date   A-fib ([place])    Dyslipidemia    Hyperlipidemia    Hypothyroidism    2 degree amidarone    Pacemaker    PAF (paroxysmal atrial fibrillation) (HCC)    S/P mitral valve repair    SSS (sick sinus syndrome) (HCC)    Systemic hypertension     Current Outpatient Medications:    furosemide (LASIX) 20 MG tablet, Take 1 tablet (20 mg total) by mouth daily as needed for edema., Disp: 10 tablet, Rfl: 0   levothyroxine (SYNTHROID) 25 MCG tablet, TAKE 1 TABLET DAILY, Disp: 90 tablet, Rfl: 3   metoprolol tartrate (LOPRESSOR) 50 MG tablet, TAKE 1 TABLET TWICE A DAY, Disp: 180 tablet, Rfl: 3   Polyethyl Glycol-Propyl Glycol (SYSTANE OP), Apply 1 drop to eye daily as needed (dry eyes)., Disp: , Rfl:    triamterene-hydrochlorothiazide (DYAZIDE) 37.5-25 MG capsule, TAKE 1 CAPSULE DAILY, Disp: 90 capsule, Rfl: 3   warfarin (COUMADIN) 2.5 MG tablet, TAKE 1 TABLET DAILY EXCEPT TAKE 2 TABLETS ON FRIDAYS, Disp: 90 tablet, Rfl: 0 Social History   Socioeconomic History   Marital status: Widowed    Spouse name: Not on file   Number of children: 3   Years of education: Not on file   Highest education level: High school graduate  Occupational History   Occupation: Retired  Tobacco Use   Smoking status: Never   Smokeless tobacco: Never  Vaping Use   Vaping Use: Never used  Substance and Sexual Activity   Alcohol use: No   Drug use: Never   Sexual activity: Not Currently  Other Topics Concern   Not  on file  Social History Narrative   Husband passed 06/29/2021   2 sons live with her   Social Determinants of Health   Financial Resource Strain: Low Risk  (09/14/2021)   Overall Financial Resource Strain (CARDIA)    Difficulty of Paying Living Expenses: Not hard at all  Food Insecurity: No Food Insecurity (09/14/2021)   Hunger Vital Sign    Worried About Running Out of Food in the Last Year: Never true    [place] in the Last Year: Never true  Transportation Needs: No Transportation Needs (09/14/2021)   PRAPARE - Hydrologist (Medical): No    Lack of Transportation (Non-Medical): No  Physical Activity: Insufficiently Active (09/14/2021)   Exercise Vital Sign    Days of Exercise per Week: 7 days    Minutes of Exercise per Session: 10 min  Stress: No Stress Concern Present (09/14/2021)   [place]    Feeling of Stress : Only a little  Social Connections: Socially Isolated (09/14/2021)   Social Connection and Isolation Panel [NHANES]    Frequency of Communication with Friends and Family: More than three times a week    Frequency of Social Gatherings with Friends and Family: More than three times a week  Attends Religious Services: Never    Active Member of Clubs or Organizations: No    Attends Archivist Meetings: Never    Marital Status: Widowed  Intimate Partner Violence: Not At Risk (09/14/2021)   Humiliation, Afraid, Rape, and Kick questionnaire    Fear of Current or Ex-Partner: No    Emotionally Abused: No    Physically Abused: No    Sexually Abused: No   Family History  Problem Relation Age of Onset   Heart attack Mother    Heart attack Father    Parkinson's disease Son     Objective: Office vital signs reviewed. BP 133/82   Pulse 89   Temp 98.8 F (37.1 C)   Ht 5' 8"$  (1.727 m)   Wt 145 lb (65.8 kg)   SpO2 98%   BMI 22.05 kg/m   Physical Examination:   General: Awake, alert, well nourished, elderly female. No acute distress HEENT: sclera white, MMM Cardio: regular rate and rhythm Pulm: normal work of breathing on room air MSK: arrives in wheelchair  Assessment/ Plan: 89 y.o. female   Subtherapeutic international normalized ratio (INR)  Anticoagulation monitoring, INR range 2-3 - Plan: CoaguChek XS/INR Waived, POCT INR  Paroxysmal atrial fibrillation (HCC) - Plan: CoaguChek XS/INR Waived, POCT INR  INR subtherapeutic.  Increase to 1.5 tablets today and then resume 1 tablet daily all other.  Recheck in 7 to 14 days.  Appointment scheduled prior to discharge today  Orders Placed This Encounter  Procedures   CoaguChek XS/INR Waived   No orders of the defined types were placed in this encounter.    [name], DO [place] [phone]

## 2022-08-30 ENCOUNTER — Encounter: Payer: Self-pay | Admitting: Family Medicine

## 2022-08-30 ENCOUNTER — Ambulatory Visit (INDEPENDENT_AMBULATORY_CARE_PROVIDER_SITE_OTHER): Payer: MEDICARE | Admitting: Family Medicine

## 2022-08-30 VITALS — BP 135/72 | HR 63 | Ht 68.0 in | Wt 145.0 lb

## 2022-08-30 DIAGNOSIS — I480 Paroxysmal atrial fibrillation: Secondary | ICD-10-CM | POA: Diagnosis not present

## 2022-08-30 DIAGNOSIS — Z7901 Long term (current) use of anticoagulants: Secondary | ICD-10-CM | POA: Diagnosis not present

## 2022-08-30 LAB — POCT INR: INR: 1.9 — AB (ref 2–3)

## 2022-08-30 NOTE — Progress Notes (Signed)
Subjective: [id number] PCP: [name], DO [id number] H Mouse is a 89 y.o. female presenting to clinic today for:  1.  Atrial fibrillation Patient was last seen on 08/13/22 for INR check.  At that time her INR was subtherapeutic.  We advanced her to 1.5 tablets that day and then had her resume 1 tablet daily all other days.  She is here for 2-week follow-up.  She denies any bleeding.  No reports of heart palpitation.  Continues to have chronic lower extremity swelling that is unchanged   ROS: Per HPI  Allergies  Allergen Reactions   Statins Other (See Comments)    Myalgia   Past Medical History:  Diagnosis Date   A-fib ([place])    Dyslipidemia    Hyperlipidemia    Hypothyroidism    2 degree amidarone    Pacemaker    PAF (paroxysmal atrial fibrillation) (HCC)    S/P mitral valve repair    SSS (sick sinus syndrome) (HCC)    Systemic hypertension     Current Outpatient Medications:    furosemide (LASIX) 20 MG tablet, Take 1 tablet (20 mg total) by mouth daily as needed for edema., Disp: 10 tablet, Rfl: 0   levothyroxine (SYNTHROID) 25 MCG tablet, TAKE 1 TABLET DAILY, Disp: 90 tablet, Rfl: 3   metoprolol tartrate (LOPRESSOR) 50 MG tablet, TAKE 1 TABLET TWICE A DAY, Disp: 180 tablet, Rfl: 3   Polyethyl Glycol-Propyl Glycol (SYSTANE OP), Apply 1 drop to eye daily as needed (dry eyes)., Disp: , Rfl:    triamterene-hydrochlorothiazide (DYAZIDE) 37.5-25 MG capsule, TAKE 1 CAPSULE DAILY, Disp: 90 capsule, Rfl: 3   warfarin (COUMADIN) 2.5 MG tablet, TAKE 1 TABLET DAILY EXCEPT TAKE 2 TABLETS ON FRIDAYS, Disp: 90 tablet, Rfl: 0 Social History   Socioeconomic History   Marital status: Widowed    Spouse name: Not on file   Number of children: 3   Years of education: Not on file   Highest education level: High school graduate  Occupational History   Occupation: Retired  Tobacco Use   Smoking status: Never   Smokeless tobacco: Never  Vaping Use   Vaping Use: Never used   Substance and Sexual Activity   Alcohol use: No   Drug use: Never   Sexual activity: Not Currently  Other Topics Concern   Not on file  Social History Narrative   Husband passed 06/29/2021   2 sons live with her   Social Determinants of Health   Financial Resource Strain: Low Risk  (09/14/2021)   Overall Financial Resource Strain (CARDIA)    Difficulty of Paying Living Expenses: Not hard at all  Food Insecurity: No Food Insecurity (09/14/2021)   Hunger Vital Sign    Worried About Running Out of Food in the Last Year: Never true    [place] in the Last Year: Never true  Transportation Needs: No Transportation Needs (09/14/2021)   PRAPARE - Hydrologist (Medical): No    Lack of Transportation (Non-Medical): No  Physical Activity: Insufficiently Active (09/14/2021)   Exercise Vital Sign    Days of Exercise per Week: 7 days    Minutes of Exercise per Session: 10 min  Stress: No Stress Concern Present (09/14/2021)   [place]    Feeling of Stress : Only a little  Social Connections: Socially Isolated (09/14/2021)   Social Connection and Isolation Panel [NHANES]    Frequency of Communication with  Friends and Family: More than three times a week    Frequency of Social Gatherings with Friends and Family: More than three times a week    Attends Religious Services: Never    Marine scientist or Organizations: No    Attends Archivist Meetings: Never    Marital Status: Widowed  Intimate Partner Violence: Not At Risk (09/14/2021)   Humiliation, Afraid, Rape, and Kick questionnaire    Fear of Current or Ex-Partner: No    Emotionally Abused: No    Physically Abused: No    Sexually Abused: No   Family History  Problem Relation Age of Onset   Heart attack Mother    Heart attack Father    Parkinson's disease Son     Objective: Office vital signs reviewed. BP 135/72    Pulse 63   Ht '5\' 8"'$  (1.727 m)   Wt 145 lb (65.8 kg)   SpO2 94%   BMI 22.05 kg/m   Physical Examination:  General: Awake, alert, well-appearing elderly female, No acute distress HEENT: sclera white/no conjunctival pallor Cardio: regular rate and rhythm, S1S2 heard, no murmurs appreciated Pulm: clear to auscultation bilaterally, no wheezes, rhonchi or rales; normal work of breathing on room air  MSK: Arrives in wheelchair.  2+ pitting edema to bilateral shins compression hose in place  Assessment/ Plan: 89 y.o. female   Paroxysmal atrial fibrillation ([place]) - Plan: CoaguChek XS/INR Waived, POCT INR  Anticoagulation monitoring, INR range 2-3 - Plan: CoaguChek XS/INR Waived, POCT INR  INR subtherapeutic at 1.9 today.  I will advance her to 1.5 tablets Mondays and then continue 1 tablet daily all others.  Plan for follow-up in 2 weeks, sooner if concerns arise  Orders Placed This Encounter  Procedures   CoaguChek XS/INR Waived   No orders of the defined types were placed in this encounter.    [name], DO [place] [phone]

## 2022-08-31 LAB — COAGUCHEK XS/INR WAIVED
INR: 1.9 — ABNORMAL HIGH (ref 0.9–1.1)
Prothrombin Time: 23.4 s

## 2022-09-13 ENCOUNTER — Other Ambulatory Visit: Payer: Self-pay | Admitting: Family Medicine

## 2022-09-16 ENCOUNTER — Ambulatory Visit (INDEPENDENT_AMBULATORY_CARE_PROVIDER_SITE_OTHER): Payer: MEDICARE

## 2022-09-16 VITALS — Ht 68.0 in | Wt 145.0 lb

## 2022-09-16 DIAGNOSIS — Z0000 Encounter for general adult medical examination without abnormal findings: Secondary | ICD-10-CM | POA: Diagnosis not present

## 2022-09-16 NOTE — Progress Notes (Signed)
Subjective:   [name] is a 89 y.o. female who presents for Medicare Annual (Subsequent) preventive examination. I connected with  VALERI PETRICK on 09/16/22 by a audio enabled telemedicine application and verified that I am speaking with the correct person using two identifiers.  Patient Location: Home  Provider Location: Home Office  I discussed the limitations of evaluation and management by telemedicine. The patient expressed understanding and agreed to proceed.  Review of Systems     Cardiac Risk Factors include: advanced age (>25men, >73 women);hypertension;dyslipidemia     Objective:    Today's Vitals   09/16/22 1033  Weight: 145 lb (65.8 kg)  Height: 5\' 8"  (1.727 m)   Body mass index is 22.05 kg/m.     09/16/2022   10:36 AM 09/14/2021    1:20 PM 09/11/2020    2:26 PM 01/17/2019    8:43 AM 04/29/2018    1:32 PM 08/25/2016   12:46 PM  Advanced Directives  Does Patient Have a Medical Advance Directive? Yes No No No No No  Type of Paramedic of [place];Living will       Copy of [place] in Chart? No - copy requested       Would patient like information on creating a medical advance directive?  Yes (MAU/Ambulatory/Procedural Areas - Information given) No - Patient declined No - Patient declined  No - Patient declined    Current Medications (verified) Outpatient Encounter Medications as of 09/16/2022  Medication Sig   furosemide (LASIX) 20 MG tablet Take 1 tablet (20 mg total) by mouth daily as needed for edema.   levothyroxine (SYNTHROID) 25 MCG tablet TAKE 1 TABLET DAILY   metoprolol tartrate (LOPRESSOR) 50 MG tablet TAKE 1 TABLET TWICE A DAY   Polyethyl Glycol-Propyl Glycol (SYSTANE OP) Apply 1 drop to eye daily as needed (dry eyes).   triamterene-hydrochlorothiazide (DYAZIDE) 37.5-25 MG capsule TAKE 1 CAPSULE DAILY   warfarin (COUMADIN) 2.5 MG tablet TAKE 1 TABLET DAILY EXCEPT TAKE 2 TABLETS ON FRIDAYS   No  facility-administered encounter medications on file as of 09/16/2022.    Allergies (verified) Statins   History: Past Medical History:  Diagnosis Date   A-fib ([place])    Dyslipidemia    Hyperlipidemia    Hypothyroidism    2 degree [place]    Pacemaker    PAF (paroxysmal atrial fibrillation) (HCC)    S/P mitral valve repair    SSS (sick sinus syndrome) ([place])    Systemic hypertension    Past Surgical History:  Procedure Laterality Date   ABDOMINAL HYSTERECTOMY  1989   CARDIAC CATHETERIZATION  01/11/2008   mild-mod pulm. hypertension,normal coronaries   MINIMALLY INVASIVE MAZE PROCEDURE  02/2008   MITRAL VALVE REPAIR  September 2009   [facility] ring annuloplasty   NM MYOVIEW LTD  03/28/2007   no ischemia   PACEMAKER PLACEMENT  November 2008   PPM GENERATOR CHANGEOUT N/A 08/25/2016   Procedure: PPM Generator Changeout - Battery Only;  Surgeon: Sanda Klein, MD;  Location: [facility];  Service: Cardiovascular;  Laterality: N/A;   US ECHOCARDIOGRAPHY  03/07/2012   mild LVH,EF =>55%,mild MR,mod TR,trace AI,right atrium mod. dilated   Family History  Problem Relation Age of Onset   Heart attack Mother    Heart attack Father    Parkinson's disease Son    Social History   Socioeconomic History   Marital status: Widowed    Spouse name: Not on file  Number of children: 3   Years of education: Not on file   Highest education level: High school graduate  Occupational History   Occupation: Retired  Tobacco Use   Smoking status: Never   Smokeless tobacco: Never  Vaping Use   Vaping Use: Never used  Substance and Sexual Activity   Alcohol use: No   Drug use: Never   Sexual activity: Not Currently  Other Topics Concern   Not on file  Social History Narrative   Husband passed 06/29/2021   2 sons live with her   Social Determinants of Health   Financial Resource Strain: Low Risk  (09/16/2022)   Overall Financial Resource Strain (CARDIA)    Difficulty of  Paying Living Expenses: Not hard at all  Food Insecurity: No Food Insecurity (09/16/2022)   Hunger Vital Sign    Worried About Running Out of Food in the Last Year: Never true    [place] in the Last Year: Never true  Transportation Needs: No Transportation Needs (09/16/2022)   PRAPARE - Hydrologist (Medical): No    Lack of Transportation (Non-Medical): No  Physical Activity: Insufficiently Active (09/16/2022)   Exercise Vital Sign    Days of Exercise per Week: 3 days    Minutes of Exercise per Session: 30 min  Stress: No Stress Concern Present (09/16/2022)   [place]    Feeling of Stress : Not at all  Social Connections: Moderately Isolated (09/16/2022)   Social Connection and Isolation Panel [NHANES]    Frequency of Communication with Friends and Family: More than three times a week    Frequency of Social Gatherings with Friends and Family: More than three times a week    Attends Religious Services: More than 4 times per year    Active Member of [employer] or Organizations: No    Attends Archivist Meetings: Never    Marital Status: Widowed    Tobacco Counseling Counseling given: Not Answered   Clinical Intake:  Pre-visit preparation completed: Yes  Pain : No/denies pain     Nutritional Risks: None Diabetes: No  How often do you need to have someone help you when you read instructions, pamphlets, or other written materials from your doctor or pharmacy?: 1 - Never  Diabetic?no   Interpreter Needed?: No  Information entered by :: Jadene Pierini, LPN   Activities of Daily Living    09/16/2022   10:36 AM  In your present state of health, do you have any difficulty performing the following activities:  Hearing? 0  Vision? 0  Difficulty concentrating or making decisions? 0  Walking or climbing stairs? 0  Dressing or bathing? 0  Doing errands, shopping? 0   Preparing Food and eating ? N  Using the Toilet? N  In the past six months, have you accidently leaked urine? N  Do you have problems with loss of bowel control? N  Managing your Medications? N  Managing your Finances? N  Housekeeping or managing your Housekeeping? N    Patient Care Team: [name], DO as PCP - General (Family Medicine) Shea Evans Norva Riffle, LCSW as [place] (Licensed Clinical Social Worker) Croitoru, Dani Gobble, MD as Consulting Physician (Cardiology) Pixie Casino, MD as Consulting Physician (Cardiology)  Indicate any recent [place] you may have received from other than [facility] providers in the past year (date may be approximate).  Assessment:   This is a routine wellness examination for Reve.  Hearing/Vision screen Vision Screening - Comments:: Wears rx glasses - up to date with routine eye exams with  Dr.Lee   Dietary issues and exercise activities discussed: Current Exercise Habits: The patient does not participate in regular exercise at present, Exercise limited by: orthopedic condition(s)   Goals Addressed             This Visit's Progress    Have 3 meals a day   On track      Depression Screen    09/16/2022   10:35 AM 08/30/2022    4:17 PM 08/13/2022    2:17 PM 04/07/2022    1:40 PM 02/05/2022   11:10 AM 12/23/2021    1:28 PM 12/03/2021    2:37 PM  PHQ 2/9 Scores  PHQ - 2 Score 0 0 0 0 0 0 0  PHQ- 9 Score 0 0 0    0    Fall Risk    09/16/2022   10:34 AM 08/30/2022    4:17 PM 08/13/2022    2:17 PM 04/07/2022    1:40 PM 02/05/2022   11:10 AM  [place] in the past year? 1 0 0 0 0  Number falls in past yr: 1 0 0    Injury with Fall? 1 0 0    Risk for fall due to : History of fall(s);Impaired balance/gait;Orthopedic patient No Fall Risks No Fall Risks    Follow up Education provided;Falls prevention discussed Education provided Education provided      FALL RISK PREVENTION PERTAINING  TO THE HOME:  Any stairs in or around the home? Yes  If so, are there any without handrails? No  Home free of loose throw rugs in walkways, pet beds, electrical cords, etc? Yes  Adequate lighting in your home to reduce risk of falls? Yes   ASSISTIVE DEVICES UTILIZED TO PREVENT FALLS:  Life alert? No  Use of a cane, walker or w/c? Yes  Grab bars in the bathroom? Yes  Shower chair or bench in shower? Yes  Elevated toilet seat or a handicapped toilet? Yes          09/16/2022   10:36 AM 09/11/2020    1:27 PM 01/17/2019    8:44 AM  6CIT Screen  What Year? 0 points 0 points 0 points  What month? 0 points 0 points 0 points  What time? 0 points 0 points 0 points  Count back from 20 0 points 2 points 0 points  Months in reverse 0 points 0 points 0 points  Repeat phrase 2 points 0 points 0 points  Total Score 2 points 2 points 0 points    Immunizations Immunization History  Administered Date(s) Administered   Fluad Quad(high Dose 65+) 03/14/2019, 04/29/2020   Influenza, High Dose Seasonal PF 03/26/2016, 04/01/2017, 03/29/2018   Influenza,inj,Quad PF,6+ Mos 04/24/2013, 05/01/2014, 04/08/2015   Moderna Sars-Covid-2 Vaccination 09/11/2019, 10/08/2019   Pneumococcal Conjugate-13 04/29/2020    TDAP status: Due, Education has been provided regarding the importance of this vaccine. Advised may receive this vaccine at local pharmacy or Health Dept. Aware to provide a copy of the vaccination record if obtained from local pharmacy or Health Dept. Verbalized acceptance and understanding.  Flu Vaccine status: Declined, Education has been provided regarding the importance of this vaccine but patient still declined. Advised may receive this vaccine at local pharmacy or Health Dept. Aware to provide a copy of the vaccination  record if obtained from local pharmacy or Health Dept. Verbalized acceptance and understanding.  Pneumococcal vaccine status: Up to date  Covid-19 vaccine status: Completed  vaccines  Qualifies for Shingles Vaccine? Yes   Zostavax completed No   Shingrix Completed?: No.    Education has been provided regarding the importance of this vaccine. Patient has been advised to call insurance company to determine out of pocket expense if they have not yet received this vaccine. Advised may also receive vaccine at local pharmacy or Health Dept. Verbalized acceptance and understanding.  Screening Tests Health Maintenance  Topic Date Due   DTaP/Tdap/Td (1 - Tdap) Never done   Zoster Vaccines- Shingrix (1 of 2) Never done   COVID-19 Vaccine (3 - 2023-24 season) 02/26/2022   INFLUENZA VACCINE  09/26/2022 (Originally 01/26/2022)   Pneumonia Vaccine 71+ Years old (2 of 2 - PPSV23 or PCV20) 11/12/2022 (Originally 04/29/2021)   Medicare Annual Wellness (AWV)  09/16/2023   DEXA SCAN  Completed   HPV VACCINES  Aged Out    Health Maintenance  Health Maintenance Due  Topic Date Due   DTaP/Tdap/Td (1 - Tdap) Never done   Zoster Vaccines- Shingrix (1 of 2) Never done   COVID-19 Vaccine (3 - 2023-24 season) 02/26/2022    Colorectal cancer screening: No longer required.   Mammogram status: No longer required due to age.  Bone Density status: Ordered declined . Pt provided with contact info and advised to call to schedule appt.  Lung Cancer Screening: (Low Dose CT Chest recommended if Age 5-80 years, 30 pack-year currently smoking OR have quit w/in 15years.) does not qualify.   Lung Cancer Screening Referral: n/a  Additional Screening:  Hepatitis C Screening: does not qualify;   Vision Screening: Recommended annual ophthalmology exams for early detection of glaucoma and other disorders of the eye. Is the patient up to date with their annual eye exam?  Yes  Who is the provider or what is the name of the office in which the patient attends annual eye exams? Dr.Lee  If pt is not established with a provider, would they like to be referred to a provider to establish care? No  .   Dental Screening: Recommended annual dental exams for proper oral hygiene  Community Resource Referral / Chronic Care Management: CRR required this visit?  No   CCM required this visit?  No      Plan:     I have personally reviewed and noted the following in the patient's chart:   Medical and social history Use of alcohol, tobacco or illicit drugs  Current medications and supplements including opioid prescriptions. Patient is not currently taking opioid prescriptions. Functional ability and status Nutritional status Physical activity Advanced directives List of other physicians Hospitalizations, surgeries, and ER visits in previous 12 months Vitals Screenings to include cognitive, depression, and falls Referrals and appointments  In addition, I have reviewed and discussed with patient certain preventive protocols, quality metrics, and best practice recommendations. A written personalized care plan for preventive services as well as general preventive health recommendations were provided to patient.     Daphane Shepherd, LPN   [id number]   Nurse Notes: none

## 2022-09-16 NOTE — Patient Instructions (Signed)
[name] , Thank you for taking time to come for your Medicare Wellness Visit. I appreciate your ongoing commitment to your health goals. Please review the following plan we discussed and let me know if I can assist you in the future.   These are the goals we discussed:  Goals      AWV     09/14/2021 AWV Goal: Exercise for General Health  Patient will verbalize understanding of the benefits of increased physical activity: Exercising regularly is important. It will improve your overall fitness, flexibility, and endurance. Regular exercise also will improve your overall health. It can help you control your weight, reduce stress, and improve your bone density. Over the next year, patient will increase physical activity as tolerated with a goal of at least 150 minutes of moderate physical activity per week.  You can tell that you are exercising at a moderate intensity if your heart starts beating faster and you start breathing faster but can still hold a conversation. Moderate-intensity exercise ideas include: Walking 1 mile (1.6 km) in about 15 minutes Biking Hiking Golfing Dancing Water aerobics Patient will verbalize understanding of everyday activities that increase physical activity by providing examples like the following: Yard work, such as: Sales promotion account executive Gardening Washing windows or floors Patient will be able to explain general safety guidelines for exercising:  Before you start a new exercise program, talk with your health care provider. Do not exercise so much that you hurt yourself, feel dizzy, or get very short of breath. Wear comfortable clothes and wear shoes with good support. Drink plenty of water while you exercise to prevent dehydration or heat stroke. Work out until your breathing and your heartbeat get faster.      Chronic Disease Management Needs      Current Barriers:   Chronic Disease Management support, education, and care coordination needs related to Afib, HTN , CKD, hypothyroidism  Clinical Goal(s) related to Afib, HTN , CKD, hypothyroidism:  Over the next 60 days, patient will:  Work with the care management team to address educational, disease management, and care coordination needs  Call provider office for new or worsened signs and symptoms  Call care management team with questions or concerns Verbalize basic understanding of patient centered plan of care established today  Interventions related to Afib, HTN , CKD, hypothyroidism:  Evaluation of current treatment plans and patient's adherence to plan as established by provider Assessed patient understanding of disease states Assessed patient's education and care coordination needs Provided disease specific education to patient  Collaborated with appropriate clinical care team members regarding patient needs  Patient Self Care Activities related to Afib, HTN , CKD, hypothyroidism:  Patient is unable to independently self-manage chronic health conditions  Initial goal documentation      Exercise 150 min/wk Moderate Activity     Have 3 meals a day        This is a list of the screening recommended for you and due dates:  Health Maintenance  Topic Date Due   DTaP/Tdap/Td vaccine (1 - Tdap) Never done   Zoster (Shingles) Vaccine (1 of 2) Never done   COVID-19 Vaccine (3 - 2023-24 season) 02/26/2022   Flu Shot  09/26/2022*   Pneumonia Vaccine (2 of 2 - PPSV23 or PCV20) 11/12/2022*   Medicare Annual Wellness Visit  09/16/2023   DEXA scan (bone density measurement)  Completed   HPV Vaccine  Aged  Out  *Topic was postponed. The date shown is not the original due date.    Advanced directives: Advance directive discussed with you today. I have provided a copy for you to complete at home and have notarized. Once this is complete please bring a copy in to our office so we can scan it into your  chart.   Conditions/risks identified: Aim for 30 minutes of exercise or brisk walking, 6-8 glasses of water, and 5 servings of fruits and vegetables each day.   Next appointment: Follow up in one year for your annual wellness visit    Preventive Care 65 Years and Older, Female Preventive care refers to lifestyle choices and visits with your health care provider that can promote health and wellness. What does preventive care include? A yearly physical exam. This is also called an annual well check. Dental exams once or twice a year. Routine eye exams. Ask your health care provider how often you should have your eyes checked. Personal lifestyle choices, including: Daily care of your teeth and gums. Regular physical activity. Eating a healthy diet. Avoiding tobacco and drug use. Limiting alcohol use. Practicing safe sex. Taking low-dose aspirin every day. Taking vitamin and mineral supplements as recommended by your health care provider. What happens during an annual well check? The services and screenings done by your health care provider during your annual well check will depend on your age, overall health, lifestyle risk factors, and family history of disease. Counseling  Your health care provider may ask you questions about your: Alcohol use. Tobacco use. Drug use. Emotional well-being. Home and relationship well-being. Sexual activity. Eating habits. History of falls. Memory and ability to understand (cognition). Work and work Statistician. Reproductive health. Screening  You may have the following tests or measurements: Height, weight, and BMI. Blood pressure. Lipid and cholesterol levels. These may be checked every 5 years, or more frequently if you are over 5 years old. Skin check. Lung cancer screening. You may have this screening every year starting at age 84 if you have a 30-pack-year history of smoking and currently smoke or have quit within the past 15  years. Fecal occult blood test (FOBT) of the stool. You may have this test every year starting at age 3. Flexible sigmoidoscopy or colonoscopy. You may have a sigmoidoscopy every 5 years or a colonoscopy every 10 years starting at age 76. Hepatitis C blood test. Hepatitis B blood test. Sexually transmitted disease (STD) testing. Diabetes screening. This is done by checking your blood sugar (glucose) after you have not eaten for a while (fasting). You may have this done every 1-3 years. Bone density scan. This is done to screen for osteoporosis. You may have this done starting at age 51. Mammogram. This may be done every 1-2 years. Talk to your health care provider about how often you should have regular mammograms. Talk with your health care provider about your test results, treatment options, and if necessary, the need for more tests. Vaccines  Your health care provider may recommend certain vaccines, such as: Influenza vaccine. This is recommended every year. Tetanus, diphtheria, and acellular pertussis (Tdap, Td) vaccine. You may need a Td booster every 10 years. Zoster vaccine. You may need this after age 85. Pneumococcal 13-valent conjugate (PCV13) vaccine. One dose is recommended after age 32. Pneumococcal polysaccharide (PPSV23) vaccine. One dose is recommended after age 30. Talk to your health care provider about which screenings and vaccines you need and how often you need them. This information  is not intended to replace advice given to you by your health care provider. Make sure you discuss any questions you have with your health care provider. Document Released: 07/11/2015 Document Revised: 03/03/2016 Document Reviewed: 04/15/2015 Elsevier Interactive Patient Education  2017 [place] Prevention in the Home Falls can cause injuries. They can happen to people of all ages. There are many things you can do to make your home safe and to help prevent falls. What can I do on  the outside of my home? Regularly fix the edges of walkways and driveways and fix any cracks. Remove anything that might make you trip as you walk through a door, such as a raised step or threshold. Trim any bushes or trees on the path to your home. Use bright outdoor lighting. Clear any walking paths of anything that might make someone trip, such as rocks or tools. Regularly check to see if handrails are loose or broken. Make sure that both sides of any steps have handrails. Any raised decks and porches should have guardrails on the edges. Have any leaves, snow, or ice cleared regularly. Use sand or salt on walking paths during winter. Clean up any spills in your garage right away. This includes oil or grease spills. What can I do in the bathroom? Use night lights. Install grab bars by the toilet and in the tub and shower. Do not use towel bars as grab bars. Use non-skid mats or decals in the tub or shower. If you need to sit down in the shower, use a plastic, non-slip stool. Keep the floor dry. Clean up any water that spills on the floor as soon as it happens. Remove soap buildup in the tub or shower regularly. Attach bath mats securely with double-sided non-slip rug tape. Do not have throw rugs and other things on the floor that can make you trip. What can I do in the bedroom? Use night lights. Make sure that you have a light by your bed that is easy to reach. Do not use any sheets or blankets that are too big for your bed. They should not hang down onto the floor. Have a firm chair that has side arms. You can use this for support while you get dressed. Do not have throw rugs and other things on the floor that can make you trip. What can I do in the kitchen? Clean up any spills right away. Avoid walking on wet floors. Keep items that you use a lot in easy-to-reach places. If you need to reach something above you, use a strong step stool that has a grab bar. Keep electrical cords out  of the way. Do not use floor polish or wax that makes floors slippery. If you must use wax, use non-skid floor wax. Do not have throw rugs and other things on the floor that can make you trip. What can I do with my stairs? Do not leave any items on the stairs. Make sure that there are handrails on both sides of the stairs and use them. Fix handrails that are broken or loose. Make sure that handrails are as long as the stairways. Check any carpeting to make sure that it is firmly attached to the stairs. Fix any carpet that is loose or worn. Avoid having throw rugs at the top or bottom of the stairs. If you do have throw rugs, attach them to the floor with carpet tape. Make sure that you have a light switch at the top of  the stairs and the bottom of the stairs. If you do not have them, ask someone to add them for you. What else can I do to help prevent falls? Wear shoes that: Do not have high heels. Have rubber bottoms. Are comfortable and fit you well. Are closed at the toe. Do not wear sandals. If you use a stepladder: Make sure that it is fully opened. Do not climb a closed stepladder. Make sure that both sides of the stepladder are locked into place. Ask someone to hold it for you, if possible. Clearly mark and make sure that you can see: Any grab bars or handrails. First and last steps. Where the edge of each step is. Use tools that help you move around (mobility aids) if they are needed. These include: Canes. Walkers. Scooters. Crutches. Turn on the lights when you go into a dark area. Replace any light bulbs as soon as they burn out. Set up your furniture so you have a clear path. Avoid moving your furniture around. If any of your floors are uneven, fix them. If there are any pets around you, be aware of where they are. Review your medicines with your doctor. Some medicines can make you feel dizzy. This can increase your chance of falling. Ask your doctor what other things that  you can do to help prevent falls. This information is not intended to replace advice given to you by your health care provider. Make sure you discuss any questions you have with your health care provider. Document Released: 04/10/2009 Document Revised: 11/20/2015 Document Reviewed: 07/19/2014 Elsevier Interactive Patient Education  2017 [employer].

## 2022-09-28 ENCOUNTER — Ambulatory Visit: Payer: MEDICARE | Admitting: Family Medicine

## 2022-10-01 ENCOUNTER — Encounter: Payer: Self-pay | Admitting: Family Medicine

## 2022-10-06 ENCOUNTER — Encounter: Payer: Self-pay | Admitting: Family Medicine

## 2022-10-06 ENCOUNTER — Ambulatory Visit (INDEPENDENT_AMBULATORY_CARE_PROVIDER_SITE_OTHER): Payer: MEDICARE | Admitting: Family Medicine

## 2022-10-06 VITALS — BP 116/75 | HR 73 | Temp 98.3°F | Ht 68.0 in | Wt 143.0 lb

## 2022-10-06 DIAGNOSIS — Z7901 Long term (current) use of anticoagulants: Secondary | ICD-10-CM

## 2022-10-06 DIAGNOSIS — I480 Paroxysmal atrial fibrillation: Secondary | ICD-10-CM | POA: Diagnosis not present

## 2022-10-06 DIAGNOSIS — F4321 Adjustment disorder with depressed mood: Secondary | ICD-10-CM

## 2022-10-06 LAB — COAGUCHEK XS/INR WAIVED
INR: 2.2 — ABNORMAL HIGH (ref 0.9–1.1)
Prothrombin Time: 26.2 s

## 2022-10-06 NOTE — Progress Notes (Unsigned)
Subjective: CC:*** PCP: [name], DO SAY:TKZSW [name] is a 89 y.o. female presenting to clinic today for:  1. ***   ROS: Per HPI  Allergies  Allergen Reactions   Statins Other (See Comments)    Myalgia   Past Medical History:  Diagnosis Date   A-fib    Dyslipidemia    Hyperlipidemia    Hypothyroidism    2 degree amidarone    Pacemaker    PAF (paroxysmal atrial fibrillation)    S/P mitral valve repair    SSS (sick sinus syndrome)    Systemic hypertension     Current Outpatient Medications:    furosemide (LASIX) 20 MG tablet, Take 1 tablet (20 mg total) by mouth daily as needed for edema., Disp: 10 tablet, Rfl: 0   levothyroxine (SYNTHROID) 25 MCG tablet, TAKE 1 TABLET DAILY, Disp: 90 tablet, Rfl: 3   metoprolol tartrate (LOPRESSOR) 50 MG tablet, TAKE 1 TABLET TWICE A DAY, Disp: 180 tablet, Rfl: 3   Polyethyl Glycol-Propyl Glycol (SYSTANE OP), Apply 1 drop to eye daily as needed (dry eyes)., Disp: , Rfl:    triamterene-hydrochlorothiazide (DYAZIDE) 37.5-25 MG capsule, TAKE 1 CAPSULE DAILY, Disp: 90 capsule, Rfl: 3   warfarin (COUMADIN) 2.5 MG tablet, TAKE 1 TABLET DAILY EXCEPT TAKE 2 TABLETS ON FRIDAYS, Disp: 90 tablet, Rfl: 3 Social History   Socioeconomic History   Marital status: Widowed    Spouse name: Not on file   Number of children: 3   Years of education: Not on file   Highest education level: High school graduate  Occupational History   Occupation: Retired  Tobacco Use   Smoking status: Never   Smokeless tobacco: Never  Vaping Use   Vaping Use: Never used  Substance and Sexual Activity   Alcohol use: No   Drug use: Never   Sexual activity: Not Currently  Other Topics Concern   Not on file  Social History Narrative   Husband passed 06/29/2021   2 sons live with her   Social Determinants of Health   Financial Resource Strain: Low Risk  (09/16/2022)   Overall Financial Resource Strain (CARDIA)    Difficulty of Paying Living Expenses:  Not hard at all  Food Insecurity: No Food Insecurity (09/16/2022)   Hunger Vital Sign    Worried About Running Out of Food in the Last Year: Never true    Ran Out of Food in the Last Year: Never true  Transportation Needs: No Transportation Needs (09/16/2022)   PRAPARE - Administrator, Civil Service (Medical): No    Lack of Transportation (Non-Medical): No  Physical Activity: Insufficiently Active (09/16/2022)   Exercise Vital Sign    Days of Exercise per Week: 3 days    Minutes of Exercise per Session: 30 min  Stress: No Stress Concern Present (09/16/2022)   [name]    Feeling of Stress : Not at all  Social Connections: Moderately Isolated (09/16/2022)   Social Connection and Isolation Panel [NHANES]    Frequency of Communication with Friends and Family: More than three times a week    Frequency of Social Gatherings with Friends and Family: More than three times a week    Attends Religious Services: More than 4 times per year    Active Member of [employer] or Organizations: No    Attends Banker Meetings: Never    Marital Status: Widowed  Intimate Partner Violence: Not At Risk (09/16/2022)  Humiliation, Afraid, Rape, and Kick questionnaire    Fear of Current or Ex-Partner: No    Emotionally Abused: No    Physically Abused: No    Sexually Abused: No   Family History  Problem Relation Age of Onset   Heart attack Mother    Heart attack Father    Parkinson's disease Son     Objective: Office vital signs reviewed. Ht 5\' 8"  (1.727 m)   BMI 22.05 kg/m   Physical Examination:  General: Awake, alert, *** nourished, No acute distress HEENT: Normal    Neck: No masses palpated. No lymphadenopathy    Ears: Tympanic membranes intact, normal light reflex, no erythema, no bulging    Eyes: PERRLA, extraocular membranes intact, sclera ***    Nose: nasal turbinates moist, *** nasal discharge     Throat: moist mucus membranes, no erythema, *** tonsillar exudate.  Airway is patent Cardio: regular rate and rhythm, S1S2 heard, no murmurs appreciated Pulm: clear to auscultation bilaterally, no wheezes, rhonchi or rales; normal work of breathing on room air GI: soft, non-tender, non-distended, bowel sounds present x4, no hepatomegaly, no splenomegaly, no masses GU: external vaginal tissue ***, cervix ***, *** punctate lesions on cervix appreciated, *** discharge from cervical os, *** bleeding, *** cervical motion tenderness, *** abdominal/ adnexal masses Extremities: warm, well perfused, No edema, cyanosis or clubbing; +*** pulses bilaterally MSK: *** gait and *** station Skin: dry; intact; no rashes or lesions Neuro: *** Strength and light touch sensation grossly intact, *** DTRs ***/4  Assessment/ Plan: 89 y.o. female   ***  Orders Placed This Encounter  Procedures   CoaguChek XS/INR Waived   No orders of the defined types were placed in this encounter.    [name], DO [facility] [phone]

## 2022-11-09 ENCOUNTER — Other Ambulatory Visit: Payer: Self-pay | Admitting: Family Medicine

## 2022-11-24 ENCOUNTER — Encounter: Payer: Self-pay | Admitting: Family Medicine

## 2022-11-24 ENCOUNTER — Ambulatory Visit (INDEPENDENT_AMBULATORY_CARE_PROVIDER_SITE_OTHER): Payer: MEDICARE | Admitting: Family Medicine

## 2022-11-24 VITALS — BP 119/64 | HR 68 | Temp 98.5°F | Ht 68.0 in | Wt 145.0 lb

## 2022-11-24 DIAGNOSIS — Z7901 Long term (current) use of anticoagulants: Secondary | ICD-10-CM | POA: Diagnosis not present

## 2022-11-24 DIAGNOSIS — I480 Paroxysmal atrial fibrillation: Secondary | ICD-10-CM

## 2022-11-24 LAB — COAGUCHEK XS/INR WAIVED
INR: 2 — ABNORMAL HIGH (ref 0.9–1.1)
Prothrombin Time: 23.9 s

## 2022-11-24 NOTE — Progress Notes (Signed)
Subjective: CC: Atrial fibrillation PCP: [name], DO WGN:FAOZH H Strength is a 89 y.o. female presenting to clinic today for:  1.  Atrial fibrillation Patient reports compliance with medications.  Denies any vaginal bleeding, rectal bleeding.   ROS: Per HPI  Allergies  Allergen Reactions   Statins Other (See Comments)    Myalgia   Past Medical History:  Diagnosis Date   A-fib (HCC)    Dyslipidemia    Hyperlipidemia    Hypothyroidism    2 degree amidarone    Pacemaker    PAF (paroxysmal atrial fibrillation) (HCC)    S/P mitral valve repair    SSS (sick sinus syndrome) (HCC)    Systemic hypertension     Current Outpatient Medications:    furosemide (LASIX) 20 MG tablet, Take 1 tablet (20 mg total) by mouth daily as needed for edema., Disp: 10 tablet, Rfl: 0   levothyroxine (SYNTHROID) 25 MCG tablet, TAKE 1 TABLET DAILY, Disp: 90 tablet, Rfl: 3   metoprolol tartrate (LOPRESSOR) 50 MG tablet, TAKE 1 TABLET TWICE A DAY, Disp: 180 tablet, Rfl: 3   Polyethyl Glycol-Propyl Glycol (SYSTANE OP), Apply 1 drop to eye daily as needed (dry eyes)., Disp: , Rfl:    triamterene-hydrochlorothiazide (DYAZIDE) 37.5-25 MG capsule, TAKE 1 CAPSULE DAILY, Disp: 90 capsule, Rfl: 3   warfarin (COUMADIN) 2.5 MG tablet, TAKE 1 TABLET DAILY EXCEPT TAKE 2 TABLETS ON FRIDAYS, Disp: 90 tablet, Rfl: 3 Social History   Socioeconomic History   Marital status: Widowed    Spouse name: Not on file   Number of children: 3   Years of education: Not on file   Highest education level: High school graduate  Occupational History   Occupation: Retired  Tobacco Use   Smoking status: Never   Smokeless tobacco: Never  Vaping Use   Vaping Use: Never used  Substance and Sexual Activity   Alcohol use: No   Drug use: Never   Sexual activity: Not Currently  Other Topics Concern   Not on file  Social History Narrative   Husband passed 06/29/2021   2 sons live with her   Social Determinants of  Health   Financial Resource Strain: Low Risk  (09/16/2022)   Overall Financial Resource Strain (CARDIA)    Difficulty of Paying Living Expenses: Not hard at all  Food Insecurity: No Food Insecurity (09/16/2022)   Hunger Vital Sign    Worried About Running Out of Food in the Last Year: Never true    Ran Out of Food in the Last Year: Never true  Transportation Needs: No Transportation Needs (09/16/2022)   PRAPARE - Administrator, Civil Service (Medical): No    Lack of Transportation (Non-Medical): No  Physical Activity: Insufficiently Active (09/16/2022)   Exercise Vital Sign    Days of Exercise per Week: 3 days    Minutes of Exercise per Session: 30 min  Stress: No Stress Concern Present (09/16/2022)   [name]    Feeling of Stress : Not at all  Social Connections: Moderately Isolated (09/16/2022)   Social Connection and Isolation Panel [NHANES]    Frequency of Communication with Friends and Family: More than three times a week    Frequency of Social Gatherings with Friends and Family: More than three times a week    Attends Religious Services: More than 4 times per year    Active Member of Clubs or Organizations: No    Attends  Club or Organization Meetings: Never    Marital Status: Widowed  Intimate Partner Violence: Not At Risk (09/16/2022)   Humiliation, Afraid, Rape, and Kick questionnaire    Fear of Current or Ex-Partner: No    Emotionally Abused: No    Physically Abused: No    Sexually Abused: No   Family History  Problem Relation Age of Onset   Heart attack Mother    Heart attack Father    Parkinson's disease Son     Objective: Office vital signs reviewed. BP 119/64   Pulse 68   Temp 98.5 F (36.9 C)   Ht 5\' 8"  (1.727 m)   Wt 145 lb (65.8 kg)   SpO2 99%   BMI 22.05 kg/m   Physical Examination:  General: Awake, alert, well nourished, No acute distress HEENT: No conjunctival  pallor Cardio: regular rate and rhythm, S1S2 heard, no murmurs appreciated Pulm: clear to auscultation bilaterally, no wheezes, rhonchi or rales; normal work of breathing on room air MSK: Arrives in wheel chair.  Valgus deformity of knees present   Assessment/ Plan: 89 y.o. female   Anticoagulation monitoring, INR range 2-3 - Plan: CoaguChek XS/INR Waived  Paroxysmal atrial fibrillation (HCC)  INR therapeutic at 2.0.  No changes.  May follow-up in 6 to 8 weeks, sooner if concerns arise  Orders Placed This Encounter  Procedures   CoaguChek XS/INR Waived   No orders of the defined types were placed in this encounter.    [name], DO [facility] [phone]

## 2022-11-30 ENCOUNTER — Ambulatory Visit (INDEPENDENT_AMBULATORY_CARE_PROVIDER_SITE_OTHER): Payer: MEDICARE

## 2022-11-30 DIAGNOSIS — I495 Sick sinus syndrome: Secondary | ICD-10-CM | POA: Diagnosis not present

## 2022-12-03 LAB — CUP PACEART REMOTE DEVICE CHECK
Battery Remaining Longevity: 12 mo
Battery Voltage: 2.84 V
Brady Statistic AP VP Percent: 1.26 %
Brady Statistic AP VS Percent: 84.79 %
Brady Statistic AS VP Percent: 0.08 %
Brady Statistic AS VS Percent: 13.86 %
Brady Statistic RA Percent Paced: 86.3 %
Brady Statistic RV Percent Paced: 1.4 %
Date Time Interrogation Session: 20240606145739
Implantable Lead Connection Status: 753985
Implantable Lead Connection Status: 753985
Implantable Lead Implant Date: 20081126
Implantable Lead Implant Date: 20081126
Implantable Lead Location: 753859
Implantable Lead Location: 753860
Implantable Lead Model: 5076
Implantable Lead Model: 5076
Implantable Pulse Generator Implant Date: 20180228
Lead Channel Impedance Value: 323 Ohm
Lead Channel Impedance Value: 342 Ohm
Lead Channel Impedance Value: 342 Ohm
Lead Channel Impedance Value: 380 Ohm
Lead Channel Pacing Threshold Amplitude: 1 V
Lead Channel Pacing Threshold Pulse Width: 0.4 ms
Lead Channel Sensing Intrinsic Amplitude: 0.125 mV
Lead Channel Sensing Intrinsic Amplitude: 0.125 mV
Lead Channel Sensing Intrinsic Amplitude: 5.875 mV
Lead Channel Sensing Intrinsic Amplitude: 5.875 mV
Lead Channel Setting Pacing Amplitude: 3 V
Lead Channel Setting Pacing Amplitude: 3.5 V
Lead Channel Setting Pacing Pulse Width: 0.4 ms
Lead Channel Setting Sensing Sensitivity: 2 mV
Zone Setting Status: 755011
Zone Setting Status: 755011

## 2022-12-23 NOTE — Progress Notes (Signed)
Remote pacemaker transmission.   

## 2023-01-18 NOTE — Progress Notes (Unsigned)
Subjective: CC:INR check PCP: [name], DO KGM:WNUUV [name] is a 89 y.o. female presenting to clinic today for:  1. Afib Goal inr 2-3. ***  2. Hypothyroidism ***  3. CKD3a ***   ROS: Per HPI  Allergies  Allergen Reactions   Statins Other (See Comments)    Myalgia   Past Medical History:  Diagnosis Date   A-fib (HCC)    Dyslipidemia    Hyperlipidemia    Hypothyroidism    2 degree amidarone    Pacemaker    PAF (paroxysmal atrial fibrillation) (HCC)    S/P mitral valve repair    SSS (sick sinus syndrome) (HCC)    Systemic hypertension     Current Outpatient Medications:    furosemide (LASIX) 20 MG tablet, Take 1 tablet (20 mg total) by mouth daily as needed for edema., Disp: 10 tablet, Rfl: 0   levothyroxine (SYNTHROID) 25 MCG tablet, TAKE 1 TABLET DAILY, Disp: 90 tablet, Rfl: 3   metoprolol tartrate (LOPRESSOR) 50 MG tablet, TAKE 1 TABLET TWICE A DAY, Disp: 180 tablet, Rfl: 3   Polyethyl Glycol-Propyl Glycol (SYSTANE OP), Apply 1 drop to eye daily as needed (dry eyes)., Disp: , Rfl:    triamterene-hydrochlorothiazide (DYAZIDE) 37.5-25 MG capsule, TAKE 1 CAPSULE DAILY, Disp: 90 capsule, Rfl: 3   warfarin (COUMADIN) 2.5 MG tablet, TAKE 1 TABLET DAILY EXCEPT TAKE 2 TABLETS ON FRIDAYS, Disp: 90 tablet, Rfl: 3 Social History   Socioeconomic History   Marital status: Widowed    Spouse name: Not on file   Number of children: 3   Years of education: Not on file   Highest education level: High school graduate  Occupational History   Occupation: Retired  Tobacco Use   Smoking status: Never   Smokeless tobacco: Never  Vaping Use   Vaping status: Never Used  Substance and Sexual Activity   Alcohol use: No   Drug use: Never   Sexual activity: Not Currently  Other Topics Concern   Not on file  Social History Narrative   Husband passed 06/29/2021   2 sons live with her   Social Determinants of Health   Financial Resource Strain: Low Risk   (09/16/2022)   Overall Financial Resource Strain (CARDIA)    Difficulty of Paying Living Expenses: Not hard at all  Food Insecurity: No Food Insecurity (09/16/2022)   Hunger Vital Sign    Worried About Running Out of Food in the Last Year: Never true    Ran Out of Food in the Last Year: Never true  Transportation Needs: No Transportation Needs (09/16/2022)   PRAPARE - Administrator, Civil Service (Medical): No    Lack of Transportation (Non-Medical): No  Physical Activity: Insufficiently Active (09/16/2022)   Exercise Vital Sign    Days of Exercise per Week: 3 days    Minutes of Exercise per Session: 30 min  Stress: No Stress Concern Present (09/16/2022)   [name]    Feeling of Stress : Not at all  Social Connections: Moderately Isolated (09/16/2022)   Social Connection and Isolation Panel [NHANES]    Frequency of Communication with Friends and Family: More than three times a week    Frequency of Social Gatherings with Friends and Family: More than three times a week    Attends Religious Services: More than 4 times per year    Active Member of [employer] or Organizations: No    Attends Banker  Meetings: Never    Marital Status: Widowed  Intimate Partner Violence: Not At Risk (09/16/2022)   Humiliation, Afraid, Rape, and Kick questionnaire    Fear of Current or Ex-Partner: No    Emotionally Abused: No    Physically Abused: No    Sexually Abused: No   Family History  Problem Relation Age of Onset   Heart attack Mother    Heart attack Father    Parkinson's disease Son     Objective: Office vital signs reviewed. There were no vitals taken for this visit.  Physical Examination:  General: Awake, alert, *** nourished, No acute distress HEENT: Normal    Neck: No masses palpated. No lymphadenopathy    Ears: Tympanic membranes intact, normal light reflex, no erythema, no bulging    Eyes: PERRLA,  extraocular membranes intact, sclera ***    Nose: nasal turbinates moist, *** nasal discharge    Throat: moist mucus membranes, no erythema, *** tonsillar exudate.  Airway is patent Cardio: regular rate and rhythm, S1S2 heard, no murmurs appreciated Pulm: clear to auscultation bilaterally, no wheezes, rhonchi or rales; normal work of breathing on room air GI: soft, non-tender, non-distended, bowel sounds present x4, no hepatomegaly, no splenomegaly, no masses GU: external vaginal tissue ***, cervix ***, *** punctate lesions on cervix appreciated, *** discharge from cervical os, *** bleeding, *** cervical motion tenderness, *** abdominal/ adnexal masses Extremities: warm, well perfused, No edema, cyanosis or clubbing; +*** pulses bilaterally MSK: *** gait and *** station Skin: dry; intact; no rashes or lesions Neuro: *** Strength and light touch sensation grossly intact, *** DTRs ***/4  Assessment/ Plan: 89 y.o. female   ***  No orders of the defined types were placed in this encounter.  No orders of the defined types were placed in this encounter.    [name], DO [facility] [phone]

## 2023-01-19 ENCOUNTER — Encounter: Payer: Self-pay | Admitting: Family Medicine

## 2023-01-19 ENCOUNTER — Ambulatory Visit (INDEPENDENT_AMBULATORY_CARE_PROVIDER_SITE_OTHER): Payer: MEDICARE | Admitting: Family Medicine

## 2023-01-19 VITALS — BP 111/69 | HR 63 | Temp 98.7°F | Ht 68.0 in

## 2023-01-19 DIAGNOSIS — N1831 Chronic kidney disease, stage 3a: Secondary | ICD-10-CM

## 2023-01-19 DIAGNOSIS — Z7901 Long term (current) use of anticoagulants: Secondary | ICD-10-CM

## 2023-01-19 DIAGNOSIS — E039 Hypothyroidism, unspecified: Secondary | ICD-10-CM

## 2023-01-19 DIAGNOSIS — I480 Paroxysmal atrial fibrillation: Secondary | ICD-10-CM | POA: Diagnosis not present

## 2023-01-19 LAB — CMP14+EGFR

## 2023-01-19 LAB — CBC
Hemoglobin: 12 g/dL (ref 11.1–15.9)
MCH: 30.3 pg (ref 26.6–33.0)
MCHC: 33.4 g/dL (ref 31.5–35.7)
MCV: 91 fL (ref 79–97)
RBC: 3.96 x10E6/uL (ref 3.77–5.28)

## 2023-01-19 LAB — COAGUCHEK XS/INR WAIVED
INR: 2.4 — ABNORMAL HIGH (ref 0.9–1.1)
Prothrombin Time: 28.2 s

## 2023-01-20 LAB — CMP14+EGFR
AST: 24 IU/L (ref 0–40)
Albumin: 4 g/dL (ref 3.6–4.6)
BUN/Creatinine Ratio: 24 (ref 12–28)
BUN: 27 mg/dL (ref 10–36)
CO2: 27 mmol/L (ref 20–29)
Calcium: 9.7 mg/dL (ref 8.7–10.3)
Globulin, Total: 2.2 g/dL (ref 1.5–4.5)
Potassium: 4.4 mmol/L (ref 3.5–5.2)
Sodium: 140 mmol/L (ref 134–144)
Total Protein: 6.2 g/dL (ref 6.0–8.5)
eGFR: 45 mL/min/{1.73_m2} — ABNORMAL LOW (ref >59)

## 2023-01-20 LAB — CBC
Hematocrit: 35.9 % (ref 34.0–46.6)
Platelets: 181 10*3/uL (ref 150–450)
RDW: 13.5 % (ref 11.7–15.4)
WBC: 4.6 10*3/uL (ref 3.4–10.8)

## 2023-01-20 LAB — TSH: TSH: 2.35 u[IU]/mL (ref 0.450–4.500)

## 2023-01-20 LAB — T4, FREE: Free T4: 1.11 ng/dL (ref 0.82–1.77)

## 2023-01-20 LAB — MAGNESIUM: Magnesium: 2.1 mg/dL (ref 1.6–2.3)

## 2023-01-20 LAB — VITAMIN D 25 HYDROXY (VIT D DEFICIENCY, FRACTURES): Vit D, 25-Hydroxy: 19.6 ng/mL — ABNORMAL LOW (ref 30.0–100.0)

## 2023-01-21 ENCOUNTER — Other Ambulatory Visit: Payer: Self-pay | Admitting: Family Medicine

## 2023-02-03 ENCOUNTER — Ambulatory Visit: Payer: Medicare Other | Admitting: Cardiovascular Disease

## 2023-02-07 ENCOUNTER — Other Ambulatory Visit: Payer: Self-pay | Admitting: Cardiovascular Disease

## 2023-02-22 ENCOUNTER — Other Ambulatory Visit: Payer: Self-pay | Admitting: Cardiovascular Disease

## 2023-03-08 ENCOUNTER — Encounter: Payer: Self-pay | Admitting: Family Medicine

## 2023-03-08 ENCOUNTER — Ambulatory Visit: Payer: MEDICARE

## 2023-03-08 ENCOUNTER — Ambulatory Visit (INDEPENDENT_AMBULATORY_CARE_PROVIDER_SITE_OTHER): Payer: MEDICARE | Admitting: Family Medicine

## 2023-03-08 VITALS — BP 119/63 | HR 73 | Temp 98.8°F | Ht 68.0 in

## 2023-03-08 DIAGNOSIS — R791 Abnormal coagulation profile: Secondary | ICD-10-CM | POA: Diagnosis not present

## 2023-03-08 DIAGNOSIS — W19XXXA Unspecified fall, initial encounter: Secondary | ICD-10-CM | POA: Diagnosis not present

## 2023-03-08 DIAGNOSIS — Z7901 Long term (current) use of anticoagulants: Secondary | ICD-10-CM | POA: Diagnosis not present

## 2023-03-08 DIAGNOSIS — R0781 Pleurodynia: Secondary | ICD-10-CM

## 2023-03-08 DIAGNOSIS — I480 Paroxysmal atrial fibrillation: Secondary | ICD-10-CM

## 2023-03-08 LAB — COAGUCHEK XS/INR WAIVED
INR: 4.7 — ABNORMAL HIGH (ref 0.9–1.1)
Prothrombin Time: 56.5 s

## 2023-03-08 MED ORDER — PREDNISONE 20 MG PO TABS: 40.0000 mg | ORAL_TABLET | Freq: Every day | ORAL | 0 refills | Status: DC

## 2023-03-08 NOTE — Progress Notes (Signed)
Subjective: CC: INR check PCP: [name], DO WUJ:WJXBJ [name] is a 89 y.o. female presenting to clinic today for:  1.  Atrial fibrillation  Goal INR 2-3 She reports compliance with her medication.  She takes 1 tablet of 2.5 mg daily.  No reports of bleeding including vaginal bleeding, rectal bleeding etc.  She does report that on Saturday she sustained a mechanical fall where she tripped over her walker.  She injured her ribs.  She is currently utilizing a pain patch over-the-counter on that affected area.  Does not take any oral NSAIDs secondary to chronic anticoagulation.  Reports no hemoptysis, pain with breathing.  No tenderness to palpation or ecchymosis over the ribs but notes pain with movement, specifically twisting  ROS: Per HPI  Allergies  Allergen Reactions   Statins Other (See Comments)    Myalgia   Past Medical History:  Diagnosis Date   A-fib (HCC)    Dyslipidemia    Hyperlipidemia    Hypothyroidism    2 degree amidarone    Pacemaker    PAF (paroxysmal atrial fibrillation) (HCC)    S/P mitral valve repair    SSS (sick sinus syndrome) (HCC)    Systemic hypertension     Current Outpatient Medications:    furosemide (LASIX) 20 MG tablet, Take 1 tablet (20 mg total) by mouth daily as needed for edema., Disp: 10 tablet, Rfl: 0   levothyroxine (SYNTHROID) 25 MCG tablet, TAKE 1 TABLET DAILY, Disp: 90 tablet, Rfl: 3   metoprolol tartrate (LOPRESSOR) 50 MG tablet, TAKE 1 TABLET TWICE A DAY (KEEP UPCOMING APPOINTMENT FOR FUTURE REFILLS), Disp: 60 tablet, Rfl: 11   Polyethyl Glycol-Propyl Glycol (SYSTANE OP), Apply 1 drop to eye daily as needed (dry eyes)., Disp: , Rfl:    triamterene-hydrochlorothiazide (DYAZIDE) 37.5-25 MG capsule, TAKE 1 CAPSULE DAILY (PLEASE KEEP UPCOMING APPOINTMENT FOR FUTURE REFILLS, THANK YOU), Disp: 30 capsule, Rfl: 11   warfarin (COUMADIN) 2.5 MG tablet, TAKE 1 TABLET DAILY EXCEPT TAKE 2 TABLETS ON FRIDAYS, Disp: 90 tablet, Rfl:  3 Social History   Socioeconomic History   Marital status: Widowed    Spouse name: Not on file   Number of children: 3   Years of education: Not on file   Highest education level: High school graduate  Occupational History   Occupation: Retired  Tobacco Use   Smoking status: Never   Smokeless tobacco: Never  Vaping Use   Vaping status: Never Used  Substance and Sexual Activity   Alcohol use: No   Drug use: Never   Sexual activity: Not Currently  Other Topics Concern   Not on file  Social History Narrative   Husband passed 06/29/2021   2 sons live with her   Social Determinants of Health   Financial Resource Strain: Low Risk  (09/16/2022)   Overall Financial Resource Strain (CARDIA)    Difficulty of Paying Living Expenses: Not hard at all  Food Insecurity: No Food Insecurity (09/16/2022)   Hunger Vital Sign    Worried About Running Out of Food in the Last Year: Never true    Ran Out of Food in the Last Year: Never true  Transportation Needs: No Transportation Needs (09/16/2022)   PRAPARE - Administrator, Civil Service (Medical): No    Lack of Transportation (Non-Medical): No  Physical Activity: Insufficiently Active (09/16/2022)   Exercise Vital Sign    Days of Exercise per Week: 3 days    Minutes of Exercise per Session:  30 min  Stress: No Stress Concern Present (09/16/2022)   [name]    Feeling of Stress : Not at all  Social Connections: Moderately Isolated (09/16/2022)   Social Connection and Isolation Panel [NHANES]    Frequency of Communication with Friends and Family: More than three times a week    Frequency of Social Gatherings with Friends and Family: More than three times a week    Attends Religious Services: More than 4 times per year    Active Member of [employer] or Organizations: No    Attends Banker Meetings: Never    Marital Status: Widowed  Intimate Partner Violence:  Not At Risk (09/16/2022)   Humiliation, Afraid, Rape, and Kick questionnaire    Fear of Current or Ex-Partner: No    Emotionally Abused: No    Physically Abused: No    Sexually Abused: No   Family History  Problem Relation Age of Onset   Heart attack Mother    Heart attack Father    Parkinson's disease Son     Objective: Office vital signs reviewed. BP 119/63   Pulse 73   Temp 98.8 F (37.1 C)   Ht 5\' 8"  (1.727 m)   SpO2 100%   BMI 22.05 kg/m   Physical Examination:  General: Awake, alert, chronically ill-appearing female.  Appears somewhat disheveled compared to her normal HEENT: Sclera white.  Moist mucous membranes.  Somewhat dark circles under eyes Cardio: regular rate and rhythm  Pulm: clear to auscultation bilaterally, no wheezes, rhonchi or rales; normal work of breathing on room air MSK: No step-off deformities along the ribs.  No ecchymosis.  No soft tissue swelling, hematoma or tenderness to the rib. Neuro: Arrives in wheelchair.  Responds to questions appropriately.  Speech is clear.  Assessment/ Plan: 89 y.o. female   Supratherapeutic INR  Paroxysmal atrial fibrillation (HCC) - Plan: CoaguChek XS/INR Waived  Anticoagulation monitoring, INR range 2-3 - Plan: CoaguChek XS/INR Waived  Fall, initial encounter - Plan: DG Ribs Unilateral W/Chest Left  Rib pain on left side - Plan: DG Ribs Unilateral W/Chest Left, predniSONE (DELTASONE) 20 MG tablet  INR supratherapeutic today at 4.7.  I instructed her to stop Coumadin today and tomorrow.  Incorporate greens into her diet.  Then she may resume normal once daily dosing.  She has follow-up for INR recheck closely on Tuesday.  I am concerned about the fall.  We attempted to get a chest x-ray but due to her baseline mobility issues she had quite a bit of difficulty standing for the chest x-ray.  Clinically she does not appear to have a fracture but given her advanced age, frailty and chronic anticoagulation with  supratherapeutic levels I do want her to be watched extremely carefully and I discussed this with her son today.  We discussed that if she develops any shortness of breath, chest pain, hemoptysis or worsening pain she is to be seen immediately.  I have given her prednisone to help with pain relief as she did not want corticosteroid injection.  There was not evidence of stroke etc. on exam (as again at baseline she has mobility issues ) but should she start demonstrating any concerning features, low threshold to evaluate this further   [name], DO [facility] [phone]

## 2023-03-08 NOTE — Patient Instructions (Signed)
Hold Coumadin today and tomorrow May resume 1 tablet daily thereafter Return to clinic on Tuesday as scheduled for repeat INR testing Okay to eat greens today and tomorrow if you would like To start prednisone with lunch today and then repeat dose with breakfast starting tomorrow

## 2023-03-10 ENCOUNTER — Encounter: Payer: Self-pay | Admitting: Cardiovascular Disease

## 2023-03-10 ENCOUNTER — Ambulatory Visit: Payer: MEDICARE | Attending: Cardiovascular Disease | Admitting: Cardiovascular Disease

## 2023-03-10 ENCOUNTER — Telehealth: Payer: Self-pay | Admitting: Family Medicine

## 2023-03-10 VITALS — BP 134/62 | HR 82 | Ht 68.0 in | Wt 136.0 lb

## 2023-03-10 DIAGNOSIS — I872 Venous insufficiency (chronic) (peripheral): Secondary | ICD-10-CM | POA: Diagnosis not present

## 2023-03-10 DIAGNOSIS — N6315 Unspecified lump in the right breast, overlapping quadrants: Secondary | ICD-10-CM | POA: Diagnosis not present

## 2023-03-10 DIAGNOSIS — D6869 Other thrombophilia: Secondary | ICD-10-CM | POA: Diagnosis not present

## 2023-03-10 DIAGNOSIS — I10 Essential (primary) hypertension: Secondary | ICD-10-CM | POA: Diagnosis not present

## 2023-03-10 DIAGNOSIS — I480 Paroxysmal atrial fibrillation: Secondary | ICD-10-CM | POA: Insufficient documentation

## 2023-03-10 DIAGNOSIS — I495 Sick sinus syndrome: Secondary | ICD-10-CM | POA: Diagnosis not present

## 2023-03-10 DIAGNOSIS — Z950 Presence of cardiac pacemaker: Secondary | ICD-10-CM | POA: Diagnosis not present

## 2023-03-10 DIAGNOSIS — Z9889 Other specified postprocedural states: Secondary | ICD-10-CM | POA: Diagnosis not present

## 2023-03-10 NOTE — Telephone Encounter (Signed)
Pts son called to let PCP know that pt went to see her heart doctor today and was told that they think pt has breast cancer based on the large spot on her breast that keeps bleeding. No one knew about the spot on her breast until today because pt has hid it. Needs advise.

## 2023-03-10 NOTE — Telephone Encounter (Signed)
Spoke with pt's son he said that they already have a referral set up to go see a specialist. Son just wanted to let pcp know what was going on. Said if anything happened with pt he would call back to may an appointment for her to be seen.

## 2023-03-10 NOTE — Patient Instructions (Signed)
Medication Instructions:  No changes *If you need a refill on your cardiac medications before your next appointment, please call your pharmacy*  Follow-Up: At [facility], you and your health needs are our priority.  As part of our continuing mission to provide you with exceptional heart care, we have created designated Provider Care Teams.  These Care Teams include your primary Cardiologist (physician) and Advanced Practice Providers (APPs -  Physician Assistants and Nurse Practitioners) who all work together to provide you with the care you need, when you need it.  We recommend signing up for the patient portal called "MyChart".  Sign up information is provided on this After Visit Summary.  MyChart is used to connect with patients for Virtual Visits (Telemedicine).  Patients are able to view lab/test results, encounter notes, upcoming appointments, etc.  Non-urgent messages can be sent to your provider as well.   To learn more about what you can do with MyChart, go to [email or web address].    Your next appointment:   6 month(s)  Provider:   Dr Royann Shivers

## 2023-03-10 NOTE — Progress Notes (Signed)
Cardiology office note   Date:  03/10/2023   ID:  [name], DOB 01-20-1929, MRN [id number] PCP:  [name], DO  Cardiologist:  Chas Axel Electrophysiologist:  None   Evaluation Performed:  Follow-Up Visit  Chief Complaint:  Afib, pacemaker follow-up  History of Present Illness:    [name] is a 89 y.o. female with remote history of mitral valve annuloplasty and surgical ablation, sinus node dysfunction and paroxysmal atrial fibrillation with a dual-chamber permanent pacemaker (Medtronic 2008, original 5076 leads, Azure generator change out 2018).  She has not had any cardiovascular complications since her last appointment, but she did have 1 fall when she tripped against an obstacle.  She did not hit her head.  She bruised a rib on her left side.  She was advised to have a chest x-ray, but was unable to stand up for the procedure.  She reports being "very nervous" and she did not know why.  Her son, who accompanies her today has noticed that she has been out of sorts, but she will not really open up to him.  During exam, she is found to have a 15 cm irregular, indurated, ulcerated lesion of her right breast that is lightly bleeding.  It is immediately caudal to the areola and involves both the internal and external quadrant.  It is very firm and does not appear to be particularly tender.  It appears highly suspicious for malignancy.  She cannot tell me how long it has been there, she has avoided talking to her family about it.  I think this is the reason why she has been so nervous.  She has not had problems with chest pain or shortness of breath.  She has recently become more sedentary, since her fall a few days ago, but previously her pacemaker shows activity level of about 4 hours/day.  She continues to have some mild bilateral calf swelling due to varicose veins, unchanged.  She has not had overt bleeding problems other than this oozing at the site of her right  breast mass.  Her most recent hemoglobin was 12.0.  She generally feels well although her stamina has diminished.  She denies shortness of breath at rest or with activity or any problems with chest pain.  She does not have palpitations and denies dizziness or syncope.  She has mild chronic bilateral calf swelling and varicose veins.  Knee-high compression stockings are rather uncomfortable or not really doing a good job.  Overall the degree of edema has not changed.  She has not had any falls, serious injuries or bleeding problems.  Her most recent hemoglobin was 12.2 and she has normal renal function.  Pacemaker function is normal.  Estimated generator longevity is about 9 months (Medtronic Azure implanted in February 2018).  She is not pacemaker dependent.  She has roughly 86% atrial pacing and only 1.5% ventricular pacing.  Her burden of atrial fibrillation has been low at less than 0.1%, almost entirely made up by a 3-hour episode of atrial fibrillation in January.  Heart rate histogram distribution appears to be appropriate.  She has not had any episodes of high ventricular rate.  She has a relatively high pacing threshold on the atrial channel, but this has not changed (1.75 V at 1.0 ms pulse width, programmed amplitude to 3.5 V at 1.0 ms).   She has hypertension. She has a history of mitral valve annuloplasty and Cox Maze cryoablation procedure via minithoracotomy in 2009, has preserved left  ventricular systolic function, no residual mitral insufficiency, normal coronary arteries at preoperative angiogram.   Past Medical History:  Diagnosis Date   A-fib (HCC)    Dyslipidemia    Hyperlipidemia    Hypothyroidism    2 degree amidarone    Pacemaker    PAF (paroxysmal atrial fibrillation) (HCC)    S/P mitral valve repair    SSS (sick sinus syndrome) (HCC)    Systemic hypertension    Past Surgical History:  Procedure Laterality Date   ABDOMINAL HYSTERECTOMY  1989   CARDIAC CATHETERIZATION   01/11/2008   mild-mod pulm. hypertension,normal coronaries   MINIMALLY INVASIVE MAZE PROCEDURE  02/2008   MITRAL VALVE REPAIR  September 2009   [facility] physio ring annuloplasty   NM MYOVIEW LTD  03/28/2007   no ischemia   PACEMAKER PLACEMENT  November 2008   PPM GENERATOR CHANGEOUT N/A 08/25/2016   Procedure: PPM Generator Changeout - Battery Only;  Surgeon: Thurmon Fair, MD;  Location: [facility];  Service: Cardiovascular;  Laterality: N/A;   US ECHOCARDIOGRAPHY  03/07/2012   mild LVH,EF =>55%,mild MR,mod TR,trace AI,right atrium mod. dilated     Current Meds  Medication Sig   levothyroxine (SYNTHROID) 25 MCG tablet TAKE 1 TABLET DAILY   metoprolol tartrate (LOPRESSOR) 50 MG tablet TAKE 1 TABLET TWICE A DAY (KEEP UPCOMING APPOINTMENT FOR FUTURE REFILLS)   Polyethyl Glycol-Propyl Glycol (SYSTANE OP) Apply 1 drop to eye daily as needed (dry eyes).   triamterene-hydrochlorothiazide (DYAZIDE) 37.5-25 MG capsule TAKE 1 CAPSULE DAILY (PLEASE KEEP UPCOMING APPOINTMENT FOR FUTURE REFILLS, THANK YOU)     Allergies:   Statins   Social History   Tobacco Use   Smoking status: Never   Smokeless tobacco: Never  Vaping Use   Vaping status: Never Used  Substance Use Topics   Alcohol use: No   Drug use: Never     Family Hx: The patient's family history includes Heart attack in her father and mother; Parkinson's disease in her son.  ROS:   Please see the history of present illness.    All other systems reviewed and are negative.   Prior CV studies:   The following studies were reviewed today: Comprehensive pacemaker check today performed by me  Labs/Other Tests and Data Reviewed:    EKG: ECG performed today shows alternating atrial sensed (sinus) ventricular sensed and atrial paced ventricular sensed beats.  There is borderline left axis deviation but otherwise the tracing is normal.  QTc 420 ms.  Recent Labs: 01/19/2023: ALT 12; BUN 27; Creatinine, Ser 1.14; Hemoglobin 12.0;  Magnesium 2.1; Platelets 181; Potassium 4.4; Sodium 140; TSH 2.350   Recent Lipid Panel Lab Results  Component Value Date/Time   CHOL 223 (H) 05/21/2016 09:54 AM   TRIG 120 05/21/2016 09:54 AM   HDL 52 05/21/2016 09:54 AM   CHOLHDL 4.3 05/21/2016 09:54 AM   CHOLHDL 4.8 12/26/2012 10:57 AM   LDLCALC 147 (H) 05/21/2016 09:54 AM    Wt Readings from Last 3 Encounters:  03/10/23 136 lb (61.7 kg)  11/24/22 145 lb (65.8 kg)  10/06/22 143 lb (64.9 kg)     Objective:    Vital Signs:  BP 134/62 (BP Location: Left Arm, Patient Position: Sitting, Cuff Size: Normal)   Pulse 82   Ht 5\' 8"  (1.727 m)   Wt 136 lb (61.7 kg)   BMI 20.68 kg/m     General: Alert, oriented x3, no distress, appears elderly and more frail than when I last saw her.  Anxious.  Healthy left subclavian pacemaker site. Head: no evidence of trauma, PERRL, EOMI, no exophtalmos or lid lag, no myxedema, no xanthelasma; normal ears, nose and oropharynx Neck: normal jugular venous pulsations and no hepatojugular reflux; brisk carotid pulses without delay and no carotid bruits Chest: clear to auscultation, no signs of consolidation by percussion or palpation, normal fremitus, symmetrical and full respiratory excursions In the inferior half of the left breast there is an extensive indurated mass that occupies both the medial and lateral quadrants, just beneath the areola, with extensive skin ulceration and scant bleeding. Cardiovascular: normal position and quality of the apical impulse, regular rhythm, normal first and second heart sounds, no murmurs, rubs or gallops Abdomen: no tenderness or distention, no masses by palpation, no abnormal pulsatility or arterial bruits, normal bowel sounds, no hepatosplenomegaly Extremities: no clubbing, cyanosis or edema; 2+ radial, ulnar and brachial pulses bilaterally; 2+ right femoral, posterior tibial and dorsalis pedis pulses; 2+ left femoral, posterior tibial and dorsalis pedis pulses; no  subclavian or femoral bruits Neurological: grossly nonfocal Psych: Anxious, somewhat evasive.     ASSESSMENT & PLAN:    1. Paroxysmal atrial fibrillation (HCC)   2. Acquired thrombophilia (HCC)   3. Tachy-brady syndrome (HCC)   4. Pacemaker   5. Essential hypertension   6. History of mitral valve repair   7. Peripheral venous insufficiency   8. Mass of right breast, unspecified quadrant       Right breast mass: Unfortunately, this has all the hallmarks of a malignant neoplasm.  Mrs. Hoffert appears to be aware that this is a serious problem but has been treating it with some degree of denial.  She has not shared the abnormality with her family to date.  Her son was with her at this appointment and I shared my concerns with him.  Will make a referral to general surgery for potential biopsy.  She is at low risk for complications with temporary interruption of her warfarin anticoagulation.  In view of her age, she may choose a conservative approach, but a diagnosis should nevertheless be established.   AFib: Very low burden of atrial fibrillation, less than 0.1%.  In the past she has had lengthy episodes of A-fib lasting for about a day, but the longest episode in last 12 months was only 3-4 hours in duration.  It is asymptomatic.  It is appropriate to continue anticoagulation at this time, but stopping her anticoagulation for complications, surgical procedures, perceived high risk of bleeding, would be a fairly low risk proposition.. CHADSVasc 4 (age 52, HTN, gender).  Although she has a history of mitral valve repair she did not have rheumatic valvular heart disease.  Warfarin: Reports that her INR was about 4.0 and her warfarin has been held for 2 days.  She is monitored by her primary care provider in [place]. Tachy-bradycardia sd: Appropriate heart rate response with current sensor settings. PM: She is not pacemaker dependent.  She has high pacing threshold on the atrial channel but this is  still manageable.  She will be due for generator change out in about 9 months.  Consider placing a new atrial lead when it comes time for generator change out (although the issue may not be only be the lead, but also decreased atrial voltages after her maze procedure).  However, if workup of her breast mass shows poor prognosis, it would probably be wiser to simply perform a generator change out. HTN: Very well-controlled. Hx MV repair: Asymptomatic.  No symptoms of  heart failure.  No murmur on physical exam.  Has not had an echocardiogram since September 2013.  If there will be a plan for major surgery, would recommend repeating an echocardiogram. Peripheral venous insufficiency: Continue wearing compression stockings.  She has not been taking any furosemide.   Patient Instructions  Medication Instructions:  No changes *If you need a refill on your cardiac medications before your next appointment, please call your pharmacy*  Follow-Up: At [facility], you and your health needs are our priority.  As part of our continuing mission to provide you with exceptional heart care, we have created designated Provider Care Teams.  These Care Teams include your primary Cardiologist (physician) and Advanced Practice Providers (APPs -  Physician Assistants and Nurse Practitioners) who all work together to provide you with the care you need, when you need it.  We recommend signing up for the patient portal called "MyChart".  Sign up information is provided on this After Visit Summary.  MyChart is used to connect with patients for Virtual Visits (Telemedicine).  Patients are able to view lab/test results, encounter notes, upcoming appointments, etc.  Non-urgent messages can be sent to your provider as well.   To learn more about what you can do with MyChart, go to [email or web address].    Your next appointment:   6 month(s)  Provider:   Dr Royann Shivers    Signed, Thurmon Fair, MD  03/10/2023  3:25 PM    [facility]

## 2023-03-15 ENCOUNTER — Encounter: Payer: Self-pay | Admitting: Family Medicine

## 2023-03-15 ENCOUNTER — Ambulatory Visit: Payer: MEDICARE | Admitting: Family Medicine

## 2023-03-15 VITALS — BP 128/69 | HR 82 | Temp 98.0°F | Ht 68.0 in | Wt 136.0 lb

## 2023-03-15 DIAGNOSIS — W19XXXD Unspecified fall, subsequent encounter: Secondary | ICD-10-CM

## 2023-03-15 DIAGNOSIS — Z7901 Long term (current) use of anticoagulants: Secondary | ICD-10-CM

## 2023-03-15 DIAGNOSIS — I480 Paroxysmal atrial fibrillation: Secondary | ICD-10-CM

## 2023-03-15 DIAGNOSIS — Z9181 History of falling: Secondary | ICD-10-CM

## 2023-03-15 DIAGNOSIS — Z1331 Encounter for screening for depression: Secondary | ICD-10-CM

## 2023-03-15 LAB — COAGUCHEK XS/INR WAIVED
INR: 3.3 — ABNORMAL HIGH (ref 0.9–1.1)
Prothrombin Time: 39.1 s

## 2023-03-15 LAB — POCT INR: INR: 3.3 — AB (ref 2.0–3.0)

## 2023-03-15 NOTE — Progress Notes (Signed)
Goal INR 2-3  INR 03/15/23 3.3  Instructed to hold dose today. Resume normal schedule tomorrow 03/16/23 Repeat INR in one week 03/22/23

## 2023-03-15 NOTE — Progress Notes (Signed)
Subjective:  Patient ID: [name], female    DOB: Oct 26, 1928, 89 y.o.   MRN: [id number]  Patient Care Team: [name], DO as PCP - General (Family Medicine) Randa Spike Kelton Pillar, LCSW as [facility] Care Management (Licensed Clinical Social Worker) Croitoru, Rachelle Hora, MD as Consulting Physician (Cardiology) Chrystie Nose, MD as Consulting Physician (Cardiology)   Chief Complaint:  Coagulation Disorder  HPI: [name] is a 89 y.o. female presenting on 03/15/2023 for Coagulation Disorder   HPI 1. Anticoagulation monitoring, INR range 2-3 States that she was seen by cardiology yesterday and that she received a good report.  Denies any significant bleeding. Denies planned upcoming procedure. States that she may have a procedure on her breast.   2. Screening for Depression  States that she is "always upset" when she comes to healthcare office  Reports that "something comes over" her and that she feels worried.  States that she feels fine at home.  Endorses stress about getting ready, dressed, feels that she is always in a rush.   3. Fall  Denies any symptoms of trouble breathing, chest pain, coughing up blood.   Relevant past medical, surgical, family, and social history reviewed and updated as indicated.  Allergies and medications reviewed and updated. Data reviewed: Chart in Epic.   Past Medical History:  Diagnosis Date   A-fib (HCC)    Dyslipidemia    Hyperlipidemia    Hypothyroidism    2 degree amidarone    Pacemaker    PAF (paroxysmal atrial fibrillation) (HCC)    S/P mitral valve repair    SSS (sick sinus syndrome) (HCC)    Systemic hypertension     Past Surgical History:  Procedure Laterality Date   ABDOMINAL HYSTERECTOMY  1989   CARDIAC CATHETERIZATION  01/11/2008   mild-mod pulm. hypertension,normal coronaries   MINIMALLY INVASIVE MAZE PROCEDURE  02/2008   MITRAL VALVE REPAIR  September 2009   [facility] physio ring  annuloplasty   NM MYOVIEW LTD  03/28/2007   no ischemia   PACEMAKER PLACEMENT  November 2008   PPM GENERATOR CHANGEOUT N/A 08/25/2016   Procedure: PPM Generator Changeout - Battery Only;  Surgeon: Thurmon Fair, MD;  Location: [facility];  Service: Cardiovascular;  Laterality: N/A;   US ECHOCARDIOGRAPHY  03/07/2012   mild LVH,EF =>55%,mild MR,mod TR,trace AI,right atrium mod. dilated    Social History   Socioeconomic History   Marital status: Widowed    Spouse name: Not on file   Number of children: 3   Years of education: Not on file   Highest education level: High school graduate  Occupational History   Occupation: Retired  Tobacco Use   Smoking status: Never   Smokeless tobacco: Never  Vaping Use   Vaping status: Never Used  Substance and Sexual Activity   Alcohol use: No   Drug use: Never   Sexual activity: Not Currently  Other Topics Concern   Not on file  Social History Narrative   Husband passed 06/29/2021   2 sons live with her   Social Determinants of Health   Financial Resource Strain: Low Risk  (09/16/2022)   Overall Financial Resource Strain (CARDIA)    Difficulty of Paying Living Expenses: Not hard at all  Food Insecurity: No Food Insecurity (09/16/2022)   Hunger Vital Sign    Worried About Running Out of Food in the Last Year: Never true    Ran Out of Food in the Last  Year: Never true  Transportation Needs: No Transportation Needs (09/16/2022)   PRAPARE - Administrator, Civil Service (Medical): No    Lack of Transportation (Non-Medical): No  Physical Activity: Insufficiently Active (09/16/2022)   Exercise Vital Sign    Days of Exercise per Week: 3 days    Minutes of Exercise per Session: 30 min  Stress: No Stress Concern Present (09/16/2022)   [name]    Feeling of Stress : Not at all  Social Connections: Moderately Isolated (09/16/2022)   Social Connection and  Isolation Panel [NHANES]    Frequency of Communication with Friends and Family: More than three times a week    Frequency of Social Gatherings with Friends and Family: More than three times a week    Attends Religious Services: More than 4 times per year    Active Member of [employer] or Organizations: No    Attends Banker Meetings: Never    Marital Status: Widowed  Intimate Partner Violence: Not At Risk (09/16/2022)   Humiliation, Afraid, Rape, and Kick questionnaire    Fear of Current or Ex-Partner: No    Emotionally Abused: No    Physically Abused: No    Sexually Abused: No    Outpatient Encounter Medications as of 03/15/2023  Medication Sig   levothyroxine (SYNTHROID) 25 MCG tablet TAKE 1 TABLET DAILY   metoprolol tartrate (LOPRESSOR) 50 MG tablet TAKE 1 TABLET TWICE A DAY (KEEP UPCOMING APPOINTMENT FOR FUTURE REFILLS)   Polyethyl Glycol-Propyl Glycol (SYSTANE OP) Apply 1 drop to eye daily as needed (dry eyes).   triamterene-hydrochlorothiazide (DYAZIDE) 37.5-25 MG capsule TAKE 1 CAPSULE DAILY (PLEASE KEEP UPCOMING APPOINTMENT FOR FUTURE REFILLS, THANK YOU)   warfarin (COUMADIN) 2.5 MG tablet TAKE 1 TABLET DAILY EXCEPT TAKE 2 TABLETS ON FRIDAYS   furosemide (LASIX) 20 MG tablet Take 1 tablet (20 mg total) by mouth daily as needed for edema. (Patient not taking: Reported on 03/15/2023)   No facility-administered encounter medications on file as of 03/15/2023.    Allergies  Allergen Reactions   Statins Other (See Comments)    Myalgia    Review of Systems S per HPI   Objective:  BP 128/69   Pulse 82   Temp 98 F (36.7 C)   Ht 5\' 8"  (1.727 m)   Wt 136 lb (61.7 kg)   SpO2 96%   BMI 20.68 kg/m    Wt Readings from Last 3 Encounters:  03/15/23 136 lb (61.7 kg)  03/10/23 136 lb (61.7 kg)  11/24/22 145 lb (65.8 kg)    Physical Exam Constitutional:      General: She is awake. She is not in acute distress.    Appearance: Normal appearance. She is well-developed  and well-groomed. She is not ill-appearing, toxic-appearing or diaphoretic.  Cardiovascular:     Rate and Rhythm: Normal rate. Rhythm irregular.     Pulses: Normal pulses.          Radial pulses are 2+ on the right side and 2+ on the left side.       Posterior tibial pulses are 2+ on the right side and 2+ on the left side.     Heart sounds: Normal heart sounds. No murmur heard.    No gallop.  Pulmonary:     Effort: Pulmonary effort is normal. No respiratory distress.     Breath sounds: Normal breath sounds. No stridor. No wheezing, rhonchi or rales.  Musculoskeletal:  Cervical back: Full passive range of motion without pain and neck supple.     Comments: Generalized weakness, using wheelchair   Skin:    General: Skin is warm.     Capillary Refill: Capillary refill takes less than 2 seconds.  Neurological:     General: No focal deficit present.     Mental Status: She is alert, oriented to person, place, and time and easily aroused. Mental status is at baseline.     GCS: GCS eye subscore is 4. GCS verbal subscore is 5. GCS motor subscore is 6.     Motor: No weakness.  Psychiatric:        Attention and Perception: Attention and perception normal.        Mood and Affect: Mood is anxious and depressed. Affect is flat.        Speech: Speech is delayed.        Behavior: Behavior normal. Behavior is cooperative.        Thought Content: Thought content normal. Thought content does not include homicidal or suicidal ideation. Thought content does not include homicidal or suicidal plan.        Cognition and Memory: Cognition and memory normal.        Judgment: Judgment normal.    Results for orders placed or performed in visit on 03/15/23  POCT INR  Result Value Ref Range   INR 3.3 (A) 2.0 - 3.0      03/15/2023   10:12 AM 03/08/2023   10:02 AM 01/19/2023   10:03 AM 11/24/2022    2:59 PM 09/16/2022   10:35 AM  Depression screen PHQ 2/9  Decreased Interest 0 0 0 0 0  Down, Depressed,  Hopeless 0 0 0 0 0  PHQ - 2 Score 0 0 0 0 0  Altered sleeping 0 0 0 0 0  Tired, decreased energy 0 0 0 0 0  Change in appetite 0 0 0 0 0  Feeling bad or failure about yourself  0 0 0 0 0  Trouble concentrating 0 0 0 0 0  Moving slowly or fidgety/restless 0 0 0 0 0  Suicidal thoughts 0 0 0 0 0  PHQ-9 Score 0 0 0 0 0  Difficult doing work/chores Not difficult at all Not difficult at all Not difficult at all Not difficult at all Not difficult at all       03/15/2023   10:12 AM 03/08/2023   10:02 AM 01/19/2023   10:03 AM 11/24/2022    2:59 PM  GAD 7 : Generalized Anxiety Score  Nervous, Anxious, on Edge 0 0 0 0  Control/stop worrying 0 0 0 0  Worry too much - different things 0 0 0 0  Trouble relaxing 0 0 0 0  Restless 0 0 0 0  Easily annoyed or irritable 0 0 0 0  Afraid - awful might happen 0 0 0 0  Total GAD 7 Score 0 0 0 0  Anxiety Difficulty Not difficult at all Not difficult at all      Pertinent labs & imaging results that were available during my care of the patient were reviewed by me and considered in my medical decision making.  Assessment & Plan:  Julionna was seen today for coagulation disorder.  Diagnoses and all orders for this visit:  Anticoagulation monitoring, INR range 2-3 Reviewed INR in office with patient   Goal INR 2-3  INR 03/15/23 3.3  Instructed to hold dose today. Resume normal schedule  tomorrow 03/16/23 Repeat INR in one week 03/22/23 -     CoaguChek XS/INR Waived -     POCT INR  Paroxysmal atrial fibrillation (HCC) As above.  -     POCT INR  Encounter for screening for depression Patient very withdrawn in office today. Screening completed normal. Discussed with patient to follow up with PCP if she desired treatment for anxiety/depression   Fall, subsequent encounter Denies symptoms. Continue with close monitoring of family. Reviewed symptoms with patient    Continue all other maintenance medications.  Follow up plan: Return in about 1 week  (around 03/22/2023) for INR check .   Continue healthy lifestyle choices, including diet (rich in fruits, vegetables, and lean proteins, and low in salt and simple carbohydrates) and exercise (at least 30 minutes of moderate physical activity daily).  Written and verbal instructions provided   The above assessment and management plan was discussed with the patient. The patient verbalized understanding of and has agreed to the management plan. Patient is aware to call the clinic if they develop any new symptoms or if symptoms persist or worsen. Patient is aware when to return to the clinic for a follow-up visit. Patient educated on when it is appropriate to go to the emergency department.   Neale Burly, DNP-FNP [facility] [address] [place], [place] [address] [phone]

## 2023-03-15 NOTE — Patient Instructions (Addendum)
HOLD dose of coumadin 03/15/2023 Resume normal schedule 03/16/23 Return for follow up in one week 03/22/23

## 2023-03-17 ENCOUNTER — Encounter: Payer: Self-pay | Admitting: Cardiovascular Disease

## 2023-03-18 ENCOUNTER — Telehealth: Payer: Self-pay

## 2023-03-18 NOTE — Telephone Encounter (Signed)
Call received from Pt's son.  Pt attempted to send transmission.    Will call on Monday to assist with sending transmission.

## 2023-03-22 ENCOUNTER — Encounter: Payer: Self-pay | Admitting: Family Medicine

## 2023-03-22 ENCOUNTER — Ambulatory Visit (INDEPENDENT_AMBULATORY_CARE_PROVIDER_SITE_OTHER): Payer: MEDICARE | Admitting: Family Medicine

## 2023-03-22 VITALS — BP 119/73 | HR 78 | Temp 98.2°F | Ht 68.0 in | Wt 136.0 lb

## 2023-03-22 DIAGNOSIS — I480 Paroxysmal atrial fibrillation: Secondary | ICD-10-CM

## 2023-03-22 DIAGNOSIS — Z7901 Long term (current) use of anticoagulants: Secondary | ICD-10-CM | POA: Diagnosis not present

## 2023-03-22 DIAGNOSIS — N6320 Unspecified lump in the left breast, unspecified quadrant: Secondary | ICD-10-CM | POA: Diagnosis not present

## 2023-03-22 LAB — COAGUCHEK XS/INR WAIVED
INR: 1.1 (ref 0.9–1.1)
Prothrombin Time: 13.3 s

## 2023-03-22 NOTE — Progress Notes (Signed)
Discussed results in office. Patient to take 1.5 dose today and resume normal schedule tomorrow. Follow up with PCP in 10-14 days for recheck

## 2023-03-22 NOTE — Progress Notes (Signed)
Subjective:  Patient ID: [name], female    DOB: 06/28/1929, 89 y.o.   MRN: [id number]  Patient Care Team: [name], DO as PCP - General (Family Medicine) Randa Spike Kelton Pillar, LCSW as [facility] Care Management (Licensed Clinical Social Worker) Croitoru, Rachelle Hora, MD as Consulting Physician (Cardiology) Chrystie Nose, MD as Consulting Physician (Cardiology)   Chief Complaint:  Coagulation Disorder  HPI: [name] is a 89 y.o. female presenting on 03/22/2023 for Coagulation Disorder  HPI 1. Anticoagulation monitoring, INR range 2-3 States that she ate some spinach one time in the last week. Reports compliance with medication regimen. States that she takes one per night. She no longer takes two doses on Fridays. Denies any signs of bleeding. Denies any new falls.   2. Left Breast Mass  Per note from Cardiology, patient has left breast mass, she declines exam of mass.   Relevant past medical, surgical, family, and social history reviewed and updated as indicated.  Allergies and medications reviewed and updated. Data reviewed: Chart in Epic.   Past Medical History:  Diagnosis Date   A-fib (HCC)    Dyslipidemia    Hyperlipidemia    Hypothyroidism    2 degree amidarone    Pacemaker    PAF (paroxysmal atrial fibrillation) (HCC)    S/P mitral valve repair    SSS (sick sinus syndrome) (HCC)    Systemic hypertension     Past Surgical History:  Procedure Laterality Date   ABDOMINAL HYSTERECTOMY  1989   CARDIAC CATHETERIZATION  01/11/2008   mild-mod pulm. hypertension,normal coronaries   MINIMALLY INVASIVE MAZE PROCEDURE  02/2008   MITRAL VALVE REPAIR  September 2009   [facility] ring annuloplasty   NM MYOVIEW LTD  03/28/2007   no ischemia   PACEMAKER PLACEMENT  November 2008   PPM GENERATOR CHANGEOUT N/A 08/25/2016   Procedure: PPM Generator Changeout - Battery Only;  Surgeon: Thurmon Fair, MD;  Location: [facility];   Service: Cardiovascular;  Laterality: N/A;   US ECHOCARDIOGRAPHY  03/07/2012   mild LVH,EF =>55%,mild MR,mod TR,trace AI,right atrium mod. dilated    Social History   Socioeconomic History   Marital status: Widowed    Spouse name: Not on file   Number of children: 3   Years of education: Not on file   Highest education level: High school graduate  Occupational History   Occupation: Retired  Tobacco Use   Smoking status: Never   Smokeless tobacco: Never  Vaping Use   Vaping status: Never Used  Substance and Sexual Activity   Alcohol use: No   Drug use: Never   Sexual activity: Not Currently  Other Topics Concern   Not on file  Social History Narrative   Husband passed 06/29/2021   2 sons live with her   Social Determinants of Health   Financial Resource Strain: Low Risk  (09/16/2022)   Overall Financial Resource Strain (CARDIA)    Difficulty of Paying Living Expenses: Not hard at all  Food Insecurity: No Food Insecurity (09/16/2022)   Hunger Vital Sign    Worried About Running Out of Food in the Last Year: Never true    Ran Out of Food in the Last Year: Never true  Transportation Needs: No Transportation Needs (09/16/2022)   PRAPARE - Administrator, Civil Service (Medical): No    Lack of Transportation (Non-Medical): No  Physical Activity: Insufficiently Active (09/16/2022)   Exercise Vital Sign  Days of Exercise per Week: 3 days    Minutes of Exercise per Session: 30 min  Stress: No Stress Concern Present (09/16/2022)   [name]    Feeling of Stress : Not at all  Social Connections: Moderately Isolated (09/16/2022)   Social Connection and Isolation Panel [NHANES]    Frequency of Communication with Friends and Family: More than three times a week    Frequency of Social Gatherings with Friends and Family: More than three times a week    Attends Religious Services: More than 4 times per year     Active Member of [employer] or Organizations: No    Attends Banker Meetings: Never    Marital Status: Widowed  Intimate Partner Violence: Not At Risk (09/16/2022)   Humiliation, Afraid, Rape, and Kick questionnaire    Fear of Current or Ex-Partner: No    Emotionally Abused: No    Physically Abused: No    Sexually Abused: No    Outpatient Encounter Medications as of 03/22/2023  Medication Sig   furosemide (LASIX) 20 MG tablet Take 1 tablet (20 mg total) by mouth daily as needed for edema.   levothyroxine (SYNTHROID) 25 MCG tablet TAKE 1 TABLET DAILY   metoprolol tartrate (LOPRESSOR) 50 MG tablet TAKE 1 TABLET TWICE A DAY (KEEP UPCOMING APPOINTMENT FOR FUTURE REFILLS)   Polyethyl Glycol-Propyl Glycol (SYSTANE OP) Apply 1 drop to eye daily as needed (dry eyes).   triamterene-hydrochlorothiazide (DYAZIDE) 37.5-25 MG capsule TAKE 1 CAPSULE DAILY (PLEASE KEEP UPCOMING APPOINTMENT FOR FUTURE REFILLS, THANK YOU)   warfarin (COUMADIN) 2.5 MG tablet TAKE 1 TABLET DAILY EXCEPT TAKE 2 TABLETS ON FRIDAYS   No facility-administered encounter medications on file as of 03/22/2023.    Allergies  Allergen Reactions   Statins Other (See Comments)    Myalgia    Review of Systems As per HPI  Objective:  BP 119/73   Pulse 78   Temp 98.2 F (36.8 C)   Ht 5\' 8"  (1.727 m)   Wt 136 lb (61.7 kg)   SpO2 98%   BMI 20.68 kg/m    Wt Readings from Last 3 Encounters:  03/22/23 136 lb (61.7 kg)  03/15/23 136 lb (61.7 kg)  03/10/23 136 lb (61.7 kg)    Physical Exam Constitutional:      General: She is awake. She is not in acute distress.    Appearance: Normal appearance. She is well-developed and well-groomed. She is not ill-appearing, toxic-appearing or diaphoretic.  Cardiovascular:     Rate and Rhythm: Normal rate. Rhythm irregularly irregular.     Pulses: Normal pulses.          Radial pulses are 2+ on the right side and 2+ on the left side.       Posterior tibial pulses are 2+ on the  right side and 2+ on the left side.     Heart sounds: Normal heart sounds. No murmur heard.    No gallop.  Pulmonary:     Effort: Pulmonary effort is normal. No respiratory distress.     Breath sounds: Normal breath sounds. No stridor. No wheezing, rhonchi or rales.  Musculoskeletal:     Cervical back: Full passive range of motion without pain and neck supple.     Right lower leg: No edema.     Left lower leg: No edema.     Comments: Generalized weakness, using wheelchair   Skin:    General: Skin is warm.  Capillary Refill: Capillary refill takes less than 2 seconds.  Neurological:     General: No focal deficit present.     Mental Status: She is alert, oriented to person, place, and time and easily aroused. Mental status is at baseline.     GCS: GCS eye subscore is 4. GCS verbal subscore is 5. GCS motor subscore is 6.     Motor: No weakness.  Psychiatric:        Attention and Perception: Attention and perception normal.        Mood and Affect: Mood and affect normal.        Speech: Speech normal.        Behavior: Behavior normal. Behavior is cooperative.        Thought Content: Thought content normal. Thought content does not include homicidal or suicidal ideation. Thought content does not include homicidal or suicidal plan.        Cognition and Memory: Cognition and memory normal.        Judgment: Judgment normal.    Results for orders placed or performed in visit on 03/15/23  CoaguChek XS/INR Waived  Result Value Ref Range   INR 3.3 (H) 0.9 - 1.1   Prothrombin Time 39.1 sec  POCT INR  Result Value Ref Range   INR 3.3 (A) 2.0 - 3.0      03/22/2023   10:04 AM 03/15/2023   10:12 AM 03/08/2023   10:02 AM 01/19/2023   10:03 AM 11/24/2022    2:59 PM  Depression screen PHQ 2/9  Decreased Interest 0 0 0 0 0  Down, Depressed, Hopeless 0 0 0 0 0  PHQ - 2 Score 0 0 0 0 0  Altered sleeping 0 0 0 0 0  Tired, decreased energy 0 0 0 0 0  Change in appetite 0 0 0 0 0  Feeling bad  or failure about yourself  0 0 0 0 0  Trouble concentrating 0 0 0 0 0  Moving slowly or fidgety/restless 0 0 0 0 0  Suicidal thoughts 0 0 0 0 0  PHQ-9 Score 0 0 0 0 0  Difficult doing work/chores Not difficult at all Not difficult at all Not difficult at all Not difficult at all Not difficult at all       03/22/2023   10:04 AM 03/15/2023   10:12 AM 03/08/2023   10:02 AM 01/19/2023   10:03 AM  GAD 7 : Generalized Anxiety Score  Nervous, Anxious, on Edge 0 0 0 0  Control/stop worrying 0 0 0 0  Worry too much - different things 0 0 0 0  Trouble relaxing 0 0 0 0  Restless 0 0 0 0  Easily annoyed or irritable 0 0 0 0  Afraid - awful might happen 0 0 0 0  Total GAD 7 Score 0 0 0 0  Anxiety Difficulty Not difficult at all Not difficult at all Not difficult at all    Pertinent labs & imaging results that were available during my care of the patient were reviewed by me and considered in my medical decision making.  Assessment & Plan:  [name] was seen today for coagulation disorder.  Diagnoses and all orders for this visit:  Anticoagulation monitoring, INR range 2-3 Description   Goal INR 2-3  INR 03/22/23 1.1 Instructed to take 1.5 dose today (3.75 mg total). Resume normal schedule tomorrow 03/23/23 Repeat INR in 10-14 days Reiterated to patient to maintain consistency in diet.   -  CoaguChek XS/INR Waived  Paroxysmal atrial fibrillation (HCC) As above   Mass of left breast, unspecified quadrant Patient refused to allow myself or PCP, Gottschalk, DO to examine site. She has follow up with surgery next month. Did not order imaging at this time.    Continue all other maintenance medications.  Follow up plan: Return in about 2 weeks (around 04/05/2023) for INR RECHECK .  Continue healthy lifestyle choices, including diet (rich in fruits, vegetables, and lean proteins, and low in salt and simple carbohydrates) and exercise (at least 30 minutes of moderate physical activity  daily).  Written and verbal instructions provided   The above assessment and management plan was discussed with the patient. The patient verbalized understanding of and has agreed to the management plan. Patient is aware to call the clinic if they develop any new symptoms or if symptoms persist or worsen. Patient is aware when to return to the clinic for a follow-up visit. Patient educated on when it is appropriate to go to the emergency department.   Neale Burly, DNP-FNP [facility] [address] [place], [place] [address] [phone]

## 2023-03-25 NOTE — Telephone Encounter (Signed)
I spoke with the patient and she agreed to send the transmission.

## 2023-03-29 ENCOUNTER — Other Ambulatory Visit: Payer: Self-pay | Admitting: Surgery

## 2023-03-29 ENCOUNTER — Encounter: Payer: Self-pay | Admitting: Surgery

## 2023-03-29 DIAGNOSIS — N6314 Unspecified lump in the right breast, lower inner quadrant: Secondary | ICD-10-CM

## 2023-04-04 ENCOUNTER — Ambulatory Visit (INDEPENDENT_AMBULATORY_CARE_PROVIDER_SITE_OTHER): Payer: MEDICARE | Admitting: Family Medicine

## 2023-04-04 ENCOUNTER — Encounter: Payer: Self-pay | Admitting: Family Medicine

## 2023-04-04 VITALS — BP 131/68 | HR 69 | Temp 98.5°F | Ht 68.0 in | Wt 136.0 lb

## 2023-04-04 DIAGNOSIS — I480 Paroxysmal atrial fibrillation: Secondary | ICD-10-CM | POA: Diagnosis not present

## 2023-04-04 DIAGNOSIS — Z7901 Long term (current) use of anticoagulants: Secondary | ICD-10-CM

## 2023-04-04 LAB — COAGUCHEK XS/INR WAIVED
INR: 2.8 — ABNORMAL HIGH (ref 0.9–1.1)
Prothrombin Time: 33.5 s

## 2023-04-04 NOTE — Progress Notes (Signed)
Subjective: CC: Atrial fibrillation PCP: [name], DO [name] is a 89 y.o. female presenting to clinic today for:  1.  Atrial fibrillation Goal INR 2-3 Patient is compliant with medications.  She had resumed back her normal dosing schedule of 2.5 mg daily.  When she was rechecked 2 weeks ago her INR was subtherapeutic and they recommended that she just continue normal regimen.  She denies any bleeding.  She has an appointment for a mammography for breast lesions soon   ROS: Per HPI  Allergies  Allergen Reactions   Statins Other (See Comments)    Myalgia   Past Medical History:  Diagnosis Date   A-fib (HCC)    Dyslipidemia    Hyperlipidemia    Hypothyroidism    2 degree amidarone    Pacemaker    PAF (paroxysmal atrial fibrillation) (HCC)    S/P mitral valve repair    SSS (sick sinus syndrome) (HCC)    Systemic hypertension     Current Outpatient Medications:    furosemide (LASIX) 20 MG tablet, Take 1 tablet (20 mg total) by mouth daily as needed for edema., Disp: 10 tablet, Rfl: 0   levothyroxine (SYNTHROID) 25 MCG tablet, TAKE 1 TABLET DAILY, Disp: 90 tablet, Rfl: 3   metoprolol tartrate (LOPRESSOR) 50 MG tablet, TAKE 1 TABLET TWICE A DAY (KEEP UPCOMING APPOINTMENT FOR FUTURE REFILLS), Disp: 60 tablet, Rfl: 11   Polyethyl Glycol-Propyl Glycol (SYSTANE OP), Apply 1 drop to eye daily as needed (dry eyes)., Disp: , Rfl:    triamterene-hydrochlorothiazide (DYAZIDE) 37.5-25 MG capsule, TAKE 1 CAPSULE DAILY (PLEASE KEEP UPCOMING APPOINTMENT FOR FUTURE REFILLS, THANK YOU), Disp: 30 capsule, Rfl: 11   warfarin (COUMADIN) 2.5 MG tablet, TAKE 1 TABLET DAILY EXCEPT TAKE 2 TABLETS ON FRIDAYS, Disp: 90 tablet, Rfl: 3 Social History   Socioeconomic History   Marital status: Widowed    Spouse name: Not on file   Number of children: 3   Years of education: Not on file   Highest education level: High school graduate  Occupational History   Occupation: Retired   Tobacco Use   Smoking status: Never   Smokeless tobacco: Never  Vaping Use   Vaping status: Never Used  Substance and Sexual Activity   Alcohol use: No   Drug use: Never   Sexual activity: Not Currently  Other Topics Concern   Not on file  Social History Narrative   Husband passed 06/29/2021   2 sons live with her   Social Determinants of Health   Financial Resource Strain: Low Risk  (09/16/2022)   Overall Financial Resource Strain (CARDIA)    Difficulty of Paying Living Expenses: Not hard at all  Food Insecurity: No Food Insecurity (09/16/2022)   Hunger Vital Sign    Worried About Running Out of Food in the Last Year: Never true    Ran Out of Food in the Last Year: Never true  Transportation Needs: No Transportation Needs (09/16/2022)   PRAPARE - Administrator, Civil Service (Medical): No    Lack of Transportation (Non-Medical): No  Physical Activity: Insufficiently Active (09/16/2022)   Exercise Vital Sign    Days of Exercise per Week: 3 days    Minutes of Exercise per Session: 30 min  Stress: No Stress Concern Present (09/16/2022)   [name]    Feeling of Stress : Not at all  Social Connections: Moderately Isolated (09/16/2022)   Social Connection and  Isolation Panel [NHANES]    Frequency of Communication with Friends and Family: More than three times a week    Frequency of Social Gatherings with Friends and Family: More than three times a week    Attends Religious Services: More than 4 times per year    Active Member of [employer] or Organizations: No    Attends Banker Meetings: Never    Marital Status: Widowed  Intimate Partner Violence: Not At Risk (09/16/2022)   Humiliation, Afraid, Rape, and Kick questionnaire    Fear of Current or Ex-Partner: No    Emotionally Abused: No    Physically Abused: No    Sexually Abused: No   Family History  Problem Relation Age of Onset   Heart  attack Mother    Heart attack Father    Parkinson's disease Son     Objective: Office vital signs reviewed. BP 131/68   Pulse 69   Temp 98.5 F (36.9 C)   Ht 5\' 8"  (1.727 m)   Wt 136 lb (61.7 kg)   SpO2 99%   BMI 20.68 kg/m   Physical Examination:  General: Awake, alert, chronically ill-appearing female, No acute distress HEENT: sclera white, MMM Cardio: regular rate and rhythm, S1S2 heard, no murmurs appreciated Pulm: clear to auscultation bilaterally, no wheezes, rhonchi or rales; normal work of breathing on room air  Assessment/ Plan: 89 y.o. female   Paroxysmal atrial fibrillation (HCC)  Anticoagulation monitoring, INR range 2-3 - Plan: CoaguChek XS/INR Waived  INR therapeutic.  No changes needed.  Plan to follow-up in 8 weeks, sooner if concerns arise   Melissa Karan Hulen Skains, DO [facility] [phone]

## 2023-04-04 NOTE — Telephone Encounter (Signed)
Letter sent 04/04/2023.

## 2023-04-20 ENCOUNTER — Ambulatory Visit
Admission: RE | Admit: 2023-04-20 | Discharge: 2023-04-20 | Disposition: A | Discharge status: Home / Self Care | Payer: MEDICARE | Source: Ambulatory Visit | Attending: Surgery | Admitting: Surgery

## 2023-04-20 DIAGNOSIS — N6314 Unspecified lump in the right breast, lower inner quadrant: Secondary | ICD-10-CM

## 2023-04-21 ENCOUNTER — Other Ambulatory Visit: Payer: Self-pay | Admitting: Surgery

## 2023-04-21 DIAGNOSIS — N6310 Unspecified lump in the right breast, unspecified quadrant: Secondary | ICD-10-CM

## 2023-04-21 DIAGNOSIS — R599 Enlarged lymph nodes, unspecified: Secondary | ICD-10-CM

## 2023-04-27 ENCOUNTER — Ambulatory Visit (INDEPENDENT_AMBULATORY_CARE_PROVIDER_SITE_OTHER): Payer: MEDICARE

## 2023-04-27 DIAGNOSIS — I495 Sick sinus syndrome: Secondary | ICD-10-CM

## 2023-04-28 LAB — CUP PACEART REMOTE DEVICE CHECK
Battery Remaining Longevity: 5 mo
Battery Voltage: 2.76 V
Brady Statistic AP VP Percent: 1.47 %
Brady Statistic AP VS Percent: 71.92 %
Brady Statistic AS VP Percent: 0.4 %
Brady Statistic AS VS Percent: 26.2 %
Brady Statistic RA Percent Paced: 74.71 %
Brady Statistic RV Percent Paced: 2.57 %
Date Time Interrogation Session: 20241030161431
Implantable Lead Connection Status: 753985
Implantable Lead Connection Status: 753985
Implantable Lead Implant Date: 20081126
Implantable Lead Implant Date: 20081126
Implantable Lead Location: 753859
Implantable Lead Location: 753860
Implantable Lead Model: 5076
Implantable Lead Model: 5076
Implantable Pulse Generator Implant Date: 20180228
Lead Channel Impedance Value: 323 Ohm
Lead Channel Impedance Value: 342 Ohm
Lead Channel Impedance Value: 342 Ohm
Lead Channel Impedance Value: 380 Ohm
Lead Channel Pacing Threshold Amplitude: 1.125 V
Lead Channel Pacing Threshold Pulse Width: 0.4 ms
Lead Channel Sensing Intrinsic Amplitude: 0.125 mV
Lead Channel Sensing Intrinsic Amplitude: 0.125 mV
Lead Channel Sensing Intrinsic Amplitude: 7 mV
Lead Channel Sensing Intrinsic Amplitude: 7 mV
Lead Channel Setting Pacing Amplitude: 2.75 V
Lead Channel Setting Pacing Amplitude: 3.5 V
Lead Channel Setting Pacing Pulse Width: 0.4 ms
Lead Channel Setting Sensing Sensitivity: 2 mV
Zone Setting Status: 755011
Zone Setting Status: 755011

## 2023-05-16 NOTE — Progress Notes (Signed)
Remote pacemaker transmission.   

## 2023-05-18 ENCOUNTER — Encounter: Payer: Self-pay | Admitting: Family Medicine

## 2023-05-18 ENCOUNTER — Ambulatory Visit: Payer: MEDICARE | Admitting: Family Medicine

## 2023-05-18 VITALS — BP 113/63 | HR 84 | Temp 97.3°F | Resp 20

## 2023-05-18 DIAGNOSIS — S81812A Laceration without foreign body, left lower leg, initial encounter: Secondary | ICD-10-CM | POA: Diagnosis not present

## 2023-05-18 DIAGNOSIS — L03116 Cellulitis of left lower limb: Secondary | ICD-10-CM | POA: Diagnosis not present

## 2023-05-18 DIAGNOSIS — R600 Localized edema: Secondary | ICD-10-CM

## 2023-05-18 DIAGNOSIS — Z23 Encounter for immunization: Secondary | ICD-10-CM

## 2023-05-18 MED ORDER — CEPHALEXIN 500 MG PO CAPS
500.0000 mg | ORAL_CAPSULE | Freq: Four times a day (QID) | ORAL | 0 refills | Status: AC
Start: 2023-05-18 — End: 2023-05-25

## 2023-05-18 MED ORDER — FUROSEMIDE 20 MG PO TABS: 20.0000 mg | ORAL_TABLET | Freq: Every day | ORAL | 0 refills | Status: DC

## 2023-05-18 NOTE — Addendum Note (Signed)
Addended by: Quay Burow on: 05/18/2023 03:16 PM   Modules accepted: Orders

## 2023-05-18 NOTE — Progress Notes (Signed)
Subjective:  Patient ID: [name], female    DOB: [birth year], 89 y.o.   MRN: [id number]  Patient Care Team: [name], DO as PCP - General (Family Medicine) Randa Spike, Kelton Pillar, LCSW as [facility] Care Management (Licensed Clinical Social Worker) Croitoru, Rachelle Hora, MD as Consulting Physician (Cardiology) Chrystie Nose, MD as Consulting Physician (Cardiology)   Chief Complaint:  No chief complaint on file.   HPI: [name] is a 89 y.o. female presenting on 05/18/2023 for No chief complaint on file.   Discussed the use of AI scribe software for clinical note transcription with the patient, who gave verbal consent to proceed.  History of Present Illness   The patient presents with a significantly swollen and red leg, which she attributes to a recent incident where a metal toenail trimmer fell onto her leg. She reports that the leg was not initially red or swollen, but it began to weep and subsequently developed the current symptoms. The patient experiences pain in the affected leg. She has a history of leg swelling, but the current condition is markedly worse.  The patient lives alone and manages her own medications, which include Coumadin, Dyazide, and Lopressor. She was previously prescribed Lasix for leg swelling, but she has not been taking it recently.  She has been soaking the affected leg in Epsom salt for relief. The patient has not had a recent tetanus shot.        Relevant past medical, surgical, family, and social history reviewed and updated as indicated.  Allergies and medications reviewed and updated. Data reviewed: Chart in Epic.   Past Medical History:  Diagnosis Date   A-fib (HCC)    Dyslipidemia    Hyperlipidemia    Hypothyroidism    2 degree amidarone    Pacemaker    PAF (paroxysmal atrial fibrillation) (HCC)    S/P mitral valve repair    SSS (sick sinus syndrome) (HCC)    Systemic hypertension     Past Surgical  History:  Procedure Laterality Date   ABDOMINAL HYSTERECTOMY  1989   CARDIAC CATHETERIZATION  01/11/2008   mild-mod pulm. hypertension,normal coronaries   MINIMALLY INVASIVE MAZE PROCEDURE  02/2008   MITRAL VALVE REPAIR  September 2009   [facility] ring annuloplasty   NM MYOVIEW LTD  03/28/2007   no ischemia   PACEMAKER PLACEMENT  November 2008   PPM GENERATOR CHANGEOUT N/A 08/25/2016   Procedure: PPM Generator Changeout - Battery Only;  Surgeon: Thurmon Fair, MD;  Location: [facility];  Service: Cardiovascular;  Laterality: N/A;   US ECHOCARDIOGRAPHY  03/07/2012   mild LVH,EF =>55%,mild MR,mod TR,trace AI,right atrium mod. dilated    Social History   Socioeconomic History   Marital status: Widowed    Spouse name: Not on file   Number of children: 3   Years of education: Not on file   Highest education level: High school graduate  Occupational History   Occupation: Retired  Tobacco Use   Smoking status: Never   Smokeless tobacco: Never  Vaping Use   Vaping status: Never Used  Substance and Sexual Activity   Alcohol use: No   Drug use: Never   Sexual activity: Not Currently  Other Topics Concern   Not on file  Social History Narrative   Husband passed 06/29/2021   2 sons live with her   Social Determinants of Health   Financial Resource Strain: Low Risk  (09/16/2022)   Overall Financial  Resource Strain (CARDIA)    Difficulty of Paying Living Expenses: Not hard at all  Food Insecurity: No Food Insecurity (09/16/2022)   Hunger Vital Sign    Worried About Running Out of Food in the Last Year: Never true    Ran Out of Food in the Last Year: Never true  Transportation Needs: No Transportation Needs (09/16/2022)   PRAPARE - Administrator, Civil Service (Medical): No    Lack of Transportation (Non-Medical): No  Physical Activity: Insufficiently Active (09/16/2022)   Exercise Vital Sign    Days of Exercise per Week: 3 days    Minutes of Exercise per  Session: 30 min  Stress: No Stress Concern Present (09/16/2022)   [name]    Feeling of Stress : Not at all  Social Connections: Moderately Isolated (09/16/2022)   Social Connection and Isolation Panel [NHANES]    Frequency of Communication with Friends and Family: More than three times a week    Frequency of Social Gatherings with Friends and Family: More than three times a week    Attends Religious Services: More than 4 times per year    Active Member of [employer] or Organizations: No    Attends Banker Meetings: Never    Marital Status: Widowed  Intimate Partner Violence: Not At Risk (09/16/2022)   Humiliation, Afraid, Rape, and Kick questionnaire    Fear of Current or Ex-Partner: No    Emotionally Abused: No    Physically Abused: No    Sexually Abused: No    Outpatient Encounter Medications as of 05/18/2023  Medication Sig   cephALEXin (KEFLEX) 500 MG capsule Take 1 capsule (500 mg total) by mouth 4 (four) times daily for 7 days.   furosemide (LASIX) 20 MG tablet Take 1 tablet (20 mg total) by mouth daily for 3 days.   levothyroxine (SYNTHROID) 25 MCG tablet TAKE 1 TABLET DAILY   warfarin (COUMADIN) 2.5 MG tablet TAKE 1 TABLET DAILY EXCEPT TAKE 2 TABLETS ON FRIDAYS   metoprolol tartrate (LOPRESSOR) 50 MG tablet TAKE 1 TABLET TWICE A DAY (KEEP UPCOMING APPOINTMENT FOR FUTURE REFILLS) (Patient not taking: Reported on 05/18/2023)   Polyethyl Glycol-Propyl Glycol (SYSTANE OP) Apply 1 drop to eye daily as needed (dry eyes). (Patient not taking: Reported on 05/18/2023)   triamterene-hydrochlorothiazide (DYAZIDE) 37.5-25 MG capsule TAKE 1 CAPSULE DAILY (PLEASE KEEP UPCOMING APPOINTMENT FOR FUTURE REFILLS, THANK YOU)   [DISCONTINUED] furosemide (LASIX) 20 MG tablet Take 1 tablet (20 mg total) by mouth daily as needed for edema. (Patient not taking: Reported on 05/18/2023)   No facility-administered encounter  medications on file as of 05/18/2023.    Allergies  Allergen Reactions   Statins Other (See Comments)    Myalgia    Pertinent ROS per HPI, otherwise unremarkable      Objective:  BP 113/63   Pulse 84   Temp (!) 97.3 F (36.3 C) (Oral)   Resp 20   SpO2 98%    Wt Readings from Last 3 Encounters:  04/04/23 136 lb (61.7 kg)  03/22/23 136 lb (61.7 kg)  03/15/23 136 lb (61.7 kg)    Physical Exam Vitals and nursing note reviewed.  Constitutional:      General: She is not in acute distress.    Appearance: She is not toxic-appearing or diaphoretic.     Comments: Frail elderly  HENT:     Head: Normocephalic and atraumatic.     Mouth/Throat:  Mouth: Mucous membranes are moist.     Pharynx: Oropharynx is clear.  Eyes:     Conjunctiva/sclera: Conjunctivae normal.     Pupils: Pupils are equal, round, and reactive to light.  Cardiovascular:     Rate and Rhythm: Normal rate. Rhythm irregularly irregular.     Heart sounds: Normal heart sounds. No murmur heard. Pulmonary:     Effort: Pulmonary effort is normal.     Breath sounds: Normal breath sounds.  Musculoskeletal:     Cervical back: Neck supple.     Left lower leg: 3+ Edema present.  Skin:    General: Skin is warm.     Capillary Refill: Capillary refill takes less than 2 seconds.          Comments: LLE: small wound with surrounding erythema, swelling, weeping. Image below.   Neurological:     General: No focal deficit present.     Mental Status: She is alert. Mental status is at baseline.     Motor: Weakness (generalized weakness) present.     Gait: Gait abnormal (in wheelchair).  Psychiatric:        Mood and Affect: Mood normal.        Behavior: Behavior normal.      Results for orders placed or performed in visit on 04/27/23  CUP PACEART REMOTE DEVICE CHECK  Result Value Ref Range   Date Time Interrogation Session [id number]    Pulse Generator Manufacturer MERM    Pulse Gen Model [id number] XT DR  MRI    Pulse Gen Serial Number [id number]    Clinic Name [facility]    Implantable Pulse Generator Type Implantable Pulse Generator    Implantable Pulse Generator Implant Date 19147829    Implantable Lead Manufacturer MERM    Implantable Lead Model 5076 CapSureFix Novus    Implantable Lead Serial Number [id number]    Implantable Lead Implant Date 84696295    Implantable Lead Location Detail 1 APPENDAGE    Implantable Lead Location [id number]    Implantable Lead Connection Status L088196    Implantable Lead Manufacturer MERM    Implantable Lead Model 5076 CapSureFix Novus    Implantable Lead Serial Number [id number]    Implantable Lead Implant Date 28413244    Implantable Lead Location Detail 1 APEX    Implantable Lead Location [id number]    Implantable Lead Connection Status L088196    Lead Channel Setting Sensing Sensitivity 2 mV   Lead Channel Setting Pacing Amplitude 3.5 V   Lead Channel Setting Pacing Pulse Width 0.4 ms   Lead Channel Setting Pacing Amplitude 2.75 V   Zone Setting Status 755011    Zone Setting Status [phone]    Lead Channel Impedance Value 380 ohm   Lead Channel Impedance Value 342 ohm   Lead Channel Sensing Intrinsic Amplitude 0.125 mV   Lead Channel Sensing Intrinsic Amplitude 0.125 mV   Lead Channel Impedance Value 342 ohm   Lead Channel Impedance Value 323 ohm   Lead Channel Sensing Intrinsic Amplitude 7 mV   Lead Channel Sensing Intrinsic Amplitude 7 mV   Lead Channel Pacing Threshold Amplitude 1.125 V   Lead Channel Pacing Threshold Pulse Width 0.4 ms   Battery Status OK    Battery Remaining Longevity 5 mo   Battery Voltage 2.76 V   Brady Statistic RA Percent Paced 74.71 %   Brady Statistic RV Percent Paced 2.57 %   Brady Statistic AP VP Percent 1.47 %   Brady Statistic AS VP Percent  0.4 %   Brady Statistic AP VS Percent 71.92 %   Brady Statistic AS VS Percent 26.2 %       Pertinent labs & imaging results that were available during my care of the  patient were reviewed by me and considered in my medical decision making.  Assessment & Plan:  Diagnoses and all orders for this visit:  Cellulitis of left lower extremity -     cephALEXin (KEFLEX) 500 MG capsule; Take 1 capsule (500 mg total) by mouth 4 (four) times daily for 7 days.  Laceration of left lower leg, initial encounter  Edema of left lower leg -     furosemide (LASIX) 20 MG tablet; Take 1 tablet (20 mg total) by mouth daily for 3 days.     Assessment and Plan    Cellulitis Acute cellulitis of the leg, likely secondary to trauma from a metal toenail trimmer. The area is red, swollen, and weeping, indicating significant infection. Discussed infection risk due to bacteria from the toenail trimmer, benefits of applying an Unna boot to reduce swelling and inflammation, and necessity of oral antibiotics. Informed about the need for a tetanus vaccine due to the metal injury. - Apply Unna boot to the affected leg - Prescribe oral antibiotics - Administer tetanus vaccine - Schedule follow-up appointment in one week to assess the Unna boot and infection status  Chronic Leg Edema Chronic leg edema, likely exacerbated by underlying conditions and lack of consistent diuretic therapy. Currently not on Lasix due to lack of prescription. Discussed short-term use of Lasix to manage acute swelling. - Prescribe a short course of Lasix (furosemide) for 2-3 days to manage acute swelling  Follow-up - Schedule follow-up appointment in one week.    Will need to repeat INR at follow up visit due to antibiotic use. Repeat BMP as well.     Continue all other maintenance medications.  Follow up plan: Return in about 1 week (around 05/25/2023), or if symptoms worsen or fail to improve, for cellulitis LLE.   Continue healthy lifestyle choices, including diet (rich in fruits, vegetables, and lean proteins, and low in salt and simple carbohydrates) and exercise (at least 30 minutes of moderate  physical activity daily).  Educational handout given for [employer]  The above assessment and management plan was discussed with the patient. The patient verbalized understanding of and has agreed to the management plan. Patient is aware to call the clinic if they develop any new symptoms or if symptoms persist or worsen. Patient is aware when to return to the clinic for a follow-up visit. Patient educated on when it is appropriate to go to the emergency department.   Kari Baars, FNP-C [facility] [phone]

## 2023-05-25 ENCOUNTER — Encounter: Payer: Self-pay | Admitting: Family Medicine

## 2023-05-25 ENCOUNTER — Ambulatory Visit: Payer: MEDICARE | Admitting: Family Medicine

## 2023-05-25 VITALS — BP 133/86 | HR 88 | Temp 98.5°F | Ht 68.0 in

## 2023-05-25 DIAGNOSIS — L03116 Cellulitis of left lower limb: Secondary | ICD-10-CM

## 2023-05-25 DIAGNOSIS — I480 Paroxysmal atrial fibrillation: Secondary | ICD-10-CM

## 2023-05-25 DIAGNOSIS — Z7901 Long term (current) use of anticoagulants: Secondary | ICD-10-CM

## 2023-05-25 MED ORDER — DOXYCYCLINE HYCLATE 100 MG PO TABS: 100.0000 mg | ORAL_TABLET | Freq: Two times a day (BID) | ORAL | 0 refills | Status: AC

## 2023-05-25 NOTE — Progress Notes (Signed)
Subjective: CC:f/u cellulitis PCP: [name], DO EVO:JJKKX [name] is a 89 y.o. female presenting to clinic today for:  1. Cellulitis, left Was started on keflex, lasix burst and UNNA boot applied due to swelling/ edema w/ secondary infection.  She is here for 1 week follow up and repeat INR testing.  She notes improvement in swelling.  She has had no fevers.  No unusual bleeding.   ROS: Per HPI  Allergies  Allergen Reactions   Statins Other (See Comments)    Myalgia   Past Medical History:  Diagnosis Date   A-fib (HCC)    Dyslipidemia    Hyperlipidemia    Hypothyroidism    2 degree amidarone    Pacemaker    PAF (paroxysmal atrial fibrillation) (HCC)    S/P mitral valve repair    SSS (sick sinus syndrome) (HCC)    Systemic hypertension     Current Outpatient Medications:    cephALEXin (KEFLEX) 500 MG capsule, Take 1 capsule (500 mg total) by mouth 4 (four) times daily for 7 days., Disp: 28 capsule, Rfl: 0   doxycycline (VIBRA-TABS) 100 MG tablet, Take 1 tablet (100 mg total) by mouth 2 (two) times daily for 10 days. Start after done with other antibiotic, Disp: 20 tablet, Rfl: 0   levothyroxine (SYNTHROID) 25 MCG tablet, TAKE 1 TABLET DAILY, Disp: 90 tablet, Rfl: 3   metoprolol tartrate (LOPRESSOR) 50 MG tablet, TAKE 1 TABLET TWICE A DAY (KEEP UPCOMING APPOINTMENT FOR FUTURE REFILLS), Disp: 60 tablet, Rfl: 11   Polyethyl Glycol-Propyl Glycol (SYSTANE OP), Apply 1 drop to eye daily as needed (dry eyes)., Disp: , Rfl:    triamterene-hydrochlorothiazide (DYAZIDE) 37.5-25 MG capsule, TAKE 1 CAPSULE DAILY (PLEASE KEEP UPCOMING APPOINTMENT FOR FUTURE REFILLS, THANK YOU), Disp: 30 capsule, Rfl: 11   warfarin (COUMADIN) 2.5 MG tablet, TAKE 1 TABLET DAILY EXCEPT TAKE 2 TABLETS ON FRIDAYS, Disp: 90 tablet, Rfl: 3   furosemide (LASIX) 20 MG tablet, Take 1 tablet (20 mg total) by mouth daily for 3 days., Disp: 3 tablet, Rfl: 0 Social History   Socioeconomic History    Marital status: Widowed    Spouse name: Not on file   Number of children: 3   Years of education: Not on file   Highest education level: High school graduate  Occupational History   Occupation: Retired  Tobacco Use   Smoking status: Never   Smokeless tobacco: Never  Vaping Use   Vaping status: Never Used  Substance and Sexual Activity   Alcohol use: No   Drug use: Never   Sexual activity: Not Currently  Other Topics Concern   Not on file  Social History Narrative   Husband passed 06/29/2021   2 sons live with her   Social Determinants of Health   Financial Resource Strain: Low Risk  (09/16/2022)   Overall Financial Resource Strain (CARDIA)    Difficulty of Paying Living Expenses: Not hard at all  Food Insecurity: No Food Insecurity (09/16/2022)   Hunger Vital Sign    Worried About Running Out of Food in the Last Year: Never true    Ran Out of Food in the Last Year: Never true  Transportation Needs: No Transportation Needs (09/16/2022)   PRAPARE - Administrator, Civil Service (Medical): No    Lack of Transportation (Non-Medical): No  Physical Activity: Insufficiently Active (09/16/2022)   Exercise Vital Sign    Days of Exercise per Week: 3 days    Minutes of  Exercise per Session: 30 min  Stress: No Stress Concern Present (09/16/2022)   [name]    Feeling of Stress : Not at all  Social Connections: Moderately Isolated (09/16/2022)   Social Connection and Isolation Panel [NHANES]    Frequency of Communication with Friends and Family: More than three times a week    Frequency of Social Gatherings with Friends and Family: More than three times a week    Attends Religious Services: More than 4 times per year    Active Member of [employer] or Organizations: No    Attends Banker Meetings: Never    Marital Status: Widowed  Intimate Partner Violence: Not At Risk (09/16/2022)   Humiliation,  Afraid, Rape, and Kick questionnaire    Fear of Current or Ex-Partner: No    Emotionally Abused: No    Physically Abused: No    Sexually Abused: No   Family History  Problem Relation Age of Onset   Heart attack Mother    Heart attack Father    Parkinson's disease Son     Objective: Office vital signs reviewed. BP 133/86   Pulse 88   Temp 98.5 F (36.9 C)   Ht 5\' 8"  (1.727 m)   SpO2 96%   BMI 20.68 kg/m   Physical Examination:  General: Awake, alert, well nourished, No acute distress Skin: Left lower extremity with moderate edema, erythema.  There is warmth.  No purulence.  No significant drainage on exam.    Assessment/ Plan: 89 y.o. female   Cellulitis of left lower extremity - Plan: doxycycline (VIBRA-TABS) 100 MG tablet  Paroxysmal atrial fibrillation (HCC) - Plan: CANCELED: CoaguChek XS/INR Waived, CANCELED: Hemoglobin, fingerstick  Anticoagulation monitoring, INR range 2-3 - Plan: CANCELED: CoaguChek XS/INR Waived, CANCELED: Hemoglobin, fingerstick  Leg still looks pretty angry I am going to switch her from Keflex to doxycycline to give better staph coverage.  Though there was no purulence on exam.  She was rewrapped in the [employer] and I will see her back closely on Tuesday.  We discussed that she will need INR check at that time due to doxycycline likely interfering with her current medication.  She unfortunately did not stop to get INR checked today prior to discharge   [name], DO [facility] [phone]

## 2023-05-30 ENCOUNTER — Encounter: Payer: Self-pay | Admitting: Family Medicine

## 2023-05-30 ENCOUNTER — Ambulatory Visit (INDEPENDENT_AMBULATORY_CARE_PROVIDER_SITE_OTHER): Payer: MEDICARE | Admitting: Family Medicine

## 2023-05-30 VITALS — BP 127/72 | HR 100 | Temp 98.8°F | Ht 68.0 in | Wt 136.0 lb

## 2023-05-30 DIAGNOSIS — I480 Paroxysmal atrial fibrillation: Secondary | ICD-10-CM | POA: Diagnosis not present

## 2023-05-30 DIAGNOSIS — Z7901 Long term (current) use of anticoagulants: Secondary | ICD-10-CM

## 2023-05-30 DIAGNOSIS — E039 Hypothyroidism, unspecified: Secondary | ICD-10-CM | POA: Diagnosis not present

## 2023-05-30 DIAGNOSIS — B351 Tinea unguium: Secondary | ICD-10-CM | POA: Diagnosis not present

## 2023-05-30 DIAGNOSIS — L03116 Cellulitis of left lower limb: Secondary | ICD-10-CM | POA: Diagnosis not present

## 2023-05-30 LAB — COAGUCHEK XS/INR WAIVED
INR: 2.5 — ABNORMAL HIGH (ref 0.9–1.1)
Prothrombin Time: 29.8 s

## 2023-05-30 NOTE — Progress Notes (Signed)
Subjective: [id number] cellulitis PCP: [name], DO NWG:NFAOZ H Wrice is a 89 y.o. female presenting to clinic today for:  1.  Cellulitis Patient reports improvement in the left lower extremity cellulitis and swelling.  The warmth has gone away.  Erythema resolving.  Still has about 3 days of doxycycline left.  Leg still kind of swollen but better than before  2.  Atrial fibrillation Compliant with medications.  No bleeding reported.  3.  Hypothyroidism Compliant with medications.  No reports of tremor or heart palpitations.  Has swelling as above    ROS: Per HPI  Allergies  Allergen Reactions   Statins Other (See Comments)    Myalgia   Past Medical History:  Diagnosis Date   A-fib (HCC)    Dyslipidemia    Hyperlipidemia    Hypothyroidism    2 degree amidarone    Pacemaker    PAF (paroxysmal atrial fibrillation) (HCC)    S/P mitral valve repair    SSS (sick sinus syndrome) (HCC)    Systemic hypertension     Current Outpatient Medications:    doxycycline (VIBRA-TABS) 100 MG tablet, Take 1 tablet (100 mg total) by mouth 2 (two) times daily for 10 days. Start after done with other antibiotic, Disp: 20 tablet, Rfl: 0   furosemide (LASIX) 20 MG tablet, Take 1 tablet (20 mg total) by mouth daily for 3 days., Disp: 3 tablet, Rfl: 0   levothyroxine (SYNTHROID) 25 MCG tablet, TAKE 1 TABLET DAILY, Disp: 90 tablet, Rfl: 3   metoprolol tartrate (LOPRESSOR) 50 MG tablet, TAKE 1 TABLET TWICE A DAY (KEEP UPCOMING APPOINTMENT FOR FUTURE REFILLS), Disp: 60 tablet, Rfl: 11   Polyethyl Glycol-Propyl Glycol (SYSTANE OP), Apply 1 drop to eye daily as needed (dry eyes)., Disp: , Rfl:    triamterene-hydrochlorothiazide (DYAZIDE) 37.5-25 MG capsule, TAKE 1 CAPSULE DAILY (PLEASE KEEP UPCOMING APPOINTMENT FOR FUTURE REFILLS, THANK YOU), Disp: 30 capsule, Rfl: 11   warfarin (COUMADIN) 2.5 MG tablet, TAKE 1 TABLET DAILY EXCEPT TAKE 2 TABLETS ON FRIDAYS, Disp: 90 tablet, Rfl:  3 Social History   Socioeconomic History   Marital status: Widowed    Spouse name: Not on file   Number of children: 3   Years of education: Not on file   Highest education level: High school graduate  Occupational History   Occupation: Retired  Tobacco Use   Smoking status: Never   Smokeless tobacco: Never  Vaping Use   Vaping status: Never Used  Substance and Sexual Activity   Alcohol use: No   Drug use: Never   Sexual activity: Not Currently  Other Topics Concern   Not on file  Social History Narrative   Husband passed 06/29/2021   2 sons live with her   Social Determinants of Health   Financial Resource Strain: Low Risk  (09/16/2022)   Overall Financial Resource Strain (CARDIA)    Difficulty of Paying Living Expenses: Not hard at all  Food Insecurity: No Food Insecurity (09/16/2022)   Hunger Vital Sign    Worried About Running Out of Food in the Last Year: Never true    Ran Out of Food in the Last Year: Never true  Transportation Needs: No Transportation Needs (09/16/2022)   PRAPARE - Administrator, Civil Service (Medical): No    Lack of Transportation (Non-Medical): No  Physical Activity: Insufficiently Active (09/16/2022)   Exercise Vital Sign    Days of Exercise per Week: 3 days    Minutes of Exercise  per Session: 30 min  Stress: No Stress Concern Present (09/16/2022)   [name]    Feeling of Stress : Not at all  Social Connections: Moderately Isolated (09/16/2022)   Social Connection and Isolation Panel [NHANES]    Frequency of Communication with Friends and Family: More than three times a week    Frequency of Social Gatherings with Friends and Family: More than three times a week    Attends Religious Services: More than 4 times per year    Active Member of [employer] or Organizations: No    Attends Banker Meetings: Never    Marital Status: Widowed  Intimate Partner Violence:  Not At Risk (09/16/2022)   Humiliation, Afraid, Rape, and Kick questionnaire    Fear of Current or Ex-Partner: No    Emotionally Abused: No    Physically Abused: No    Sexually Abused: No   Family History  Problem Relation Age of Onset   Heart attack Mother    Heart attack Father    Parkinson's disease Son     Objective: Office vital signs reviewed. BP 127/72   Pulse 100   Temp 98.8 F (37.1 C)   Ht 5\' 8"  (1.727 m)   Wt 136 lb (61.7 kg)   SpO2 95%   BMI 20.68 kg/m   Physical Examination:  General: Awake, alert, well nourished, No acute distress HEENT: sclera white, MMM Cardio: regular rate   Pulm:  normal work of breathing on room air Extremities: warm, well perfused.  +1 pitting edema in the left lower extremity, trace to +1 in the right lower extremity.  Minimal erythema in the left lower extremity.  Minimal increased warmth along the medial aspect of the lower leg.  No exudates or skin breakdown.  No weeping.  Remains somewhat tender to touch.  Left tip of left gt toe surgically absent MSK: Arrives in wheelchair  Assessment/ Plan: 89 y.o. female   Paroxysmal atrial fibrillation (HCC) - Plan: CoaguChek XS/INR Waived, CBC  Anticoagulation monitoring, INR range 2-3 - Plan: CoaguChek XS/INR Waived, CBC  Cellulitis of left lower extremity - Plan: CBC, CMP14+EGFR  Acquired hypothyroidism - Plan: TSH, T4, Free  Infection looks to be resolving.  INR is therapeutic at 2.5 today.  I am going to place referral to podiatry for nail care.  Continue doxycycline as directed.  Unna boot renewed and may remove in 3 days at which point I would like her to start using her compression hose.  We discussed today that if symptoms are worsening or simply not improving, neck step will be eval with vascular specialist and/or ABI  Check thyroid levels   Jerianne Anselmo Hulen Skains, DO [facility] [phone]

## 2023-05-30 NOTE — Patient Instructions (Signed)
Continue coumadin as you have been. Your level is appropriate. Unna boot for 3 days then remove.  Use compression hose to keep swelling down Let me know if getting worse again.

## 2023-05-31 ENCOUNTER — Ambulatory Visit (INDEPENDENT_AMBULATORY_CARE_PROVIDER_SITE_OTHER): Payer: MEDICARE

## 2023-05-31 ENCOUNTER — Other Ambulatory Visit: Payer: Self-pay | Admitting: Family Medicine

## 2023-05-31 DIAGNOSIS — I495 Sick sinus syndrome: Secondary | ICD-10-CM | POA: Diagnosis not present

## 2023-05-31 DIAGNOSIS — D649 Anemia, unspecified: Secondary | ICD-10-CM

## 2023-05-31 LAB — CUP PACEART REMOTE DEVICE CHECK
Battery Remaining Longevity: 5 mo
Battery Voltage: 2.73 V
Brady Statistic AP VP Percent: 2.54 %
Brady Statistic AP VS Percent: 69.97 %
Brady Statistic AS VP Percent: 0.43 %
Brady Statistic AS VS Percent: 27.05 %
Brady Statistic RA Percent Paced: 74.32 %
Brady Statistic RV Percent Paced: 3.18 %
Date Time Interrogation Session: 20241203004554
Implantable Lead Connection Status: 753985
Implantable Lead Connection Status: 753985
Implantable Lead Implant Date: 20081126
Implantable Lead Implant Date: 20081126
Implantable Lead Location: 753859
Implantable Lead Location: 753860
Implantable Lead Model: 5076
Implantable Lead Model: 5076
Implantable Pulse Generator Implant Date: 20180228
Lead Channel Impedance Value: 304 Ohm
Lead Channel Impedance Value: 323 Ohm
Lead Channel Impedance Value: 342 Ohm
Lead Channel Impedance Value: 380 Ohm
Lead Channel Pacing Threshold Amplitude: 1.125 V
Lead Channel Pacing Threshold Pulse Width: 0.4 ms
Lead Channel Sensing Intrinsic Amplitude: 0.125 mV
Lead Channel Sensing Intrinsic Amplitude: 0.125 mV
Lead Channel Sensing Intrinsic Amplitude: 5.625 mV
Lead Channel Sensing Intrinsic Amplitude: 5.625 mV
Lead Channel Setting Pacing Amplitude: 3 V
Lead Channel Setting Pacing Amplitude: 3.5 V
Lead Channel Setting Pacing Pulse Width: 0.4 ms
Lead Channel Setting Sensing Sensitivity: 2 mV
Zone Setting Status: 755011
Zone Setting Status: 755011

## 2023-05-31 LAB — CMP14+EGFR
ALT: 8 [IU]/L (ref 0–32)
AST: 19 [IU]/L (ref 0–40)
Albumin: 3.6 g/dL (ref 3.6–4.6)
Alkaline Phosphatase: 76 [IU]/L (ref 44–121)
BUN/Creatinine Ratio: 23 (ref 12–28)
BUN: 25 mg/dL (ref 10–36)
Bilirubin Total: 0.3 mg/dL (ref 0.0–1.2)
CO2: 25 mmol/L (ref 20–29)
Calcium: 9.4 mg/dL (ref 8.7–10.3)
Chloride: 101 mmol/L (ref 96–106)
Creatinine, Ser: 1.08 mg/dL — ABNORMAL HIGH (ref 0.57–1.00)
Globulin, Total: 2.4 g/dL (ref 1.5–4.5)
Glucose: 143 mg/dL — ABNORMAL HIGH (ref 70–99)
Potassium: 3.8 mmol/L (ref 3.5–5.2)
Sodium: 138 mmol/L (ref 134–144)
Total Protein: 6 g/dL (ref 6.0–8.5)
eGFR: 48 mL/min/{1.73_m2} — ABNORMAL LOW (ref >59)

## 2023-05-31 LAB — TSH: TSH: 3.62 u[IU]/mL (ref 0.450–4.500)

## 2023-05-31 LAB — CBC
Hematocrit: 32.8 % — ABNORMAL LOW (ref 34.0–46.6)
Hemoglobin: 10.4 g/dL — ABNORMAL LOW (ref 11.1–15.9)
MCH: 29.1 pg (ref 26.6–33.0)
MCHC: 31.7 g/dL (ref 31.5–35.7)
MCV: 92 fL (ref 79–97)
Platelets: 395 10*3/uL (ref 150–450)
RBC: 3.57 x10E6/uL — ABNORMAL LOW (ref 3.77–5.28)
RDW: 13.1 % (ref 11.7–15.4)
WBC: 5.5 10*3/uL (ref 3.4–10.8)

## 2023-05-31 LAB — T4, FREE: Free T4: 0.88 ng/dL (ref 0.82–1.77)

## 2023-06-07 ENCOUNTER — Encounter: Payer: Self-pay | Admitting: *Deleted

## 2023-07-25 ENCOUNTER — Other Ambulatory Visit: Payer: Self-pay | Admitting: Family Medicine

## 2023-07-27 ENCOUNTER — Ambulatory Visit (INDEPENDENT_AMBULATORY_CARE_PROVIDER_SITE_OTHER): Payer: MEDICARE

## 2023-07-27 DIAGNOSIS — I495 Sick sinus syndrome: Secondary | ICD-10-CM | POA: Diagnosis not present

## 2023-07-27 LAB — CUP PACEART REMOTE DEVICE CHECK
Battery Remaining Longevity: 1 mo
Battery Voltage: 2.66 V
Brady Statistic AP VP Percent: 4.9 %
Brady Statistic AP VS Percent: 78.32 %
Brady Statistic AS VP Percent: 0.66 %
Brady Statistic AS VS Percent: 16.12 %
Brady Statistic RA Percent Paced: 83.47 %
Brady Statistic RV Percent Paced: 5.56 %
Date Time Interrogation Session: 20250129003336
Implantable Lead Connection Status: 753985
Implantable Lead Connection Status: 753985
Implantable Lead Implant Date: 20081126
Implantable Lead Implant Date: 20081126
Implantable Lead Location: 753859
Implantable Lead Location: 753860
Implantable Lead Model: 5076
Implantable Lead Model: 5076
Implantable Pulse Generator Implant Date: 20180228
Lead Channel Impedance Value: 304 Ohm
Lead Channel Impedance Value: 342 Ohm
Lead Channel Impedance Value: 342 Ohm
Lead Channel Impedance Value: 380 Ohm
Lead Channel Pacing Threshold Amplitude: 1.125 V
Lead Channel Pacing Threshold Pulse Width: 0.4 ms
Lead Channel Sensing Intrinsic Amplitude: 0.125 mV
Lead Channel Sensing Intrinsic Amplitude: 0.125 mV
Lead Channel Sensing Intrinsic Amplitude: 6.25 mV
Lead Channel Sensing Intrinsic Amplitude: 6.25 mV
Lead Channel Setting Pacing Amplitude: 3 V
Lead Channel Setting Pacing Amplitude: 3.5 V
Lead Channel Setting Pacing Pulse Width: 0.4 ms
Lead Channel Setting Sensing Sensitivity: 2 mV
Zone Setting Status: 755011
Zone Setting Status: 755011

## 2023-07-29 ENCOUNTER — Encounter: Payer: Self-pay | Admitting: Family Medicine

## 2023-07-29 ENCOUNTER — Ambulatory Visit (INDEPENDENT_AMBULATORY_CARE_PROVIDER_SITE_OTHER): Payer: MEDICARE | Admitting: Family Medicine

## 2023-07-29 VITALS — BP 92/42 | HR 70 | Temp 97.6°F | Ht 68.0 in

## 2023-07-29 DIAGNOSIS — R600 Localized edema: Secondary | ICD-10-CM

## 2023-07-29 DIAGNOSIS — I480 Paroxysmal atrial fibrillation: Secondary | ICD-10-CM | POA: Diagnosis not present

## 2023-07-29 DIAGNOSIS — W5503XA Scratched by cat, initial encounter: Secondary | ICD-10-CM | POA: Diagnosis not present

## 2023-07-29 DIAGNOSIS — Z7901 Long term (current) use of anticoagulants: Secondary | ICD-10-CM

## 2023-07-29 DIAGNOSIS — D649 Anemia, unspecified: Secondary | ICD-10-CM

## 2023-07-29 LAB — COAGUCHEK XS/INR WAIVED
INR: 2.7 — ABNORMAL HIGH (ref 0.9–1.1)
Prothrombin Time: 32.2 s

## 2023-07-29 MED ORDER — AMOXICILLIN-POT CLAVULANATE 875-125 MG PO TABS: 1.0000 | ORAL_TABLET | Freq: Two times a day (BID) | ORAL | 0 refills | Status: DC

## 2023-07-29 MED ORDER — LEVOTHYROXINE SODIUM 25 MCG PO TABS: 25.0000 ug | ORAL_TABLET | Freq: Every day | ORAL | 3 refills | Status: DC

## 2023-07-29 NOTE — Patient Instructions (Addendum)
HOLD the triamterene until Monday when the nurse rechecks you. Ok to take the wrap off the leg on Sunday. I'm sending an antibiotic for those cat scratches

## 2023-07-29 NOTE — Progress Notes (Signed)
Subjective: CC: INR check PCP: [name], DO ZOX:WRUEA H Sher is a 89 y.o. female presenting to clinic today for:  1.  Chronic A-fib Patient compliant with medications.  She had a big bleeding episode recently when her cat scratched her legs.  She reports lower extremity edema that is worse than normal.  Compliant with Dyazide.   ROS: Per HPI  Allergies  Allergen Reactions   Statins Other (See Comments)    Myalgia   Past Medical History:  Diagnosis Date   A-fib (HCC)    Dyslipidemia    Hyperlipidemia    Hypothyroidism    2 degree amidarone    Pacemaker    PAF (paroxysmal atrial fibrillation) (HCC)    S/P mitral valve repair    SSS (sick sinus syndrome) (HCC)    Systemic hypertension     Current Outpatient Medications:    metoprolol tartrate (LOPRESSOR) 50 MG tablet, TAKE 1 TABLET TWICE A DAY (KEEP UPCOMING APPOINTMENT FOR FUTURE REFILLS), Disp: 60 tablet, Rfl: 11   Polyethyl Glycol-Propyl Glycol (SYSTANE OP), Apply 1 drop to eye daily as needed (dry eyes)., Disp: , Rfl:    triamterene-hydrochlorothiazide (DYAZIDE) 37.5-25 MG capsule, TAKE 1 CAPSULE DAILY (PLEASE KEEP UPCOMING APPOINTMENT FOR FUTURE REFILLS, THANK YOU), Disp: 30 capsule, Rfl: 11   warfarin (COUMADIN) 2.5 MG tablet, TAKE 1 TABLET DAILY EXCEPT TAKE 2 TABLETS ON FRIDAYS, Disp: 90 tablet, Rfl: 0   furosemide (LASIX) 20 MG tablet, Take 1 tablet (20 mg total) by mouth daily for 3 days., Disp: 3 tablet, Rfl: 0   levothyroxine (SYNTHROID) 25 MCG tablet, Take 1 tablet (25 mcg total) by mouth daily., Disp: 90 tablet, Rfl: 3 Social History   Socioeconomic History   Marital status: Widowed    Spouse name: Not on file   Number of children: 3   Years of education: Not on file   Highest education level: High school graduate  Occupational History   Occupation: Retired  Tobacco Use   Smoking status: Never   Smokeless tobacco: Never  Vaping Use   Vaping status: Never Used  Substance and Sexual  Activity   Alcohol use: No   Drug use: Never   Sexual activity: Not Currently  Other Topics Concern   Not on file  Social History Narrative   Husband passed 06/29/2021   2 sons live with her   Social Drivers of Corporate investment banker Strain: Low Risk  (09/16/2022)   Overall Financial Resource Strain (CARDIA)    Difficulty of Paying Living Expenses: Not hard at all  Food Insecurity: No Food Insecurity (09/16/2022)   Hunger Vital Sign    Worried About Running Out of Food in the Last Year: Never true    Ran Out of Food in the Last Year: Never true  Transportation Needs: No Transportation Needs (09/16/2022)   PRAPARE - Administrator, Civil Service (Medical): No    Lack of Transportation (Non-Medical): No  Physical Activity: Insufficiently Active (09/16/2022)   Exercise Vital Sign    Days of Exercise per Week: 3 days    Minutes of Exercise per Session: 30 min  Stress: No Stress Concern Present (09/16/2022)   [name]    Feeling of Stress : Not at all  Social Connections: Moderately Isolated (09/16/2022)   Social Connection and Isolation Panel [NHANES]    Frequency of Communication with Friends and Family: More than three times a week  Frequency of Social Gatherings with Friends and Family: More than three times a week    Attends Religious Services: More than 4 times per year    Active Member of Clubs or Organizations: No    Attends Banker Meetings: Never    Marital Status: Widowed  Intimate Partner Violence: Not At Risk (09/16/2022)   Humiliation, Afraid, Rape, and Kick questionnaire    Fear of Current or Ex-Partner: No    Emotionally Abused: No    Physically Abused: No    Sexually Abused: No   Family History  Problem Relation Age of Onset   Heart attack Mother    Heart attack Father    Parkinson's disease Son     Objective: Office vital signs reviewed. BP (!) 92/42   Pulse 70    Temp 97.6 F (36.4 C)   Ht 5\' 8"  (1.727 m)   SpO2 98%   BMI 20.68 kg/m   Physical Examination:  General: Awake, alert, nontoxic elderly female, No acute distress HEENT: sclera white, MMM Cardio: regular rate and rhythm, S1S2 heard, no murmurs appreciated Pulm: clear to auscultation bilaterally, no wheezes, rhonchi or rales; normal work of breathing on room air Extremities: Weeping, edematous lower extremities left greater than right.  Right with some erythema and mild warmth.  Left and right LEs with healing abrasion.  Assessment/ Plan: 89 y.o. female   Paroxysmal atrial fibrillation (HCC) - Plan: Anemia Profile B, CoaguChek XS/INR Waived, Fecal occult blood, imunochemical([facility])  Anticoagulation monitoring, INR range 2-3 - Plan: Anemia Profile B  Anemia, unspecified type - Plan: Anemia Profile B  Cat scratch - Plan: amoxicillin-clavulanate (AUGMENTIN) 875-125 MG tablet  Bilateral leg edema - Plan: amoxicillin-clavulanate (AUGMENTIN) 875-125 MG tablet  INR therapeutic.  Continue current regimen.  Check anemia panel, given history of anemia and recent bleeding episode.  FOBT recommended given recent low hemoglobin  I have given her Augmentin to cover for cat scratches.  Her right lower extremity looked a little erythematous.  Bilateral lower extremities were weeping.  Unna boots placed bilaterally.  Because blood pressure is on the low side have asked her to hold her triamterene.  She will have close follow-up on Monday for reevaluation.  Continued beta-blocker given A-fib.  Encouraged elevation of lower extremities.   [name], DO [facility] [phone]

## 2023-07-30 LAB — ANEMIA PROFILE B
Basophils Absolute: 0 10*3/uL (ref 0.0–0.2)
Basos: 0 %
EOS (ABSOLUTE): 0 10*3/uL (ref 0.0–0.4)
Eos: 0 %
Ferritin: 56 ng/mL (ref 15–150)
Folate: 6.1 ng/mL (ref >3.0)
Hematocrit: 31.4 % — ABNORMAL LOW (ref 34.0–46.6)
Hemoglobin: 10.3 g/dL — ABNORMAL LOW (ref 11.1–15.9)
Immature Grans (Abs): 0 10*3/uL (ref 0.0–0.1)
Immature Granulocytes: 0 %
Iron Saturation: 10 % — ABNORMAL LOW (ref 15–55)
Iron: 32 ug/dL (ref 27–139)
Lymphocytes Absolute: 1.5 10*3/uL (ref 0.7–3.1)
Lymphs: 14 %
MCH: 29.5 pg (ref 26.6–33.0)
MCHC: 32.8 g/dL (ref 31.5–35.7)
MCV: 90 fL (ref 79–97)
Monocytes Absolute: 0.5 10*3/uL (ref 0.1–0.9)
Monocytes: 5 %
Neutrophils Absolute: 8.5 10*3/uL — ABNORMAL HIGH (ref 1.4–7.0)
Neutrophils: 81 %
Platelets: 187 10*3/uL (ref 150–450)
RBC: 3.49 x10E6/uL — ABNORMAL LOW (ref 3.77–5.28)
RDW: 14 % (ref 11.7–15.4)
Retic Ct Pct: 1.4 % (ref 0.6–2.6)
Total Iron Binding Capacity: 312 ug/dL (ref 250–450)
UIBC: 280 ug/dL (ref 118–369)
Vitamin B-12: 404 pg/mL (ref 232–1245)
WBC: 10.5 10*3/uL (ref 3.4–10.8)

## 2023-08-01 ENCOUNTER — Ambulatory Visit: Payer: MEDICARE

## 2023-08-01 ENCOUNTER — Encounter: Payer: Self-pay | Admitting: Family Medicine

## 2023-08-01 VITALS — BP 136/73 | HR 78 | Ht 68.0 in | Wt 149.0 lb

## 2023-08-01 DIAGNOSIS — I872 Venous insufficiency (chronic) (peripheral): Secondary | ICD-10-CM | POA: Diagnosis not present

## 2023-08-01 DIAGNOSIS — R600 Localized edema: Secondary | ICD-10-CM | POA: Diagnosis not present

## 2023-08-01 DIAGNOSIS — L98499 Non-pressure chronic ulcer of skin of other sites with unspecified severity: Secondary | ICD-10-CM

## 2023-08-01 DIAGNOSIS — W5503XD Scratched by cat, subsequent encounter: Secondary | ICD-10-CM

## 2023-08-01 DIAGNOSIS — W5503XA Scratched by cat, initial encounter: Secondary | ICD-10-CM

## 2023-08-01 DIAGNOSIS — I480 Paroxysmal atrial fibrillation: Secondary | ICD-10-CM | POA: Diagnosis not present

## 2023-08-01 NOTE — Progress Notes (Unsigned)
BP 136/73   Pulse 78   Ht 5\' 8"  (1.727 m)   Wt 67.6 kg   SpO2 100%   BMI 22.66 kg/m    Subjective:   Patient ID: [name], female    DOB: 09/05/1928, 89 y.o.   MRN: [id number]  HPI: [name] is a 89 y.o. female presenting on 08/01/2023 for Hypertension and Leg Swelling (BLE- unna boot)   Hypertension Pertinent negatives include no chest pain, headaches or shortness of breath.   Re-assessment of Bilateral leg swelling with Unna boot placement and Blood pressure check 3 days ago. During her encounter there was noted edema, weeping and cat scratches on her lower extremity along with her blood pressure reading 92/42. She was instructed to hold Dyazide medication until Monday. She states her legs feel better with no pain and that the swelling looks to be better. She notes a small laceration on left shin and right ankle area. Her son reports her blood pressures at home have been around what they normally are. Denies any fever or chills. Denies any headaches or lightheadedness.   Relevant past medical, surgical, family and social history reviewed and updated as indicated. Interim medical history since our last visit reviewed. Allergies and medications reviewed and updated.  Review of Systems  Constitutional:  Negative for chills and fever.  Respiratory:  Negative for chest tightness and shortness of breath.   Cardiovascular:  Positive for leg swelling. Negative for chest pain.  Musculoskeletal:  Negative for arthralgias and myalgias.  Skin:  Positive for wound. Negative for rash.  Neurological:  Negative for weakness, light-headedness and headaches.  Psychiatric/Behavioral:  Negative for agitation.   All other systems reviewed and are negative.   Per HPI unless specifically indicated above   Allergies as of 08/01/2023       Reactions   Statins Other (See Comments)   Myalgia        Medication List        Accurate as of August 01, 2023 12:08 PM. If you have any  questions, ask your nurse or doctor.          amoxicillin-clavulanate 875-125 MG tablet Commonly known as: AUGMENTIN Take 1 tablet by mouth 2 (two) times daily.   levothyroxine 25 MCG tablet Commonly known as: SYNTHROID Take 1 tablet (25 mcg total) by mouth daily.   metoprolol tartrate 50 MG tablet Commonly known as: LOPRESSOR TAKE 1 TABLET TWICE A DAY (KEEP UPCOMING APPOINTMENT FOR FUTURE REFILLS)   SYSTANE OP Apply 1 drop to eye daily as needed (dry eyes).   triamterene-hydrochlorothiazide 37.5-25 MG capsule Commonly known as: DYAZIDE TAKE 1 CAPSULE DAILY (PLEASE KEEP UPCOMING APPOINTMENT FOR FUTURE REFILLS, THANK YOU)   warfarin 2.5 MG tablet Commonly known as: COUMADIN Take as directed by the anticoagulation clinic. If you are unsure how to take this medication, talk to your nurse or doctor. Original instructions: TAKE 1 TABLET DAILY EXCEPT TAKE 2 TABLETS ON FRIDAYS         Objective:   BP 136/73   Pulse 78   Ht 5\' 8"  (1.727 m)   Wt 67.6 kg   SpO2 100%   BMI 22.66 kg/m   Wt Readings from Last 3 Encounters:  08/01/23 67.6 kg  05/30/23 61.7 kg  04/04/23 61.7 kg    Physical Exam Vitals reviewed.  Constitutional:      General: She is not in acute distress.    Appearance: Normal appearance. She is not diaphoretic.  Cardiovascular:  Rate and Rhythm: Normal rate and regular rhythm.  Pulmonary:     Effort: Pulmonary effort is normal. No respiratory distress.  Musculoskeletal:     Right lower leg: No tenderness. 1+ Pitting Edema (From mid calf to foot) present.     Left lower leg: No tenderness. 2+ Pitting Edema (from subpettallar to foot) present.     Right ankle: No swelling.  Skin:    General: Skin is warm.     Findings: Erythema present.     Comments: Right Lower Leg: Positive for 0.5cm round lesion. Negative for discharge. Negative for weeping.  Left Lower Leg: Positive for 1.5cm linear lesion with sanguinous discharge. Negative for purulent  discharge. Negative for weeping.  Neurological:     Mental Status: She is alert.  Psychiatric:        Behavior: Behavior normal.       Assessment & Plan:   Problem List Items Addressed This Visit   None Visit Diagnoses       Bilateral leg edema    -  Primary     Ulcer of extremity due to chronic venous insufficiency (HCC)         Cat scratch           Unna boots removed in office. Bilateral leg edema appears to be better. Left and right leg lesions are healing well with no signs or concerns at this time for infection. We will re-wrap both lower legs with Unna boots again today and re-assess bilateral edema and lesions in 5-7 days. Instructed her to continue to take the Augmentin as ordered from previous visit Her Blood Pressure at today's visit is 136/73, which is better than her previous visit. Instructed her to continue to check her blood pressures daily at home and continue to hold the Dyazide until her follow visit. Will re-check blood pressure at next visit.  Follow up plan: Return if symptoms worsen or fail to improve.  Counseling provided for all of the vaccine components No orders of the defined types were placed in this encounter.   Maximino Sarin PA-S [facility] 08/01/2023, 12:08 PM

## 2023-08-01 NOTE — Patient Instructions (Signed)
Hold triamterene hydrochlorothiazide until follow-up with Dr. Nadine Counts  May keep taking the metoprolol or restart taking the metoprolol

## 2023-08-02 LAB — FECAL OCCULT BLOOD, IMMUNOCHEMICAL: Fecal Occult Bld: NEGATIVE

## 2023-08-08 ENCOUNTER — Ambulatory Visit (INDEPENDENT_AMBULATORY_CARE_PROVIDER_SITE_OTHER): Payer: MEDICARE | Admitting: Family Medicine

## 2023-08-08 ENCOUNTER — Encounter: Payer: Self-pay | Admitting: Family Medicine

## 2023-08-08 VITALS — BP 129/75 | HR 65 | Temp 98.5°F | Ht 68.0 in

## 2023-08-08 DIAGNOSIS — R600 Localized edema: Secondary | ICD-10-CM

## 2023-08-08 DIAGNOSIS — S91301A Unspecified open wound, right foot, initial encounter: Secondary | ICD-10-CM | POA: Diagnosis not present

## 2023-08-08 DIAGNOSIS — K521 Toxic gastroenteritis and colitis: Secondary | ICD-10-CM | POA: Diagnosis not present

## 2023-08-08 MED ORDER — SILVER SULFADIAZINE 1 % EX CREA: 1.0000 | TOPICAL_CREAM | Freq: Every day | CUTANEOUS | 0 refills | Status: DC

## 2023-08-08 NOTE — Patient Instructions (Signed)
 Culturelle or align probiotic for the diarrhea that was caused by Augmentin .   You can alternatively eat Activia yogurt Clean and replace the bandage on your right foot daily.  Ok to let air out at night time but keep covered during day

## 2023-08-08 NOTE — Progress Notes (Signed)
 Subjective: CC: Follow-up edema PCP: Eliodoro Guerin, DO [name] is a 89 y.o. female presenting to clinic today for:  1.  Leg edema Patient recently treated for cellulitis with Augmentin .  She saw Dr. Steen Eden last week for follow-up and the cellulitis had gotten quite a bit better off of antibiotics and improvement in swelling but still had some lower extremity edema so her legs were rewrapped.  She presents today for follow-up and notes that she had to remove the wraps because they were too tight and causing pain.  She also developed blisters on her right forefoot and the seem to be irritated by her slipper and caused them to burst.  She reports some diarrhea that has been ongoing since the antibiotic   ROS: Per HPI  Allergies  Allergen Reactions   Statins Other (See Comments)    Myalgia   Past Medical History:  Diagnosis Date   A-fib (HCC)    Dyslipidemia    Hyperlipidemia    Hypothyroidism    2 degree amidarone    Pacemaker    PAF (paroxysmal atrial fibrillation) (HCC)    S/P mitral valve repair    SSS (sick sinus syndrome) (HCC)    Systemic hypertension     Current Outpatient Medications:    amoxicillin -clavulanate (AUGMENTIN ) 875-125 MG tablet, Take 1 tablet by mouth 2 (two) times daily., Disp: 20 tablet, Rfl: 0   levothyroxine  (SYNTHROID ) 25 MCG tablet, Take 1 tablet (25 mcg total) by mouth daily., Disp: 90 tablet, Rfl: 3   metoprolol  tartrate (LOPRESSOR ) 50 MG tablet, TAKE 1 TABLET TWICE A DAY (KEEP UPCOMING APPOINTMENT FOR FUTURE REFILLS), Disp: 60 tablet, Rfl: 11   Polyethyl Glycol-Propyl Glycol (SYSTANE OP), Apply 1 drop to eye daily as needed (dry eyes)., Disp: , Rfl:    triamterene -hydrochlorothiazide (DYAZIDE) 37.5-25 MG capsule, TAKE 1 CAPSULE DAILY (PLEASE KEEP UPCOMING APPOINTMENT FOR FUTURE REFILLS, THANK YOU), Disp: 30 capsule, Rfl: 11   warfarin (COUMADIN ) 2.5 MG tablet, TAKE 1 TABLET DAILY EXCEPT TAKE 2 TABLETS ON FRIDAYS, Disp: 90 tablet,  Rfl: 0 Social History   Socioeconomic History   Marital status: Widowed    Spouse name: Not on file   Number of children: 3   Years of education: Not on file   Highest education level: High school graduate  Occupational History   Occupation: Retired  Tobacco Use   Smoking status: Never   Smokeless tobacco: Never  Vaping Use   Vaping status: Never Used  Substance and Sexual Activity   Alcohol  use: No   Drug use: Never   Sexual activity: Not Currently  Other Topics Concern   Not on file  Social History Narrative   Husband passed 06/29/2021   2 sons live with her   Social Drivers of Corporate investment banker Strain: Low Risk  (09/16/2022)   Overall Financial Resource Strain (CARDIA)    Difficulty of Paying Living Expenses: Not hard at all  Food Insecurity: No Food Insecurity (09/16/2022)   Hunger Vital Sign    Worried About Running Out of Food in the Last Year: Never true    Ran Out of Food in the Last Year: Never true  Transportation Needs: No Transportation Needs (09/16/2022)   PRAPARE - Administrator, Civil Service (Medical): No    Lack of Transportation (Non-Medical): No  Physical Activity: Insufficiently Active (09/16/2022)   Exercise Vital Sign    Days of Exercise per Week: 3 days    Minutes of  Exercise per Session: 30 min  Stress: No Stress Concern Present (09/16/2022)   [name]    Feeling of Stress : Not at all  Social Connections: Moderately Isolated (09/16/2022)   Social Connection and Isolation Panel [NHANES]    Frequency of Communication with Friends and Family: More than three times a week    Frequency of Social Gatherings with Friends and Family: More than three times a week    Attends Religious Services: More than 4 times per year    Active Member of [employer] or Organizations: No    Attends Banker Meetings: Never    Marital Status: Widowed  Intimate Partner Violence:  Not At Risk (09/16/2022)   Humiliation, Afraid, Rape, and Kick questionnaire    Fear of Current or Ex-Partner: No    Emotionally Abused: No    Physically Abused: No    Sexually Abused: No   Family History  Problem Relation Age of Onset   Heart attack Mother    Heart attack Father    Parkinson's disease Son     Objective: Office vital signs reviewed. BP 129/75   Pulse 65   Temp 98.5 F (36.9 C)   Ht 5\' 8"  (1.727 m)   SpO2 100%   BMI 22.66 kg/m   Physical Examination:  General: Awake, alert, nontoxic female, No acute distress Extremities: 2+ pitting edema to mid shins bilaterally present.  She has what appears to be a shallow stasis ulcer along the right ventral aspect of the forefoot.  Several other shallow areas of skin breakdown present as well.  No active bullae Skin: Petechiae noted along the left anterior lower leg.  She has venous stasis skin changes to bilateral lower extremities.  No open wounds along the legs present   Assessment/ Plan: 89 y.o. female   Bilateral leg edema  Wound of right foot - Plan: silver  sulfADIAZINE  (SSD) 1 % cream  Drug-induced diarrhea  We wrapped in Unna boots today.  I am ordering her home health to continue with leg wraps and wound care.  She has open wounds on the right foot from where the blisters burst.  1 appears to be a slight ulceration as I can see some the subcutaneous tissue.  I am going to place her on SSD cream  and we discussed wound care at home.  Advised to utilize probiotic either in pill form or through use of yogurt to stabilize gut bacteria   Eliodoro Guerin, DO [facility] [phone]

## 2023-08-12 ENCOUNTER — Ambulatory Visit (INDEPENDENT_AMBULATORY_CARE_PROVIDER_SITE_OTHER): Payer: MEDICARE | Admitting: Family Medicine

## 2023-08-12 ENCOUNTER — Encounter: Payer: Self-pay | Admitting: Family Medicine

## 2023-08-12 VITALS — BP 127/71 | HR 75 | Temp 98.0°F | Wt 149.0 lb

## 2023-08-12 DIAGNOSIS — L03115 Cellulitis of right lower limb: Secondary | ICD-10-CM | POA: Diagnosis not present

## 2023-08-12 DIAGNOSIS — R600 Localized edema: Secondary | ICD-10-CM | POA: Diagnosis not present

## 2023-08-12 DIAGNOSIS — S91301D Unspecified open wound, right foot, subsequent encounter: Secondary | ICD-10-CM | POA: Diagnosis not present

## 2023-08-12 DIAGNOSIS — S91301A Unspecified open wound, right foot, initial encounter: Secondary | ICD-10-CM

## 2023-08-12 MED ORDER — DOXYCYCLINE HYCLATE 100 MG PO TABS: 100.0000 mg | ORAL_TABLET | Freq: Two times a day (BID) | ORAL | 0 refills | Status: AC

## 2023-08-12 NOTE — Progress Notes (Signed)
Subjective: CC: Recheck leg edema PCP: [name], DO [name] is a 89 y.o. female presenting to clinic today for:  1.  Bilateral leg edema Patient was seen on the 10th and had bilateral legs wrapped.  At that time she did sustain some type of injury to the right forefoot which caused blisters.  We prescribed her SSD cream but currently she never did pick this medication up.  Home health nursing has not yet started but plans to be there on Monday.  She reports that the foot did seem to look a little better but then suddenly got worse.  The toes are red and swollen now.  She does not report any exquisite pain.   ROS: Per HPI  Allergies  Allergen Reactions   Statins Other (See Comments)    Myalgia   Past Medical History:  Diagnosis Date   A-fib (HCC)    Dyslipidemia    Hyperlipidemia    Hypothyroidism    2 degree amidarone    Pacemaker    PAF (paroxysmal atrial fibrillation) (HCC)    S/P mitral valve repair    SSS (sick sinus syndrome) (HCC)    Systemic hypertension     Current Outpatient Medications:    amoxicillin-clavulanate (AUGMENTIN) 875-125 MG tablet, Take 1 tablet by mouth 2 (two) times daily., Disp: 20 tablet, Rfl: 0   levothyroxine (SYNTHROID) 25 MCG tablet, Take 1 tablet (25 mcg total) by mouth daily., Disp: 90 tablet, Rfl: 3   metoprolol tartrate (LOPRESSOR) 50 MG tablet, TAKE 1 TABLET TWICE A DAY (KEEP UPCOMING APPOINTMENT FOR FUTURE REFILLS), Disp: 60 tablet, Rfl: 11   Polyethyl Glycol-Propyl Glycol (SYSTANE OP), Apply 1 drop to eye daily as needed (dry eyes)., Disp: , Rfl:    silver sulfADIAZINE (SSD) 1 % cream, Apply 1 Application topically daily. To affected areas on foot until healed, Disp: 50 g, Rfl: 0   triamterene-hydrochlorothiazide (DYAZIDE) 37.5-25 MG capsule, TAKE 1 CAPSULE DAILY (PLEASE KEEP UPCOMING APPOINTMENT FOR FUTURE REFILLS, THANK YOU), Disp: 30 capsule, Rfl: 11   warfarin (COUMADIN) 2.5 MG tablet, TAKE 1 TABLET DAILY EXCEPT  TAKE 2 TABLETS ON FRIDAYS, Disp: 90 tablet, Rfl: 0 Social History   Socioeconomic History   Marital status: Widowed    Spouse name: Not on file   Number of children: 3   Years of education: Not on file   Highest education level: High school graduate  Occupational History   Occupation: Retired  Tobacco Use   Smoking status: Never   Smokeless tobacco: Never  Vaping Use   Vaping status: Never Used  Substance and Sexual Activity   Alcohol use: No   Drug use: Never   Sexual activity: Not Currently  Other Topics Concern   Not on file  Social History Narrative   Husband passed 06/29/2021   2 sons live with her   Social Drivers of Corporate investment banker Strain: Low Risk  (09/16/2022)   Overall Financial Resource Strain (CARDIA)    Difficulty of Paying Living Expenses: Not hard at all  Food Insecurity: No Food Insecurity (09/16/2022)   Hunger Vital Sign    Worried About Running Out of Food in the Last Year: Never true    Ran Out of Food in the Last Year: Never true  Transportation Needs: No Transportation Needs (09/16/2022)   PRAPARE - Administrator, Civil Service (Medical): No    Lack of Transportation (Non-Medical): No  Physical Activity: Insufficiently Active (09/16/2022)  Exercise Vital Sign    Days of Exercise per Week: 3 days    Minutes of Exercise per Session: 30 min  Stress: No Stress Concern Present (09/16/2022)   [name]    Feeling of Stress : Not at all  Social Connections: Moderately Isolated (09/16/2022)   Social Connection and Isolation Panel [NHANES]    Frequency of Communication with Friends and Family: More than three times a week    Frequency of Social Gatherings with Friends and Family: More than three times a week    Attends Religious Services: More than 4 times per year    Active Member of [employer] or Organizations: No    Attends Banker Meetings: Never    Marital  Status: Widowed  Intimate Partner Violence: Not At Risk (09/16/2022)   Humiliation, Afraid, Rape, and Kick questionnaire    Fear of Current or Ex-Partner: No    Emotionally Abused: No    Physically Abused: No    Sexually Abused: No   Family History  Problem Relation Age of Onset   Heart attack Mother    Heart attack Father    Parkinson's disease Son     Objective: Office vital signs reviewed. BP 127/71   Pulse 75   Temp 98 F (36.7 C)   Wt 149 lb (67.6 kg)   SpO2 100%   BMI 22.66 kg/m   Physical Examination:  General: Awake, alert, nontoxic elderly female, No acute distress MSK: Right foot with erythema, soft tissue swelling.  Minimal warmth.  No exudates.  Legs are no longer weeping but still edematous.  1+ pitting edema to the right lower extremity, +2 pitting edema to the left lower extremity.  Venous stasis skin changes appreciated bilaterally   Assessment/ Plan: 89 y.o. female   Bilateral leg edema  Wound of right foot - Plan: doxycycline (VIBRA-TABS) 100 MG tablet  Cellulitis of right foot  Leg edema improving.  No weeping observed.  Left leg was rewrapped.  Right foot was dressed with SSD cream and rewrapped.  At this point it appears that she has developed a secondary bacterial infection and I am placing her on oral antibiotics again did cover cellulitis.  She has home health wound care and nursing coming in on Monday so we will plan to follow-up remotely with them   [name], DO [facility] [phone]

## 2023-08-15 DIAGNOSIS — Z556 Problems related to health literacy: Secondary | ICD-10-CM | POA: Diagnosis not present

## 2023-08-15 DIAGNOSIS — S91301D Unspecified open wound, right foot, subsequent encounter: Secondary | ICD-10-CM | POA: Diagnosis not present

## 2023-08-15 DIAGNOSIS — Z7901 Long term (current) use of anticoagulants: Secondary | ICD-10-CM | POA: Diagnosis not present

## 2023-08-15 DIAGNOSIS — L03115 Cellulitis of right lower limb: Secondary | ICD-10-CM | POA: Diagnosis not present

## 2023-08-15 DIAGNOSIS — I495 Sick sinus syndrome: Secondary | ICD-10-CM | POA: Diagnosis not present

## 2023-08-15 DIAGNOSIS — I10 Essential (primary) hypertension: Secondary | ICD-10-CM | POA: Diagnosis not present

## 2023-08-15 DIAGNOSIS — E039 Hypothyroidism, unspecified: Secondary | ICD-10-CM | POA: Diagnosis not present

## 2023-08-15 DIAGNOSIS — K521 Toxic gastroenteritis and colitis: Secondary | ICD-10-CM | POA: Diagnosis not present

## 2023-08-15 DIAGNOSIS — I480 Paroxysmal atrial fibrillation: Secondary | ICD-10-CM | POA: Diagnosis not present

## 2023-08-15 DIAGNOSIS — Z604 Social exclusion and rejection: Secondary | ICD-10-CM | POA: Diagnosis not present

## 2023-08-22 DIAGNOSIS — I480 Paroxysmal atrial fibrillation: Secondary | ICD-10-CM | POA: Diagnosis not present

## 2023-08-22 DIAGNOSIS — L03115 Cellulitis of right lower limb: Secondary | ICD-10-CM | POA: Diagnosis not present

## 2023-08-22 DIAGNOSIS — S91301D Unspecified open wound, right foot, subsequent encounter: Secondary | ICD-10-CM | POA: Diagnosis not present

## 2023-08-22 DIAGNOSIS — E039 Hypothyroidism, unspecified: Secondary | ICD-10-CM | POA: Diagnosis not present

## 2023-08-22 DIAGNOSIS — I10 Essential (primary) hypertension: Secondary | ICD-10-CM | POA: Diagnosis not present

## 2023-08-22 DIAGNOSIS — I495 Sick sinus syndrome: Secondary | ICD-10-CM | POA: Diagnosis not present

## 2023-08-23 ENCOUNTER — Ambulatory Visit: Payer: MEDICARE

## 2023-08-23 DIAGNOSIS — K521 Toxic gastroenteritis and colitis: Secondary | ICD-10-CM | POA: Diagnosis not present

## 2023-08-23 DIAGNOSIS — I480 Paroxysmal atrial fibrillation: Secondary | ICD-10-CM

## 2023-08-23 DIAGNOSIS — L03115 Cellulitis of right lower limb: Secondary | ICD-10-CM | POA: Diagnosis not present

## 2023-08-23 DIAGNOSIS — E039 Hypothyroidism, unspecified: Secondary | ICD-10-CM

## 2023-08-23 DIAGNOSIS — S91301D Unspecified open wound, right foot, subsequent encounter: Secondary | ICD-10-CM | POA: Diagnosis not present

## 2023-08-23 DIAGNOSIS — Z7901 Long term (current) use of anticoagulants: Secondary | ICD-10-CM

## 2023-08-23 DIAGNOSIS — Z556 Problems related to health literacy: Secondary | ICD-10-CM

## 2023-08-23 DIAGNOSIS — I10 Essential (primary) hypertension: Secondary | ICD-10-CM | POA: Diagnosis not present

## 2023-08-23 DIAGNOSIS — Z604 Social exclusion and rejection: Secondary | ICD-10-CM

## 2023-08-23 DIAGNOSIS — I495 Sick sinus syndrome: Secondary | ICD-10-CM | POA: Diagnosis not present

## 2023-08-25 ENCOUNTER — Telehealth: Payer: Self-pay | Admitting: *Deleted

## 2023-08-25 NOTE — Telephone Encounter (Signed)
 TC from [facility] Saw pt on Tuesday, blisters have scabbed over Pt not wanting anything else done Nurse has talked pt into 1x wk for 3 wks, just to monitor

## 2023-08-30 ENCOUNTER — Ambulatory Visit (INDEPENDENT_AMBULATORY_CARE_PROVIDER_SITE_OTHER): Payer: MEDICARE

## 2023-08-30 DIAGNOSIS — I495 Sick sinus syndrome: Secondary | ICD-10-CM | POA: Diagnosis not present

## 2023-08-30 DIAGNOSIS — I480 Paroxysmal atrial fibrillation: Secondary | ICD-10-CM

## 2023-08-31 DIAGNOSIS — S91301D Unspecified open wound, right foot, subsequent encounter: Secondary | ICD-10-CM | POA: Diagnosis not present

## 2023-08-31 DIAGNOSIS — I495 Sick sinus syndrome: Secondary | ICD-10-CM | POA: Diagnosis not present

## 2023-08-31 DIAGNOSIS — I480 Paroxysmal atrial fibrillation: Secondary | ICD-10-CM | POA: Diagnosis not present

## 2023-08-31 DIAGNOSIS — E039 Hypothyroidism, unspecified: Secondary | ICD-10-CM | POA: Diagnosis not present

## 2023-08-31 DIAGNOSIS — I10 Essential (primary) hypertension: Secondary | ICD-10-CM | POA: Diagnosis not present

## 2023-08-31 DIAGNOSIS — L03115 Cellulitis of right lower limb: Secondary | ICD-10-CM | POA: Diagnosis not present

## 2023-09-01 LAB — CUP PACEART REMOTE DEVICE CHECK
Battery Remaining Longevity: 1 mo — CL
Battery Voltage: 2.63 V
Brady Statistic AP VP Percent: 6.82 %
Brady Statistic AP VS Percent: 67.11 %
Brady Statistic AS VP Percent: 1.54 %
Brady Statistic AS VS Percent: 24.54 %
Brady Statistic RA Percent Paced: 76.25 %
Brady Statistic RV Percent Paced: 8.36 %
Date Time Interrogation Session: 20250304003801
Implantable Lead Connection Status: 753985
Implantable Lead Connection Status: 753985
Implantable Lead Implant Date: 20081126
Implantable Lead Implant Date: 20081126
Implantable Lead Location: 753859
Implantable Lead Location: 753860
Implantable Lead Model: 5076
Implantable Lead Model: 5076
Implantable Pulse Generator Implant Date: 20180228
Lead Channel Impedance Value: 323 Ohm
Lead Channel Impedance Value: 342 Ohm
Lead Channel Impedance Value: 361 Ohm
Lead Channel Impedance Value: 380 Ohm
Lead Channel Pacing Threshold Amplitude: 1 V
Lead Channel Pacing Threshold Pulse Width: 0.4 ms
Lead Channel Sensing Intrinsic Amplitude: 0.125 mV
Lead Channel Sensing Intrinsic Amplitude: 0.125 mV
Lead Channel Sensing Intrinsic Amplitude: 7.25 mV
Lead Channel Sensing Intrinsic Amplitude: 7.25 mV
Lead Channel Setting Pacing Amplitude: 2.75 V
Lead Channel Setting Pacing Amplitude: 3.5 V
Lead Channel Setting Pacing Pulse Width: 0.4 ms
Lead Channel Setting Sensing Sensitivity: 2 mV
Zone Setting Status: 755011
Zone Setting Status: 755011

## 2023-09-06 ENCOUNTER — Ambulatory Visit: Payer: MEDICARE | Admitting: Family Medicine

## 2023-09-06 NOTE — Progress Notes (Signed)
 Remote pacemaker transmission.

## 2023-09-06 NOTE — Progress Notes (Deleted)
 Subjective: CC:*** PCP: Melissa Ip, DO ZOX:[id number] [name] is a 89 y.o. female presenting to clinic today for:  1. ***   ROS: Per HPI  Allergies  Allergen Reactions   Statins Other (See Comments)    Myalgia   Past Medical History:  Diagnosis Date   A-fib (HCC)    Dyslipidemia    Hyperlipidemia    Hypothyroidism    2 degree [place]arone    Pacemaker    PAF (paroxysmal atrial fibrillation) (HCC)    S/P mitral valve repair    SSS (sick sinus syndrome) (HCC)    Systemic hypertension     Current Outpatient Medications:    levothyroxine (SYNTHROID) 25 MCG tablet, Take 1 tablet (25 mcg total) by mouth daily., Disp: 90 tablet, Rfl: 3   metoprolol tartrate (LOPRESSOR) 50 MG tablet, TAKE 1 TABLET TWICE A DAY (KEEP UPCOMING APPOINTMENT FOR FUTURE REFILLS), Disp: 60 tablet, Rfl: 11   Polyethyl Glycol-Propyl Glycol (SYSTANE OP), Apply 1 drop to eye daily as needed (dry eyes)., Disp: , Rfl:    silver sulfADIAZINE (SSD) 1 % cream, Apply 1 Application topically daily. To affected areas on foot until healed, Disp: 50 g, Rfl: 0   triamterene-hydrochlorothiazide (DYAZIDE) 37.5-25 MG capsule, TAKE 1 CAPSULE DAILY (PLEASE KEEP UPCOMING APPOINTMENT FOR FUTURE REFILLS, THANK YOU), Disp: 30 capsule, Rfl: 11   warfarin (COUMADIN) 2.5 MG tablet, TAKE 1 TABLET DAILY EXCEPT TAKE 2 TABLETS ON FRIDAYS, Disp: 90 tablet, Rfl: 0 Social History   Socioeconomic History   Marital status: Widowed    Spouse name: Not on file   Number of children: 3   Years of education: Not on file   Highest education level: High school graduate  Occupational History   Occupation: Retired  Tobacco Use   Smoking status: Never   Smokeless tobacco: Never  Vaping Use   Vaping status: Never Used  Substance and Sexual Activity   Alcohol use: No   Drug use: Never   Sexual activity: Not Currently  Other Topics Concern   Not on file  Social History Narrative   Husband passed 06/29/2021   2 sons live with her    Social Drivers of Corporate investment banker Strain: Low Risk  (09/16/2022)   Overall Financial Resource Strain (CARDIA)    Difficulty of Paying Living Expenses: Not hard at all  Food Insecurity: No Food Insecurity (09/16/2022)   Hunger Vital Sign    Worried About Running Out of Food in the Last Year: Never true    Ran Out of Food in the Last Year: Never true  Transportation Needs: No Transportation Needs (09/16/2022)   PRAPARE - Administrator, Civil Service (Medical): No    Lack of Transportation (Non-Medical): No  Physical Activity: Insufficiently Active (09/16/2022)   Exercise Vital Sign    Days of Exercise per Week: 3 days    Minutes of Exercise per Session: 30 min  Stress: No Stress Concern Present (09/16/2022)   [name]    Feeling of Stress : Not at all  Social Connections: Moderately Isolated (09/16/2022)   Social Connection and Isolation Panel [NHANES]    Frequency of Communication with Friends and Family: More than three times a week    Frequency of Social Gatherings with Friends and Family: More than three times a week    Attends Religious Services: More than 4 times per year    Active Member of Clubs or Organizations: No  Attends Banker Meetings: Never    Marital Status: Widowed  Intimate Partner Violence: Not At Risk (09/16/2022)   Humiliation, Afraid, Rape, and Kick questionnaire    Fear of Current or Ex-Partner: No    Emotionally Abused: No    Physically Abused: No    Sexually Abused: No   Family History  Problem Relation Age of Onset   Heart attack Mother    Heart attack Father    Parkinson's disease Son     Objective: Office vital signs reviewed. There were no vitals taken for this visit.  Physical Examination:  General: Awake, alert, *** nourished, No acute distress HEENT: Normal    Neck: No masses palpated. No lymphadenopathy    Ears: Tympanic membranes  intact, normal light reflex, no erythema, no bulging    Eyes: PERRLA, extraocular membranes intact, sclera ***    Nose: nasal turbinates moist, *** nasal discharge    Throat: moist mucus membranes, no erythema, *** tonsillar exudate.  Airway is patent Cardio: regular rate and rhythm, S1S2 heard, no murmurs appreciated Pulm: clear to auscultation bilaterally, no wheezes, rhonchi or rales; normal work of breathing on room air GI: soft, non-tender, non-distended, bowel sounds present x4, no hepatomegaly, no splenomegaly, no masses GU: external vaginal tissue ***, cervix ***, *** punctate lesions on cervix appreciated, *** discharge from cervical os, *** bleeding, *** cervical motion tenderness, *** abdominal/ adnexal masses Extremities: warm, well perfused, No edema, cyanosis or clubbing; +*** pulses bilaterally MSK: *** gait and *** station Skin: dry; intact; no rashes or lesions Neuro: *** Strength and light touch sensation grossly intact, *** DTRs ***/4  Assessment/ Plan: 89 y.o. female   Paroxysmal atrial fibrillation (HCC)  Anticoagulation monitoring, INR range 2-3  Iron deficiency anemia, unspecified iron deficiency anemia type  ***   Melissa Ammon Hulen Skains, DO [facility] [phone]

## 2023-09-07 ENCOUNTER — Encounter: Payer: Self-pay | Admitting: Family Medicine

## 2023-09-08 ENCOUNTER — Ambulatory Visit: Payer: MEDICARE | Attending: Cardiovascular Disease | Admitting: Cardiovascular Disease

## 2023-09-08 ENCOUNTER — Other Ambulatory Visit: Payer: Self-pay

## 2023-09-08 ENCOUNTER — Ambulatory Visit: Payer: MEDICARE | Admitting: Family Medicine

## 2023-09-08 ENCOUNTER — Ambulatory Visit: Payer: MEDICARE

## 2023-09-08 ENCOUNTER — Encounter: Payer: Self-pay | Admitting: Family Medicine

## 2023-09-08 DIAGNOSIS — Z7901 Long term (current) use of anticoagulants: Secondary | ICD-10-CM

## 2023-09-08 DIAGNOSIS — I480 Paroxysmal atrial fibrillation: Secondary | ICD-10-CM

## 2023-09-08 LAB — COAGUCHEK XS/INR WAIVED
INR: 1.1 (ref 0.9–1.1)
Prothrombin Time: 12.8 s

## 2023-09-08 NOTE — Progress Notes (Signed)
 BP 126/70   Pulse 66   Ht 5\' 8"  (1.727 m)   Wt 141 lb (64 kg)   SpO2 96%   BMI 21.44 kg/m    Subjective:   Patient ID: [name], female    DOB: February 19, 1929, 89 y.o.   MRN: [id number]  HPI: [name] is a 89 y.o. female presenting on 09/08/2023 for Medical Management of Chronic Issues and Atrial Fibrillation   HPI Coumadin recheck Target goal: 2.0-3.0 Reason on anticoagulation: A-fib Patient denies any bruising or bleeding or chest pain or palpitations   Relevant past medical, surgical, family and social history reviewed and updated as indicated. Interim medical history since our last visit reviewed. Allergies and medications reviewed and updated.  Review of Systems  Constitutional:  Negative for chills and fever.  Eyes:  Negative for redness and visual disturbance.  Respiratory:  Negative for chest tightness and shortness of breath.   Cardiovascular:  Negative for chest pain and leg swelling.  Gastrointestinal:  Negative for blood in stool.  Genitourinary:  Negative for hematuria.  Musculoskeletal:  Negative for back pain and gait problem.  Skin:  Negative for rash.  Neurological:  Negative for light-headedness and headaches.  Psychiatric/Behavioral:  Negative for agitation and behavioral problems.   All other systems reviewed and are negative.   Per HPI unless specifically indicated above   Allergies as of 09/08/2023       Reactions   Statins Other (See Comments)   Myalgia        Medication List        Accurate as of September 08, 2023 12:09 PM. If you have any questions, ask your nurse or doctor.          levothyroxine 25 MCG tablet Commonly known as: SYNTHROID Take 1 tablet (25 mcg total) by mouth daily.   metoprolol tartrate 50 MG tablet Commonly known as: LOPRESSOR TAKE 1 TABLET TWICE A DAY (KEEP UPCOMING APPOINTMENT FOR FUTURE REFILLS)   silver sulfADIAZINE 1 % cream Commonly known as: SSD Apply 1 Application topically daily. To  affected areas on foot until healed   SYSTANE OP Apply 1 drop to eye daily as needed (dry eyes).   triamterene-hydrochlorothiazide 37.5-25 MG capsule Commonly known as: DYAZIDE TAKE 1 CAPSULE DAILY (PLEASE KEEP UPCOMING APPOINTMENT FOR FUTURE REFILLS, THANK YOU)   warfarin 2.5 MG tablet Commonly known as: COUMADIN Take as directed by the anticoagulation clinic. If you are unsure how to take this medication, talk to your nurse or doctor. Original instructions: TAKE 1 TABLET DAILY EXCEPT TAKE 2 TABLETS ON FRIDAYS         Objective:   BP 126/70   Pulse 66   Ht 5\' 8"  (1.727 m)   Wt 141 lb (64 kg)   SpO2 96%   BMI 21.44 kg/m   Wt Readings from Last 3 Encounters:  09/08/23 141 lb (64 kg)  08/12/23 149 lb (67.6 kg)  08/01/23 149 lb (67.6 kg)    Physical Exam Vitals and nursing note reviewed.  Constitutional:      General: She is not in acute distress.    Appearance: Normal appearance.  Neurological:     Mental Status: She is alert.       Assessment & Plan:   Problem List Items Addressed This Visit       Cardiovascular and Mediastinum   Paroxysmal atrial fibrillation (HCC)     Other   Anticoagulation monitoring, INR range 2-3    Description  Goal INR 2-3  INR 09/08/23 1.1 Instructed to take 2 dose today (5 mg total). Resume her previous schedule now that she has been taking of 1 tablet daily per patient, she said she missed 3 or 4 days because she was sick to her stomach so has not taken it in at least that many days. Repeat INR in 10-14 days    Patient has some confusions over medication so instructed her to bring all her medicines and her pill case with her next time she comes.  Also printed a list of her medications with the pictures of the ones that she says she gets.  Follow up plan: Return if symptoms worsen or fail to improve, for 10 to 14-day INR with PCP.  Counseling provided for all of the vaccine components No orders of the defined types were  placed in this encounter.   Arville Care, MD [facility] 09/08/2023, 12:09 PM

## 2023-09-08 NOTE — Patient Instructions (Addendum)
  Levothyroxine , take 1 in the morning  Metoprolol 50mg , take 1, morning and night  Triamterene-hydrochlorothiazide 37.5-25mg  take 1 in morning  Warfin 2.5 mg, follow calendar

## 2023-09-12 DIAGNOSIS — I480 Paroxysmal atrial fibrillation: Secondary | ICD-10-CM | POA: Diagnosis not present

## 2023-09-12 DIAGNOSIS — S91301D Unspecified open wound, right foot, subsequent encounter: Secondary | ICD-10-CM | POA: Diagnosis not present

## 2023-09-12 DIAGNOSIS — I10 Essential (primary) hypertension: Secondary | ICD-10-CM | POA: Diagnosis not present

## 2023-09-12 DIAGNOSIS — I495 Sick sinus syndrome: Secondary | ICD-10-CM | POA: Diagnosis not present

## 2023-09-12 DIAGNOSIS — E039 Hypothyroidism, unspecified: Secondary | ICD-10-CM | POA: Diagnosis not present

## 2023-09-12 DIAGNOSIS — L03115 Cellulitis of right lower limb: Secondary | ICD-10-CM | POA: Diagnosis not present

## 2023-09-14 DIAGNOSIS — K521 Toxic gastroenteritis and colitis: Secondary | ICD-10-CM | POA: Diagnosis not present

## 2023-09-14 DIAGNOSIS — S91301D Unspecified open wound, right foot, subsequent encounter: Secondary | ICD-10-CM | POA: Diagnosis not present

## 2023-09-14 DIAGNOSIS — I495 Sick sinus syndrome: Secondary | ICD-10-CM | POA: Diagnosis not present

## 2023-09-14 DIAGNOSIS — Z7901 Long term (current) use of anticoagulants: Secondary | ICD-10-CM | POA: Diagnosis not present

## 2023-09-14 DIAGNOSIS — I10 Essential (primary) hypertension: Secondary | ICD-10-CM | POA: Diagnosis not present

## 2023-09-14 DIAGNOSIS — Z604 Social exclusion and rejection: Secondary | ICD-10-CM | POA: Diagnosis not present

## 2023-09-14 DIAGNOSIS — I480 Paroxysmal atrial fibrillation: Secondary | ICD-10-CM | POA: Diagnosis not present

## 2023-09-14 DIAGNOSIS — L03115 Cellulitis of right lower limb: Secondary | ICD-10-CM | POA: Diagnosis not present

## 2023-09-14 DIAGNOSIS — Z556 Problems related to health literacy: Secondary | ICD-10-CM | POA: Diagnosis not present

## 2023-09-14 DIAGNOSIS — E039 Hypothyroidism, unspecified: Secondary | ICD-10-CM | POA: Diagnosis not present

## 2023-09-15 ENCOUNTER — Telehealth: Payer: Self-pay

## 2023-09-15 NOTE — Telephone Encounter (Signed)
 Pacemaker reached RRT 09/15/23. Needs in-clinic apt to discuss battery change out.

## 2023-09-17 NOTE — Telephone Encounter (Signed)
 She missed her 3/13 appt. She has other serious health issues. Would like to see in clinic before we schedule the changeout.

## 2023-09-19 ENCOUNTER — Ambulatory Visit (INDEPENDENT_AMBULATORY_CARE_PROVIDER_SITE_OTHER): Payer: MEDICARE

## 2023-09-19 VITALS — Ht 68.0 in | Wt 141.0 lb

## 2023-09-19 DIAGNOSIS — I10 Essential (primary) hypertension: Secondary | ICD-10-CM | POA: Diagnosis not present

## 2023-09-19 DIAGNOSIS — E039 Hypothyroidism, unspecified: Secondary | ICD-10-CM | POA: Diagnosis not present

## 2023-09-19 DIAGNOSIS — I480 Paroxysmal atrial fibrillation: Secondary | ICD-10-CM | POA: Diagnosis not present

## 2023-09-19 DIAGNOSIS — S91301D Unspecified open wound, right foot, subsequent encounter: Secondary | ICD-10-CM | POA: Diagnosis not present

## 2023-09-19 DIAGNOSIS — I495 Sick sinus syndrome: Secondary | ICD-10-CM | POA: Diagnosis not present

## 2023-09-19 DIAGNOSIS — Z0000 Encounter for general adult medical examination without abnormal findings: Secondary | ICD-10-CM

## 2023-09-19 DIAGNOSIS — L03115 Cellulitis of right lower limb: Secondary | ICD-10-CM | POA: Diagnosis not present

## 2023-09-19 NOTE — Patient Instructions (Signed)
 Ms. [name] , Thank you for taking time to come for your Medicare Wellness Visit. I appreciate your ongoing commitment to your health goals. Please review the following plan we discussed and let me know if I can assist you in the future.   Referrals/Orders/Follow-Ups/Clinician Recommendations: Aim for 30 minutes of exercise or brisk walking, 6-8 glasses of water, and 5 servings of fruits and vegetables each day.  This is a list of the screening recommended for you and due dates:  Health Maintenance  Topic Date Due   COVID-19 Vaccine (3 - 2024-25 season) 02/27/2023   Flu Shot  09/26/2023*   Zoster (Shingles) Vaccine (1 of 2) 10/26/2023*   Pneumonia Vaccine (2 of 2 - PPSV23 or PCV20) 03/07/2024*   Medicare Annual Wellness Visit  09/18/2024   DTaP/Tdap/Td vaccine (2 - Td or Tdap) 05/17/2033   DEXA scan (bone density measurement)  Completed   HPV Vaccine  Aged Out  *Topic was postponed. The date shown is not the original due date.    Advanced directives: (ACP Link)Information on Advanced Care Planning can be found at [facility] Advance Health Care Directives Advance Health Care Directives. [email or web address]   Next Medicare Annual Wellness Visit scheduled for next year: Yes

## 2023-09-19 NOTE — Progress Notes (Signed)
 Subjective:   DYANNE YORKS is a 89 y.o. who presents for a Medicare Wellness preventive visit.  Visit Complete: Virtual I connected with  SHANAYA SCHNECK on 09/19/23 by a audio enabled telemedicine application and verified that I am speaking with the correct person using two identifiers.  Patient Location: Home  Provider Location: Home Office  I discussed the limitations of evaluation and management by telemedicine. The patient expressed understanding and agreed to proceed.  Vital Signs: Because this visit was a virtual/telehealth visit, some criteria may be missing or patient reported. Any vitals not documented were not able to be obtained and vitals that have been documented are patient reported.  VideoDeclined- This patient declined Librarian, academic. Therefore the visit was completed with audio only.  Persons Participating in Visit: Patient.  AWV Questionnaire: No: Patient Medicare AWV questionnaire was not completed prior to this visit.  Cardiac Risk Factors include: advanced age (>71men, >87 women);hypertension;dyslipidemia     Objective:    Today's Vitals   09/19/23 1115  Weight: 141 lb (64 kg)  Height: 5\' 8"  (1.727 m)   Body mass index is 21.44 kg/m.     09/19/2023   11:19 AM 09/16/2022   10:36 AM 09/14/2021    1:20 PM 09/11/2020    2:26 PM 01/17/2019    8:43 AM 04/29/2018    1:32 PM 08/25/2016   12:46 PM  Advanced Directives  Does Patient Have a Medical Advance Directive? No Yes No No No No No  Type of Furniture conservator/restorer;Living will       Copy of Healthcare Power of Attorney in Chart?  No - copy requested       Would patient like information on creating a medical advance directive? Yes (MAU/Ambulatory/Procedural Areas - Information given)  Yes (MAU/Ambulatory/Procedural Areas - Information given) No - Patient declined No - Patient declined  No - Patient declined    Current Medications  (verified) Outpatient Encounter Medications as of 09/19/2023  Medication Sig   levothyroxine (SYNTHROID) 25 MCG tablet Take 1 tablet (25 mcg total) by mouth daily.   metoprolol tartrate (LOPRESSOR) 50 MG tablet TAKE 1 TABLET TWICE A DAY (KEEP UPCOMING APPOINTMENT FOR FUTURE REFILLS)   Polyethyl Glycol-Propyl Glycol (SYSTANE OP) Apply 1 drop to eye daily as needed (dry eyes).   silver sulfADIAZINE (SSD) 1 % cream Apply 1 Application topically daily. To affected areas on foot until healed   triamterene-hydrochlorothiazide (DYAZIDE) 37.5-25 MG capsule TAKE 1 CAPSULE DAILY (PLEASE KEEP UPCOMING APPOINTMENT FOR FUTURE REFILLS, THANK YOU)   warfarin (COUMADIN) 2.5 MG tablet TAKE 1 TABLET DAILY EXCEPT TAKE 2 TABLETS ON FRIDAYS   No facility-administered encounter medications on file as of 09/19/2023.    Allergies (verified) Statins   History: Past Medical History:  Diagnosis Date   A-fib (HCC)    Dyslipidemia    Hyperlipidemia    Hypothyroidism    2 degree amidarone    Pacemaker    PAF (paroxysmal atrial fibrillation) (HCC)    S/P mitral valve repair    SSS (sick sinus syndrome) (HCC)    Systemic hypertension    Past Surgical History:  Procedure Laterality Date   ABDOMINAL HYSTERECTOMY  1989   CARDIAC CATHETERIZATION  01/11/2008   mild-mod pulm. hypertension,normal coronaries   MINIMALLY INVASIVE MAZE PROCEDURE  02/2008   MITRAL VALVE REPAIR  September 2009   [facility] ring annuloplasty   NM MYOVIEW LTD  03/28/2007   no ischemia  PACEMAKER PLACEMENT  November 2008   PPM GENERATOR CHANGEOUT N/A 08/25/2016   Procedure: PPM Generator Changeout - Battery Only;  Surgeon: Thurmon Fair, MD;  Location: [facility];  Service: Cardiovascular;  Laterality: N/A;   US ECHOCARDIOGRAPHY  03/07/2012   mild LVH,EF =>55%,mild MR,mod TR,trace AI,right atrium mod. dilated   Family History  Problem Relation Age of Onset   Heart attack Mother    Heart attack Father    Parkinson's disease  Son    Social History   Socioeconomic History   Marital status: Widowed    Spouse name: Not on file   Number of children: 3   Years of education: Not on file   Highest education level: High school graduate  Occupational History   Occupation: Retired  Tobacco Use   Smoking status: Never   Smokeless tobacco: Never  Vaping Use   Vaping status: Never Used  Substance and Sexual Activity   Alcohol use: No   Drug use: Never   Sexual activity: Not Currently  Other Topics Concern   Not on file  Social History Narrative   Husband passed 06/29/2021   2 sons live with her   Social Drivers of Corporate investment banker Strain: Low Risk  (09/19/2023)   Overall Financial Resource Strain (CARDIA)    Difficulty of Paying Living Expenses: Not hard at all  Food Insecurity: No Food Insecurity (09/19/2023)   Hunger Vital Sign    Worried About Running Out of Food in the Last Year: Never true    Ran Out of Food in the Last Year: Never true  Transportation Needs: No Transportation Needs (09/19/2023)   PRAPARE - Administrator, Civil Service (Medical): No    Lack of Transportation (Non-Medical): No  Physical Activity: Insufficiently Active (09/19/2023)   Exercise Vital Sign    Days of Exercise per Week: 3 days    Minutes of Exercise per Session: 30 min  Stress: No Stress Concern Present (09/19/2023)   [name]    Feeling of Stress : Not at all  Social Connections: Moderately Isolated (09/19/2023)   Social Connection and Isolation Panel [NHANES]    Frequency of Communication with Friends and Family: More than three times a week    Frequency of Social Gatherings with Friends and Family: More than three times a week    Attends Religious Services: More than 4 times per year    Active Member of [employer] or Organizations: No    Attends Banker Meetings: Never    Marital Status: Widowed    Tobacco  Counseling Counseling given: Not Answered    Clinical Intake:  Pre-visit preparation completed: Yes  Pain : No/denies pain     Diabetes: No  No results found for: "HGBA1C"   How often do you need to have someone help you when you read instructions, pamphlets, or other written materials from your doctor or pharmacy?: 1 - Never  Interpreter Needed?: No  Information entered by :: Kandis Fantasia LPN   Activities of Daily Living     09/19/2023   11:19 AM  In your present state of health, do you have any difficulty performing the following activities:  Hearing? 0  Vision? 0  Difficulty concentrating or making decisions? 0  Walking or climbing stairs? 0  Dressing or bathing? 0  Doing errands, shopping? 0  Preparing Food and eating ? N  Using the Toilet? N  In the  past six months, have you accidently leaked urine? N  Do you have problems with loss of bowel control? N  Managing your Medications? N  Managing your Finances? N  Housekeeping or managing your Housekeeping? N    Patient Care Team: [name], DO as PCP - General (Family Medicine) Randa Spike Kelton Pillar, LCSW as [facility] Care Management (Licensed Clinical Social Worker) Croitoru, Rachelle Hora, MD as Consulting Physician (Cardiology) Chrystie Nose, MD as Consulting Physician (Cardiology)  Indicate any recent Medical Services you may have received from other than [facility] providers in the past year (date may be approximate).     Assessment:   This is a routine wellness examination for Tazaria.  Hearing/Vision screen Hearing Screening - Comments:: Denies hearing difficulties   Vision Screening - Comments:: Wears rx glasses - up to date with routine eye exams with Dr. Conley Rolls    Goals Addressed             This Visit's Progress    COMPLETED: AWV       09/14/2021 AWV Goal: Exercise for General Health  Patient will verbalize understanding of the benefits of increased physical  activity: Exercising regularly is important. It will improve your overall fitness, flexibility, and endurance. Regular exercise also will improve your overall health. It can help you control your weight, reduce stress, and improve your bone density. Over the next year, patient will increase physical activity as tolerated with a goal of at least 150 minutes of moderate physical activity per week.  You can tell that you are exercising at a moderate intensity if your heart starts beating faster and you start breathing faster but can still hold a conversation. Moderate-intensity exercise ideas include: Walking 1 mile (1.6 km) in about 15 minutes Biking Hiking Golfing Dancing Water aerobics Patient will verbalize understanding of everyday activities that increase physical activity by providing examples like the following: Yard work, such as: Insurance underwriter Gardening Washing windows or floors Patient will be able to explain general safety guidelines for exercising:  Before you start a new exercise program, talk with your health care provider. Do not exercise so much that you hurt yourself, feel dizzy, or get very short of breath. Wear comfortable clothes and wear shoes with good support. Drink plenty of water while you exercise to prevent dehydration or heat stroke. Work out until your breathing and your heartbeat get faster.      Prevent falls         Depression Screen     09/19/2023   11:17 AM 08/01/2023   11:31 AM 07/29/2023    2:42 PM 05/30/2023    9:59 AM 05/25/2023    9:55 AM 05/18/2023    2:38 PM 03/22/2023   10:04 AM  PHQ 2/9 Scores  PHQ - 2 Score 0 0 0 0 0 0 0  PHQ- 9 Score 0  0 0 0 0 0    Fall Risk     09/19/2023   11:18 AM 09/08/2023   11:40 AM 08/01/2023   11:30 AM 07/29/2023    2:42 PM 05/30/2023    9:59 AM  Fall Risk   Falls in the past year? 1 1 1 1 1   Number falls in past yr: 0 0 0  0 0  Injury with Fall? 1 1 1 1 1   Risk for fall due to : History of fall(s);Impaired balance/gait;Impaired mobility Impaired balance/gait;Impaired mobility History  of fall(s);Impaired balance/gait History of fall(s);Impaired balance/gait History of fall(s);Impaired balance/gait;Impaired mobility  Follow up Education provided;Falls prevention discussed;Falls evaluation completed Falls evaluation completed Falls evaluation completed Falls evaluation completed Falls evaluation completed    MEDICARE RISK AT HOME:  Medicare Risk at Home Any stairs in or around the home?: No If so, are there any without handrails?: No Home free of loose throw rugs in walkways, pet beds, electrical cords, etc?: Yes Adequate lighting in your home to reduce risk of falls?: Yes Life alert?: No Use of a cane, walker or w/c?: Yes Grab bars in the bathroom?: Yes Shower chair or bench in shower?: No Elevated toilet seat or a handicapped toilet?: Yes  TIMED UP AND GO:  Was the test performed?  No  Cognitive Function: 6CIT completed        09/19/2023   11:19 AM 09/16/2022   10:36 AM 09/11/2020    1:27 PM 01/17/2019    8:44 AM  6CIT Screen  What Year? 0 points 0 points 0 points 0 points  What month? 0 points 0 points 0 points 0 points  What time? 0 points 0 points 0 points 0 points  Count back from 20 0 points 0 points 2 points 0 points  Months in reverse 0 points 0 points 0 points 0 points  Repeat phrase 2 points 2 points 0 points 0 points  Total Score 2 points 2 points 2 points 0 points    Immunizations Immunization History  Administered Date(s) Administered   Fluad Quad(high Dose 65+) 03/14/2019, 04/29/2020   Influenza, High Dose Seasonal PF 03/26/2016, 04/01/2017, 03/29/2018   Influenza,inj,Quad PF,6+ Mos 04/24/2013, 05/01/2014, 04/08/2015   Moderna Sars-Covid-2 Vaccination 09/11/2019, 10/08/2019   Pneumococcal Conjugate-13 04/29/2020   Tdap 05/18/2023    Screening Tests Health Maintenance  Topic  Date Due   COVID-19 Vaccine (3 - 2024-25 season) 02/27/2023   INFLUENZA VACCINE  09/26/2023 (Originally 01/27/2023)   Zoster Vaccines- Shingrix (1 of 2) 10/26/2023 (Originally [birth year]/28/1981)   Pneumonia Vaccine 72+ Years old (2 of 2 - PPSV23 or PCV20) 03/07/2024 (Originally 06/24/2020)   Medicare Annual Wellness (AWV)  09/18/2024   DTaP/Tdap/Td (2 - Td or Tdap) 05/17/2033   DEXA SCAN  Completed   HPV VACCINES  Aged Out    Health Maintenance  Health Maintenance Due  Topic Date Due   COVID-19 Vaccine (3 - 2024-25 season) 02/27/2023    Additional Screening:  Vision Screening: Recommended annual ophthalmology exams for early detection of glaucoma and other disorders of the eye.  Dental Screening: Recommended annual dental exams for proper oral hygiene  Community Resource Referral / Chronic Care Management: CRR required this visit?  No   CCM required this visit?  No     Plan:     I have personally reviewed and noted the following in the patient's chart:   Medical and social history Use of alcohol, tobacco or illicit drugs  Current medications and supplements including opioid prescriptions. Patient is not currently taking opioid prescriptions. Functional ability and status Nutritional status Physical activity Advanced directives List of other physicians Hospitalizations, surgeries, and ER visits in previous 12 months Vitals Screenings to include cognitive, depression, and falls Referrals and appointments  In addition, I have reviewed and discussed with patient certain preventive protocols, quality metrics, and best practice recommendations. A written personalized care plan for preventive services as well as general preventive health recommendations were provided to patient.     Kandis Fantasia [place], [place]   07/28/8655   After Visit  Summary: (Declined) Due to this being a telephonic visit, with patients personalized plan was offered to patient but patient Declined AVS at  this time   Notes: Nothing significant to report at this time.

## 2023-09-19 NOTE — Telephone Encounter (Signed)
 PAtient missed last apt and needs apt per Dr. Royann Shivers prior to gen change.

## 2023-09-21 ENCOUNTER — Ambulatory Visit (INDEPENDENT_AMBULATORY_CARE_PROVIDER_SITE_OTHER): Payer: MEDICARE | Admitting: Family Medicine

## 2023-09-21 ENCOUNTER — Encounter: Payer: Self-pay | Admitting: Family Medicine

## 2023-09-21 VITALS — BP 125/69 | HR 66 | Temp 98.7°F | Ht 68.0 in | Wt 141.0 lb

## 2023-09-21 DIAGNOSIS — I480 Paroxysmal atrial fibrillation: Secondary | ICD-10-CM | POA: Diagnosis not present

## 2023-09-21 DIAGNOSIS — N6314 Unspecified lump in the right breast, lower inner quadrant: Secondary | ICD-10-CM | POA: Diagnosis not present

## 2023-09-21 DIAGNOSIS — E039 Hypothyroidism, unspecified: Secondary | ICD-10-CM | POA: Diagnosis not present

## 2023-09-21 DIAGNOSIS — Z7901 Long term (current) use of anticoagulants: Secondary | ICD-10-CM

## 2023-09-21 LAB — COAGUCHEK XS/INR WAIVED
INR: 2.3 — ABNORMAL HIGH (ref 0.9–1.1)
Prothrombin Time: 27.6 s

## 2023-09-21 MED ORDER — WARFARIN SODIUM 2.5 MG PO TABS: ORAL_TABLET | ORAL | 0 refills | Status: DC

## 2023-09-21 MED ORDER — WARFARIN SODIUM 2.5 MG PO TABS: ORAL_TABLET | ORAL | 3 refills | Status: DC

## 2023-09-21 MED ORDER — LEVOTHYROXINE SODIUM 25 MCG PO TABS: 25.0000 ug | ORAL_TABLET | Freq: Every day | ORAL | 3 refills | Status: AC

## 2023-09-21 NOTE — Progress Notes (Addendum)
 Subjective: CC: Atrial fibrillation PCP: Melissa Ip, DO BJY:NWGNF H [name] is a 89 y.o. female presenting to clinic today for:  1.  Atrial fibrillation Goal INR 2-3 Patient is compliant with her Lopressor, Coumadin.  She reports no abnormal bleeding.  She will be seeing cardiology soon to test the integrity of the battery in her pacemaker.  She is accompanied today by her son.  2.  Suspected breast cancer Establish care with [facility] last fall.  Her last OV was in October and at that time they were planning for biopsy of the lesion.  She for some reason has been lost to follow-up since that time.  Her son notes that the mass seems to be getting more ulcerated and he worries that it might start smelling.   ROS: Per HPI  Allergies  Allergen Reactions   Statins Other (See Comments)    Myalgia   Past Medical History:  Diagnosis Date   A-fib (HCC)    Dyslipidemia    Hyperlipidemia    Hypothyroidism    2 degree amidarone    Pacemaker    PAF (paroxysmal atrial fibrillation) (HCC)    S/P mitral valve repair    SSS (sick sinus syndrome) (HCC)    Systemic hypertension     Current Outpatient Medications:    levothyroxine (SYNTHROID) 25 MCG tablet, Take 1 tablet (25 mcg total) by mouth daily., Disp: 90 tablet, Rfl: 3   metoprolol tartrate (LOPRESSOR) 50 MG tablet, TAKE 1 TABLET TWICE A DAY (KEEP UPCOMING APPOINTMENT FOR FUTURE REFILLS), Disp: 60 tablet, Rfl: 11   Polyethyl Glycol-Propyl Glycol (SYSTANE OP), Apply 1 drop to eye daily as needed (dry eyes)., Disp: , Rfl:    silver sulfADIAZINE (SSD) 1 % cream, Apply 1 Application topically daily. To affected areas on foot until healed, Disp: 50 g, Rfl: 0   triamterene-hydrochlorothiazide (DYAZIDE) 37.5-25 MG capsule, TAKE 1 CAPSULE DAILY (PLEASE KEEP UPCOMING APPOINTMENT FOR FUTURE REFILLS, THANK YOU), Disp: 30 capsule, Rfl: 11   warfarin (COUMADIN) 2.5 MG tablet, TAKE 1 TABLET DAILY EXCEPT TAKE 2 TABLETS ON FRIDAYS, Disp: 90  tablet, Rfl: 0 Social History   Socioeconomic History   Marital status: Widowed    Spouse name: Not on file   Number of children: 3   Years of education: Not on file   Highest education level: High school graduate  Occupational History   Occupation: Retired  Tobacco Use   Smoking status: Never   Smokeless tobacco: Never  Vaping Use   Vaping status: Never Used  Substance and Sexual Activity   Alcohol use: No   Drug use: Never   Sexual activity: Not Currently  Other Topics Concern   Not on file  Social History Narrative   Husband passed 06/29/2021   2 sons live with her   Social Drivers of Corporate investment banker Strain: Low Risk  (09/19/2023)   Overall Financial Resource Strain (CARDIA)    Difficulty of Paying Living Expenses: Not hard at all  Food Insecurity: No Food Insecurity (09/19/2023)   Hunger Vital Sign    Worried About Running Out of Food in the Last Year: Never true    Ran Out of Food in the Last Year: Never true  Transportation Needs: No Transportation Needs (09/19/2023)   PRAPARE - Administrator, Civil Service (Medical): No    Lack of Transportation (Non-Medical): No  Physical Activity: Insufficiently Active (09/19/2023)   Exercise Vital Sign    Days of Exercise per  Week: 3 days    Minutes of Exercise per Session: 30 min  Stress: No Stress Concern Present (09/19/2023)   [name]    Feeling of Stress : Not at all  Social Connections: Moderately Isolated (09/19/2023)   Social Connection and Isolation Panel [NHANES]    Frequency of Communication with Friends and Family: More than three times a week    Frequency of Social Gatherings with Friends and Family: More than three times a week    Attends Religious Services: More than 4 times per year    Active Member of [employer] or Organizations: No    Attends Banker Meetings: Never    Marital Status: Widowed  Intimate Partner  Violence: Not At Risk (09/19/2023)   Humiliation, Afraid, Rape, and Kick questionnaire    Fear of Current or Ex-Partner: No    Emotionally Abused: No    Physically Abused: No    Sexually Abused: No   Family History  Problem Relation Age of Onset   Heart attack Mother    Heart attack Father    Parkinson's disease Son     Objective: Office vital signs reviewed. BP 125/69   Pulse 66   Temp 98.7 F (37.1 C)   Ht 5\' 8"  (1.727 m)   Wt 141 lb (64 kg)   SpO2 99%   BMI 21.44 kg/m   Physical Examination:  General: Awake, alert, nontoxic elderly female, No acute distress HEENT: sclera white, MMM Cardio: regular rate and rhythm, occasional extra beat.  S1S2 heard, no murmurs appreciated Pulm: clear to auscultation bilaterally, no wheezes, rhonchi or rales; normal work of breathing on room air MSK: Arrives in wheelchair Breast: Patient refuses breast exam  Assessment/ Plan: 89 y.o. female   Paroxysmal atrial fibrillation (HCC) - Plan: Iron, TIBC and Ferritin Panel, CBC, CoaguChek XS/INR Waived, warfarin (COUMADIN) 2.5 MG tablet  Anticoagulation monitoring, INR range 2-3 - Plan: Iron, TIBC and Ferritin Panel, CBC, CoaguChek XS/INR Waived  Acquired hypothyroidism - Plan: levothyroxine (SYNTHROID) 25 MCG tablet  [name] - Plan: Ambulatory referral to Home Health  INR therapeutic at 2.3.  No changes.  Medications have been renewed.  She may follow-up in 8 weeks and appointment time was given  Stat referral to home health wound care.  In her October note, the [facility] physician noted that she had 4 cm x 5 cm mass that was ulcerating in the lower inner quadrant of the right breast.  Will reach out to them again for follow-up   Melissa Perdew Hulen Skains, DO [facility] [phone]

## 2023-09-21 NOTE — Patient Instructions (Signed)
 [facility] to [facility] orders [facility]. Tell your heart doctor to resend your scripts to [facility] pharmacy when you are ready

## 2023-09-22 DIAGNOSIS — S91301D Unspecified open wound, right foot, subsequent encounter: Secondary | ICD-10-CM | POA: Diagnosis not present

## 2023-09-22 DIAGNOSIS — I10 Essential (primary) hypertension: Secondary | ICD-10-CM | POA: Diagnosis not present

## 2023-09-22 DIAGNOSIS — I495 Sick sinus syndrome: Secondary | ICD-10-CM | POA: Diagnosis not present

## 2023-09-22 DIAGNOSIS — L03115 Cellulitis of right lower limb: Secondary | ICD-10-CM | POA: Diagnosis not present

## 2023-09-22 DIAGNOSIS — I480 Paroxysmal atrial fibrillation: Secondary | ICD-10-CM | POA: Diagnosis not present

## 2023-09-22 DIAGNOSIS — E039 Hypothyroidism, unspecified: Secondary | ICD-10-CM | POA: Diagnosis not present

## 2023-09-22 LAB — CBC
Hematocrit: 32.9 % — ABNORMAL LOW (ref 34.0–46.6)
Hemoglobin: 10.4 g/dL — ABNORMAL LOW (ref 11.1–15.9)
MCH: 28.3 pg (ref 26.6–33.0)
MCHC: 31.6 g/dL (ref 31.5–35.7)
MCV: 90 fL (ref 79–97)
Platelets: 218 10*3/uL (ref 150–450)
RBC: 3.67 x10E6/uL — ABNORMAL LOW (ref 3.77–5.28)
RDW: 14.1 % (ref 11.7–15.4)
WBC: 4.5 10*3/uL (ref 3.4–10.8)

## 2023-09-22 LAB — IRON,TIBC AND FERRITIN PANEL
Ferritin: 33 ng/mL (ref 15–150)
Iron Saturation: 11 % — ABNORMAL LOW (ref 15–55)
Iron: 40 ug/dL (ref 27–139)
Total Iron Binding Capacity: 372 ug/dL (ref 250–450)
UIBC: 332 ug/dL (ref 118–369)

## 2023-09-26 DIAGNOSIS — E039 Hypothyroidism, unspecified: Secondary | ICD-10-CM | POA: Diagnosis not present

## 2023-09-26 DIAGNOSIS — S91301D Unspecified open wound, right foot, subsequent encounter: Secondary | ICD-10-CM | POA: Diagnosis not present

## 2023-09-26 DIAGNOSIS — I495 Sick sinus syndrome: Secondary | ICD-10-CM | POA: Diagnosis not present

## 2023-09-26 DIAGNOSIS — I10 Essential (primary) hypertension: Secondary | ICD-10-CM | POA: Diagnosis not present

## 2023-09-26 DIAGNOSIS — I480 Paroxysmal atrial fibrillation: Secondary | ICD-10-CM | POA: Diagnosis not present

## 2023-09-26 DIAGNOSIS — L03115 Cellulitis of right lower limb: Secondary | ICD-10-CM | POA: Diagnosis not present

## 2023-09-29 ENCOUNTER — Encounter: Payer: Self-pay | Admitting: Cardiovascular Disease

## 2023-09-29 ENCOUNTER — Ambulatory Visit: Payer: MEDICARE | Attending: Cardiovascular Disease | Admitting: Cardiovascular Disease

## 2023-09-29 VITALS — BP 138/60 | HR 75 | Ht 68.0 in | Wt 140.4 lb

## 2023-09-29 DIAGNOSIS — Z45010 Encounter for checking and testing of cardiac pacemaker pulse generator [battery]: Secondary | ICD-10-CM | POA: Diagnosis not present

## 2023-09-29 DIAGNOSIS — Z9889 Other specified postprocedural states: Secondary | ICD-10-CM | POA: Diagnosis not present

## 2023-09-29 DIAGNOSIS — D6869 Other thrombophilia: Secondary | ICD-10-CM | POA: Insufficient documentation

## 2023-09-29 DIAGNOSIS — I480 Paroxysmal atrial fibrillation: Secondary | ICD-10-CM | POA: Diagnosis not present

## 2023-09-29 DIAGNOSIS — Z01812 Encounter for preprocedural laboratory examination: Secondary | ICD-10-CM | POA: Insufficient documentation

## 2023-09-29 DIAGNOSIS — I10 Essential (primary) hypertension: Secondary | ICD-10-CM | POA: Insufficient documentation

## 2023-09-29 DIAGNOSIS — I495 Sick sinus syndrome: Secondary | ICD-10-CM | POA: Insufficient documentation

## 2023-09-29 DIAGNOSIS — N6315 Unspecified lump in the right breast, overlapping quadrants: Secondary | ICD-10-CM | POA: Diagnosis not present

## 2023-09-29 NOTE — Progress Notes (Signed)
 Cardiology office note   Date:  09/29/2023   ID:  [name], DOB 02-14-1929, MRN [id number] PCP:  [name], DO  Cardiologist:  Kynnedi Zweig Electrophysiologist:  None   Evaluation Performed:  Follow-Up Visit  Chief Complaint:  Afib, pacemaker follow-up  History of Present Illness:    [name] is a 89 y.o. female with remote history of mitral valve annuloplasty and surgical ablation, sinus node dysfunction and paroxysmal atrial fibrillation with a dual-chamber permanent pacemaker (Medtronic 2008, original 5076 leads, Azure generator change out 2018).  She is here because her pacemaker recently reached recommended replacement trigger on 09/15/2023  She does not have any cardiovascular complaints and has not had any falls.  She has a large ulcerated indurated mass on the right breast.  I referred her to our surgery team for this last fall.  She was seen by Dr. Luisa Hart who recommended a mammogram ultrasound and core biopsy.  The mammogram showed a spiculated mass in the lower central right breast associated with skin thickening and ulceration and  the ultrasound documented to enlarged right axillary lymph node suspicious for metastatic disease.  Following those tests notes state that she was to be scheduled for biopsy of the right breast mass and enlarged right axillary lymph node.  These are ordered by Dr. Luisa Hart, but it appears they were not scheduled.  On exam today the ulcerated mass in her breast is slightly larger and is actively oozing blood.  Her anticoagulation is followed at the Coumadin clinic in [place].  She tells me that her INR was out of range a few weeks ago when she had to take antibiotics, but it was 2.3 when checked on 09/21/2023.  Also on 09/21/2023 she had labs that included a hemoglobin of 10.4 otherwise normal CBC, iron studies performed 09/21/2023 showed mildly reduced iron saturation.  Her pacemaker reached RRT on 09/15/2023.  Presenting rhythm is  atrial paced, ventricular sensed.  She has 79% atrial pacing and 6% ventricular pacing.  The burden of atrial fibrillation has been 0.3%.  There is 1 episode of high ventricular rate that appears to be atrial tachycardia with 1: 1 AV conduction, but no true ventricular tachycardia.  She is known to have a relatively high pacing threshold on the atrial channel, but this has not changed (1.75 V at 1.0 ms pulse width, programmed amplitude to 3.5 V at 1.0 ms).  Did not check it today to minimize battery drain.   She has hypertension. She has a history of mitral valve annuloplasty and Cox Maze cryoablation procedure via minithoracotomy in 2009, has preserved left ventricular systolic function, no residual mitral insufficiency, normal coronary arteries at preoperative angiogram.   Past Medical History:  Diagnosis Date   A-fib (HCC)    Dyslipidemia    Hyperlipidemia    Hypothyroidism    2 degree amidarone    Pacemaker    PAF (paroxysmal atrial fibrillation) (HCC)    S/P mitral valve repair    SSS (sick sinus syndrome) (HCC)    Systemic hypertension    Past Surgical History:  Procedure Laterality Date   ABDOMINAL HYSTERECTOMY  1989   CARDIAC CATHETERIZATION  01/11/2008   mild-mod pulm. hypertension,normal coronaries   MINIMALLY INVASIVE MAZE PROCEDURE  02/2008   MITRAL VALVE REPAIR  September 2009   [facility] ring annuloplasty   NM MYOVIEW LTD  03/28/2007   no ischemia   PACEMAKER PLACEMENT  November 2008   PPM GENERATOR CHANGEOUT N/A 08/25/2016  Procedure: PPM Generator Changeout - Battery Only;  Surgeon: Thurmon Fair, MD;  Location: [facility] INVASIVE CV LAB;  Service: Cardiovascular;  Laterality: N/A;   US ECHOCARDIOGRAPHY  03/07/2012   mild LVH,EF =>55%,mild MR,mod TR,trace AI,right atrium mod. dilated     Current Meds  Medication Sig   levothyroxine (SYNTHROID) 25 MCG tablet Take 1 tablet (25 mcg total) by mouth daily.   metoprolol tartrate (LOPRESSOR) 50 MG tablet TAKE 1 TABLET  TWICE A DAY (KEEP UPCOMING APPOINTMENT FOR FUTURE REFILLS)   Polyethyl Glycol-Propyl Glycol (SYSTANE OP) Apply 1 drop to eye daily as needed (dry eyes).   silver sulfADIAZINE (SSD) 1 % cream Apply 1 Application topically daily. To affected areas on foot until healed   triamterene-hydrochlorothiazide (DYAZIDE) 37.5-25 MG capsule TAKE 1 CAPSULE DAILY (PLEASE KEEP UPCOMING APPOINTMENT FOR FUTURE REFILLS, THANK YOU)   warfarin (COUMADIN) 2.5 MG tablet TAKE 1 TABLET DAILY EXCEPT TAKE 2 TABLETS ON FRIDAYS     Allergies:   Statins   Social History   Tobacco Use   Smoking status: Never   Smokeless tobacco: Never  Vaping Use   Vaping status: Never Used  Substance Use Topics   Alcohol use: No   Drug use: Never     Family Hx: The patient's family history includes Heart attack in her father and mother; Parkinson's disease in her son.  ROS:   Please see the history of present illness.    All other systems reviewed and are negative.   Prior CV studies:   The following studies were reviewed today: Comprehensive pacemaker check today performed by me  Labs/Other Tests and Data Reviewed:    EKG: ECG performed today shows alternating atrial sensed (sinus) ventricular sensed and atrial paced ventricular sensed beats.  There is borderline left axis deviation but otherwise the tracing is normal.  QTc 420 ms.  Recent Labs: 01/19/2023: Magnesium 2.1 05/30/2023: ALT 8; BUN 25; Creatinine, Ser 1.08; Potassium 3.8; Sodium 138; TSH 3.620 09/21/2023: Hemoglobin 10.4; Platelets 218   Recent Lipid Panel Lab Results  Component Value Date/Time   CHOL 223 (H) 05/21/2016 09:54 AM   TRIG 120 05/21/2016 09:54 AM   HDL 52 05/21/2016 09:54 AM   CHOLHDL 4.3 05/21/2016 09:54 AM   CHOLHDL 4.8 12/26/2012 10:57 AM   LDLCALC 147 (H) 05/21/2016 09:54 AM    Wt Readings from Last 3 Encounters:  09/29/23 140 lb 6.4 oz (63.7 kg)  09/21/23 141 lb (64 kg)  09/19/23 141 lb (64 kg)     Objective:    Vital Signs:   BP 138/60 (BP Location: Left Arm, Patient Position: Bed low/side rails up, Cuff Size: Normal)   Pulse 75   Ht 5\' 8"  (1.727 m)   Wt 140 lb 6.4 oz (63.7 kg)   SpO2 99%   BMI 21.35 kg/m     General: Alert, oriented x3, no distress, healthy left subclavian pacemaker site Head: no evidence of trauma, PERRL, EOMI, no exophtalmos or lid lag, no myxedema, no xanthelasma; normal ears, nose and oropharynx Neck: normal jugular venous pulsations and no hepatojugular reflux; brisk carotid pulses without delay and no carotid bruits Chest: clear to auscultation, no signs of consolidation by percussion or palpation, normal fremitus, symmetrical and full respiratory excursions.  Largely unchanged 15-16 mm ulcerated and very indurated mass in the lower half of the right breast with active slight bloody bruising from the edges Cardiovascular: normal position and quality of the apical impulse, regular rhythm, normal first and second heart sounds, no murmurs,  rubs or gallops Abdomen: no tenderness or distention, no masses by palpation, no abnormal pulsatility or arterial bruits, normal bowel sounds, no hepatosplenomegaly Extremities: Symmetrical 1-2+ pedal edema Neurological: grossly nonfocal Psych: Normal mood and affect    ASSESSMENT & PLAN:    1. Mass overlapping multiple quadrants of right breast   2. Paroxysmal atrial fibrillation (HCC)   3. Acquired thrombophilia (HCC)   4. SSS (sick sinus syndrome) (HCC)   5. Tachy-brady syndrome (HCC)   6. Pacemaker battery depletion   7. Essential hypertension   8. History of mitral valve repair   9. Pre-procedure lab exam       Right breast mass: Findings on mammogram and ultrasound confirm the impression of breast malignancy with metastasis to the axillary lymph nodes.  She was supposed to have a biopsy, but it looks like this has not been scheduled.  Will reach out to Dr. Luisa Hart to see if this is still the plan.  Ideally, even if she does not have  surgery or any other treatment (for what appears to be a rather indolent but advanced breast cancer) we can refer her for wound care and other palliative services. AFib: Continues to have intermittent episodes of paroxysmal atrial fibrillation that are asymptomatic and well rate controlled.  The risk of stopping her anticoagulation for any biopsy or any surgical procedure is relatively low.  CHADSVasc 4 (age 33, HTN, gender).  Although she has a history of mitral valve repair she did not have rheumatic valvular heart disease.  Warfarin: INR therapeutic at 2.3 a week ago she is monitored by her primary care provider in [place].  Will recheck her INR Monday and give instructions regarding warfarin management before the pacemaker change out procedure. Tachy-bradycardia sd: The heart rate response with the current sensor settings appears to be appropriate. PM:  battery recently reached elective replacement indicator.  We discussed whether or not she wants to change out the battery and she is confident that she wants to do this, no matter what the issues are with her breast.  I pointed out that she is not pacemaker dependent and that she is in her 90s, but both she and her son would like her to have the battery change.  Although she has relatively high output requirements on the atrial lead and I am afraid that her prognosis is relatively short due to her age and the presence of what appears to be advanced breast cancer.  I do not think it makes sense to revise her atrial lead. HTN: Adequately controlled Hx MV repair: Asymptomatic.  No murmurs heard on exam.  She has not had symptoms of heart failure other than mild pedal edema.  Has not had an echocardiogram since September 2013.  If there will be a plan for major surgery, would recommend repeating an echocardiogram. Peripheral venous insufficiency: Keep legs elevated and continues in compression stockings.   The pacemaker generator change procedure has been  fully reviewed with the patient and written informed consent has been obtained.    Patient Instructions  Medication Instructions:  No changes at this time *If you need a refill on your cardiac medications before your next appointment, please call your pharmacy*  Lab Work: CBC, BMP, INR- Monday 10/03/23 If you have labs (blood work) drawn today and your tests are completely normal, you will receive your results only by: MyChart Message (if you have MyChart) OR A paper copy in the mail If you have any lab test that is abnormal or  we need to change your treatment, we will call you to review the results.  Testing/Procedures:     Implantable Device Instructions    HARLAN VINAL  09/29/2023  You are scheduled for a Permanent transvenous pacemaker (PPM) on Thursday, April 10 with Dr. Rachelle Hora Katrine Radich.  1. Pre procedure Lab testing: CBC, BMP, INR- 10/03/23   2. Please arrive at the [facility] (Main Entrance A) at [facility]: [address] [place], [place] [address] at 2:00 PM (This time is 1.5 hour(s) before your procedure to ensure your preparation).   Free valet parking service is available. You will check in at ADMITTING. The support person will be asked to wait in the waiting room.  It is OK to have someone drop you off and come back when you are ready to be discharged.          Special note: Every effort is made to have your procedure done on time. Please understand that emergencies sometimes delay  scheduled procedures.  3.  No eating or drinking after midnight prior to procedure.     4.  Medication instructions:  Dr Royann Shivers will call on Tuesday 10/04/23 to let your know when to hold the Coumadin, based on lab results.       !!IF ANY NEW MEDICATIONS ARE STARTED AFTER TODAY, PLEASE NOTIFY YOUR PROVIDER AS SOON AS POSSIBLE!!  FYI: Medications such as Semaglutide (Ozempic, [place]), Tirzepatide (Mounjaro, Zepbound), Dulaglutide (Trulicity), etc ("GLP1 agonists") AND  Canagliflozin (Invokana), Dapagliflozin (Farxiga), Empagliflozin (Jardiance), Ertugliflozin (Steglatro), Bexagliflozin [employer]) or any combination with one of these drugs such as Invokamet (Canagliflozin/Metformin), Synjardy (Empagliflozin/Metformin), etc ("SGLT2 inhibitors") must be held around the time of a procedure. This is not a comprehensive list of all of these drugs. Please review all of your medications and talk to your provider if you take any one of these. If you are not sure, ask your provider.   5.  The night before your procedure and the morning of your procedure scrub your neck/chest with CHG surgical scrub.  See instruction letter.  6. Plan to go home the same day, you will only stay overnight if medically necessary. 7. You MUST have a responsible adult to drive you home. 8. An adult MUST be with you the first 24 hours after you arrive home. 9. Bring a current list of your medications, and the last time and date medication taken. 10. Bring ID and current insurance cards. 11. Please wear clothes that are easy to get on and off and wear slip-on shoes.    You will follow up with the [facility] Device clinic 10-14 days after your procedure.  You will follow up with Dr. Rachelle Hora Maritza [place] 91 days after your procedure.  These appointments will be made for you.   * If you have ANY questions after you get home, please call the office at [phone] or send a MyChart message.  FYI: For your safety, and to allow [place] to monitor your vital signs accurately during the surgery/procedure we request that if you have artificial nails, gel coating, SNS etc. Please have those removed prior to your surgery/procedure. Not having the nail coverings /polish removed may result in cancellation or delay of your surgery/procedure.    [place] - Preparing For Surgery    Before surgery, you can play an important role. Because skin is not sterile, your skin needs to be as free of germs as possible. You can  reduce the number of germs on your  skin by washing with CHG (chlorahexidine gluconate) Soap before surgery.  CHG is an antiseptic cleaner which kills germs and bonds with the skin to continue killing germs even after washing.  Please do not use if you have an allergy to CHG or antibacterial soaps.  If your skin becomes reddened/irritated stop using the CHG.   Do not shave (including legs and underarms) for at least 48 hours prior to first CHG shower.  It is OK to shave your face.  Please follow these instructions carefully:  1.  Shower the night before surgery and the morning of surgery with CHG.  2.  If you choose to wash your hair, wash your hair first as usual with your normal shampoo.  3.  After you shampoo, rinse your hair and body thoroughly to remove the shampoo.  4.  Use CHG as you would any other liquid soap.  You can apply CHG directly to the skin and wash gently with a clean washcloth. 5.  Apply the CHG Soap to your body ONLY FROM THE NECK DOWN.  Do not use on open wounds or open sores.  Avoid contact with your eyes, ears, mouth and genitals (private parts).  Wash genitals (private parts) with your normal soap.  6.  Wash thoroughly, paying special attention to the area where your surgery will be performed.  7.  Thoroughly rinse your body with warm water from the neck down.   8.  DO NOT shower/wash with your normal soap after using and rinsing off the CHG soap.  9.  Pat yourself dry with a clean towel.           10.  Wear clean pajamas.           11.  Place clean sheets on your bed the night of your first shower and do not sleep with pets.  Day of Surgery: Do not apply any deodorants/lotions.  Please wear clean clothes to the hospital/surgery center.   Follow-Up: At [facility], you and your health needs are our priority.  As part of our continuing mission to provide you with exceptional heart care, our providers are all part of one team.  This team includes your primary  Cardiologist (physician) and Advanced Practice Providers or APPs (Physician Assistants and Nurse Practitioners) who all work together to provide you with the care you need, when you need it.  Your next appointment:   Will be scheduled after Pacemaker Generator Change Out  Provider:   Dr Royann Shivers   We recommend signing up for the patient portal called "MyChart".  Sign up information is provided on this After Visit Summary.  MyChart is used to connect with patients for Virtual Visits (Telemedicine).  Patients are able to view lab/test results, encounter notes, upcoming appointments, etc.  Non-urgent messages can be sent to your provider as well.   To learn more about what you can do with MyChart, go to [email or web address].        1st Floor: - Lobby - Registration  - Pharmacy  - Lab - Cafe  2nd Floor: - PV Lab - Diagnostic Testing (echo, CT, nuclear med)  3rd Floor: - Vacant  4th Floor: - TCTS (cardiothoracic surgery) - AFib Clinic - Structural Heart Clinic - Vascular Surgery  - Vascular Ultrasound  5th Floor: - HeartCare Cardiology (general and EP) - Clinical Pharmacy for coumadin, hypertension, lipid, weight-loss medications, and med management appointments    Valet parking services will be available as well.  Signed, Thurmon Fair, MD  09/29/2023 3:39 PM    [facility]

## 2023-09-29 NOTE — Patient Instructions (Signed)
 Medication Instructions:  No changes at this time *If you need a refill on your cardiac medications before your next appointment, please call your pharmacy*  Lab Work: CBC, BMP, INR- Monday 10/03/23 If you have labs (blood work) drawn today and your tests are completely normal, you will receive your results only by: MyChart Message (if you have MyChart) OR A paper copy in the mail If you have any lab test that is abnormal or we need to change your treatment, we will call you to review the results.  Testing/Procedures:     Implantable Device Instructions    [name]  09/29/2023  You are scheduled for a Permanent transvenous pacemaker (PPM) on Thursday, April 10 with Dr. Rachelle Hora Croitoru.  1. Pre procedure Lab testing: CBC, BMP, INR- 10/03/23   2. Please arrive at the [facility] (Main Entrance A) at [facility]: [address] [place], [place] [address] at 2:00 PM (This time is 1.5 hour(s) before your procedure to ensure your preparation).   Free valet parking service is available. You will check in at ADMITTING. The support person will be asked to wait in the waiting room.  It is OK to have someone drop you off and come back when you are ready to be discharged.          Special note: Every effort is made to have your procedure done on time. Please understand that emergencies sometimes delay  scheduled procedures.  3.  No eating or drinking after midnight prior to procedure.     4.  Medication instructions:  Dr Royann Shivers will call on Tuesday 10/04/23 to let your know when to hold the Coumadin, based on lab results.       !!IF ANY NEW MEDICATIONS ARE STARTED AFTER TODAY, PLEASE NOTIFY YOUR PROVIDER AS SOON AS POSSIBLE!!  FYI: Medications such as Semaglutide (Ozempic, [place]), Tirzepatide (Mounjaro, Zepbound), Dulaglutide (Trulicity), etc ("GLP1 agonists") AND Canagliflozin (Invokana), Dapagliflozin (Farxiga), Empagliflozin (Jardiance), Ertugliflozin (Steglatro), Bexagliflozin  [employer]) or any combination with one of these drugs such as Invokamet (Canagliflozin/Metformin), Synjardy (Empagliflozin/Metformin), etc ("SGLT2 inhibitors") must be held around the time of a procedure. This is not a comprehensive list of all of these drugs. Please review all of your medications and talk to your provider if you take any one of these. If you are not sure, ask your provider.   5.  The night before your procedure and the morning of your procedure scrub your neck/chest with CHG surgical scrub.  See instruction letter.  6. Plan to go home the same day, you will only stay overnight if medically necessary. 7. You MUST have a responsible adult to drive you home. 8. An adult MUST be with you the first 24 hours after you arrive home. 9. Bring a current list of your medications, and the last time and date medication taken. 10. Bring ID and current insurance cards. 11. Please wear clothes that are easy to get on and off and wear slip-on shoes.    You will follow up with the [facility] Device clinic 10-14 days after your procedure.  You will follow up with Dr. Rachelle Hora Croitoru 91 days after your procedure.  These appointments will be made for you.   * If you have ANY questions after you get home, please call the office at [phone] or send a MyChart message.  FYI: For your safety, and to allow [place] to monitor your vital signs accurately during the surgery/procedure we request that if you  have artificial nails, gel coating, SNS etc. Please have those removed prior to your surgery/procedure. Not having the nail coverings /polish removed may result in cancellation or delay of your surgery/procedure.    [place] - Preparing For Surgery    Before surgery, you can play an important role. Because skin is not sterile, your skin needs to be as free of germs as possible. You can reduce the number of germs on your skin by washing with CHG (chlorahexidine gluconate) Soap before surgery.  CHG is an  antiseptic cleaner which kills germs and bonds with the skin to continue killing germs even after washing.  Please do not use if you have an allergy to CHG or antibacterial soaps.  If your skin becomes reddened/irritated stop using the CHG.   Do not shave (including legs and underarms) for at least 48 hours prior to first CHG shower.  It is OK to shave your face.  Please follow these instructions carefully:  1.  Shower the night before surgery and the morning of surgery with CHG.  2.  If you choose to wash your hair, wash your hair first as usual with your normal shampoo.  3.  After you shampoo, rinse your hair and body thoroughly to remove the shampoo.  4.  Use CHG as you would any other liquid soap.  You can apply CHG directly to the skin and wash gently with a clean washcloth. 5.  Apply the CHG Soap to your body ONLY FROM THE NECK DOWN.  Do not use on open wounds or open sores.  Avoid contact with your eyes, ears, mouth and genitals (private parts).  Wash genitals (private parts) with your normal soap.  6.  Wash thoroughly, paying special attention to the area where your surgery will be performed.  7.  Thoroughly rinse your body with warm water from the neck down.   8.  DO NOT shower/wash with your normal soap after using and rinsing off the CHG soap.  9.  Pat yourself dry with a clean towel.           10.  Wear clean pajamas.           11.  Place clean sheets on your bed the night of your first shower and do not sleep with pets.  Day of Surgery: Do not apply any deodorants/lotions.  Please wear clean clothes to the hospital/surgery center.   Follow-Up: At [facility], you and your health needs are our priority.  As part of our continuing mission to provide you with exceptional heart care, our providers are all part of one team.  This team includes your primary Cardiologist (physician) and Advanced Practice Providers or APPs (Physician Assistants and Nurse Practitioners) who all  work together to provide you with the care you need, when you need it.  Your next appointment:   Will be scheduled after Pacemaker Generator Change Out  Provider:   Dr Royann Shivers   We recommend signing up for the patient portal called "MyChart".  Sign up information is provided on this After Visit Summary.  MyChart is used to connect with patients for Virtual Visits (Telemedicine).  Patients are able to view lab/test results, encounter notes, upcoming appointments, etc.  Non-urgent messages can be sent to your provider as well.   To learn more about what you can do with MyChart, go to [email or web address].        1st Floor: - Lobby - Registration  - Pharmacy  - Lab -  Cafe  2nd Floor: - PV Lab - Diagnostic Testing (echo, CT, nuclear med)  3rd Floor: - Vacant  4th Floor: - TCTS (cardiothoracic surgery) - AFib Clinic - Structural Heart Clinic - Vascular Surgery  - Vascular Ultrasound  5th Floor: - HeartCare Cardiology (general and EP) - Clinical Pharmacy for coumadin, hypertension, lipid, weight-loss medications, and med management appointments    Valet parking services will be available as well.

## 2023-09-29 NOTE — H&P (View-Only) (Signed)
 Cardiology office note   Date:  09/29/2023   ID:  [name], DOB 02-14-1929, MRN [id number] PCP:  [name], DO  Cardiologist:  Kynnedi Zweig Electrophysiologist:  None   Evaluation Performed:  Follow-Up Visit  Chief Complaint:  Afib, pacemaker follow-up  History of Present Illness:    [name] is a 89 y.o. female with remote history of mitral valve annuloplasty and surgical ablation, sinus node dysfunction and paroxysmal atrial fibrillation with a dual-chamber permanent pacemaker (Medtronic 2008, original 5076 leads, Azure generator change out 2018).  She is here because her pacemaker recently reached recommended replacement trigger on 09/15/2023  She does not have any cardiovascular complaints and has not had any falls.  She has a large ulcerated indurated mass on the right breast.  I referred her to our surgery team for this last fall.  She was seen by Dr. Luisa Hart who recommended a mammogram ultrasound and core biopsy.  The mammogram showed a spiculated mass in the lower central right breast associated with skin thickening and ulceration and  the ultrasound documented to enlarged right axillary lymph node suspicious for metastatic disease.  Following those tests notes state that she was to be scheduled for biopsy of the right breast mass and enlarged right axillary lymph node.  These are ordered by Dr. Luisa Hart, but it appears they were not scheduled.  On exam today the ulcerated mass in her breast is slightly larger and is actively oozing blood.  Her anticoagulation is followed at the Coumadin clinic in [place].  She tells me that her INR was out of range a few weeks ago when she had to take antibiotics, but it was 2.3 when checked on 09/21/2023.  Also on 09/21/2023 she had labs that included a hemoglobin of 10.4 otherwise normal CBC, iron studies performed 09/21/2023 showed mildly reduced iron saturation.  Her pacemaker reached RRT on 09/15/2023.  Presenting rhythm is  atrial paced, ventricular sensed.  She has 79% atrial pacing and 6% ventricular pacing.  The burden of atrial fibrillation has been 0.3%.  There is 1 episode of high ventricular rate that appears to be atrial tachycardia with 1: 1 AV conduction, but no true ventricular tachycardia.  She is known to have a relatively high pacing threshold on the atrial channel, but this has not changed (1.75 V at 1.0 ms pulse width, programmed amplitude to 3.5 V at 1.0 ms).  Did not check it today to minimize battery drain.   She has hypertension. She has a history of mitral valve annuloplasty and Cox Maze cryoablation procedure via minithoracotomy in 2009, has preserved left ventricular systolic function, no residual mitral insufficiency, normal coronary arteries at preoperative angiogram.   Past Medical History:  Diagnosis Date   A-fib (HCC)    Dyslipidemia    Hyperlipidemia    Hypothyroidism    2 degree amidarone    Pacemaker    PAF (paroxysmal atrial fibrillation) (HCC)    S/P mitral valve repair    SSS (sick sinus syndrome) (HCC)    Systemic hypertension    Past Surgical History:  Procedure Laterality Date   ABDOMINAL HYSTERECTOMY  1989   CARDIAC CATHETERIZATION  01/11/2008   mild-mod pulm. hypertension,normal coronaries   MINIMALLY INVASIVE MAZE PROCEDURE  02/2008   MITRAL VALVE REPAIR  September 2009   [facility] ring annuloplasty   NM MYOVIEW LTD  03/28/2007   no ischemia   PACEMAKER PLACEMENT  November 2008   PPM GENERATOR CHANGEOUT N/A 08/25/2016  Procedure: PPM Generator Changeout - Battery Only;  Surgeon: Thurmon Fair, MD;  Location: [facility] INVASIVE CV [facility];  Service: Cardiovascular;  Laterality: N/A;   US ECHOCARDIOGRAPHY  03/07/2012   mild LVH,EF =>55%,mild MR,mod TR,trace AI,right atrium mod. dilated     Current Meds  Medication Sig   levothyroxine (SYNTHROID) 25 MCG tablet Take 1 tablet (25 mcg total) by mouth daily.   metoprolol tartrate (LOPRESSOR) 50 MG tablet TAKE 1 TABLET  TWICE A DAY (KEEP UPCOMING APPOINTMENT FOR FUTURE REFILLS)   Polyethyl Glycol-Propyl Glycol (SYSTANE OP) Apply 1 drop to eye daily as needed (dry eyes).   silver sulfADIAZINE (SSD) 1 % cream Apply 1 Application topically daily. To affected areas on foot until healed   triamterene-hydrochlorothiazide (DYAZIDE) 37.5-25 MG capsule TAKE 1 CAPSULE DAILY (PLEASE KEEP UPCOMING APPOINTMENT FOR FUTURE REFILLS, THANK YOU)   warfarin (COUMADIN) 2.5 MG tablet TAKE 1 TABLET DAILY EXCEPT TAKE 2 TABLETS ON FRIDAYS     Allergies:   Statins   Social History   Tobacco Use   Smoking status: Never   Smokeless tobacco: Never  Vaping Use   Vaping status: Never Used  Substance Use Topics   Alcohol use: No   Drug use: Never     Family Hx: The patient's family history includes Heart attack in her father and mother; Parkinson's disease in her son.  ROS:   Please see the history of present illness.    All other systems reviewed and are negative.   Prior CV studies:   The following studies were reviewed today: Comprehensive pacemaker check today performed by me  Labs/Other Tests and Data Reviewed:    EKG: ECG performed today shows alternating atrial sensed (sinus) ventricular sensed and atrial paced ventricular sensed beats.  There is borderline left axis deviation but otherwise the tracing is normal.  QTc 420 ms.  Recent Labs: 01/19/2023: Magnesium 2.1 05/30/2023: ALT 8; BUN 25; Creatinine, Ser 1.08; Potassium 3.8; Sodium 138; TSH 3.620 09/21/2023: Hemoglobin 10.4; Platelets 218   Recent Lipid Panel Lab Results  Component Value Date/Time   CHOL 223 (H) 05/21/2016 09:54 AM   TRIG 120 05/21/2016 09:54 AM   HDL 52 05/21/2016 09:54 AM   CHOLHDL 4.3 05/21/2016 09:54 AM   CHOLHDL 4.8 12/26/2012 10:57 AM   LDLCALC 147 (H) 05/21/2016 09:54 AM    Wt Readings from Last 3 Encounters:  09/29/23 140 lb 6.4 oz (63.7 kg)  09/21/23 141 lb (64 kg)  09/19/23 141 lb (64 kg)     Objective:    Vital Signs:   BP 138/60 (BP Location: Left Arm, Patient Position: Bed low/side rails up, Cuff Size: Normal)   Pulse 75   Ht 5\' 8"  (1.727 m)   Wt 140 lb 6.4 oz (63.7 kg)   SpO2 99%   BMI 21.35 kg/m     General: Alert, oriented x3, no distress, healthy left subclavian pacemaker site Head: no evidence of trauma, PERRL, EOMI, no exophtalmos or lid lag, no myxedema, no xanthelasma; normal ears, nose and oropharynx Neck: normal jugular venous pulsations and no hepatojugular reflux; brisk carotid pulses without delay and no carotid bruits Chest: clear to auscultation, no signs of consolidation by percussion or palpation, normal fremitus, symmetrical and full respiratory excursions.  Largely unchanged 15-16 mm ulcerated and very indurated mass in the lower half of the right breast with active slight bloody bruising from the edges Cardiovascular: normal position and quality of the apical impulse, regular rhythm, normal first and second heart sounds, no murmurs,  rubs or gallops Abdomen: no tenderness or distention, no masses by palpation, no abnormal pulsatility or arterial bruits, normal bowel sounds, no hepatosplenomegaly Extremities: Symmetrical 1-2+ pedal edema Neurological: grossly nonfocal Psych: Normal mood and affect    ASSESSMENT & PLAN:    1. Mass overlapping multiple quadrants of right breast   2. Paroxysmal atrial fibrillation (HCC)   3. Acquired thrombophilia (HCC)   4. SSS (sick sinus syndrome) (HCC)   5. Tachy-brady syndrome (HCC)   6. Pacemaker battery depletion   7. Essential hypertension   8. History of mitral valve repair   9. Pre-procedure lab exam       Right breast mass: Findings on mammogram and ultrasound confirm the impression of breast malignancy with metastasis to the axillary lymph nodes.  She was supposed to have a biopsy, but it looks like this has not been scheduled.  Will reach out to Dr. Luisa Hart to see if this is still the plan.  Ideally, even if she does not have  surgery or any other treatment (for what appears to be a rather indolent but advanced breast cancer) we can refer her for wound care and other palliative services. AFib: Continues to have intermittent episodes of paroxysmal atrial fibrillation that are asymptomatic and well rate controlled.  The risk of stopping her anticoagulation for any biopsy or any surgical procedure is relatively low.  CHADSVasc 4 (age 33, HTN, gender).  Although she has a history of mitral valve repair she did not have rheumatic valvular heart disease.  Warfarin: INR therapeutic at 2.3 a week ago she is monitored by her primary care provider in [place].  Will recheck her INR Monday and give instructions regarding warfarin management before the pacemaker change out procedure. Tachy-bradycardia sd: The heart rate response with the current sensor settings appears to be appropriate. PM:  battery recently reached elective replacement indicator.  We discussed whether or not she wants to change out the battery and she is confident that she wants to do this, no matter what the issues are with her breast.  I pointed out that she is not pacemaker dependent and that she is in her 90s, but both she and her son would like her to have the battery change.  Although she has relatively high output requirements on the atrial lead and I am afraid that her prognosis is relatively short due to her age and the presence of what appears to be advanced breast cancer.  I do not think it makes sense to revise her atrial lead. HTN: Adequately controlled Hx MV repair: Asymptomatic.  No murmurs heard on exam.  She has not had symptoms of heart failure other than mild pedal edema.  Has not had an echocardiogram since September 2013.  If there will be a plan for major surgery, would recommend repeating an echocardiogram. Peripheral venous insufficiency: Keep legs elevated and continues in compression stockings.   The pacemaker generator change procedure has been  fully reviewed with the patient and written informed consent has been obtained.    Patient Instructions  Medication Instructions:  No changes at this time *If you need a refill on your cardiac medications before your next appointment, please call your pharmacy*  Lab Work: CBC, BMP, INR- Monday 10/03/23 If you have labs (blood work) drawn today and your tests are completely normal, you will receive your results only by: MyChart Message (if you have MyChart) OR A paper copy in the mail If you have any lab test that is abnormal or  we need to change your treatment, we will call you to review the results.  Testing/Procedures:     Implantable Device Instructions    HARLAN VINAL  09/29/2023  You are scheduled for a Permanent transvenous pacemaker (PPM) on Thursday, April 10 with Dr. Rachelle Hora Katrine Radich.  1. Pre procedure Lab testing: CBC, BMP, INR- 10/03/23   2. Please arrive at the [facility] (Main Entrance A) at [facility]: [address] [place], [place] [address] at 2:00 PM (This time is 1.5 hour(s) before your procedure to ensure your preparation).   Free valet parking service is available. You will check in at ADMITTING. The support person will be asked to wait in the waiting room.  It is OK to have someone drop you off and come back when you are ready to be discharged.          Special note: Every effort is made to have your procedure done on time. Please understand that emergencies sometimes delay  scheduled procedures.  3.  No eating or drinking after midnight prior to procedure.     4.  Medication instructions:  Dr Royann Shivers will call on Tuesday 10/04/23 to let your know when to hold the Coumadin, based on lab results.       !!IF ANY NEW MEDICATIONS ARE STARTED AFTER TODAY, PLEASE NOTIFY YOUR PROVIDER AS SOON AS POSSIBLE!!  FYI: Medications such as Semaglutide (Ozempic, [place]), Tirzepatide (Mounjaro, Zepbound), Dulaglutide (Trulicity), etc ("GLP1 agonists") AND  Canagliflozin (Invokana), Dapagliflozin (Farxiga), Empagliflozin (Jardiance), Ertugliflozin (Steglatro), Bexagliflozin [employer]) or any combination with one of these drugs such as Invokamet (Canagliflozin/Metformin), Synjardy (Empagliflozin/Metformin), etc ("SGLT2 inhibitors") must be held around the time of a procedure. This is not a comprehensive list of all of these drugs. Please review all of your medications and talk to your provider if you take any one of these. If you are not sure, ask your provider.   5.  The night before your procedure and the morning of your procedure scrub your neck/chest with CHG surgical scrub.  See instruction letter.  6. Plan to go home the same day, you will only stay overnight if medically necessary. 7. You MUST have a responsible adult to drive you home. 8. An adult MUST be with you the first 24 hours after you arrive home. 9. Bring a current list of your medications, and the last time and date medication taken. 10. Bring ID and current insurance cards. 11. Please wear clothes that are easy to get on and off and wear slip-on shoes.    You will follow up with the [facility] clinic 10-14 days after your procedure.  You will follow up with Dr. Rachelle Hora Maritza [place] 91 days after your procedure.  These appointments will be made for you.   * If you have ANY questions after you get home, please call the office at [phone] or send a MyChart message.  FYI: For your safety, and to allow [place] to monitor your vital signs accurately during the surgery/procedure we request that if you have artificial nails, gel coating, SNS etc. Please have those removed prior to your surgery/procedure. Not having the nail coverings /polish removed may result in cancellation or delay of your surgery/procedure.    [place] - Preparing For Surgery    Before surgery, you can play an important role. Because skin is not sterile, your skin needs to be as free of germs as possible. You can  reduce the number of germs on your  skin by washing with CHG (chlorahexidine gluconate) Soap before surgery.  CHG is an antiseptic cleaner which kills germs and bonds with the skin to continue killing germs even after washing.  Please do not use if you have an allergy to CHG or antibacterial soaps.  If your skin becomes reddened/irritated stop using the CHG.   Do not shave (including legs and underarms) for at least 48 hours prior to first CHG shower.  It is OK to shave your face.  Please follow these instructions carefully:  1.  Shower the night before surgery and the morning of surgery with CHG.  2.  If you choose to wash your hair, wash your hair first as usual with your normal shampoo.  3.  After you shampoo, rinse your hair and body thoroughly to remove the shampoo.  4.  Use CHG as you would any other liquid soap.  You can apply CHG directly to the skin and wash gently with a clean washcloth. 5.  Apply the CHG Soap to your body ONLY FROM THE NECK DOWN.  Do not use on open wounds or open sores.  Avoid contact with your eyes, ears, mouth and genitals (private parts).  Wash genitals (private parts) with your normal soap.  6.  Wash thoroughly, paying special attention to the area where your surgery will be performed.  7.  Thoroughly rinse your body with warm water from the neck down.   8.  DO NOT shower/wash with your normal soap after using and rinsing off the CHG soap.  9.  Pat yourself dry with a clean towel.           10.  Wear clean pajamas.           11.  Place clean sheets on your bed the night of your first shower and do not sleep with pets.  Day of Surgery: Do not apply any deodorants/lotions.  Please wear clean clothes to the hospital/surgery center.   Follow-Up: At [facility], you and your health needs are our priority.  As part of our continuing mission to provide you with exceptional heart care, our providers are all part of one team.  This team includes your primary  Cardiologist (physician) and Advanced Practice Providers or APPs (Physician Assistants and Nurse Practitioners) who all work together to provide you with the care you need, when you need it.  Your next appointment:   Will be scheduled after Pacemaker Generator Change Out  Provider:   Dr Royann Shivers   We recommend signing up for the patient portal called "MyChart".  Sign up information is provided on this After Visit Summary.  MyChart is used to connect with patients for Virtual Visits (Telemedicine).  Patients are able to view lab/test results, encounter notes, upcoming appointments, etc.  Non-urgent messages can be sent to your provider as well.   To learn more about what you can do with MyChart, go to [email or web address].        1st Floor: - Lobby - Registration  - Pharmacy  - Lab - Cafe  2nd Floor: - PV Lab - Diagnostic Testing (echo, CT, nuclear med)  3rd Floor: - Vacant  4th Floor: - TCTS (cardiothoracic surgery) - AFib Clinic - Structural Heart Clinic - Vascular Surgery  - Vascular Ultrasound  5th Floor: - HeartCare Cardiology (general and EP) - Clinical Pharmacy for coumadin, hypertension, lipid, weight-loss medications, and med management appointments    Valet parking services will be available as well.  Signed, Thurmon Fair, MD  09/29/2023 3:39 PM    [facility]

## 2023-10-01 DIAGNOSIS — I495 Sick sinus syndrome: Secondary | ICD-10-CM | POA: Diagnosis not present

## 2023-10-01 DIAGNOSIS — E039 Hypothyroidism, unspecified: Secondary | ICD-10-CM | POA: Diagnosis not present

## 2023-10-01 DIAGNOSIS — I10 Essential (primary) hypertension: Secondary | ICD-10-CM | POA: Diagnosis not present

## 2023-10-01 DIAGNOSIS — L03115 Cellulitis of right lower limb: Secondary | ICD-10-CM | POA: Diagnosis not present

## 2023-10-01 DIAGNOSIS — S91301D Unspecified open wound, right foot, subsequent encounter: Secondary | ICD-10-CM | POA: Diagnosis not present

## 2023-10-01 DIAGNOSIS — I480 Paroxysmal atrial fibrillation: Secondary | ICD-10-CM | POA: Diagnosis not present

## 2023-10-03 ENCOUNTER — Other Ambulatory Visit: Payer: MEDICARE

## 2023-10-03 DIAGNOSIS — I495 Sick sinus syndrome: Secondary | ICD-10-CM | POA: Diagnosis not present

## 2023-10-03 DIAGNOSIS — Z01812 Encounter for preprocedural laboratory examination: Secondary | ICD-10-CM | POA: Diagnosis not present

## 2023-10-03 NOTE — Addendum Note (Signed)
 Addended by: [name] on: 10/03/2023 12:34 PM   Modules accepted: Orders, Level of Service

## 2023-10-04 ENCOUNTER — Telehealth: Payer: Self-pay | Admitting: Family Medicine

## 2023-10-04 DIAGNOSIS — I480 Paroxysmal atrial fibrillation: Secondary | ICD-10-CM | POA: Diagnosis not present

## 2023-10-04 DIAGNOSIS — L03115 Cellulitis of right lower limb: Secondary | ICD-10-CM | POA: Diagnosis not present

## 2023-10-04 DIAGNOSIS — E039 Hypothyroidism, unspecified: Secondary | ICD-10-CM | POA: Diagnosis not present

## 2023-10-04 DIAGNOSIS — S91301D Unspecified open wound, right foot, subsequent encounter: Secondary | ICD-10-CM | POA: Diagnosis not present

## 2023-10-04 DIAGNOSIS — I10 Essential (primary) hypertension: Secondary | ICD-10-CM | POA: Diagnosis not present

## 2023-10-04 DIAGNOSIS — I495 Sick sinus syndrome: Secondary | ICD-10-CM | POA: Diagnosis not present

## 2023-10-04 LAB — CBC
Hematocrit: 34.4 % (ref 34.0–46.6)
Hemoglobin: 10.9 g/dL — ABNORMAL LOW (ref 11.1–15.9)
MCH: 28.5 pg (ref 26.6–33.0)
MCHC: 31.7 g/dL (ref 31.5–35.7)
MCV: 90 fL (ref 79–97)
Platelets: 237 10*3/uL (ref 150–450)
RBC: 3.83 x10E6/uL (ref 3.77–5.28)
RDW: 14 % (ref 11.7–15.4)
WBC: 4.9 10*3/uL (ref 3.4–10.8)

## 2023-10-04 LAB — BASIC METABOLIC PANEL WITH GFR
BUN/Creatinine Ratio: 16 (ref 12–28)
BUN: 18 mg/dL (ref 10–36)
CO2: 25 mmol/L (ref 20–29)
Calcium: 9.6 mg/dL (ref 8.7–10.3)
Chloride: 100 mmol/L (ref 96–106)
Creatinine, Ser: 1.13 mg/dL — ABNORMAL HIGH (ref 0.57–1.00)
Glucose: 101 mg/dL — ABNORMAL HIGH (ref 70–99)
Potassium: 4.6 mmol/L (ref 3.5–5.2)
Sodium: 140 mmol/L (ref 134–144)
eGFR: 46 mL/min/{1.73_m2} — ABNORMAL LOW (ref >59)

## 2023-10-04 NOTE — Addendum Note (Signed)
 Addended by: Geralyn Flash D on: 10/04/2023 10:15 AM   Modules accepted: Orders

## 2023-10-04 NOTE — Progress Notes (Signed)
 Remote pacemaker transmission.

## 2023-10-04 NOTE — Telephone Encounter (Signed)
 Copied from CRM [id number]. Topic: Clinical - Home Health Verbal Orders >> Oct 04, 2023 12:21 PM [place] B wrote: Caller/Agency:Tabitha from [facility] Balinda Quails Callback Number: [phone]  Service Requested: treat wound on right breast Frequency: ? Any new concerns about the patient? no

## 2023-10-04 NOTE — Telephone Encounter (Signed)
 TC back to Tabitha w/ [facility] VO given to treat wound on R breast w/ calogen and see how that goes for now.

## 2023-10-05 ENCOUNTER — Telehealth: Payer: Self-pay | Admitting: Cardiovascular Disease

## 2023-10-05 NOTE — Telephone Encounter (Signed)
 Have asked her to stop warfarin for now. She was supposed to have INR drawn with the preop labs, but not sent it appears. [name] has already reached out to the patient.

## 2023-10-05 NOTE — Telephone Encounter (Signed)
 Per son she is having procedure tomorrow and he is wanting to talk to someone about if she is to hold any medication and any instructions

## 2023-10-05 NOTE — Telephone Encounter (Signed)
 Patient identification verified by 2 forms. Marilynn Rail, RN    Called and spoke to patient's son Weston Brass provider message below Jonny Ruiz verbalized understanding, no questions at this time

## 2023-10-05 NOTE — Pre-Procedure Instructions (Signed)
 Spoke with son [name].  Instructed on the following items: Arrival time 1330 Nothing to eat or drink after midnight No meds AM of procedure Responsible person to drive you home and stay with you for 24 hrs Wash with special soap night before and morning of procedure If on anti-coagulant drug instructions Coumdin- do not take dose tonight

## 2023-10-06 ENCOUNTER — Other Ambulatory Visit: Payer: Self-pay

## 2023-10-06 ENCOUNTER — Ambulatory Visit (HOSPITAL_COMMUNITY)
Admission: RE | Admit: 2023-10-06 | Discharge: 2023-10-06 | Disposition: A | Discharge status: Home / Self Care | Payer: MEDICARE | Attending: Cardiovascular Disease | Admitting: Cardiovascular Disease

## 2023-10-06 ENCOUNTER — Encounter (HOSPITAL_COMMUNITY)
Admission: RE | Disposition: A | Discharge status: Home / Self Care | Payer: Self-pay | Source: Home / Self Care | Attending: Cardiovascular Disease

## 2023-10-06 DIAGNOSIS — I495 Sick sinus syndrome: Secondary | ICD-10-CM | POA: Diagnosis not present

## 2023-10-06 DIAGNOSIS — I872 Venous insufficiency (chronic) (peripheral): Secondary | ICD-10-CM | POA: Diagnosis not present

## 2023-10-06 DIAGNOSIS — I10 Essential (primary) hypertension: Secondary | ICD-10-CM | POA: Diagnosis not present

## 2023-10-06 DIAGNOSIS — I480 Paroxysmal atrial fibrillation: Secondary | ICD-10-CM | POA: Insufficient documentation

## 2023-10-06 DIAGNOSIS — N6315 Unspecified lump in the right breast, overlapping quadrants: Secondary | ICD-10-CM | POA: Insufficient documentation

## 2023-10-06 DIAGNOSIS — D6859 Other primary thrombophilia: Secondary | ICD-10-CM | POA: Insufficient documentation

## 2023-10-06 DIAGNOSIS — Z79899 Other long term (current) drug therapy: Secondary | ICD-10-CM | POA: Diagnosis not present

## 2023-10-06 DIAGNOSIS — Z7901 Long term (current) use of anticoagulants: Secondary | ICD-10-CM | POA: Insufficient documentation

## 2023-10-06 DIAGNOSIS — Z45010 Encounter for checking and testing of cardiac pacemaker pulse generator [battery]: Secondary | ICD-10-CM | POA: Insufficient documentation

## 2023-10-06 HISTORY — PX: PPM GENERATOR CHANGEOUT: EP1233

## 2023-10-06 LAB — PROTIME-INR
INR: 1.9 — ABNORMAL HIGH (ref 0.8–1.2)
Prothrombin Time: 21.7 s — ABNORMAL HIGH (ref 11.4–15.2)

## 2023-10-06 SURGERY — PPM GENERATOR CHANGEOUT

## 2023-10-06 MED ORDER — SODIUM CHLORIDE 0.9% FLUSH: 3.0000 mL | Freq: Two times a day (BID) | INTRAVENOUS | Status: DC

## 2023-10-06 MED ORDER — POVIDONE-IODINE 10 % EX SWAB
2.0000 | Freq: Once | CUTANEOUS | Status: AC
  Administered 2023-10-06: 2 via TOPICAL

## 2023-10-06 MED ORDER — WARFARIN SODIUM 2.5 MG PO TABS: ORAL_TABLET | ORAL | 3 refills | Status: DC

## 2023-10-06 MED ORDER — CEFAZOLIN SODIUM-DEXTROSE 2-4 GM/100ML-% IV SOLN
INTRAVENOUS | Status: AC
  Filled 2023-10-06: qty 100

## 2023-10-06 MED ORDER — CEFAZOLIN SODIUM-DEXTROSE 2-4 GM/100ML-% IV SOLN
2.0000 g | INTRAVENOUS | Status: AC
  Administered 2023-10-06: 2 g via INTRAVENOUS

## 2023-10-06 MED ORDER — ACETAMINOPHEN 325 MG PO TABS: 325.0000 mg | ORAL_TABLET | ORAL | Status: DC | PRN

## 2023-10-06 MED ORDER — SODIUM CHLORIDE 0.9 % IV SOLN: INTRAVENOUS | Status: DC | PRN

## 2023-10-06 MED ORDER — SODIUM CHLORIDE 0.9 % IV SOLN
80.0000 mg | INTRAVENOUS | Status: AC
  Administered 2023-10-06: 80 mg

## 2023-10-06 MED ORDER — SODIUM CHLORIDE 0.9 % IV SOLN
INTRAVENOUS | Status: AC
  Filled 2023-10-06: qty 2

## 2023-10-06 MED ORDER — LIDOCAINE HCL (PF) 1 % IJ SOLN
INTRAMUSCULAR | Status: DC | PRN
Start: 2023-10-06 — End: 2023-10-06
  Administered 2023-10-06: 60 mL via INTRADERMAL

## 2023-10-06 MED ORDER — CHLORHEXIDINE GLUCONATE 4 % EX SOLN
4.0000 | Freq: Once | CUTANEOUS | Status: DC
  Filled 2023-10-06: qty 60

## 2023-10-06 MED ORDER — ONDANSETRON HCL 4 MG/2ML IJ SOLN: 4.0000 mg | Freq: Four times a day (QID) | INTRAMUSCULAR | Status: DC | PRN

## 2023-10-06 MED ORDER — SODIUM CHLORIDE 0.9% FLUSH: 3.0000 mL | INTRAVENOUS | Status: DC | PRN

## 2023-10-06 MED ORDER — LIDOCAINE HCL (PF) 1 % IJ SOLN
INTRAMUSCULAR | Status: AC
  Filled 2023-10-06: qty 60

## 2023-10-06 SURGICAL SUPPLY — 7 items
CABLE SURGICAL S-101-97-12 (CABLE) ×1 IMPLANT
IPG PACE AZUR XT DR MRI W1DR01 (Pacemaker) IMPLANT
PACE AZURE XT DR MRI W1DR01 (Pacemaker) ×1 IMPLANT
PAD DEFIB RADIO PHYSIO CONN (PAD) ×1 IMPLANT
POUCH AIGIS-R ANTIBACT PPM (Mesh General) ×1 IMPLANT
POUCH AIGIS-R ANTIBACT PPM MED (Mesh General) IMPLANT
TRAY PACEMAKER INSERTION (PACKS) ×1 IMPLANT

## 2023-10-06 NOTE — Interval H&P Note (Signed)
 History and Physical Interval Note:  10/06/2023 3:09 PM  [name]  has presented today for surgery, with the diagnosis of afib.  The various methods of treatment have been discussed with the patient and family. After consideration of risks, benefits and other options for treatment, the patient has consented to  Procedure(s): PPM GENERATOR CHANGEOUT (N/A) as a surgical intervention.  The patient's history has been reviewed, patient examined, no change in status, stable for surgery.  I have reviewed the patient's chart and labs.  Questions were answered to the patient's satisfaction.     Delaine Hernandez

## 2023-10-06 NOTE — Discharge Instructions (Addendum)
 Implantable Cardiac Device Battery Change, Care After  This sheet gives you information about how to care for yourself after your procedure. Your health care provider may also give you more specific instructions. If you have problems or questions, contact your health care provider. What can I expect after the procedure? After your procedure, it is common to have: Pain or soreness at the site where the cardiac device was inserted. Swelling at the site where the cardiac device was inserted. You should received an information card for your new device in 4-8 weeks. Follow these instructions at home: Incision care  Keep the incision clean and dry. Do not take baths, swim, or use a hot tub until after your wound check.  Do not shower for at least 7 days, or as directed by your health care provider. Pat the area dry with a clean towel. Do not rub the area. This may cause bleeding. Follow instructions from your health care provider about how to take care of your incision. Make sure you: If your incision is sealed with Steri-strips or staples, you may shower 7 days after your procedure or when told by your provider. Do not remove the steri-strips or let the shower hit directly on your site. You may wash around your site with soap and water. Steri strips will stay on for 10-14 days. You can take the square bandage off on Saturday 4/12 Check your incision area every day for signs of infection. Check for: More redness, swelling, or pain. More fluid or blood. Warmth. Pus or a bad smell. Activity Do not lift anything that is heavier than 10 lb (4.5 kg) for 7 days. For the first week, or as long as told by your health care provider: Avoid lifting your affected arm higher than your shoulder. After 1 week, Be gentle when you move your arms over your head. It is okay to raise your arm to comb your hair. Avoid strenuous exercise. Ask your health care provider when it is okay to: Resume your normal  activities. Return to work or school. Resume sexual activity. Eating and drinking Eat a heart-healthy diet. This should include plenty of fresh fruits and vegetables, whole grains, low-fat dairy products, and lean protein like chicken and fish. Limit alcohol intake to no more than 1 drink a day for non-pregnant women and 2 drinks a day for men. One drink equals 12 oz of beer, 5 oz of wine, or 1 oz of hard liquor. Check ingredients and nutrition facts on packaged foods and beverages. Avoid the following types of food: Food that is high in salt (sodium). Food that is high in saturated fat, like full-fat dairy or red meat. Food that is high in trans fat, like fried food. Food and drinks that are high in sugar. Lifestyle Do not use any products that contain nicotine or tobacco, such as cigarettes and e-cigarettes. If you need help quitting, ask your health care provider. Take steps to manage and control your weight. Once cleared, get regular exercise. Aim for 150 minutes of moderate-intensity exercise (such as walking or yoga) or 75 minutes of vigorous exercise (such as running or swimming) each week. Manage other health problems, such as diabetes or high blood pressure. Ask your health care provider how you can manage these conditions. General instructions Do not drive for 24 hours after your procedure if you were given a medicine to help you relax (sedative). Take over-the-counter and prescription medicines only as told by your health care provider. Avoid putting pressure  on the area where the cardiac device was placed. If you need an MRI after your cardiac device has been placed, be sure to tell the health care provider who orders the MRI that you have a cardiac device. Avoid close and prolonged exposure to electrical devices that have strong magnetic fields. These include: Cell phones. Avoid keeping them in a pocket near the cardiac device, and try using the ear opposite the cardiac  device. MP3 players. Household appliances, like microwaves. Metal detectors. Electric generators. High-tension wires. Keep all follow-up visits as directed by your health care provider. This is important. Contact a health care provider if: You have pain at the incision site that is not relieved by over-the-counter or prescription medicines. You have any of these around your incision site or coming from it: More redness, swelling, or pain. Fluid or blood. Warmth to the touch. Pus or a bad smell. You have a fever. You feel brief, occasional palpitations, light-headedness, or any symptoms that you think might be related to your heart. Get help right away if: You experience chest pain that is different from the pain at the cardiac device site. You develop a red streak that extends above or below the incision site. You experience shortness of breath. You have palpitations or an irregular heartbeat. You have light-headedness that does not go away quickly. You faint or have dizzy spells. Your pulse suddenly drops or increases rapidly and does not return to normal. You begin to gain weight and your legs and ankles swell. Summary After your procedure, it is common to have pain, soreness, and some swelling where the cardiac device was inserted. Make sure to keep your incision clean and dry. Follow instructions from your health care provider about how to take care of your incision. Check your incision every day for signs of infection, such as more pain or swelling, pus or a bad smell, warmth, or leaking fluid and blood. Avoid strenuous exercise and lifting your left arm higher than your shoulder for 2 weeks, or as long as told by your health care provider. This information is not intended to replace advice given to you by your health care provider. Make sure you discuss any questions you have with your health care provider.

## 2023-10-06 NOTE — Op Note (Signed)
 Procedure report  Procedure performed:  Dual chamber pacemaker generator changeout   Reason for procedure:  1. Device generator at elective replacement interval  2. Tachycardia-Bradycardia syndrome Procedure performed by:  Thurmon Fair, MD  Complications:  None  Estimated blood loss:  <5 mL  Medications administered during procedure:  Ancef 2 g intravenously, lidocaine 1% 30 mL locally  Device details:   New Generator Medtronic Azure XT DR model number M5895571, serial number [id number] Right atrial lead (chronic) Medtronic , model number [id number], serial number [id number] (implanted 05/24/2007) Right ventricular lead (chronic)  Med[employer]inic, model number [id number], serial number [id number] (implanted 05/24/2007)  Explanted generator Medtronic Azure XT DR, serial number  [id number] (implanted 08/25/2016)  Procedure details:  After the risks and benefits of the procedure were discussed the patient provided informed consent. She was brought to the cardiac catheter lab in the fasting state. The patient was prepped and draped in usual sterile fashion. Local anesthesia with 1% lidocaine was administered to to the left infraclavicular area. A 5-6cm horizontal incision was made parallel with and 2-3 cm caudal to the left clavicle, in the area of an old scar. . Using minimal electrocautery and mostly sharp and blunt dissection the prepectoral pocket was opened carefully to avoid injury to the loops of chronic leads. Extensive dissection was not necessary. The device was explanted. The pocket was carefully inspected for hemostasis and flushed with copious amounts of antibiotic solution. The skin and subcutaneous tissues were very thin and fragile.  The leads were disconnected from the old generator and testing of the lead parameters vias telemetry showed stable values. Although the atrial lead had poor sensing parameters and both leads have relatively high capture threshold, the decision was made to  preserve the old lead. Her advanced age and serious comorbid condition make her a poor candidate for lead revision.The new generator was connected to the chronic leads, with appropriate pacing noted.   The entire system was then carefully inserted in the pocket with care been taking that the leads and device assumed a comfortable position without pressure on the incision. Great care was taken that the leads be located deep to the generator. The pocket was then closed in layers using 2 layers of 2-0 Vicryl, one layer of 3-0 Vicryl and cutaneous steristrips after which a sterile dressing was applied.   At the end of the procedure the following lead parameters were encountered:   Right atrial lead sensed P waves 0.3 mV, impedance 399 ohms, threshold 1.5 at 1.0 ms pulse width.  Right ventricular lead sensed R waves  7.8 mV, impedance 380 ohms, threshold 1.0 at 2.0 ms pulse width.  Thurmon Fair, MD, [facility] [phone] office [phone] pager

## 2023-10-07 ENCOUNTER — Encounter (HOSPITAL_COMMUNITY): Payer: Self-pay | Admitting: Cardiovascular Disease

## 2023-10-08 DIAGNOSIS — I480 Paroxysmal atrial fibrillation: Secondary | ICD-10-CM | POA: Diagnosis not present

## 2023-10-08 DIAGNOSIS — I495 Sick sinus syndrome: Secondary | ICD-10-CM | POA: Diagnosis not present

## 2023-10-08 DIAGNOSIS — L03115 Cellulitis of right lower limb: Secondary | ICD-10-CM | POA: Diagnosis not present

## 2023-10-08 DIAGNOSIS — I10 Essential (primary) hypertension: Secondary | ICD-10-CM | POA: Diagnosis not present

## 2023-10-08 DIAGNOSIS — E039 Hypothyroidism, unspecified: Secondary | ICD-10-CM | POA: Diagnosis not present

## 2023-10-08 DIAGNOSIS — S91301D Unspecified open wound, right foot, subsequent encounter: Secondary | ICD-10-CM | POA: Diagnosis not present

## 2023-10-11 DIAGNOSIS — L03115 Cellulitis of right lower limb: Secondary | ICD-10-CM | POA: Diagnosis not present

## 2023-10-11 DIAGNOSIS — S91301D Unspecified open wound, right foot, subsequent encounter: Secondary | ICD-10-CM | POA: Diagnosis not present

## 2023-10-11 DIAGNOSIS — I480 Paroxysmal atrial fibrillation: Secondary | ICD-10-CM | POA: Diagnosis not present

## 2023-10-11 DIAGNOSIS — I10 Essential (primary) hypertension: Secondary | ICD-10-CM | POA: Diagnosis not present

## 2023-10-11 DIAGNOSIS — I495 Sick sinus syndrome: Secondary | ICD-10-CM | POA: Diagnosis not present

## 2023-10-11 DIAGNOSIS — E039 Hypothyroidism, unspecified: Secondary | ICD-10-CM | POA: Diagnosis not present

## 2023-10-13 DIAGNOSIS — I10 Essential (primary) hypertension: Secondary | ICD-10-CM | POA: Diagnosis not present

## 2023-10-13 DIAGNOSIS — I495 Sick sinus syndrome: Secondary | ICD-10-CM | POA: Diagnosis not present

## 2023-10-13 DIAGNOSIS — I480 Paroxysmal atrial fibrillation: Secondary | ICD-10-CM | POA: Diagnosis not present

## 2023-10-13 DIAGNOSIS — S91301D Unspecified open wound, right foot, subsequent encounter: Secondary | ICD-10-CM | POA: Diagnosis not present

## 2023-10-13 DIAGNOSIS — L03115 Cellulitis of right lower limb: Secondary | ICD-10-CM | POA: Diagnosis not present

## 2023-10-13 DIAGNOSIS — E039 Hypothyroidism, unspecified: Secondary | ICD-10-CM | POA: Diagnosis not present

## 2023-10-14 DIAGNOSIS — E039 Hypothyroidism, unspecified: Secondary | ICD-10-CM | POA: Diagnosis not present

## 2023-10-14 DIAGNOSIS — S21001D Unspecified open wound of right breast, subsequent encounter: Secondary | ICD-10-CM | POA: Diagnosis not present

## 2023-10-14 DIAGNOSIS — I480 Paroxysmal atrial fibrillation: Secondary | ICD-10-CM | POA: Diagnosis not present

## 2023-10-14 DIAGNOSIS — N63 Unspecified lump in unspecified breast: Secondary | ICD-10-CM | POA: Diagnosis not present

## 2023-10-14 DIAGNOSIS — Z7901 Long term (current) use of anticoagulants: Secondary | ICD-10-CM | POA: Diagnosis not present

## 2023-10-14 DIAGNOSIS — I10 Essential (primary) hypertension: Secondary | ICD-10-CM | POA: Diagnosis not present

## 2023-10-14 DIAGNOSIS — Z604 Social exclusion and rejection: Secondary | ICD-10-CM | POA: Diagnosis not present

## 2023-10-14 DIAGNOSIS — Z556 Problems related to health literacy: Secondary | ICD-10-CM | POA: Diagnosis not present

## 2023-10-14 DIAGNOSIS — I495 Sick sinus syndrome: Secondary | ICD-10-CM | POA: Diagnosis not present

## 2023-10-20 ENCOUNTER — Ambulatory Visit: Payer: MEDICARE | Attending: Cardiovascular Disease

## 2023-10-20 DIAGNOSIS — I495 Sick sinus syndrome: Secondary | ICD-10-CM | POA: Insufficient documentation

## 2023-10-20 LAB — CUP PACEART INCLINIC DEVICE CHECK
Date Time Interrogation Session: 20250424142751
Implantable Lead Connection Status: 753985
Implantable Lead Connection Status: 753985
Implantable Lead Implant Date: 20081126
Implantable Lead Implant Date: 20081126
Implantable Lead Location: 753859
Implantable Lead Location: 753860
Implantable Lead Model: 5076
Implantable Lead Model: 5076
Implantable Pulse Generator Implant Date: 20250410

## 2023-10-20 NOTE — Patient Instructions (Signed)

## 2023-10-20 NOTE — Progress Notes (Signed)
 Normal in-clinic dual chamber pacemaker check. Presenting Rhythm: AS/VS- 58 w/ occasional atrial pacing . Routine testing of thresholds, sensing, and impedance demonstrate stable parameters and no programming changes needed at this time. No episodes. Estimated longevity 12.5 years . Pt enrolled in remote follow-up.  Dr. Alvis Ba aware of known elevated RA & RV thresholds.

## 2023-10-21 DIAGNOSIS — S21001D Unspecified open wound of right breast, subsequent encounter: Secondary | ICD-10-CM | POA: Diagnosis not present

## 2023-10-21 DIAGNOSIS — I495 Sick sinus syndrome: Secondary | ICD-10-CM | POA: Diagnosis not present

## 2023-10-21 DIAGNOSIS — I10 Essential (primary) hypertension: Secondary | ICD-10-CM | POA: Diagnosis not present

## 2023-10-21 DIAGNOSIS — N63 Unspecified lump in unspecified breast: Secondary | ICD-10-CM | POA: Diagnosis not present

## 2023-10-21 DIAGNOSIS — I480 Paroxysmal atrial fibrillation: Secondary | ICD-10-CM | POA: Diagnosis not present

## 2023-10-21 DIAGNOSIS — E039 Hypothyroidism, unspecified: Secondary | ICD-10-CM | POA: Diagnosis not present

## 2023-10-26 ENCOUNTER — Telehealth: Payer: Self-pay

## 2023-10-26 NOTE — Telephone Encounter (Addendum)
 I saw patient in device clinic last week. She got a brand new Azure Medtronic PPM implanted on 10/06/50 (gen change).  She has the old [id number] model remote monitor with the handheld that she used with her old device and has not transmitted since her gen change with the old monitor.   Can we get her a Relay for easier use? Patient is requesting.

## 2023-10-26 NOTE — Telephone Encounter (Signed)
 Monitored ordered. She should receive it in 7-10 business days. A return kit was ordered as well for the old monitor.

## 2023-10-27 DIAGNOSIS — I495 Sick sinus syndrome: Secondary | ICD-10-CM | POA: Diagnosis not present

## 2023-10-27 DIAGNOSIS — S21001D Unspecified open wound of right breast, subsequent encounter: Secondary | ICD-10-CM | POA: Diagnosis not present

## 2023-10-27 DIAGNOSIS — I480 Paroxysmal atrial fibrillation: Secondary | ICD-10-CM | POA: Diagnosis not present

## 2023-10-27 DIAGNOSIS — E039 Hypothyroidism, unspecified: Secondary | ICD-10-CM | POA: Diagnosis not present

## 2023-10-27 DIAGNOSIS — N63 Unspecified lump in unspecified breast: Secondary | ICD-10-CM | POA: Diagnosis not present

## 2023-10-27 DIAGNOSIS — I10 Essential (primary) hypertension: Secondary | ICD-10-CM | POA: Diagnosis not present

## 2023-11-02 DIAGNOSIS — N63 Unspecified lump in unspecified breast: Secondary | ICD-10-CM | POA: Diagnosis not present

## 2023-11-02 DIAGNOSIS — E039 Hypothyroidism, unspecified: Secondary | ICD-10-CM | POA: Diagnosis not present

## 2023-11-02 DIAGNOSIS — S21001D Unspecified open wound of right breast, subsequent encounter: Secondary | ICD-10-CM | POA: Diagnosis not present

## 2023-11-02 DIAGNOSIS — I480 Paroxysmal atrial fibrillation: Secondary | ICD-10-CM | POA: Diagnosis not present

## 2023-11-02 DIAGNOSIS — I10 Essential (primary) hypertension: Secondary | ICD-10-CM | POA: Diagnosis not present

## 2023-11-02 DIAGNOSIS — I495 Sick sinus syndrome: Secondary | ICD-10-CM | POA: Diagnosis not present

## 2023-11-07 ENCOUNTER — Telehealth: Payer: Self-pay

## 2023-11-07 NOTE — Telephone Encounter (Signed)
 Patient needs apt with Dr. Alvis Ba per his request.

## 2023-11-07 NOTE — Telephone Encounter (Signed)
 Could you please see if she can come and see me on Wednesday at 10 AM?

## 2023-11-07 NOTE — Telephone Encounter (Signed)
 Alert received from [employer] for Presenting EGM shows undersensing of multiple p waves.  P wave measures at 0.45mV with atrial sensitivity @ 0.58mV. AP x 40%.  Atrial output is set @ 2.5V/1.64ms.  Imp trends stable.    Routing to Dr. Alvis Ba to advise if patient should be seen in device clinic to check RA lead/make programming changes?  RA sensitivity was 0.21ms and RA sensitivity @ 0.72mv. RA paces 40%.

## 2023-11-09 ENCOUNTER — Encounter: Payer: Self-pay | Admitting: Cardiovascular Disease

## 2023-11-09 ENCOUNTER — Ambulatory Visit: Payer: MEDICARE | Attending: Cardiovascular Disease | Admitting: Cardiovascular Disease

## 2023-11-09 VITALS — BP 130/62 | HR 60 | Ht 68.0 in | Wt 145.8 lb

## 2023-11-09 DIAGNOSIS — T82110D Breakdown (mechanical) of cardiac electrode, subsequent encounter: Secondary | ICD-10-CM

## 2023-11-09 NOTE — Progress Notes (Signed)
 Recent pacemaker download showed problems with atrial undersensing. Bipolar sensing is very poor with P waves of 0.1 mV (already programmed at lower limit of sensitivity 0.15 mV). Unipolar sensing is slightly better with P waves of 0.1-0.3 mV, but still not really acceptable. She is not pacemaker dependent and has mostly atrial sensed-ventricular sensed (sinus rhythm), occasionally with ventricular pacing for Wenckebach cycles.  Ventricular lead parameters are good with sensed R waves around 10 mV and capture threshold 0.75 V at 0.4 ms. Device programmed DDIR with a lower rate limit of 45 bpm.  Will reevaluate options at her July appointment.

## 2023-11-09 NOTE — Patient Instructions (Addendum)
 Medication Instructions:  No changes *If you need a refill on your cardiac medications before your next appointment, please call your pharmacy*  Follow-Up: At [facility], you and your health needs are our priority.  As part of our continuing mission to provide you with exceptional heart care, our providers are all part of one team.  This team includes your primary Cardiologist (physician) and Advanced Practice Providers or APPs (Physician Assistants and Nurse Practitioners) who all work together to provide you with the care you need, when you need it.  Your next appointment:   Already scheduled appointment Thursday, 01/19/24 08:20   Provider:   Dr Alvis Ba  We recommend signing up for the patient portal called "MyChart".  Sign up information is provided on this After Visit Summary.  MyChart is used to connect with patients for Virtual Visits (Telemedicine).  Patients are able to view lab/test results, encounter notes, upcoming appointments, etc.  Non-urgent messages can be sent to your provider as well.   To learn more about what you can do with MyChart, go to [email or web address].

## 2023-11-12 DIAGNOSIS — E039 Hypothyroidism, unspecified: Secondary | ICD-10-CM | POA: Diagnosis not present

## 2023-11-12 DIAGNOSIS — S21001D Unspecified open wound of right breast, subsequent encounter: Secondary | ICD-10-CM | POA: Diagnosis not present

## 2023-11-12 DIAGNOSIS — I495 Sick sinus syndrome: Secondary | ICD-10-CM | POA: Diagnosis not present

## 2023-11-12 DIAGNOSIS — I10 Essential (primary) hypertension: Secondary | ICD-10-CM | POA: Diagnosis not present

## 2023-11-12 DIAGNOSIS — I480 Paroxysmal atrial fibrillation: Secondary | ICD-10-CM | POA: Diagnosis not present

## 2023-11-12 DIAGNOSIS — N63 Unspecified lump in unspecified breast: Secondary | ICD-10-CM | POA: Diagnosis not present

## 2023-11-13 DIAGNOSIS — I495 Sick sinus syndrome: Secondary | ICD-10-CM | POA: Diagnosis not present

## 2023-11-13 DIAGNOSIS — S21001D Unspecified open wound of right breast, subsequent encounter: Secondary | ICD-10-CM | POA: Diagnosis not present

## 2023-11-13 DIAGNOSIS — I10 Essential (primary) hypertension: Secondary | ICD-10-CM | POA: Diagnosis not present

## 2023-11-13 DIAGNOSIS — Z604 Social exclusion and rejection: Secondary | ICD-10-CM | POA: Diagnosis not present

## 2023-11-13 DIAGNOSIS — I480 Paroxysmal atrial fibrillation: Secondary | ICD-10-CM | POA: Diagnosis not present

## 2023-11-13 DIAGNOSIS — N63 Unspecified lump in unspecified breast: Secondary | ICD-10-CM | POA: Diagnosis not present

## 2023-11-13 DIAGNOSIS — E039 Hypothyroidism, unspecified: Secondary | ICD-10-CM | POA: Diagnosis not present

## 2023-11-13 DIAGNOSIS — Z7901 Long term (current) use of anticoagulants: Secondary | ICD-10-CM | POA: Diagnosis not present

## 2023-11-13 DIAGNOSIS — Z556 Problems related to health literacy: Secondary | ICD-10-CM | POA: Diagnosis not present

## 2023-11-16 ENCOUNTER — Encounter: Payer: Self-pay | Admitting: Family Medicine

## 2023-11-16 ENCOUNTER — Ambulatory Visit (INDEPENDENT_AMBULATORY_CARE_PROVIDER_SITE_OTHER): Payer: MEDICARE | Admitting: Family Medicine

## 2023-11-16 VITALS — BP 140/84 | HR 65 | Temp 98.5°F | Ht 68.0 in

## 2023-11-16 DIAGNOSIS — F418 Other specified anxiety disorders: Secondary | ICD-10-CM

## 2023-11-16 DIAGNOSIS — I480 Paroxysmal atrial fibrillation: Secondary | ICD-10-CM | POA: Diagnosis not present

## 2023-11-16 DIAGNOSIS — Z7901 Long term (current) use of anticoagulants: Secondary | ICD-10-CM

## 2023-11-16 DIAGNOSIS — N6314 Unspecified lump in the right breast, lower inner quadrant: Secondary | ICD-10-CM

## 2023-11-16 LAB — COAGUCHEK XS/INR WAIVED
INR: 2.2 — ABNORMAL HIGH (ref 0.9–1.1)
Prothrombin Time: 26.8 s

## 2023-11-16 MED ORDER — METOPROLOL TARTRATE 50 MG PO TABS: ORAL_TABLET | ORAL | 4 refills | Status: AC

## 2023-11-16 MED ORDER — BUSPIRONE HCL 5 MG PO TABS
5.0000 mg | ORAL_TABLET | Freq: Two times a day (BID) | ORAL | 3 refills | Status: AC
Start: 2023-11-16 — End: ?

## 2023-11-16 NOTE — Progress Notes (Signed)
 Subjective: JY:NWGN PCP: Eliodoro Guerin, DO FAO:ZHYQM [name] is a 89 y.o. female presenting to clinic today for:  1. AFib INR 2-3. Compliant with Coumadin .  No missed doses.  No reports of bleeding.  She reports to me that she has follow-up for the breast lesions.  She just had her pacemaker changed out and is doing well from that standpoint.  2.  Nerves She has been having some increased anxiety surrounding the health of her 2 twin boys.  1 suffers from advanced Parkinson's disease and the other 1 from diabetes.  They have been having mobility issues and she worries about their health.  She notes today is also her deceased husband's birthday   ROS: Per HPI  Allergies  Allergen Reactions   Statins Other (See Comments)    Myalgia   Past Medical History:  Diagnosis Date   A-fib (HCC)    Dyslipidemia    Hyperlipidemia    Hypothyroidism    2 degree amidarone    Pacemaker    PAF (paroxysmal atrial fibrillation) (HCC)    S/P mitral valve repair    SSS (sick sinus syndrome) (HCC)    Systemic hypertension     Current Outpatient Medications:    Ferrous Sulfate (IRON) 28 MG TABS, Take 28 mg by mouth daily. (Patient not taking: Reported on 11/09/2023), Disp: , Rfl:    levothyroxine  (SYNTHROID ) 25 MCG tablet, Take 1 tablet (25 mcg total) by mouth daily., Disp: 90 tablet, Rfl: 3   metoprolol  tartrate (LOPRESSOR ) 50 MG tablet, TAKE 1 TABLET TWICE A DAY (KEEP UPCOMING APPOINTMENT FOR FUTURE REFILLS), Disp: 60 tablet, Rfl: 11   Polyethyl Glycol-Propyl Glycol (SYSTANE OP), Place 1 drop into both eyes as needed (dry eyes). (Patient not taking: Reported on 11/09/2023), Disp: , Rfl:    silver  sulfADIAZINE  (SSD) 1 % cream, Apply 1 Application topically daily. To affected areas on foot until healed (Patient not taking: Reported on 11/09/2023), Disp: 50 g, Rfl: 0   triamterene -hydrochlorothiazide (DYAZIDE) 37.5-25 MG capsule, TAKE 1 CAPSULE DAILY (PLEASE KEEP UPCOMING APPOINTMENT FOR FUTURE  REFILLS, THANK YOU), Disp: 30 capsule, Rfl: 11   warfarin (COUMADIN ) 2.5 MG tablet, TAKE 1 TABLET DAILY EXCEPT TAKE 2 TABLETS ON FRIDAYS, Disp: 90 tablet, Rfl: 3 Social History   Socioeconomic History   Marital status: Widowed    Spouse name: Not on file   Number of children: 3   Years of education: Not on file   Highest education level: High school graduate  Occupational History   Occupation: Retired  Tobacco Use   Smoking status: Never   Smokeless tobacco: Never  Vaping Use   Vaping status: Never Used  Substance and Sexual Activity   Alcohol  use: No   Drug use: Never   Sexual activity: Not Currently  Other Topics Concern   Not on file  Social History Narrative   Husband passed 06/29/2021   2 sons live with her   Social Drivers of Corporate investment banker Strain: Low Risk  (09/19/2023)   Overall Financial Resource Strain (CARDIA)    Difficulty of Paying Living Expenses: Not hard at all  Food Insecurity: No Food Insecurity (09/19/2023)   Hunger Vital Sign    Worried About Running Out of Food in the Last Year: Never true    Ran Out of Food in the Last Year: Never true  Transportation Needs: No Transportation Needs (09/19/2023)   PRAPARE - Administrator, Civil Service (Medical): No  Lack of Transportation (Non-Medical): No  Physical Activity: Insufficiently Active (09/19/2023)   Exercise Vital Sign    Days of Exercise per Week: 3 days    Minutes of Exercise per Session: 30 min  Stress: No Stress Concern Present (09/19/2023)   [name]    Feeling of Stress : Not at all  Social Connections: Moderately Isolated (09/19/2023)   Social Connection and Isolation Panel [NHANES]    Frequency of Communication with Friends and Family: More than three times a week    Frequency of Social Gatherings with Friends and Family: More than three times a week    Attends Religious Services: More than 4 times per  year    Active Member of [employer] or Organizations: No    Attends Banker Meetings: Never    Marital Status: Widowed  Intimate Partner Violence: Not At Risk (09/19/2023)   Humiliation, Afraid, Rape, and Kick questionnaire    Fear of Current or Ex-Partner: No    Emotionally Abused: No    Physically Abused: No    Sexually Abused: No   Family History  Problem Relation Age of Onset   Heart attack Mother    Heart attack Father    Parkinson's disease Son     Objective: Office vital signs reviewed. BP (!) 140/84   Pulse 65   Temp 98.5 F (36.9 C)   Ht 5\' 8"  (1.727 m)   SpO2 99%   BMI 22.17 kg/m   Physical Examination:  General: Awake, alert, well nourished, No acute distress HEENT: Sclera white.  Moist mucous membranes Cardio: regular rate and rhythm, S1S2 heard, no murmurs appreciated Pulm: clear to auscultation bilaterally, no wheezes, rhonchi or rales; normal work of breathing on room air MSK: Arrives in wheelchair.  Lymphedema bilateral lower extremities present  Assessment/ Plan: 89 y.o. female   Paroxysmal atrial fibrillation (HCC) - Plan: CoaguChek XS/INR Waived, metoprolol  tartrate (LOPRESSOR ) 50 MG tablet  Anticoagulation monitoring, INR range 2-3 - Plan: CoaguChek XS/INR Waived  Situational anxiety - Plan: busPIRone (BUSPAR) 5 MG tablet  [name]  Metoprolol  renewed.  BuSpar started for anxiety.    INR was therapeutic today.  No changes to Coumadin   Son reporting that she is refusing to have the mass looked at.  Discussed complications that could arise if in fact this is what we suspect to be a breast cancer.  She is at risk of infection, necrosis and metastases  Follow-up in 8 weeks, sooner if concerns arise   Eliodoro Guerin, DO [facility] [phone]

## 2023-11-17 ENCOUNTER — Other Ambulatory Visit: Payer: Self-pay | Admitting: Family Medicine

## 2023-11-17 DIAGNOSIS — I480 Paroxysmal atrial fibrillation: Secondary | ICD-10-CM

## 2023-11-18 DIAGNOSIS — I10 Essential (primary) hypertension: Secondary | ICD-10-CM | POA: Diagnosis not present

## 2023-11-18 DIAGNOSIS — S21001D Unspecified open wound of right breast, subsequent encounter: Secondary | ICD-10-CM | POA: Diagnosis not present

## 2023-11-18 DIAGNOSIS — N63 Unspecified lump in unspecified breast: Secondary | ICD-10-CM | POA: Diagnosis not present

## 2023-11-18 DIAGNOSIS — I480 Paroxysmal atrial fibrillation: Secondary | ICD-10-CM | POA: Diagnosis not present

## 2023-11-18 DIAGNOSIS — E039 Hypothyroidism, unspecified: Secondary | ICD-10-CM | POA: Diagnosis not present

## 2023-11-18 DIAGNOSIS — I495 Sick sinus syndrome: Secondary | ICD-10-CM | POA: Diagnosis not present

## 2023-11-22 DIAGNOSIS — I10 Essential (primary) hypertension: Secondary | ICD-10-CM | POA: Diagnosis not present

## 2023-11-22 DIAGNOSIS — E039 Hypothyroidism, unspecified: Secondary | ICD-10-CM | POA: Diagnosis not present

## 2023-11-22 DIAGNOSIS — N63 Unspecified lump in unspecified breast: Secondary | ICD-10-CM | POA: Diagnosis not present

## 2023-11-22 DIAGNOSIS — I480 Paroxysmal atrial fibrillation: Secondary | ICD-10-CM | POA: Diagnosis not present

## 2023-11-22 DIAGNOSIS — I495 Sick sinus syndrome: Secondary | ICD-10-CM | POA: Diagnosis not present

## 2023-11-22 DIAGNOSIS — S21001D Unspecified open wound of right breast, subsequent encounter: Secondary | ICD-10-CM | POA: Diagnosis not present

## 2023-11-25 ENCOUNTER — Telehealth: Payer: Self-pay | Admitting: Cardiovascular Disease

## 2023-11-25 NOTE — Telephone Encounter (Signed)
*  STAT* If patient is at the pharmacy, call can be transferred to refill team.   1. Which medications need to be refilled? (please list name of each medication and dose if known)   warfarin (COUMADIN ) 2.5 MG tablet   2. Would you like to learn more about the convenience, safety, & potential cost savings by using the [facility]?   3. Are you open to using the [facility] (Type [facility]. ).  4. Which pharmacy/location (including street and city if local pharmacy) is medication to be sent to?  [facility] - [place], [place] - [address]   5. Do they need a 30 day or 90 day supply?   90 days

## 2023-11-25 NOTE — Telephone Encounter (Signed)
 Called [facility] mail order pharmacy to inform them that they need to contact pt's PCP for a refill on pt's medication warfarin. I advised them that if they have any other problems, questions or concerns, to give our office a call back. Pharmacy representative verbalized understanding.

## 2023-11-29 DIAGNOSIS — S21001D Unspecified open wound of right breast, subsequent encounter: Secondary | ICD-10-CM | POA: Diagnosis not present

## 2023-11-29 DIAGNOSIS — E039 Hypothyroidism, unspecified: Secondary | ICD-10-CM | POA: Diagnosis not present

## 2023-11-29 DIAGNOSIS — I480 Paroxysmal atrial fibrillation: Secondary | ICD-10-CM | POA: Diagnosis not present

## 2023-11-29 DIAGNOSIS — I10 Essential (primary) hypertension: Secondary | ICD-10-CM | POA: Diagnosis not present

## 2023-11-29 DIAGNOSIS — N63 Unspecified lump in unspecified breast: Secondary | ICD-10-CM | POA: Diagnosis not present

## 2023-11-29 DIAGNOSIS — I495 Sick sinus syndrome: Secondary | ICD-10-CM | POA: Diagnosis not present

## 2023-12-09 DIAGNOSIS — E039 Hypothyroidism, unspecified: Secondary | ICD-10-CM | POA: Diagnosis not present

## 2023-12-09 DIAGNOSIS — I10 Essential (primary) hypertension: Secondary | ICD-10-CM | POA: Diagnosis not present

## 2023-12-09 DIAGNOSIS — I495 Sick sinus syndrome: Secondary | ICD-10-CM | POA: Diagnosis not present

## 2023-12-09 DIAGNOSIS — S21001D Unspecified open wound of right breast, subsequent encounter: Secondary | ICD-10-CM | POA: Diagnosis not present

## 2023-12-09 DIAGNOSIS — I480 Paroxysmal atrial fibrillation: Secondary | ICD-10-CM | POA: Diagnosis not present

## 2023-12-09 DIAGNOSIS — N63 Unspecified lump in unspecified breast: Secondary | ICD-10-CM | POA: Diagnosis not present

## 2023-12-27 ENCOUNTER — Telehealth: Payer: Self-pay | Admitting: Cardiovascular Disease

## 2023-12-27 MED ORDER — TRIAMTERENE-HCTZ 37.5-25 MG PO CAPS: 1.0000 | ORAL_CAPSULE | Freq: Every day | ORAL | 3 refills | Status: AC

## 2023-12-27 NOTE — Telephone Encounter (Signed)
*  STAT* If patient is at the pharmacy, call can be transferred to refill team.   1. Which medications need to be refilled? (please list name of each medication and dose if known)   triamterene -hydrochlorothiazide (DYAZIDE) 37.5-25 MG capsule    2. Which pharmacy/location (including street and city if local pharmacy) is medication to be sent to?  [facility] [place], [place] - [address]      3. Do they need a 30 day or 90 day supply? 90 day    Pt has appt scheduled in July.

## 2023-12-27 NOTE — Telephone Encounter (Signed)
 Pt's medication was sent to pt's pharmacy as requested. Confirmation received.

## 2024-01-06 ENCOUNTER — Ambulatory Visit: Payer: MEDICARE

## 2024-01-06 DIAGNOSIS — I495 Sick sinus syndrome: Secondary | ICD-10-CM | POA: Diagnosis not present

## 2024-01-07 LAB — CUP PACEART REMOTE DEVICE CHECK
Battery Remaining Longevity: 141 mo
Battery Voltage: 3.2 V
Brady Statistic AP VP Percent: 26.76 %
Brady Statistic AP VS Percent: 6.81 %
Brady Statistic AS VP Percent: 0.2 %
Brady Statistic AS VS Percent: 66.23 %
Brady Statistic RA Percent Paced: 37.8 %
Brady Statistic RV Percent Paced: 26.48 %
Date Time Interrogation Session: 20250710212141
Implantable Lead Connection Status: 753985
Implantable Lead Connection Status: 753985
Implantable Lead Implant Date: 20081126
Implantable Lead Implant Date: 20081126
Implantable Lead Location: 753859
Implantable Lead Location: 753860
Implantable Lead Model: 5076
Implantable Lead Model: 5076
Implantable Pulse Generator Implant Date: 20250410
Lead Channel Impedance Value: 323 Ohm
Lead Channel Impedance Value: 361 Ohm
Lead Channel Impedance Value: 361 Ohm
Lead Channel Impedance Value: 399 Ohm
Lead Channel Pacing Threshold Amplitude: 1.125 V
Lead Channel Pacing Threshold Amplitude: 2.375 V
Lead Channel Pacing Threshold Pulse Width: 0.4 ms
Lead Channel Pacing Threshold Pulse Width: 0.4 ms
Lead Channel Sensing Intrinsic Amplitude: 0.125 mV
Lead Channel Sensing Intrinsic Amplitude: 0.125 mV
Lead Channel Sensing Intrinsic Amplitude: 5.5 mV
Lead Channel Sensing Intrinsic Amplitude: 5.5 mV
Lead Channel Setting Pacing Amplitude: 2 V
Lead Channel Setting Pacing Amplitude: 2.5 V
Lead Channel Setting Pacing Pulse Width: 0.4 ms
Lead Channel Setting Sensing Sensitivity: 1.2 mV
Zone Setting Status: 755011
Zone Setting Status: 755011

## 2024-01-10 ENCOUNTER — Ambulatory Visit: Payer: Self-pay | Admitting: Cardiovascular Disease

## 2024-01-16 ENCOUNTER — Encounter: Payer: Self-pay | Admitting: Family Medicine

## 2024-01-16 ENCOUNTER — Ambulatory Visit (INDEPENDENT_AMBULATORY_CARE_PROVIDER_SITE_OTHER): Payer: MEDICARE | Admitting: Family Medicine

## 2024-01-16 VITALS — BP 130/61 | HR 60 | Temp 98.8°F | Ht 68.0 in | Wt 140.0 lb

## 2024-01-16 DIAGNOSIS — Z7901 Long term (current) use of anticoagulants: Secondary | ICD-10-CM

## 2024-01-16 DIAGNOSIS — R791 Abnormal coagulation profile: Secondary | ICD-10-CM | POA: Diagnosis not present

## 2024-01-16 DIAGNOSIS — N6314 Unspecified lump in the right breast, lower inner quadrant: Secondary | ICD-10-CM | POA: Diagnosis not present

## 2024-01-16 DIAGNOSIS — I480 Paroxysmal atrial fibrillation: Secondary | ICD-10-CM | POA: Diagnosis not present

## 2024-01-16 DIAGNOSIS — L03116 Cellulitis of left lower limb: Secondary | ICD-10-CM | POA: Diagnosis not present

## 2024-01-16 DIAGNOSIS — E039 Hypothyroidism, unspecified: Secondary | ICD-10-CM

## 2024-01-16 LAB — COAGUCHEK XS/INR WAIVED
INR: 3.4 — ABNORMAL HIGH (ref 0.9–1.1)
Prothrombin Time: 40.9 s

## 2024-01-16 MED ORDER — CEFTRIAXONE SODIUM 1 G IJ SOLR
1.0000 g | Freq: Once | INTRAMUSCULAR | Status: AC
Start: 2024-01-16 — End: 2024-01-16
  Administered 2024-01-16: 1 g via INTRAMUSCULAR

## 2024-01-16 MED ORDER — CEPHALEXIN 500 MG PO CAPS
500.0000 mg | ORAL_CAPSULE | Freq: Four times a day (QID) | ORAL | 0 refills | Status: AC
Start: 2024-01-16 — End: 2024-01-23

## 2024-01-16 NOTE — Patient Instructions (Signed)
 SKIP today's coumadin  dose. Then go to 1 tablet of coumadin  daily going forward. I really think you should have that breast mass looked at by a breast doctor.

## 2024-01-16 NOTE — Progress Notes (Signed)
 Subjective: CC: INR check up PCP: [name], DO YEP:Azuub DORETHY TOMEY is a 89 y.o. female presenting to clinic today for:  1.  A-fib INR goal 2-3 Patient here for interval checkup.  She is compliant with her Coumadin  2.5 mg daily except for 5 mg on Fridays.  No bleeding reported.  She does report that she has had some increasing left lower extremity swelling and pain after she was placed on some type of table for heart checkup.  She tries to keep her legs elevated when she is seated but her son, who is present for today's visit, reports that she just cannot sit down and relax.  She is always anxious about the health of her other 2 sons and feels like she has to be up and about all the time.  In fact, she apparently scratched her skin on the left forearm  2.  Breast mass Patient still has not had the mass evaluated and when pushed to inquire as to why she does not wish to discuss it.  She notes its checked by her heart doctor every time she goes.   ROS: Per HPI  Allergies  Allergen Reactions   Statins Other (See Comments)    Myalgia   Past Medical History:  Diagnosis Date   A-fib (HCC)    Dyslipidemia    Hyperlipidemia    Hypothyroidism    2 degree amidarone    Pacemaker    PAF (paroxysmal atrial fibrillation) (HCC)    S/P mitral valve repair    SSS (sick sinus syndrome) (HCC)    Systemic hypertension     Current Outpatient Medications:    cephALEXin  (KEFLEX ) 500 MG capsule, Take 1 capsule (500 mg total) by mouth every 6 (six) hours for 7 days. Start 01/17/24, Disp: 28 capsule, Rfl: 0   busPIRone  (BUSPAR ) 5 MG tablet, Take 1 tablet (5 mg total) by mouth 2 (two) times daily. For anxiety/ nerves, Disp: 180 tablet, Rfl: 3   Ferrous Sulfate (IRON) 28 MG TABS, Take 28 mg by mouth daily., Disp: , Rfl:    levothyroxine  (SYNTHROID ) 25 MCG tablet, Take 1 tablet (25 mcg total) by mouth daily., Disp: 90 tablet, Rfl: 3   metoprolol  tartrate (LOPRESSOR ) 50 MG tablet, TAKE 1  TABLET TWICE A DAY (KEEP UPCOMING APPOINTMENT FOR FUTURE REFILLS), Disp: 180 tablet, Rfl: 4   Polyethyl Glycol-Propyl Glycol (SYSTANE OP), Place 1 drop into both eyes as needed (dry eyes)., Disp: , Rfl:    silver  sulfADIAZINE  (SSD) 1 % cream, Apply 1 Application topically daily. To affected areas on foot until healed, Disp: 50 g, Rfl: 0   triamterene -hydrochlorothiazide (DYAZIDE) 37.5-25 MG capsule, Take 1 each (1 capsule total) by mouth daily., Disp: 90 capsule, Rfl: 3   warfarin (COUMADIN ) 2.5 MG tablet, TAKE 1 TABLET DAILY EXCEPT TAKE 2 TABLETS ON FRIDAYS, Disp: 90 tablet, Rfl: 3  Current Facility-Administered Medications:    cefTRIAXone  (ROCEPHIN ) injection 1 g, 1 g, Intramuscular, Once,  Social History   Socioeconomic History   Marital status: Widowed    Spouse name: Not on file   Number of children: 3   Years of education: Not on file   Highest education level: High school graduate  Occupational History   Occupation: Retired  Tobacco Use   Smoking status: Never   Smokeless tobacco: Never  Vaping Use   Vaping status: Never Used  Substance and Sexual Activity   Alcohol  use: No   Drug use: Never   Sexual activity: Not  Currently  Other Topics Concern   Not on file  Social History Narrative   Husband passed 06/29/2021   2 sons live with her   Social Drivers of Health   Financial Resource Strain: Low Risk  (09/19/2023)   Overall Financial Resource Strain (CARDIA)    Difficulty of Paying Living Expenses: Not hard at all  Food Insecurity: No Food Insecurity (09/19/2023)   Hunger Vital Sign    Worried About Running Out of Food in the Last Year: Never true    Ran Out of Food in the Last Year: Never true  Transportation Needs: No Transportation Needs (09/19/2023)   PRAPARE - Administrator, Civil Service (Medical): No    Lack of Transportation (Non-Medical): No  Physical Activity: Insufficiently Active (09/19/2023)   Exercise Vital Sign    Days of Exercise per Week: 3  days    Minutes of Exercise per Session: 30 min  Stress: No Stress Concern Present (09/19/2023)   [name]    Feeling of Stress : Not at all  Social Connections: Moderately Isolated (09/19/2023)   Social Connection and Isolation Panel    Frequency of Communication with Friends and Family: More than three times a week    Frequency of Social Gatherings with Friends and Family: More than three times a week    Attends Religious Services: More than 4 times per year    Active Member of [employer] or Organizations: No    Attends Banker Meetings: Never    Marital Status: Widowed  Intimate Partner Violence: Not At Risk (09/19/2023)   Humiliation, Afraid, Rape, and Kick questionnaire    Fear of Current or Ex-Partner: No    Emotionally Abused: No    Physically Abused: No    Sexually Abused: No   Family History  Problem Relation Age of Onset   Heart attack Mother    Heart attack Father    Parkinson's disease Son     Objective: Office vital signs reviewed. BP 130/61   Pulse 60   Temp 98.8 F (37.1 C)   Ht 5' 8 (1.727 m)   Wt 140 lb (63.5 kg)   SpO2 98%   BMI 21.29 kg/m   Physical Examination:  General: Awake, alert, well nourished, No acute distress HEENT: Sclera white.  Moist mucous membranes Cardio: regular rate MSK: Arrives in wheelchair.  Left lower extremity with moderate pitting edema to the and right lower extremity with mild to moderate pitting edema.  She has associated erythema, warmth to the left lower extremity.  Left forearm with hemostatic skin tear   Assessment/ Plan: 89 y.o. female   Paroxysmal atrial fibrillation (HCC) - Plan: CoaguChek XS/INR Waived  Supratherapeutic INR  Cellulitis of left leg - Plan: cefTRIAXone  (ROCEPHIN ) injection 1 g, cephALEXin  (KEFLEX ) 500 MG capsule  Anticoagulation monitoring, INR range 2-3 - Plan: CoaguChek XS/INR Waived  Acquired hypothyroidism - Plan:  TSH + free T4  [name]  Rate controlled.  INR is supratherapeutic at 3.4.  Skip today's dose of Coumadin  and then resume 1 tablet daily dosing going forward.  2-week follow-up scheduled for INR recheck  Left lower extremity consistent with cellulitis.  A gram of Rocephin  administered intramuscularly and then Keflex  to be started 4 times daily tomorrow  Did not discuss thyroid  today but we will check thyroid  levels along with other lab  I again addressed the mass of her breast but  she declines discussing this today.  I explained to her that a breast specialist would be most appropriate to continue evaluating this  Norene CHRISTELLA Fielding, DO [facility] [phone]

## 2024-01-17 ENCOUNTER — Ambulatory Visit: Payer: Self-pay | Admitting: Family Medicine

## 2024-01-17 LAB — TSH+FREE T4
Free T4: 1.25 ng/dL (ref 0.82–1.77)
TSH: 1.95 u[IU]/mL (ref 0.450–4.500)

## 2024-01-19 ENCOUNTER — Ambulatory Visit
Admission: RE | Admit: 2024-01-19 | Discharge: 2024-01-19 | Disposition: A | Discharge status: Home / Self Care | Payer: MEDICARE | Source: Ambulatory Visit | Attending: Vascular Surgery | Admitting: Vascular Surgery

## 2024-01-19 ENCOUNTER — Ambulatory Visit: Payer: Self-pay | Admitting: Cardiovascular Disease

## 2024-01-19 ENCOUNTER — Ambulatory Visit (INDEPENDENT_AMBULATORY_CARE_PROVIDER_SITE_OTHER): Payer: MEDICARE | Admitting: Cardiovascular Disease

## 2024-01-19 VITALS — BP 132/60 | HR 75 | Ht 68.0 in | Wt 144.0 lb

## 2024-01-19 DIAGNOSIS — T82110D Breakdown (mechanical) of cardiac electrode, subsequent encounter: Secondary | ICD-10-CM

## 2024-01-19 DIAGNOSIS — I480 Paroxysmal atrial fibrillation: Secondary | ICD-10-CM | POA: Diagnosis not present

## 2024-01-19 DIAGNOSIS — Z950 Presence of cardiac pacemaker: Secondary | ICD-10-CM | POA: Diagnosis not present

## 2024-01-19 DIAGNOSIS — I10 Essential (primary) hypertension: Secondary | ICD-10-CM | POA: Insufficient documentation

## 2024-01-19 DIAGNOSIS — Z9889 Other specified postprocedural states: Secondary | ICD-10-CM | POA: Diagnosis not present

## 2024-01-19 DIAGNOSIS — I495 Sick sinus syndrome: Secondary | ICD-10-CM

## 2024-01-19 DIAGNOSIS — N6315 Unspecified lump in the right breast, overlapping quadrants: Secondary | ICD-10-CM | POA: Diagnosis not present

## 2024-01-19 DIAGNOSIS — D6869 Other thrombophilia: Secondary | ICD-10-CM

## 2024-01-19 DIAGNOSIS — R600 Localized edema: Secondary | ICD-10-CM | POA: Diagnosis not present

## 2024-01-19 NOTE — Patient Instructions (Signed)
 Medication Instructions:  No changes *If you need a refill on your cardiac medications before your next appointment, please call your pharmacy*  Lab Work: No changes If you have labs (blood work) drawn today and your tests are completely normal, you will receive your results only by: MyChart Message (if you have MyChart) OR A paper copy in the mail If you have any lab test that is abnormal or we need to change your treatment, we will call you to review the results.  Testing/Procedures: Your physician has requested that you have a lower extremity venous duplex. This test is an ultrasound of the veins in the legs or arms. It looks at venous blood flow that carries blood from the heart to the legs or arms. Allow one hour for a Lower Venous exam. Allow thirty minutes for an Upper Venous exam. There are no restrictions or special instructions.  Please note: We ask at that you not bring children with you during ultrasound (echo/ vascular) testing. Due to room size and safety concerns, children are not allowed in the ultrasound rooms during exams. Our front office staff cannot provide observation of children in our lobby area while testing is being conducted. An adult accompanying a patient to their appointment will only be allowed in the ultrasound room at the discretion of the ultrasound technician under special circumstances. We apologize for any inconvenience.   Follow-Up: At [facility], you and your health needs are our priority.  As part of our continuing mission to provide you with exceptional heart care, our providers are all part of one team.  This team includes your primary Cardiologist (physician) and Advanced Practice Providers or APPs (Physician Assistants and Nurse Practitioners) who all work together to provide you with the care you need, when you need it.  Your next appointment:   3 month(s)  Provider:   Dr Francyne  We recommend signing up for the patient portal called  MyChart.  Sign up information is provided on this After Visit Summary.  MyChart is used to connect with patients for Virtual Visits (Telemedicine).  Patients are able to view lab/test results, encounter notes, upcoming appointments, etc.  Non-urgent messages can be sent to your provider as well.   To learn more about what you can do with MyChart, go to [email or web address].

## 2024-01-23 NOTE — Telephone Encounter (Signed)
 Left the information below on voicemail with call back number:  Jerel Balding, MD 01/19/2024  9:08 PM EDT     There is no evidence of clot in the veins of the left lower extremity.  Please continue the antibiotics for cellulitis.

## 2024-01-24 NOTE — Progress Notes (Signed)
 Cardiology office note   Date:  01/24/2024   ID:  [name], DOB 1929-05-26, MRN [id number] PCP:  [name], DO  Cardiologist:  Davidmichael Zarazua Electrophysiologist:  None   Evaluation Performed:  Follow-Up Visit  Chief Complaint:  Afib, pacemaker follow-up  History of Present Illness:    [name] is a 89 y.o. female with remote history of mitral valve annuloplasty and surgical ablation, sinus node dysfunction and paroxysmal atrial fibrillation with a dual-chamber permanent pacemaker (Medtronic 2008, original 5076 leads, Azure generator change out 2018 and again in April 2025).  She is accompanied by her son.  The pacemaker site has healed very well.  Unfortunately she continues to have problems with under sensing on the atrial lead (this is a problem both with bipolar and unipolar sensing programming; already programmed at lower limit of sensitivity 0.15 mV).  She is not pacemaker dependent and has mostly atrial sensed-ventricular sensed (sinus rhythm), occasionally with ventricular pacing for Wenckebach cycles.  Overall has 37% atrial pacing and only 27% ventricular pacing with decent heart rate histogram distribution.  Atrial pacing threshold remains acceptable.  Ventricular lead parameters are good with sensed R waves around 10 mV and capture threshold 0.75 V at 0.4 ms.  The burden of atrial fibrillation remains quite low at about 1.9%, mostly made up by 24-hour event that occurred in June.  Device programmed DDIR with a lower rate limit of 45 bpm.    She does not have any cardiovascular complaints and has not had any falls, dizziness or syncopal events.  She has had problems with a swollen and warm left calf and has been started on antibiotics.  She has not had fever or chills.  The leg is clearly asymmetrically swollen.  We did a lower extremity duplex venous ultrasound today for DVT which was negative.  Her INR most recently was 3.4 (followed at [facility] family  practice).  She continues to be very secretive about the large and ulcerated mass on her right breast.  She tells me that she is doing her own dressing changes. The diagnosis of breast cancer is virtually certain based on the appearance on mammography and the presence of enlarged right axillary lymph nodes.  There was a plan for biopsy but this has not been performed.  She reports that it has not been bleeding or draining.  She really does not want me to look at it or talk about it.  She has hypertension. She has a history of mitral valve annuloplasty and Cox Maze cryoablation procedure via minithoracotomy in 2009, has preserved left ventricular systolic function, no residual mitral insufficiency, normal coronary arteries at preoperative angiogram.   Past Medical History:  Diagnosis Date   A-fib (HCC)    Dyslipidemia    Hyperlipidemia    Hypothyroidism    2 degree amidarone    Pacemaker    PAF (paroxysmal atrial fibrillation) (HCC)    S/P mitral valve repair    SSS (sick sinus syndrome) (HCC)    Systemic hypertension    Past Surgical History:  Procedure Laterality Date   ABDOMINAL HYSTERECTOMY  1989   CARDIAC CATHETERIZATION  01/11/2008   mild-mod pulm. hypertension,normal coronaries   MINIMALLY INVASIVE MAZE PROCEDURE  02/2008   MITRAL VALVE REPAIR  September 2009   [facility] ring annuloplasty   NM MYOVIEW  LTD  03/28/2007   no ischemia   PACEMAKER PLACEMENT  November 2008   PPM GENERATOR CHANGEOUT N/A 08/25/2016   Procedure: PPM Generator Changeout -  Battery Only;  Surgeon: Jerel Balding, MD;  Location: [facility] INVASIVE CV LAB;  Service: Cardiovascular;  Laterality: N/A;   PPM GENERATOR CHANGEOUT N/A 10/06/2023   Procedure: PPM GENERATOR CHANGEOUT;  Surgeon: Balding Jerel, MD;  Location: [facility] INVASIVE CV LAB;  Service: Cardiovascular;  Laterality: N/A;   US  ECHOCARDIOGRAPHY  03/07/2012   mild LVH,EF =>55%,mild MR,mod TR,trace AI,right atrium mod. dilated     Current Meds   Medication Sig   busPIRone  (BUSPAR ) 5 MG tablet Take 1 tablet (5 mg total) by mouth 2 (two) times daily. For anxiety/ nerves   [EXPIRED] cephALEXin  (KEFLEX ) 500 MG capsule Take 1 capsule (500 mg total) by mouth every 6 (six) hours for 7 days. Start 01/17/24   Ferrous Sulfate (IRON) 28 MG TABS Take 28 mg by mouth daily.   levothyroxine  (SYNTHROID ) 25 MCG tablet Take 1 tablet (25 mcg total) by mouth daily.   metoprolol  tartrate (LOPRESSOR ) 50 MG tablet TAKE 1 TABLET TWICE A DAY (KEEP UPCOMING APPOINTMENT FOR FUTURE REFILLS)   Polyethyl Glycol-Propyl Glycol (SYSTANE OP) Place 1 drop into both eyes as needed (dry eyes).   silver  sulfADIAZINE  (SSD) 1 % cream Apply 1 Application topically daily. To affected areas on foot until healed   triamterene -hydrochlorothiazide (DYAZIDE) 37.5-25 MG capsule Take 1 each (1 capsule total) by mouth daily.   warfarin (COUMADIN ) 2.5 MG tablet TAKE 1 TABLET DAILY EXCEPT TAKE 2 TABLETS ON FRIDAYS     Allergies:   Statins   Social History   Tobacco Use   Smoking status: Never   Smokeless tobacco: Never  Vaping Use   Vaping status: Never Used  Substance Use Topics   Alcohol  use: No   Drug use: Never     Family Hx: The patient's family history includes Heart attack in her father and mother; Parkinson's disease in her son.  ROS:   Please see the history of present illness.    All other systems reviewed and are negative.   Prior CV studies:   The following studies were reviewed today: Comprehensive pacemaker check today performed by me  Labs/Other Tests and Data Reviewed:    EKG: .  EKG Interpretation Date/Time:  Thursday January 19 2024 08:23:39 EDT Ventricular Rate:  75 PR Interval:  240 QRS Duration:  78 QT Interval:  392 QTC Calculation: 437 R Axis:   -30  Text Interpretation: Sinus rhythm with 1st degree A-V block Left axis deviation Nonspecific ST abnormality When compared with ECG of 29-Sep-2023 10:00, Sinus rhythm has replaced Atrial  fibrillation Nonspecific T wave abnormality no longer evident in Lateral leads Confirmed by Marilin Kofman ([id number]) on 01/19/2024 8:35:47 AM         Recent Labs: 05/30/2023: ALT 8 10/03/2023: BUN 18; Creatinine, Ser 1.13; Hemoglobin 10.9; Platelets 237; Potassium 4.6; Sodium 140 01/16/2024: TSH 1.950   Recent Lipid Panel Lab Results  Component Value Date/Time   CHOL 223 (H) 05/21/2016 09:54 AM   TRIG 120 05/21/2016 09:54 AM   HDL 52 05/21/2016 09:54 AM   CHOLHDL 4.3 05/21/2016 09:54 AM   CHOLHDL 4.8 12/26/2012 10:57 AM   LDLCALC 147 (H) 05/21/2016 09:54 AM    Wt Readings from Last 3 Encounters:  01/19/24 144 lb (65.3 kg)  01/16/24 140 lb (63.5 kg)  11/09/23 145 lb 12.8 oz (66.1 kg)     Objective:    Vital Signs:  BP 132/60 (BP Location: Left Arm, Patient Position: Sitting, Cuff Size: Normal)   Pulse 75   Ht 5' 8 (1.727 m)  Wt 144 lb (65.3 kg)   SpO2 98%   BMI 21.90 kg/m      General: Alert, oriented x3, no distress, healthy left subclavian pacemaker site. Head: no evidence of trauma, PERRL, EOMI, no exophtalmos or lid lag, no myxedema, no xanthelasma; normal ears, nose and oropharynx Neck: normal jugular venous pulsations and no hepatojugular reflux; brisk carotid pulses without delay and no carotid bruits Chest: clear to auscultation, no signs of consolidation by percussion or palpation, normal fremitus, symmetrical and full respiratory excursions.  Dressing over the right breast which she does not want me to remove. Cardiovascular: normal position and quality of the apical impulse, regular rhythm, normal first and second heart sounds, no murmurs, rubs or gallops Abdomen: no tenderness or distention, no masses by palpation, no abnormal pulsatility or arterial bruits, normal bowel sounds, no hepatosplenomegaly Extremities: 2+ ankle and pedal edema on the right, 3+ swelling and erythema of the entire left calf. Neurological: grossly nonfocal Psych: Normal mood and  affect    ASSESSMENT & PLAN:    1. Paroxysmal atrial fibrillation (HCC)   2. Acquired thrombophilia (HCC)   3. Sinus node dysfunction (HCC)   4. Essential hypertension   5. Pacemaker   6. Pacemaker lead failure, subsequent encounter   7. History of mitral valve repair   8. Lower extremity edema   9. Mass overlapping multiple quadrants of right breast       AFib: The burden of atrial fibrillation is low at about 2%.  The episodes are asymptomatic and well rate controlled.  Antiarrhythmics are not justified.  CHADSVasc 4 (age 43, HTN, gender).  Although she has a history of mitral valve repair, she did not have rheumatic valvular heart disease.  Warfarin: Most recent INR was 3.4.  She denies bleeding problems.  May need a sooner recheck on her INR since she is getting antibiotics.  She had an office visit with Dr. Jolinda on August 4. Tachy-bradycardia sd: She is not pacemaker dependent. PM: Recent generator change.  Although she had a malfunctioning atrial lead we left it on revised due to their age and comorbid conditions, including breast cancer that has spread at least to the lymph nodes. HTN: Well-controlled Hx MV repair: Asymptomatic, without any systolic or diastolic murmurs on exam.  Conservative management.  She has not had symptoms of heart failure other than mild pedal edema.  Has not had an echocardiogram since September 2013.  If there will be a plan for major surgery, would recommend repeating an echocardiogram. Peripheral venous insufficiency: Keep legs elevated and continues in compression stockings.  The diagnosis of left leg cellulitis appears accurate, there is no evidence of DVT on ultrasound today.  She is receiving antibiotics. Right breast mass: Findings on mammogram and ultrasound confirm the impression of breast malignancy with metastasis to the axillary lymph nodes.  She has not had a biopsy.  She prefers not to talk about it.   The pacemaker generator change  procedure has been fully reviewed with the patient and written informed consent has been obtained.    Patient Instructions  Medication Instructions:  No changes *If you need a refill on your cardiac medications before your next appointment, please call your pharmacy*  Lab Work: No changes If you have labs (blood work) drawn today and your tests are completely normal, you will receive your results only by: MyChart Message (if you have MyChart) OR A paper copy in the mail If you have any lab test that is abnormal or  we need to change your treatment, we will call you to review the results.  Testing/Procedures: Your physician has requested that you have a lower extremity venous duplex. This test is an ultrasound of the veins in the legs or arms. It looks at venous blood flow that carries blood from the heart to the legs or arms. Allow one hour for a Lower Venous exam. Allow thirty minutes for an Upper Venous exam. There are no restrictions or special instructions.  Please note: We ask at that you not bring children with you during ultrasound (echo/ vascular) testing. Due to room size and safety concerns, children are not allowed in the ultrasound rooms during exams. Our front office staff cannot provide observation of children in our lobby area while testing is being conducted. An adult accompanying a patient to their appointment will only be allowed in the ultrasound room at the discretion of the ultrasound technician under special circumstances. We apologize for any inconvenience.   Follow-Up: At [facility], you and your health needs are our priority.  As part of our continuing mission to provide you with exceptional heart care, our providers are all part of one team.  This team includes your primary Cardiologist (physician) and Advanced Practice Providers or APPs (Physician Assistants and Nurse Practitioners) who all work together to provide you with the care you need, when you need  it.  Your next appointment:   3 month(s)  Provider:   Dr Francyne  We recommend signing up for the patient portal called MyChart.  Sign up information is provided on this After Visit Summary.  MyChart is used to connect with patients for Virtual Visits (Telemedicine).  Patients are able to view lab/test results, encounter notes, upcoming appointments, etc.  Non-urgent messages can be sent to your provider as well.   To learn more about what you can do with MyChart, go to [email or web address].         Signed, Jerel Francyne, MD  01/24/2024 5:10 PM    [facility]

## 2024-01-30 ENCOUNTER — Ambulatory Visit: Payer: MEDICARE | Admitting: Family Medicine

## 2024-01-30 ENCOUNTER — Encounter: Payer: Self-pay | Admitting: Family Medicine

## 2024-01-30 NOTE — Progress Notes (Deleted)
 Subjective: CC:*** PCP: [name], DO YEP:[name] is a 89 y.o. female presenting to clinic today for:  1. ***   ROS: Per HPI  Allergies  Allergen Reactions   Statins Other (See Comments)    Myalgia   Past Medical History:  Diagnosis Date   A-fib (HCC)    Dyslipidemia    Hyperlipidemia    Hypothyroidism    2 degree amidarone    Pacemaker    PAF (paroxysmal atrial fibrillation) (HCC)    S/P mitral valve repair    SSS (sick sinus syndrome) (HCC)    Systemic hypertension     Current Outpatient Medications:    busPIRone  (BUSPAR ) 5 MG tablet, Take 1 tablet (5 mg total) by mouth 2 (two) times daily. For anxiety/ nerves, Disp: 180 tablet, Rfl: 3   Ferrous Sulfate (IRON) 28 MG TABS, Take 28 mg by mouth daily., Disp: , Rfl:    levothyroxine  (SYNTHROID ) 25 MCG tablet, Take 1 tablet (25 mcg total) by mouth daily., Disp: 90 tablet, Rfl: 3   metoprolol  tartrate (LOPRESSOR ) 50 MG tablet, TAKE 1 TABLET TWICE A DAY (KEEP UPCOMING APPOINTMENT FOR FUTURE REFILLS), Disp: 180 tablet, Rfl: 4   Polyethyl Glycol-Propyl Glycol (SYSTANE OP), Place 1 drop into both eyes as needed (dry eyes)., Disp: , Rfl:    silver  sulfADIAZINE  (SSD) 1 % cream, Apply 1 Application topically daily. To affected areas on foot until healed, Disp: 50 g, Rfl: 0   triamterene -hydrochlorothiazide (DYAZIDE) 37.5-25 MG capsule, Take 1 each (1 capsule total) by mouth daily., Disp: 90 capsule, Rfl: 3   warfarin (COUMADIN ) 2.5 MG tablet, TAKE 1 TABLET DAILY EXCEPT TAKE 2 TABLETS ON FRIDAYS, Disp: 90 tablet, Rfl: 3 Social History   Socioeconomic History   Marital status: Widowed    Spouse name: Not on file   Number of children: 3   Years of education: Not on file   Highest education level: High school graduate  Occupational History   Occupation: Retired  Tobacco Use   Smoking status: Never   Smokeless tobacco: Never  Vaping Use   Vaping status: Never Used  Substance and Sexual Activity   Alcohol   use: No   Drug use: Never   Sexual activity: Not Currently  Other Topics Concern   Not on file  Social History Narrative   Husband passed 06/29/2021   2 sons live with her   Social Drivers of Corporate investment banker Strain: Low Risk  (09/19/2023)   Overall Financial Resource Strain (CARDIA)    Difficulty of Paying Living Expenses: Not hard at all  Food Insecurity: No Food Insecurity (09/19/2023)   Hunger Vital Sign    Worried About Running Out of Food in the Last Year: Never true    Ran Out of Food in the Last Year: Never true  Transportation Needs: No Transportation Needs (09/19/2023)   PRAPARE - Administrator, Civil Service (Medical): No    Lack of Transportation (Non-Medical): No  Physical Activity: Insufficiently Active (09/19/2023)   Exercise Vital Sign    Days of Exercise per Week: 3 days    Minutes of Exercise per Session: 30 min  Stress: No Stress Concern Present (09/19/2023)   [name]    Feeling of Stress : Not at all  Social Connections: Moderately Isolated (09/19/2023)   Social Connection and Isolation Panel    Frequency of Communication with Friends and Family: More than three times a  week    Frequency of Social Gatherings with Friends and Family: More than three times a week    Attends Religious Services: More than 4 times per year    Active Member of [employer] or Organizations: No    Attends Banker Meetings: Never    Marital Status: Widowed  Intimate Partner Violence: Not At Risk (09/19/2023)   Humiliation, Afraid, Rape, and Kick questionnaire    Fear of Current or Ex-Partner: No    Emotionally Abused: No    Physically Abused: No    Sexually Abused: No   Family History  Problem Relation Age of Onset   Heart attack Mother    Heart attack Father    Parkinson's disease Son     Objective: Office vital signs reviewed. There were no vitals taken for this  visit.  Physical Examination:  General: Awake, alert, *** nourished, No acute distress HEENT: Normal    Neck: No masses palpated. No lymphadenopathy    Ears: Tympanic membranes intact, normal light reflex, no erythema, no bulging    Eyes: PERRLA, extraocular membranes intact, sclera ***    Nose: nasal turbinates moist, *** nasal discharge    Throat: moist mucus membranes, no erythema, *** tonsillar exudate.  Airway is patent Cardio: regular rate and rhythm, S1S2 heard, no murmurs appreciated Pulm: clear to auscultation bilaterally, no wheezes, rhonchi or rales; normal work of breathing on room air GI: soft, non-tender, non-distended, bowel sounds present x4, no hepatomegaly, no splenomegaly, no masses GU: external vaginal tissue ***, cervix ***, *** punctate lesions on cervix appreciated, *** discharge from cervical os, *** bleeding, *** cervical motion tenderness, *** abdominal/ adnexal masses Extremities: warm, well perfused, No edema, cyanosis or clubbing; +*** pulses bilaterally MSK: *** gait and *** station Skin: dry; intact; no rashes or lesions Neuro: *** Strength and light touch sensation grossly intact, *** DTRs ***/4  Assessment/ Plan: 89 y.o. female   Paroxysmal atrial fibrillation (HCC)  Anticoagulation monitoring, INR range 2-3  Cellulitis of left leg  ***   Forever Arechiga CHRISTELLA Fielding, DO [facility] [phone]

## 2024-02-02 DIAGNOSIS — L97521 Non-pressure chronic ulcer of other part of left foot limited to breakdown of skin: Secondary | ICD-10-CM | POA: Diagnosis not present

## 2024-02-15 ENCOUNTER — Encounter: Payer: Self-pay | Admitting: Family Medicine

## 2024-02-15 ENCOUNTER — Ambulatory Visit (INDEPENDENT_AMBULATORY_CARE_PROVIDER_SITE_OTHER): Payer: MEDICARE

## 2024-02-15 VITALS — BP 120/59 | HR 66 | Temp 97.7°F | Ht 68.0 in | Wt 144.0 lb

## 2024-02-15 DIAGNOSIS — R791 Abnormal coagulation profile: Secondary | ICD-10-CM | POA: Diagnosis not present

## 2024-02-15 DIAGNOSIS — Z7901 Long term (current) use of anticoagulants: Secondary | ICD-10-CM | POA: Diagnosis not present

## 2024-02-15 DIAGNOSIS — N6314 Unspecified lump in the right breast, lower inner quadrant: Secondary | ICD-10-CM | POA: Diagnosis not present

## 2024-02-15 DIAGNOSIS — I480 Paroxysmal atrial fibrillation: Secondary | ICD-10-CM | POA: Diagnosis not present

## 2024-02-15 LAB — COAGUCHEK XS/INR WAIVED
INR: 4.1 — ABNORMAL HIGH (ref 0.9–1.1)
Prothrombin Time: 49.3 s

## 2024-02-15 NOTE — Progress Notes (Signed)
 Subjective: CC:INR check PCP: Jolinda Melissa HERO, DO YEP:[name] is a 89 y.o. female, who is accompanied today's visit by her son.  She is presenting to clinic today for:  Discussed the use of AI scribe software for clinical note transcription with the patient, who gave verbal consent to proceed.  History of Present Illness   [name] is a 89 year old female with atrial fibrillation who presents with elevated INR levels.  She is experiencing elevated INR levels, with the most recent reading noted to be high. Her INR was 3.4 at the last visit, and there was a missed appointment in between. She is currently taking Coumadin  (warfarin) at a dose of 2.5 mg daily, which was adjusted from a previous regimen of 2.5 mg daily with 5 mg on Fridays. Despite this adjustment, her INR remains elevated.  She recently took antibiotics, which she believes may have affected her INR levels. During the antibiotic course, she took half a dose of Coumadin  and then resumed the full dose afterwards. She is concerned that her current dose might be too high.  She is also taking Buspar  for anxiety, which she uses occasionally and finds helpful. She feels a little better with its use.  In her family history, she mentions having a son who has undergone multiple surgeries on his foot and knee, resulting in ongoing issues. Another son has Parkinson's disease and diabetes, contributing to her anxiety about their health.     She continues to decline evaluation of her breast mass.  ROS: Per HPI  Allergies  Allergen Reactions   Statins Other (See Comments)    Myalgia   Past Medical History:  Diagnosis Date   A-fib (HCC)    Dyslipidemia    Hyperlipidemia    Hypothyroidism    2 degree amidarone    Pacemaker    PAF (paroxysmal atrial fibrillation) (HCC)    S/P mitral valve repair    SSS (sick sinus syndrome) (HCC)    Systemic hypertension     Current Outpatient Medications:    busPIRone   (BUSPAR ) 5 MG tablet, Take 1 tablet (5 mg total) by mouth 2 (two) times daily. For anxiety/ nerves, Disp: 180 tablet, Rfl: 3   Ferrous Sulfate (IRON) 28 MG TABS, Take 28 mg by mouth daily., Disp: , Rfl:    levothyroxine  (SYNTHROID ) 25 MCG tablet, Take 1 tablet (25 mcg total) by mouth daily., Disp: 90 tablet, Rfl: 3   metoprolol  tartrate (LOPRESSOR ) 50 MG tablet, TAKE 1 TABLET TWICE A DAY (KEEP UPCOMING APPOINTMENT FOR FUTURE REFILLS), Disp: 180 tablet, Rfl: 4   Polyethyl Glycol-Propyl Glycol (SYSTANE OP), Place 1 drop into both eyes as needed (dry eyes)., Disp: , Rfl:    silver  sulfADIAZINE  (SSD) 1 % cream, Apply 1 Application topically daily. To affected areas on foot until healed, Disp: 50 g, Rfl: 0   triamterene -hydrochlorothiazide (DYAZIDE) 37.5-25 MG capsule, Take 1 each (1 capsule total) by mouth daily., Disp: 90 capsule, Rfl: 3   warfarin (COUMADIN ) 2.5 MG tablet, TAKE 1 TABLET DAILY EXCEPT TAKE 2 TABLETS ON FRIDAYS, Disp: 90 tablet, Rfl: 3 Social History   Socioeconomic History   Marital status: Widowed    Spouse name: Not on file   Number of children: 3   Years of education: Not on file   Highest education level: High school graduate  Occupational History   Occupation: Retired  Tobacco Use   Smoking status: Never   Smokeless tobacco: Never  Vaping Use  Vaping status: Never Used  Substance and Sexual Activity   Alcohol  use: No   Drug use: Never   Sexual activity: Not Currently  Other Topics Concern   Not on file  Social History Narrative   Husband passed 06/29/2021   2 sons live with her   Social Drivers of Corporate investment banker Strain: Low Risk  (09/19/2023)   Overall Financial Resource Strain (CARDIA)    Difficulty of Paying Living Expenses: Not hard at all  Food Insecurity: No Food Insecurity (09/19/2023)   Hunger Vital Sign    Worried About Running Out of Food in the Last Year: Never true    Ran Out of Food in the Last Year: Never true  Transportation Needs:  No Transportation Needs (09/19/2023)   PRAPARE - Administrator, Civil Service (Medical): No    Lack of Transportation (Non-Medical): No  Physical Activity: Insufficiently Active (09/19/2023)   Exercise Vital Sign    Days of Exercise per Week: 3 days    Minutes of Exercise per Session: 30 min  Stress: No Stress Concern Present (09/19/2023)   [name]    Feeling of Stress : Not at all  Social Connections: Moderately Isolated (09/19/2023)   Social Connection and Isolation Panel    Frequency of Communication with Friends and Family: More than three times a week    Frequency of Social Gatherings with Friends and Family: More than three times a week    Attends Religious Services: More than 4 times per year    Active Member of [employer] or Organizations: No    Attends Banker Meetings: Never    Marital Status: Widowed  Intimate Partner Violence: Not At Risk (09/19/2023)   Humiliation, Afraid, Rape, and Kick questionnaire    Fear of Current or Ex-Partner: No    Emotionally Abused: No    Physically Abused: No    Sexually Abused: No   Family History  Problem Relation Age of Onset   Heart attack Mother    Heart attack Father    Parkinson's disease Son     Objective: Office vital signs reviewed. BP (!) 120/59   Pulse 66   Temp 97.7 F (36.5 C)   Ht 5' 8 (1.727 m)   Wt 144 lb (65.3 kg)   SpO2 99%   BMI 21.90 kg/m   Physical Examination:  General: Awake, alert, nontoxic elderly female, No acute distress HEENT: Sclera white.  Moist mucous membranes Cardio: Bradycardic rate with irregular rhythm, S1S2 heard, no murmurs appreciated Pulm: clear to auscultation bilaterally, no wheezes, rhonchi or rales; normal work of breathing on room air MSK: Arrives in wheelchair     01/16/2024   11:03 AM 11/16/2023    9:53 AM 09/21/2023   10:28 AM  Depression screen PHQ 2/9  Decreased Interest 0 0 0  Down,  Depressed, Hopeless 0 0 0  PHQ - 2 Score 0 0 0  Altered sleeping 0 0 0  Tired, decreased energy 0 0 0  Change in appetite 0 0 0  Feeling bad or failure about yourself  0 0 0  Trouble concentrating 0 0 0  Moving slowly or fidgety/restless 0 0 0  Suicidal thoughts 0 0 0  PHQ-9 Score 0 0 0  Difficult doing work/chores Not difficult at all Not difficult at all Not difficult at all      01/16/2024   11:03 AM 11/16/2023    9:52 AM  09/21/2023   10:28 AM 07/29/2023    2:42 PM  GAD 7 : Generalized Anxiety Score  Nervous, Anxious, on Edge 0 0 0 0  Control/stop worrying 0 0 0 0  Worry too much - different things 0 0 0 0  Trouble relaxing 0 0 0 0  Restless 0 0 0 0  Easily annoyed or irritable 0 0 0 0  Afraid - awful might happen 0 0 0 0  Total GAD 7 Score 0 0 0 0  Anxiety Difficulty  Not difficult at all Not difficult at all    Assessment/ Plan: 89 y.o. female   Paroxysmal atrial fibrillation (HCC) - Plan: CoaguChek XS/INR Waived  Supratherapeutic INR - Plan: CoaguChek XS/INR Waived  Anticoagulation monitoring, INR range 2-3 - Plan: CoaguChek XS/INR Waived, CoaguChek XS/INR Waived     Atrial fibrillation with supratherapeutic INR on warfarin INR elevated due to antibiotic interaction with warfarin metabolism. Previous dosage adjustments insufficient, further modification needed to prevent bleeding. - Instructed to skip warfarin doses for today and tomorrow. - Adjusted warfarin dosage to 0.5 tablet daily, 1 tablet on Saturdays and Sundays. - Recheck INR in two weeks.  Anxiety disorder Improvement in anxiety symptoms with Buspar , no interaction with warfarin. - Continue current Buspar  regimen as needed for anxiety.          Melissa CHRISTELLA Fielding, DO [facility] [phone]

## 2024-02-16 ENCOUNTER — Ambulatory Visit: Payer: MEDICARE | Admitting: Family Medicine

## 2024-02-16 ENCOUNTER — Encounter: Payer: Self-pay | Admitting: Nurse Practitioner

## 2024-02-16 ENCOUNTER — Ambulatory Visit: Payer: Self-pay

## 2024-02-16 ENCOUNTER — Ambulatory Visit: Payer: MEDICARE | Admitting: Nurse Practitioner

## 2024-02-16 VITALS — BP 146/73 | HR 58 | Temp 97.8°F | Ht 68.0 in | Wt 134.8 lb

## 2024-02-16 DIAGNOSIS — H04129 Dry eye syndrome of unspecified lacrimal gland: Secondary | ICD-10-CM

## 2024-02-16 DIAGNOSIS — S40811A Abrasion of right upper arm, initial encounter: Secondary | ICD-10-CM | POA: Diagnosis not present

## 2024-02-16 NOTE — Progress Notes (Signed)
 Subjective:  Patient ID: [name], female    DOB: February 22, 1929, 89 y.o.   MRN: [id number]  Patient Care Team: [name], DO as PCP - General (Family Medicine) Frances, Ozell RAMAN, LCSW as [facility] Care Management (Licensed Clinical Social Worker) Croitoru, Jerel, MD as Consulting Physician (Cardiology) Mona Vinie BROCKS, MD as Consulting Physician (Cardiology)   Chief Complaint:  No chief complaint on file.   HPI: [name] is a 89 y.o. female presenting on 02/16/2024 for No chief complaint on file.   Discussed the use of AI scribe software for clinical note transcription with the patient, who gave verbal consent to proceed.  History of Present Illness [name] is a 89 year old female who presents with shakiness and leg swelling. She is accompanied by her son.  She experiences shakiness, particularly when wearing her glasses, which makes her head feel funny. No headaches, double vision, or light sensitivity. Her son notes she tolerated light exposure well when coming in, despite prolonged sun exposure.  She has chronic leg swelling, which is not as severe currently. This is a longstanding issue for her.  She feels shaky when nervous, and her son mentions she has been falling asleep quickly, which is unusual. She typically sleeps from 9 PM to 6 AM without interruption.  She reports having dry eyes     Right arm Abrasion Patient presents with a right arm abrasion after bumping her arm against a wall just prior to the appointment. She is on warfarin therapy and reports that the site was bleeding initially but has since slowed, now with minimal bleeding noted. On exam, a very superficial abrasion is observed on the right arm with no active bleeding, no surrounding erythema, swelling, or drainage. The wound is consistent with a minor superficial injury. The site was cleansed, topical antibiotic ointment applied, and covered with a clean bandage.  Patient was educated on wound care, monitoring for signs of infection, and to seek care if bleeding persists or worsens given her anticoagulation therapy  Relevant past medical, surgical, family, and social history reviewed and updated as indicated.  Allergies and medications reviewed and updated. Data reviewed: Chart in Epic.   Past Medical History:  Diagnosis Date   A-fib (HCC)    Dyslipidemia    Hyperlipidemia    Hypothyroidism    2 degree amidarone    Pacemaker    PAF (paroxysmal atrial fibrillation) (HCC)    S/P mitral valve repair    SSS (sick sinus syndrome) (HCC)    Systemic hypertension     Past Surgical History:  Procedure Laterality Date   ABDOMINAL HYSTERECTOMY  1989   CARDIAC CATHETERIZATION  01/11/2008   mild-mod pulm. hypertension,normal coronaries   MINIMALLY INVASIVE MAZE PROCEDURE  02/2008   MITRAL VALVE REPAIR  September 2009   [facility] ring annuloplasty   NM MYOVIEW  LTD  03/28/2007   no ischemia   PACEMAKER PLACEMENT  November 2008   PPM GENERATOR CHANGEOUT N/A 08/25/2016   Procedure: PPM Generator Changeout - Battery Only;  Surgeon: Jerel Balding, MD;  Location: [facility] INVASIVE CV [facility];  Service: Cardiovascular;  Laterality: N/A;   PPM GENERATOR CHANGEOUT N/A 10/06/2023   Procedure: PPM GENERATOR CHANGEOUT;  Surgeon: Balding Jerel, MD;  Location: [facility] INVASIVE CV LAB;  Service: Cardiovascular;  Laterality: N/A;   US  ECHOCARDIOGRAPHY  03/07/2012   mild LVH,EF =>55%,mild MR,mod TR,trace AI,right atrium mod. dilated    Social History   Socioeconomic History  Marital status: Widowed    Spouse name: Not on file   Number of children: 3   Years of education: Not on file   Highest education level: High school graduate  Occupational History   Occupation: Retired  Tobacco Use   Smoking status: Never   Smokeless tobacco: Never  Vaping Use   Vaping status: Never Used  Substance and Sexual Activity   Alcohol  use: No   Drug use: Never   Sexual activity:  Not Currently  Other Topics Concern   Not on file  Social History Narrative   Husband passed 06/29/2021   2 sons live with her   Social Drivers of Corporate investment banker Strain: Low Risk  (09/19/2023)   Overall Financial Resource Strain (CARDIA)    Difficulty of Paying Living Expenses: Not hard at all  Food Insecurity: No Food Insecurity (09/19/2023)   Hunger Vital Sign    Worried About Running Out of Food in the Last Year: Never true    Ran Out of Food in the Last Year: Never true  Transportation Needs: No Transportation Needs (09/19/2023)   PRAPARE - Administrator, Civil Service (Medical): No    Lack of Transportation (Non-Medical): No  Physical Activity: Insufficiently Active (09/19/2023)   Exercise Vital Sign    Days of Exercise per Week: 3 days    Minutes of Exercise per Session: 30 min  Stress: No Stress Concern Present (09/19/2023)   [name]    Feeling of Stress : Not at all  Social Connections: Moderately Isolated (09/19/2023)   Social Connection and Isolation Panel    Frequency of Communication with Friends and Family: More than three times a week    Frequency of Social Gatherings with Friends and Family: More than three times a week    Attends Religious Services: More than 4 times per year    Active Member of [employer] or Organizations: No    Attends Banker Meetings: Never    Marital Status: Widowed  Intimate Partner Violence: Not At Risk (09/19/2023)   Humiliation, Afraid, Rape, and Kick questionnaire    Fear of Current or Ex-Partner: No    Emotionally Abused: No    Physically Abused: No    Sexually Abused: No    Outpatient Encounter Medications as of 02/16/2024  Medication Sig   busPIRone  (BUSPAR ) 5 MG tablet Take 1 tablet (5 mg total) by mouth 2 (two) times daily. For anxiety/ nerves   Ferrous Sulfate (IRON) 28 MG TABS Take 28 mg by mouth daily.   levothyroxine  (SYNTHROID )  25 MCG tablet Take 1 tablet (25 mcg total) by mouth daily.   metoprolol  tartrate (LOPRESSOR ) 50 MG tablet TAKE 1 TABLET TWICE A DAY (KEEP UPCOMING APPOINTMENT FOR FUTURE REFILLS)   Polyethyl Glycol-Propyl Glycol (SYSTANE OP) Place 1 drop into both eyes as needed (dry eyes).   silver  sulfADIAZINE  (SSD) 1 % cream Apply 1 Application topically daily. To affected areas on foot until healed   triamterene -hydrochlorothiazide (DYAZIDE) 37.5-25 MG capsule Take 1 each (1 capsule total) by mouth daily.   warfarin (COUMADIN ) 2.5 MG tablet TAKE 1 TABLET DAILY EXCEPT TAKE 2 TABLETS ON FRIDAYS   No facility-administered encounter medications on file as of 02/16/2024.    Allergies  Allergen Reactions   Statins Other (See Comments)    Myalgia    Pertinent ROS per HPI, otherwise unremarkable      Objective:  BP (!) 146/73  Pulse (!) 58   Temp 97.8 F (36.6 C) (Temporal)   Ht 5' 8 (1.727 m)   Wt 134 lb 12.8 oz (61.1 kg)   SpO2 97%   BMI 20.50 kg/m    Wt Readings from Last 3 Encounters:  02/16/24 134 lb 12.8 oz (61.1 kg)  02/15/24 144 lb (65.3 kg)  01/19/24 144 lb (65.3 kg)    Physical Exam Vitals and nursing note reviewed.  Constitutional:      General: She is not in acute distress. HENT:     Head: Normocephalic and atraumatic.     Nose: Nose normal.  Eyes:     Extraocular Movements: Extraocular movements intact.     Conjunctiva/sclera: Conjunctivae normal.     Pupils: Pupils are equal, round, and reactive to light.  Cardiovascular:     Heart sounds: Normal heart sounds.  Pulmonary:     Effort: Pulmonary effort is normal.     Breath sounds: Normal breath sounds.  Skin:    General: Skin is warm and dry.     Findings: Abrasion present.     Comments: Right arm dressing with bacitracin applied  Neurological:     Mental Status: She is alert and oriented to person, place, and time.     Comments: W/c fro ambulation  Psychiatric:        Mood and Affect: Mood normal.         Behavior: Behavior normal.        Thought Content: Thought content normal.        Judgment: Judgment normal.    Physical Exam      Results for orders placed or performed in visit on 02/15/24  CoaguChek XS/INR Waived   Collection Time: 02/15/24  3:30 PM  Result Value Ref Range   INR 4.1 (H) 0.9 - 1.1   Prothrombin Time 49.3 sec       Pertinent labs & imaging results that were available during my care of the patient were reviewed by me and considered in my medical decision making.  Assessment & Plan:  Diagnoses and all orders for this visit:  Eye dryness     Assessment and Plan Assessment & Plan Right upper arm abrasion Abrasion on the right upper arm, not severe. - Apply triple antibiotic ointment. - Keep area dry and wrapped today. - Avoid getting the abrasion wet.  Dry eye syndrome Complained of dry eyes, not using prescription drops. - Advise use of over-the-counter teardrops.  Right arm superficial abrasion in the setting of anticoagulation (warfarin).  Low risk for complications at this time.   Cleanse abrasion with soap and water.  Apply topical antibiotic ointment and cover with a clean bandage.  Educated patient on wound care and signs of infection (increased redness, swelling, warmth, drainage, fever).  Monitor for prolonged bleeding due to warfarin use; advised to return if bleeding does not stop or increases.  No further intervention required at this time. Continue all other maintenance medications.  Follow up plan: Return if symptoms worsen or fail to improve.   Educational handout given for Artificial tears: Use preservative-free artificial tears 3-4 times a day or as directed.  Warm compresses: Apply a warm, moist washcloth to closed eyelids for 5-10 minutes daily to improve oil gland function.  Blink often: Especially when reading, using a phone, or working on a computer. Follow the 20-20-20 rule (every 20 minutes, look 20 feet away for 20  seconds).  Humidify your environment: Use a humidifier indoors, avoid direct  air from fans, heaters, or air conditioning.  Stay hydrated: Drink enough water throughout the day.  Sunglasses: Wear wrap-around sunglasses outdoors to protect from wind, dust, and sunlight.  Diet: Eating foods rich in omega-3 fatty acids (salmon, flaxseed, walnuts) may improve tear quality.  The above assessment and management plan was discussed with the patient. The patient verbalized understanding of and has agreed to the management plan. Patient is aware to call the clinic if they develop any new symptoms or if symptoms persist or worsen. Patient is aware when to return to the clinic for a follow-up visit. Patient educated on when it is appropriate to go to the emergency department.  Bayleigh Loflin [place] Thompson, DNP [facility] [address] [place], [place] [address] [phone]

## 2024-02-16 NOTE — Telephone Encounter (Signed)
 Pt has appt

## 2024-02-16 NOTE — Telephone Encounter (Signed)
 FYI Only or Action Required?: FYI only for provider.  Patient was last seen in primary care on 02/15/2024 by [name], DO.  Called Nurse Triage reporting Shaking.  Symptoms began yesterday.  Interventions attempted: Nothing.  Symptoms are: unchanged.  Triage Disposition: See HCP Within 4 Hours (Or PCP Triage), See Physician Within 24 Hours  Patient/caregiver understands and will follow disposition?: Yes     Copied from CRM #[id number]. Topic: Clinical - Medical Advice >> Feb 16, 2024  8:34 AM Montie POUR wrote: Reason for CRM:  Mercede is shaky; no fever; no pain. It is making her have anxiety. This started last night. Please call son, [name], at [phone] to discuss what to do. No appointment is available today. Reason for Disposition  [1] MODERATE leg swelling (e.g., swelling extends up to knees) AND [2] new-onset or getting worse  [1] MODERATE weakness or fatigue AND [2] from poor fluid intake AND [3] no improvement after 2 hours of rest and fluids  Answer Assessment - Initial Assessment Questions 1. DESCRIPTION: Describe how you are feeling.     Wants sleep all the time and has the shakes 2. SEVERITY: How bad is it?  Can you stand and walk?     Yes  3. ONSET: When did these symptoms begin? (e.g., hours, days, weeks, months)     Last night 4. CAUSE: What do you think is causing the weakness or fatigue? (e.g., not drinking enough fluids, medical problem, trouble sleeping)     unknown 5. NEW MEDICINES:  Have you started on any new medicines recently? (e.g., opioid pain medicines, benzodiazepines, muscle relaxants, antidepressants, antihistamines, neuroleptics, beta blockers)     no 6. OTHER SYMPTOMS: Do you have any other symptoms? (e.g., chest pain, fever, cough, SOB, vomiting, diarrhea, bleeding, other areas of pain)     Doesn't want anything to tough her head. Her head is shaking a lot  Answer Assessment - Initial Assessment Questions 1. ONSET: When  did the swelling start? (e.g., minutes, hours, days)     ongoing 2. LOCATION: What part of the leg is swollen?  Are both legs swollen or just one leg?     Right leg 3. SEVERITY: How bad is the swelling? (e.g., localized; mild, moderate, severe)     mod 4. REDNESS: Is there redness or signs of infection?     yes 5. PAIN: Is the swelling painful to touch? If Yes, ask: How painful is it?   (Scale 1-10; mild, moderate or severe)     Yes complained of knee pain 6. FEVER: Do you have a fever? If Yes, ask: What is it, how was it measured, and when did it start?      no 7. CAUSE: What do you think is causing the leg swelling?     Doesn't know  9. RECURRENT SYMPTOM: Have you had leg swelling before? If Yes, ask: When was the last time? What happened that time?     Yes had cellulitis a month or so ago  10. OTHER SYMPTOMS: Do you have any other symptoms? (e.g., chest pain, difficulty breathing)       no  Protocols used: Weakness (Generalized) and Fatigue-A-AH, Leg Swelling and Edema-A-AH

## 2024-02-29 ENCOUNTER — Encounter: Payer: Self-pay | Admitting: Family Medicine

## 2024-02-29 ENCOUNTER — Ambulatory Visit (INDEPENDENT_AMBULATORY_CARE_PROVIDER_SITE_OTHER): Payer: MEDICARE | Admitting: Family Medicine

## 2024-02-29 VITALS — BP 138/62 | HR 72 | Ht 68.0 in | Wt 135.5 lb

## 2024-02-29 DIAGNOSIS — I480 Paroxysmal atrial fibrillation: Secondary | ICD-10-CM

## 2024-02-29 DIAGNOSIS — R791 Abnormal coagulation profile: Secondary | ICD-10-CM

## 2024-02-29 DIAGNOSIS — Z7901 Long term (current) use of anticoagulants: Secondary | ICD-10-CM | POA: Diagnosis not present

## 2024-02-29 DIAGNOSIS — Z9889 Other specified postprocedural states: Secondary | ICD-10-CM

## 2024-02-29 LAB — COAGUCHEK XS/INR WAIVED
INR: 3.7 — ABNORMAL HIGH (ref 0.9–1.1)
Prothrombin Time: 44.1 s

## 2024-02-29 NOTE — Progress Notes (Signed)
 We have always considered her valvular A-fib patient and treated her with Coumadin , but I definitely think it is worth a risk/benefit discussion and Eliquis would probably be safer for her.

## 2024-02-29 NOTE — Progress Notes (Signed)
 Subjective: CC:INR check PCP: Jolinda Melissa HERO, DO YEP:[name] is a 89 y.o. female presenting to clinic today for:  History of Present Illness  Patient is brought to the office by her son.  She is here for A-fib follow-up.  She apparently did not reduce her Coumadin  to 0.5 mg daily last visit and has continued 1 tablet daily.  She apparently misunderstood the directions.  She reports no bleeding episodes.  She has not had any changes in diet.    ROS: Per HPI  Allergies  Allergen Reactions   Statins Other (See Comments)    Myalgia   Past Medical History:  Diagnosis Date   A-fib (HCC)    Dyslipidemia    Hyperlipidemia    Hypothyroidism    2 degree amidarone    Pacemaker    PAF (paroxysmal atrial fibrillation) (HCC)    S/P mitral valve repair    SSS (sick sinus syndrome) (HCC)    Systemic hypertension     Current Outpatient Medications:    busPIRone  (BUSPAR ) 5 MG tablet, Take 1 tablet (5 mg total) by mouth 2 (two) times daily. For anxiety/ nerves, Disp: 180 tablet, Rfl: 3   Ferrous Sulfate (IRON) 28 MG TABS, Take 28 mg by mouth daily., Disp: , Rfl:    levothyroxine  (SYNTHROID ) 25 MCG tablet, Take 1 tablet (25 mcg total) by mouth daily., Disp: 90 tablet, Rfl: 3   metoprolol  tartrate (LOPRESSOR ) 50 MG tablet, TAKE 1 TABLET TWICE A DAY (KEEP UPCOMING APPOINTMENT FOR FUTURE REFILLS), Disp: 180 tablet, Rfl: 4   Polyethyl Glycol-Propyl Glycol (SYSTANE OP), Place 1 drop into both eyes as needed (dry eyes)., Disp: , Rfl:    silver  sulfADIAZINE  (SSD) 1 % cream, Apply 1 Application topically daily. To affected areas on foot until healed, Disp: 50 g, Rfl: 0   triamterene -hydrochlorothiazide (DYAZIDE) 37.5-25 MG capsule, Take 1 each (1 capsule total) by mouth daily., Disp: 90 capsule, Rfl: 3   warfarin (COUMADIN ) 2.5 MG tablet, TAKE 1 TABLET DAILY EXCEPT TAKE 2 TABLETS ON FRIDAYS, Disp: 90 tablet, Rfl: 3 Social History   Socioeconomic History   Marital status: Widowed     Spouse name: Not on file   Number of children: 3   Years of education: Not on file   Highest education level: High school graduate  Occupational History   Occupation: Retired  Tobacco Use   Smoking status: Never   Smokeless tobacco: Never  Vaping Use   Vaping status: Never Used  Substance and Sexual Activity   Alcohol  use: No   Drug use: Never   Sexual activity: Not Currently  Other Topics Concern   Not on file  Social History Narrative   Husband passed 06/29/2021   2 sons live with her   Social Drivers of Corporate investment banker Strain: Low Risk  (09/19/2023)   Overall Financial Resource Strain (CARDIA)    Difficulty of Paying Living Expenses: Not hard at all  Food Insecurity: No Food Insecurity (09/19/2023)   Hunger Vital Sign    Worried About Running Out of Food in the Last Year: Never true    Ran Out of Food in the Last Year: Never true  Transportation Needs: No Transportation Needs (09/19/2023)   PRAPARE - Administrator, Civil Service (Medical): No    Lack of Transportation (Non-Medical): No  Physical Activity: Insufficiently Active (09/19/2023)   Exercise Vital Sign    Days of Exercise per Week: 3 days    Minutes  of Exercise per Session: 30 min  Stress: No Stress Concern Present (09/19/2023)   [name]    Feeling of Stress : Not at all  Social Connections: Moderately Isolated (09/19/2023)   Social Connection and Isolation Panel    Frequency of Communication with Friends and Family: More than three times a week    Frequency of Social Gatherings with Friends and Family: More than three times a week    Attends Religious Services: More than 4 times per year    Active Member of [employer] or Organizations: No    Attends Banker Meetings: Never    Marital Status: Widowed  Intimate Partner Violence: Not At Risk (09/19/2023)   Humiliation, Afraid, Rape, and Kick questionnaire    Fear of  Current or Ex-Partner: No    Emotionally Abused: No    Physically Abused: No    Sexually Abused: No   Family History  Problem Relation Age of Onset   Heart attack Mother    Heart attack Father    Parkinson's disease Son     Objective: Office vital signs reviewed. BP 138/62   Pulse 72   Ht 5' 8 (1.727 m)   Wt 135 lb 8 oz (61.5 kg)   SpO2 99%   BMI 20.60 kg/m   Physical Examination:  General: Awake, alert, nontoxic elderly female, No acute distress HEENT: sclera white, MMM Cardio: regular rate and rhythm, S1S2 heard, no murmurs appreciated Pulm: clear to auscultation bilaterally, no wheezes, rhonchi or rales; normal work of breathing on room air MSK: arrives in wheelchair  Assessment/ Plan: 89 y.o. female  Assessment & Plan  Supratherapeutic INR  Paroxysmal atrial fibrillation (HCC) - Plan: CoaguChek XS/INR Waived  Anticoagulation monitoring, INR range 2-3 - Plan: CoaguChek XS/INR Waived  S/P mitral valve repair - Plan: CoaguChek XS/INR Waived  INR supratherapeutic though downtrending from previous at 3.7 today.  I reiterated the previous instructions which were half tablet daily except for the weekends where she will take 1 full tablet.  I printed out her calendar as well as reiterated this verbally multiple times during her visit.  Will set her up for 2-week follow-up, sooner if concerns arise.  I have also reached out to her cardiologist to see if she might be a candidate for something like Eliquis or Xarelto.  I understand that she has a history of mitral valve repair and I did discuss with the family that valvular atrial fibrillation could not be treated with any of the newer agents.  Melissa CHRISTELLA Fielding, DO [facility] [phone]

## 2024-03-06 ENCOUNTER — Ambulatory Visit: Payer: MEDICARE | Admitting: Family Medicine

## 2024-03-08 ENCOUNTER — Ambulatory Visit (INDEPENDENT_AMBULATORY_CARE_PROVIDER_SITE_OTHER): Payer: MEDICARE | Admitting: Family Medicine

## 2024-03-08 ENCOUNTER — Ambulatory Visit: Payer: Self-pay

## 2024-03-08 ENCOUNTER — Encounter: Payer: Self-pay | Admitting: Family Medicine

## 2024-03-08 VITALS — BP 112/74 | Temp 97.8°F | Ht 68.0 in | Wt 135.0 lb

## 2024-03-08 DIAGNOSIS — C50111 Malignant neoplasm of central portion of right female breast: Secondary | ICD-10-CM

## 2024-03-08 DIAGNOSIS — L97521 Non-pressure chronic ulcer of other part of left foot limited to breakdown of skin: Secondary | ICD-10-CM | POA: Diagnosis not present

## 2024-03-08 DIAGNOSIS — C50919 Malignant neoplasm of unspecified site of unspecified female breast: Secondary | ICD-10-CM | POA: Diagnosis not present

## 2024-03-08 NOTE — Telephone Encounter (Signed)
 Appt made for today

## 2024-03-08 NOTE — Telephone Encounter (Signed)
 FYI Only or Action Required?: FYI only for provider.  Patient was last seen in primary care on 02/29/2024 by [name], DO.  Called Nurse Triage reporting Bleeding/Bruising.  Symptoms began bleeding began today; wound has been ongoing for a couple of years.  Interventions attempted: Other: direct pressure.  Symptoms are: gradually improving.  Triage Disposition: See Physician Within 24 Hours  Patient/caregiver understands and will follow disposition?: Yes  **Appt. Scheduled for 9/11**              Copied from CRM #[id number]. Topic: Clinical - Red Word Triage >> Mar 08, 2024  9:31 AM Graeme ORN wrote: Red Word that prompted transfer to Nurse Triage: Sore oozing bad on blood thinner. Reason for Disposition  [1] Looks infected (e.g., spreading redness, pus) AND [2] no fever  Answer Assessment - Initial Assessment Questions 1. APPEARANCE of INJURY: What does the injury look like?       Un- healed wound.   2. ONSET: How long ago did the injury occur?      Son stated x 2 years ago, it has developed, and patient wont let son look at the area.   3. LOCATION: Where is the injury located?       Left side of breast, has  breast cancer diagnosis   4. SIZE: How large is the cut?      Son is unaware, he is not with patient   5. BLEEDING: Is it bleeding now? If Yes, ask: Is it difficult to stop?       Yes, for a couple of hours. Son is unaware of how she is stopping the bleeding.   6. PAIN: Is there any pain? If Yes, ask: How bad is the pain? (Scale 0-10; or none, mild, moderate, severe)      No pain  7. MECHANISM: Tell me how it happened.   Possible Breast CA diagnosis   Son called in stating the patient has a chronic wound under her left breast that has been ongoing for a couple of years, he reports the patient is on blood thinners, and the bleeding has been ongoing for a couple of hours. He is not currently with the patient so he is unaware  of the measures she is taking to stop the bleeding. He reports that she is in denial about the situation, and wont let anyone take a look at the wound. She is not being followed by an oncologist, and son stated PCP may be unaware. Son is seeking same day appt. For patient to be seen; appt. Scheduled for 9/11 with Dr. Zollie.  Protocols used: Cuts and Lacerations-A-AH

## 2024-03-08 NOTE — Progress Notes (Unsigned)
 Subjective:  Patient ID: [name], female    DOB: 02/18/1929  Age: 89 y.o. MRN: [id number]  CC: chronic wound (Left breast,  X 2 years(?))   HPI  Discussed the use of AI scribe software for clinical note transcription with the patient, who gave verbal consent to proceed.  History of Present Illness [name] is a 89 year old female who presents with a bleeding wound on her right breast. She is accompanied by her son, [name].  She has a bleeding wound on her right breast, which has been present for a long time. The wound initially developed after a surgical procedure for heart valve surgery. Recently, the wound began bleeding. No pain is associated with the wound.  A mammogram was performed last year, but the details of the findings are unclear.  Her medical history includes heart valve surgery and pacemaker insertion. She has had a blood transfusion in the past due to bleeding issues. She recalls that the wound started after a puncture was made during her heart valve surgery.  She lives with her brothers, who are currently in poor health. She has previously received home health care for a foot problem and expresses a strong dislike for the facility where she received care.          01/16/2024   11:03 AM 11/16/2023    9:53 AM 09/21/2023   10:28 AM  Depression screen PHQ 2/9  Decreased Interest 0 0 0  Down, Depressed, Hopeless 0 0 0  PHQ - 2 Score 0 0 0  Altered sleeping 0 0 0  Tired, decreased energy 0 0 0  Change in appetite 0 0 0  Feeling bad or failure about yourself  0 0 0  Trouble concentrating 0 0 0  Moving slowly or fidgety/restless 0 0 0  Suicidal thoughts 0 0 0  PHQ-9 Score 0 0 0  Difficult doing work/chores Not difficult at all Not difficult at all Not difficult at all    History [name] has a past medical history of A-fib (HCC), Dyslipidemia, Hyperlipidemia, Hypothyroidism, Pacemaker, PAF (paroxysmal atrial fibrillation) (HCC), S/P mitral valve repair,  SSS (sick sinus syndrome) (HCC), and Systemic hypertension.   She has a past surgical history that includes Mitral valve repair (September 2009); pacemaker placement (November 2008); Minimally invasive maze procedure (02/2008); Abdominal hysterectomy (1989); US  ECHOCARDIOGRAPHY (03/07/2012); NM MYOVIEW  LTD (03/28/2007); Cardiac catheterization (01/11/2008); PPM GENERATOR CHANGEOUT (N/A, 08/25/2016); and PPM GENERATOR CHANGEOUT (N/A, 10/06/2023).   Her family history includes Heart attack in her father and mother; Parkinson's disease in her son.She reports that she has never smoked. She has never used smokeless tobacco. She reports that she does not drink alcohol  and does not use drugs.    ROS Review of Systems  Objective:  BP 112/74   Temp 97.8 F (36.6 C)   Ht 5' 8 (1.727 m)   Wt 135 lb (61.2 kg)   SpO2 99%   BMI 20.53 kg/m   BP Readings from Last 3 Encounters:  03/08/24 112/74  02/29/24 138/62  02/16/24 (!) 146/73    Wt Readings from Last 3 Encounters:  03/08/24 135 lb (61.2 kg)  02/29/24 135 lb 8 oz (61.5 kg)  02/16/24 134 lb 12.8 oz (61.1 kg)     Physical Exam   Assessment & Plan:  There are no diagnoses linked to this encounter.  Assessment and Plan Assessment & Plan Bleeding wound of right breast with suspected malignancy   A chronic wound on her  right breast has recently started bleeding and is suspected to be cancerous. At 50, she and her family must decide between surgical intervention and conservative management. The wound's active bleeding requires immediate attention to prevent complications. Cancer presence may impede healing, possibly necessitating surgical intervention for proper wound closure. She prefers to avoid a care facility, influencing decision-making. Refer her to a wound care center for specialized treatment. Apply a bandage with Neosporin and gauze to manage bleeding. Arrange home health services for dressing changes. Discuss the potential need for  surgical intervention to remove cancerous tissue if wound care alone is insufficient.       Follow-up: No follow-ups on file.  Butler Der, M.D.

## 2024-03-11 ENCOUNTER — Encounter: Payer: Self-pay | Admitting: Family Medicine

## 2024-03-14 ENCOUNTER — Encounter: Payer: Self-pay | Admitting: Family Medicine

## 2024-03-14 ENCOUNTER — Ambulatory Visit (INDEPENDENT_AMBULATORY_CARE_PROVIDER_SITE_OTHER): Payer: MEDICARE | Admitting: Family Medicine

## 2024-03-14 VITALS — BP 138/58 | HR 64 | Temp 97.1°F | Ht 68.0 in | Wt 135.0 lb

## 2024-03-14 DIAGNOSIS — I480 Paroxysmal atrial fibrillation: Secondary | ICD-10-CM

## 2024-03-14 DIAGNOSIS — C50919 Malignant neoplasm of unspecified site of unspecified female breast: Secondary | ICD-10-CM

## 2024-03-14 DIAGNOSIS — R791 Abnormal coagulation profile: Secondary | ICD-10-CM | POA: Diagnosis not present

## 2024-03-14 LAB — COAGUCHEK XS/INR WAIVED
INR: 2.9 — ABNORMAL HIGH (ref 0.9–1.1)
Prothrombin Time: 35.2 s

## 2024-03-14 MED ORDER — APIXABAN 5 MG PO TABS: 5.0000 mg | ORAL_TABLET | Freq: Two times a day (BID) | ORAL | 12 refills | Status: AC

## 2024-03-14 MED ORDER — SILVER SULFADIAZINE 1 % EX CREA: 1.0000 | TOPICAL_CREAM | Freq: Every day | CUTANEOUS | 0 refills | Status: AC

## 2024-03-14 NOTE — Progress Notes (Signed)
 Subjective: CC:INR check PCP: [name], DO YEP:Azuub H Mis is a 89 y.o. female presenting to clinic today for: History of Present Illness   Patient here for interval follow-up on atrial fibrillation and chronic anticoagulation.  She is noted to have supratherapeutic INR on 02/29/2024 and was given instructions on how to reduce medication.  She has done this and is accompanied today by her son.  Since our last visit I have been able to speak with her cardiologist who thought that DOACs would in fact be a little less risky, despite her having valvular atrial fibrillation, for this patient who has very labile INRs, advanced age, high risk of fall and chronic medical issues which complicate her case.  She is very amenable to this change.  She goes on to his state that she is ready now to intervene with the right breast.  She has had imaging earlier this year which demonstrated findings concerning for breast cancer but never proceeded with ongoing evaluation and/or treatment.  She notes that the wound in the medial right breast seems to be getting worse and draining more.  She was evaluated recently and referred to wound care but there are concerns that this may be insufficient.   ROS: Per HPI  Allergies  Allergen Reactions   Statins Other (See Comments)    Myalgia   Past Medical History:  Diagnosis Date   A-fib (HCC)    Dyslipidemia    Hyperlipidemia    Hypothyroidism    2 degree amidarone    Pacemaker    PAF (paroxysmal atrial fibrillation) (HCC)    S/P mitral valve repair    SSS (sick sinus syndrome) (HCC)    Systemic hypertension     Current Outpatient Medications:    busPIRone  (BUSPAR ) 5 MG tablet, Take 1 tablet (5 mg total) by mouth 2 (two) times daily. For anxiety/ nerves, Disp: 180 tablet, Rfl: 3   Ferrous Sulfate (IRON) 28 MG TABS, Take 28 mg by mouth daily., Disp: , Rfl:    levothyroxine  (SYNTHROID ) 25 MCG tablet, Take 1 tablet (25 mcg total) by mouth  daily., Disp: 90 tablet, Rfl: 3   metoprolol  tartrate (LOPRESSOR ) 50 MG tablet, TAKE 1 TABLET TWICE A DAY (KEEP UPCOMING APPOINTMENT FOR FUTURE REFILLS), Disp: 180 tablet, Rfl: 4   Polyethyl Glycol-Propyl Glycol (SYSTANE OP), Place 1 drop into both eyes as needed (dry eyes)., Disp: , Rfl:    silver  sulfADIAZINE  (SSD) 1 % cream, Apply 1 Application topically daily. To affected areas on foot until healed, Disp: 50 g, Rfl: 0   triamterene -hydrochlorothiazide (DYAZIDE) 37.5-25 MG capsule, Take 1 each (1 capsule total) by mouth daily., Disp: 90 capsule, Rfl: 3   warfarin (COUMADIN ) 2.5 MG tablet, TAKE 1 TABLET DAILY EXCEPT TAKE 2 TABLETS ON FRIDAYS, Disp: 90 tablet, Rfl: 3 Social History   Socioeconomic History   Marital status: Widowed    Spouse name: Not on file   Number of children: 3   Years of education: Not on file   Highest education level: High school graduate  Occupational History   Occupation: Retired  Tobacco Use   Smoking status: Never   Smokeless tobacco: Never  Vaping Use   Vaping status: Never Used  Substance and Sexual Activity   Alcohol  use: No   Drug use: Never   Sexual activity: Not Currently  Other Topics Concern   Not on file  Social History Narrative   Husband passed 06/29/2021   2 sons live with her  Social Drivers of Corporate investment banker Strain: Low Risk  (09/19/2023)   Overall Financial Resource Strain (CARDIA)    Difficulty of Paying Living Expenses: Not hard at all  Food Insecurity: No Food Insecurity (09/19/2023)   Hunger Vital Sign    Worried About Running Out of Food in the Last Year: Never true    Ran Out of Food in the Last Year: Never true  Transportation Needs: No Transportation Needs (09/19/2023)   PRAPARE - Administrator, Civil Service (Medical): No    Lack of Transportation (Non-Medical): No  Physical Activity: Insufficiently Active (09/19/2023)   Exercise Vital Sign    Days of Exercise per Week: 3 days    Minutes of  Exercise per Session: 30 min  Stress: No Stress Concern Present (09/19/2023)   [name]    Feeling of Stress : Not at all  Social Connections: Moderately Isolated (09/19/2023)   Social Connection and Isolation Panel    Frequency of Communication with Friends and Family: More than three times a week    Frequency of Social Gatherings with Friends and Family: More than three times a week    Attends Religious Services: More than 4 times per year    Active Member of [employer] or Organizations: No    Attends Banker Meetings: Never    Marital Status: Widowed  Intimate Partner Violence: Not At Risk (09/19/2023)   Humiliation, Afraid, Rape, and Kick questionnaire    Fear of Current or Ex-Partner: No    Emotionally Abused: No    Physically Abused: No    Sexually Abused: No   Family History  Problem Relation Age of Onset   Heart attack Mother    Heart attack Father    Parkinson's disease Son     Objective: Office vital signs reviewed. BP (!) 138/58   Pulse 64   Temp (!) 97.1 F (36.2 C)   Ht 5' 8 (1.727 m)   Wt 135 lb (61.2 kg)   SpO2 98%   BMI 20.53 kg/m   Physical Examination:  General: Awake, alert, nontoxic elderly female, No acute distress HEENT: sclera white, MMM Cardio: Irregularly irregular with rate controlled.,  S1, S2 heard. Pulm: clear to auscultation bilaterally, no wheezes, rhonchi or rales; normal work of breathing on room air Breast: Right breast with ulcerated, oozing lesion noted in the areolar region affecting both the medial upper and lower quadrant of the breast.  There is palpable firmness in this area consistent with mass  Assessment/ Plan: 89 y.o. female  Assessment & Plan   Paroxysmal atrial fibrillation (HCC) - Plan: CoaguChek XS/INR Waived, apixaban  (ELIQUIS ) 5 MG TABS tablet  Neoplasm of breast with edema or ulceration of breast skin or satellite skin nodule confined to  breast (pT4b) (HCC) - Plan: Ambulatory referral to General Surgery, silver  sulfADIAZINE  (SSD) 1 % cream  I have switched her to Eliquis  and given her a 1 week supply of medication.  While she meets age requirement for reduced dose her BMI and creatinine do not warrant it so I proceed with 5 mg twice daily.  She knows to discontinue Coumadin  but continue beta-blocker  Her breast is very concerning for progression of her cancer that sadly has not been further evaluated because she has refused further intervention previously but is now amenable to this.  I have placed stat referral back to Dr. Vanderbilt, who was working with her previously.  I have  also given her some wound care instructions and prescribed SSD cream  to apply to the affected area.  Keep wound care evaluation but we discussed this is unlikely to resolve with simple room care and is really going to need intervention to get this rectified   Laketha Leopard CHRISTELLA Fielding, DO [facility] [phone]

## 2024-03-29 ENCOUNTER — Other Ambulatory Visit: Payer: Self-pay | Admitting: Surgery

## 2024-03-29 DIAGNOSIS — C50112 Malignant neoplasm of central portion of left female breast: Secondary | ICD-10-CM | POA: Diagnosis not present

## 2024-04-03 LAB — SURGICAL PATHOLOGY

## 2024-04-05 NOTE — Progress Notes (Signed)
 Remote PPM Transmission

## 2024-04-06 ENCOUNTER — Ambulatory Visit: Payer: MEDICARE

## 2024-04-06 DIAGNOSIS — I480 Paroxysmal atrial fibrillation: Secondary | ICD-10-CM | POA: Diagnosis not present

## 2024-04-06 LAB — CUP PACEART REMOTE DEVICE CHECK
Battery Remaining Longevity: 134 mo
Battery Voltage: 3.16 V
Brady Statistic AP VP Percent: 33.99 %
Brady Statistic AP VS Percent: 8.18 %
Brady Statistic AS VP Percent: 0.37 %
Brady Statistic AS VS Percent: 57.46 %
Brady Statistic RA Percent Paced: 51.9 %
Brady Statistic RV Percent Paced: 34.32 %
Date Time Interrogation Session: 20251010093852
Implantable Lead Connection Status: 753985
Implantable Lead Connection Status: 753985
Implantable Lead Implant Date: 20081126
Implantable Lead Implant Date: 20081126
Implantable Lead Location: 753859
Implantable Lead Location: 753860
Implantable Lead Model: 5076
Implantable Lead Model: 5076
Implantable Pulse Generator Implant Date: 20250410
Lead Channel Impedance Value: 285 Ohm
Lead Channel Impedance Value: 342 Ohm
Lead Channel Impedance Value: 361 Ohm
Lead Channel Impedance Value: 418 Ohm
Lead Channel Pacing Threshold Amplitude: 1.125 V
Lead Channel Pacing Threshold Amplitude: 2.375 V
Lead Channel Pacing Threshold Pulse Width: 0.4 ms
Lead Channel Pacing Threshold Pulse Width: 0.4 ms
Lead Channel Sensing Intrinsic Amplitude: 0.125 mV
Lead Channel Sensing Intrinsic Amplitude: 0.125 mV
Lead Channel Sensing Intrinsic Amplitude: 6.625 mV
Lead Channel Sensing Intrinsic Amplitude: 6.625 mV
Lead Channel Setting Pacing Amplitude: 2 V
Lead Channel Setting Pacing Amplitude: 2.5 V
Lead Channel Setting Pacing Pulse Width: 0.4 ms
Lead Channel Setting Sensing Sensitivity: 1.2 mV
Zone Setting Status: 755011
Zone Setting Status: 755011

## 2024-04-09 ENCOUNTER — Telehealth: Payer: Self-pay | Admitting: Family Medicine

## 2024-04-09 NOTE — Telephone Encounter (Signed)
 Copied from CRM [id number]. Topic: General - Call Back - No Documentation >> Apr 09, 2024  1:30 PM Rachelle R wrote: Reason for CRM: Patients son [name] states he got a call today from the office but no VM left. Is requesting a call back to discuss.  [name] can be reached at [phone]

## 2024-04-09 NOTE — Progress Notes (Signed)
 Remote PPM Transmission

## 2024-04-09 NOTE — Telephone Encounter (Signed)
 Spoke with son and advised we received notification that she called called after hours over the weekend but I see in her chart where oncology is aware and handling issue.

## 2024-04-10 ENCOUNTER — Ambulatory Visit: Payer: Self-pay | Admitting: *Deleted

## 2024-04-10 NOTE — Telephone Encounter (Signed)
 FYI Only or Action Required?: FYI only for provider.  Patient was last seen in primary care on 03/14/2024 by [name], DO.  Called Nurse Triage reporting Bleeding/Bruising.  Symptoms began several days ago.  Interventions attempted: Rest, hydration, or home remedies and Other: gauze dressing applied to wound over weekend .  Symptoms are: stable. No bleeding now. No worsening sx of urinary frequency   Triage Disposition: See Physician Within 24 Hours  Patient/caregiver understands and will follow disposition?: Yes            Copied from CRM #[id number]. Topic: Clinical - Red Word Triage >> Apr 10, 2024 10:10 AM Gustabo D wrote: Pt has had a lot of bleeding over the weekend she has a open wound on her right breast. Says it stopped. Her son isn't sure if theres any swelling or redness. Reason for Disposition  Wound infection suspected by triager (e.g., other signs of wound infection)  Answer Assessment - Initial Assessment Questions Appt scheduled for tomorrow with PCP. Patient son reports his mother does not feel comfortable with him looking at wound under right breast and son had female neighbor to come and dress wound with gauze until seen by PCP. Recommended if bleeding occurs again and to apply pressure and if does not stop go to ED.recommended increase fluid intake of water if no restrictions, for any other urinary sx.  If sx worsen for urinary frequency or if pain urinating reported,  go to UC. Patient son is not sure at this time but will ask patient today      1. LOCATION: Where is the wound located?      Under right breast per patient son  2. WOUND APPEARANCE: What does the wound look like?      Open area was bleeding over weekend per patient son and female neighbor reports appearance 3. SIZE: If redness is present, ask: What is the size of the red area? (Inches, centimeters, or compare to size of a coin)      Unsure  4. SPREAD: What's changed in the  last day?  Do you see any red streaks coming from the wound?     Bleeding has stopped but wants PCP to evaluate 5. ONSET: When did it start to look infected?      Na  6. MECHANISM: How did the wound start, what was the cause?     Possible cancer dx and thinks wound is from cancer 7. PAIN: Do you have any pain?  If Yes, ask: How bad is the pain?  (e.g., Scale 1-10; mild, moderate, or severe)     na 8. FEVER: Do you have a fever? If Yes, ask: What is your temperature, how was it measured, and when did it start?     Denies  9. OTHER SYMPTOMS: Do you have any other symptoms? (e.g., shaking chills, weakness, rash elsewhere on body)     Wound under right breast open, was bleeding Saturday and now stopped. Patient take eliquis . Urinary frequency  10. PREGNANCY: Is there any chance you are pregnant? When was your last menstrual period?       na  Protocols used: Wound Infection Suspected-A-AH

## 2024-04-10 NOTE — Telephone Encounter (Signed)
 Fyi NOTED APPT SCHEDULED WITH PCP TOMORROW

## 2024-04-11 ENCOUNTER — Ambulatory Visit: Payer: Self-pay | Admitting: Family Medicine

## 2024-04-11 ENCOUNTER — Ambulatory Visit: Payer: MEDICARE | Admitting: Family Medicine

## 2024-04-11 ENCOUNTER — Encounter: Payer: Self-pay | Admitting: Family Medicine

## 2024-04-11 VITALS — BP 111/57 | HR 60 | Temp 97.0°F | Wt 135.0 lb

## 2024-04-11 DIAGNOSIS — S21001D Unspecified open wound of right breast, subsequent encounter: Secondary | ICD-10-CM | POA: Diagnosis not present

## 2024-04-11 DIAGNOSIS — C50911 Malignant neoplasm of unspecified site of right female breast: Secondary | ICD-10-CM

## 2024-04-11 DIAGNOSIS — R350 Frequency of micturition: Secondary | ICD-10-CM | POA: Diagnosis not present

## 2024-04-11 DIAGNOSIS — C50919 Malignant neoplasm of unspecified site of unspecified female breast: Secondary | ICD-10-CM

## 2024-04-11 DIAGNOSIS — T148XXA Other injury of unspecified body region, initial encounter: Secondary | ICD-10-CM

## 2024-04-11 LAB — URINALYSIS, COMPLETE
Bilirubin, UA: NEGATIVE
Glucose, UA: NEGATIVE
Ketones, UA: NEGATIVE
Leukocytes,UA: NEGATIVE
Nitrite, UA: NEGATIVE
Protein,UA: NEGATIVE
Specific Gravity, UA: 1.01 (ref 1.005–1.030)
Urobilinogen, Ur: 0.2 mg/dL (ref 0.2–1.0)
pH, UA: 7 (ref 5.0–7.5)

## 2024-04-11 LAB — MICROSCOPIC EXAMINATION
RBC, Urine: NONE SEEN /HPF (ref 0–2)
Renal Epithel, UA: NONE SEEN /HPF
Yeast, UA: NONE SEEN

## 2024-04-11 LAB — HEMOGLOBIN, FINGERSTICK: Hemoglobin: 10.6 g/dL — ABNORMAL LOW (ref 11.1–15.9)

## 2024-04-11 NOTE — Progress Notes (Signed)
 Subjective: CC: Bleeding wound, urinary frequency PCP: [name], DO YEP:[name] is a 89 y.o. female presenting to clinic today for:  Bleeding Patient with known breast cancer in the right breast.  She is accompanied today by her son.  Since our last visit, she has reestablished with her breast surgeon who was able to successfully perform biopsy on the breast.  This demonstrated invasive breast cancer.  She has subsequently been referred stat to specialist and is awaiting call currently to schedule appointment.  On Saturday, she apparently took off the bandage and it tore some of the skin of the breast and caused quite a bit of bleeding.  According to her son, this actually caused her to be frightened but she seems nonchalant about it today.  She continues to do wound care at home.  She denies any active bleeding.  She is compliant with Eliquis  and aside from cost it is been very tolerated.  She denies any excessive fatigue, shortness of breath, dizziness since the bleed.  However, her son feels like she may have been a little bit more sleepy after the bleed on Saturday  She reports some urinary frequency.  Denies any dysuria, hematuria.  No fevers reported.  One of the nurses that checked in on her recommended that she have at least a urinalysis performed today   ROS: Per HPI  Allergies  Allergen Reactions   Statins Other (See Comments)    Myalgia   Past Medical History:  Diagnosis Date   A-fib (HCC)    Dyslipidemia    Hyperlipidemia    Hypothyroidism    2 degree amidarone    Pacemaker    PAF (paroxysmal atrial fibrillation) (HCC)    S/P mitral valve repair    SSS (sick sinus syndrome) (HCC)    Systemic hypertension     Current Outpatient Medications:    apixaban  (ELIQUIS ) 5 MG TABS tablet, Take 1 tablet (5 mg total) by mouth 2 (two) times daily. To replace coumadin , Disp: 60 tablet, Rfl: 12   busPIRone  (BUSPAR ) 5 MG tablet, Take 1 tablet (5 mg total) by mouth  2 (two) times daily. For anxiety/ nerves, Disp: 180 tablet, Rfl: 3   Ferrous Sulfate (IRON) 28 MG TABS, Take 28 mg by mouth daily., Disp: , Rfl:    levothyroxine  (SYNTHROID ) 25 MCG tablet, Take 1 tablet (25 mcg total) by mouth daily., Disp: 90 tablet, Rfl: 3   metoprolol  tartrate (LOPRESSOR ) 50 MG tablet, TAKE 1 TABLET TWICE A DAY (KEEP UPCOMING APPOINTMENT FOR FUTURE REFILLS), Disp: 180 tablet, Rfl: 4   Polyethyl Glycol-Propyl Glycol (SYSTANE OP), Place 1 drop into both eyes as needed (dry eyes)., Disp: , Rfl:    silver  sulfADIAZINE  (SSD) 1 % cream, Apply 1 Application topically daily. To affected areas on breast with each bandage change, Disp: 400 g, Rfl: 0   triamterene -hydrochlorothiazide (DYAZIDE) 37.5-25 MG capsule, Take 1 each (1 capsule total) by mouth daily., Disp: 90 capsule, Rfl: 3 Social History   Socioeconomic History   Marital status: Widowed    Spouse name: Not on file   Number of children: 3   Years of education: Not on file   Highest education level: High school graduate  Occupational History   Occupation: Retired  Tobacco Use   Smoking status: Never   Smokeless tobacco: Never  Vaping Use   Vaping status: Never Used  Substance and Sexual Activity   Alcohol  use: No   Drug use: Never   Sexual activity:  Not Currently  Other Topics Concern   Not on file  Social History Narrative   Husband passed 06/29/2021   2 sons live with her   Social Drivers of Health   Financial Resource Strain: Low Risk  (09/19/2023)   Overall Financial Resource Strain (CARDIA)    Difficulty of Paying Living Expenses: Not hard at all  Food Insecurity: No Food Insecurity (09/19/2023)   Hunger Vital Sign    Worried About Running Out of Food in the Last Year: Never true    Ran Out of Food in the Last Year: Never true  Transportation Needs: No Transportation Needs (09/19/2023)   PRAPARE - Administrator, Civil Service (Medical): No    Lack of Transportation (Non-Medical): No  Physical  Activity: Insufficiently Active (09/19/2023)   Exercise Vital Sign    Days of Exercise per Week: 3 days    Minutes of Exercise per Session: 30 min  Stress: No Stress Concern Present (09/19/2023)   [name]    Feeling of Stress : Not at all  Social Connections: Moderately Isolated (09/19/2023)   Social Connection and Isolation Panel    Frequency of Communication with Friends and Family: More than three times a week    Frequency of Social Gatherings with Friends and Family: More than three times a week    Attends Religious Services: More than 4 times per year    Active Member of [employer] or Organizations: No    Attends Banker Meetings: Never    Marital Status: Widowed  Intimate Partner Violence: Not At Risk (09/19/2023)   Humiliation, Afraid, Rape, and Kick questionnaire    Fear of Current or Ex-Partner: No    Emotionally Abused: No    Physically Abused: No    Sexually Abused: No   Family History  Problem Relation Age of Onset   Heart attack Mother    Heart attack Father    Parkinson's disease Son     Objective: Office vital signs reviewed. BP (!) 111/57   Pulse 60   Temp (!) 97 F (36.1 C)   Wt 135 lb (61.2 kg)   SpO2 100%   BMI 20.53 kg/m   Physical Examination:  General: Awake, alert, chronically ill-appearing elderly female, No acute distress HEENT: Sclera white.  She does have some mild conjunctival pallor Cardio: regular rate   Pulm:  normal work of breathing on room air Breast: Right breast with bandage.  No appreciable blood on the bandage.  The wound is ulcerated and macerated appearing   Assessment/ Plan: 89 y.o. female   Neoplasm of breast with edema or ulceration of breast skin or satellite skin nodule confined to breast (pT4b) (HCC)  Invasive ductal carcinoma of breast, female, right (HCC)  Bleeding from wound - Plan: CBC with Differential, Hemoglobin, fingerstick  Urinary  frequency - Plan: Urinalysis, Complete, Urine Culture   She has been referred appropriately for ongoing treatment of that breast cancer which has been found to be invasive ductal carcinoma.  Her wound is hemostatic currently but of course she has high risk of bleeds given advanced age, thin skin and of course use of Eliquis .  CBC and hemoglobin ordered to evaluate for possible anemia after blood loss  Urinalysis without evidence of infection.  Urine culture sent for completion   Zannah Melucci CHRISTELLA Fielding, DO [facility] [phone]

## 2024-04-12 LAB — CBC WITH DIFFERENTIAL/PLATELET
Basophils Absolute: 0 x10E3/uL (ref 0.0–0.2)
Basos: 1 %
EOS (ABSOLUTE): 0 x10E3/uL (ref 0.0–0.4)
Eos: 1 %
Hematocrit: 32.7 % — ABNORMAL LOW (ref 34.0–46.6)
Hemoglobin: 10.3 g/dL — ABNORMAL LOW (ref 11.1–15.9)
Immature Grans (Abs): 0 x10E3/uL (ref 0.0–0.1)
Immature Granulocytes: 0 %
Lymphocytes Absolute: 1.2 x10E3/uL (ref 0.7–3.1)
Lymphs: 30 %
MCH: 27.9 pg (ref 26.6–33.0)
MCHC: 31.5 g/dL (ref 31.5–35.7)
MCV: 89 fL (ref 79–97)
Monocytes Absolute: 0.3 x10E3/uL (ref 0.1–0.9)
Monocytes: 7 %
Neutrophils Absolute: 2.5 x10E3/uL (ref 1.4–7.0)
Neutrophils: 61 %
Platelets: 185 x10E3/uL (ref 150–450)
RBC: 3.69 x10E6/uL — ABNORMAL LOW (ref 3.77–5.28)
RDW: 15.8 % — ABNORMAL HIGH (ref 11.7–15.4)
WBC: 4 x10E3/uL (ref 3.4–10.8)

## 2024-04-13 LAB — URINE CULTURE

## 2024-04-18 ENCOUNTER — Ambulatory Visit: Payer: Self-pay | Admitting: Cardiovascular Disease

## 2024-04-19 ENCOUNTER — Inpatient Hospital Stay: Payer: MEDICARE | Attending: Hematology and Oncology | Admitting: Hematology and Oncology

## 2024-04-19 VITALS — BP 120/62 | HR 60 | Temp 97.6°F | Resp 17 | Wt 129.2 lb

## 2024-04-19 DIAGNOSIS — Z7901 Long term (current) use of anticoagulants: Secondary | ICD-10-CM | POA: Diagnosis not present

## 2024-04-19 DIAGNOSIS — C50911 Malignant neoplasm of unspecified site of right female breast: Secondary | ICD-10-CM | POA: Diagnosis not present

## 2024-04-19 DIAGNOSIS — Z950 Presence of cardiac pacemaker: Secondary | ICD-10-CM | POA: Insufficient documentation

## 2024-04-19 MED ORDER — ANASTROZOLE 1 MG PO TABS: 1.0000 mg | ORAL_TABLET | Freq: Every day | ORAL | 3 refills | Status: AC

## 2024-04-19 NOTE — Assessment & Plan Note (Addendum)
 Large fungating right breast mass with bleeding: Mammogram 04/20/2023: Right breast: 5.5 cm mass, 2 enlarged lymph nodes in right axilla 03/29/2024: Right breast biopsy: IDC ER 100%, PR 100%, Ki67 15%, HER2 1+  Discussion: Mastectomy versus palliative antiestrogen therapy was discussed in detail. Anastrozole counseling: We discussed the risks and benefits of anti-estrogen therapy with aromatase inhibitors. These include but not limited to insomnia, hot flashes, mood changes, vaginal dryness, bone density loss, and weight gain.   Also recommended staging evaluation for prognosis purposes

## 2024-04-19 NOTE — Progress Notes (Signed)
 [name]  Patient Care Team: Melissa Norene HERO, DO as PCP - General (Family Medicine) Melissa Ozell RAMAN, LCSW as [facility] Care Management (Licensed Clinical Social Worker) [name], Jerel, MD as Consulting Physician (Cardiology) Melissa Vinie BROCKS, MD as Consulting Physician (Cardiology)  CHIEF COMPLAINTS/PURPOSE OF CONSULTATION:  Newly diagnosed breast cancer  HISTORY OF PRESENTING ILLNESS:   History of Present Illness [name] is a 89 year old female with breast cancer who presents for management of her condition. She is accompanied by her son, [name]. She was referred by Dr. Vanderbilt for evaluation and management of her breast cancer.  She has had a breast lump for over a year, which bleeds and causes significant discomfort. A biopsy confirmed breast cancer. She is considering treatment options, including surgery and medication, but is concerned about surgery due to her age and bleeding risk.  She experienced a significant bleeding episode in the past, requiring a blood transfusion. She manages her wound care independently, changing bandages daily, though the cost is a concern. She is on Eliquis , started two weeks ago after discontinuing Coumadin , and has been on anticoagulation therapy for many years without knowing the specific reason.     I reviewed her records extensively and collaborated the history with the patient.  SUMMARY OF ONCOLOGIC HISTORY: Oncology History  Invasive ductal carcinoma of breast, female, right (HCC)  04/11/2024 Initial Diagnosis   Invasive ductal carcinoma of breast, female, right (HCC)   04/19/2024 Cancer Staging   Staging form: Breast, AJCC 8th Edition - Clinical: Stage IIIB (cT4, cN1, cM0, G2, ER+, PR+, HER2-) - Signed by Melissa Potts, MD on 04/19/2024 Stage prefix: Initial diagnosis Histologic grading system: 3 grade system      MEDICAL HISTORY:  Past Medical History:  Diagnosis Date    A-fib (HCC)    Dyslipidemia    Hyperlipidemia    Hypothyroidism    2 degree amidarone    Pacemaker    PAF (paroxysmal atrial fibrillation) (HCC)    S/P mitral valve repair    SSS (sick sinus syndrome) (HCC)    Systemic hypertension     SURGICAL HISTORY: Past Surgical History:  Procedure Laterality Date   ABDOMINAL HYSTERECTOMY  1989   CARDIAC CATHETERIZATION  01/11/2008   mild-mod pulm. hypertension,normal coronaries   MINIMALLY INVASIVE MAZE PROCEDURE  02/2008   MITRAL VALVE REPAIR  September 2009   [facility] ring annuloplasty   NM MYOVIEW  LTD  03/28/2007   no ischemia   PACEMAKER PLACEMENT  November 2008   PPM GENERATOR CHANGEOUT N/A 08/25/2016   Procedure: PPM Generator Changeout - Battery Only;  Surgeon: [name] Balding, MD;  Location: [facility] INVASIVE CV LAB;  Service: Cardiovascular;  Laterality: N/A;   PPM GENERATOR CHANGEOUT N/A 10/06/2023   Procedure: PPM GENERATOR CHANGEOUT;  Surgeon: Balding Jerel, MD;  Location: [facility] INVASIVE CV LAB;  Service: Cardiovascular;  Laterality: N/A;   US  ECHOCARDIOGRAPHY  03/07/2012   mild LVH,EF =>55%,mild MR,mod TR,trace AI,right atrium mod. dilated    SOCIAL HISTORY: Social History   Socioeconomic History   Marital status: Widowed    Spouse name: Not on file   Number of children: 3   Years of education: Not on file   Highest education level: High school graduate  Occupational History   Occupation: Retired  Tobacco Use   Smoking status: Never   Smokeless tobacco: Never  Vaping Use   Vaping status: Never Used  Substance and Sexual Activity   Alcohol  use:  No   Drug use: Never   Sexual activity: Not Currently  Other Topics Concern   Not on file  Social History Narrative   Husband passed 06/29/2021   2 sons live with her   Social Drivers of Health   Financial Resource Strain: Low Risk  (09/19/2023)   Overall Financial Resource Strain (CARDIA)    Difficulty of Paying Living Expenses: Not hard at all  Food Insecurity: No Food  Insecurity (09/19/2023)   Hunger Vital Sign    Worried About Running Out of Food in the Last Year: Never true    Ran Out of Food in the Last Year: Never true  Transportation Needs: No Transportation Needs (09/19/2023)   PRAPARE - Administrator, Civil Service (Medical): No    Lack of Transportation (Non-Medical): No  Physical Activity: Insufficiently Active (09/19/2023)   Exercise Vital Sign    Days of Exercise per Week: 3 days    Minutes of Exercise per Session: 30 min  Stress: No Stress Concern Present (09/19/2023)   [name]    Feeling of Stress : Not at all  Social Connections: Moderately Isolated (09/19/2023)   Social Connection and Isolation Panel    Frequency of Communication with Friends and Family: More than three times a week    Frequency of Social Gatherings with Friends and Family: More than three times a week    Attends Religious Services: More than 4 times per year    Active Member of [employer] or Organizations: No    Attends Banker Meetings: Never    Marital Status: Widowed  Intimate Partner Violence: Not At Risk (09/19/2023)   Humiliation, Afraid, Rape, and Kick questionnaire    Fear of Current or Ex-Partner: No    Emotionally Abused: No    Physically Abused: No    Sexually Abused: No    FAMILY HISTORY: Family History  Problem Relation Age of Onset   Heart attack Mother    Heart attack Father    Parkinson's disease Son     ALLERGIES:  is allergic to statins.  MEDICATIONS:  Current Outpatient Medications  Medication Sig Dispense Refill   apixaban  (ELIQUIS ) 5 MG TABS tablet Take 1 tablet (5 mg total) by mouth 2 (two) times daily. To replace coumadin  60 tablet 12   busPIRone  (BUSPAR ) 5 MG tablet Take 1 tablet (5 mg total) by mouth 2 (two) times daily. For anxiety/ nerves 180 tablet 3   Ferrous Sulfate (IRON) 28 MG TABS Take 28 mg by mouth daily.     levothyroxine  (SYNTHROID )  25 MCG tablet Take 1 tablet (25 mcg total) by mouth daily. 90 tablet 3   metoprolol  tartrate (LOPRESSOR ) 50 MG tablet TAKE 1 TABLET TWICE A DAY (KEEP UPCOMING APPOINTMENT FOR FUTURE REFILLS) 180 tablet 4   Polyethyl Glycol-Propyl Glycol (SYSTANE OP) Place 1 drop into both eyes as needed (dry eyes).     silver  sulfADIAZINE  (SSD) 1 % cream Apply 1 Application topically daily. To affected areas on breast with each bandage change 400 g 0   triamterene -hydrochlorothiazide (DYAZIDE) 37.5-25 MG capsule Take 1 each (1 capsule total) by mouth daily. 90 capsule 3   No current facility-administered medications for this visit.    REVIEW OF SYSTEMS:   Constitutional: Denies fevers, chills or abnormal night sweats Breast: Fungating tumor of the breast All other systems were reviewed with the patient and are negative.  PHYSICAL EXAMINATION: ECOG PERFORMANCE STATUS: 4 - Bedbound  Vitals:   04/19/24 1523  BP: 120/62  Pulse: 60  Resp: 17  Temp: 97.6 F (36.4 C)  SpO2: 100%   Filed Weights   04/19/24 1523  Weight: 129 lb 3.2 oz (58.6 kg)    GENERAL:alert, no distress and comfortable    LABORATORY DATA:  I have reviewed the data as listed Lab Results  Component Value Date   WBC 4.0 04/11/2024   HGB 10.3 (L) 04/11/2024   HCT 32.7 (L) 04/11/2024   MCV 89 04/11/2024   PLT 185 04/11/2024   Lab Results  Component Value Date   NA 140 10/03/2023   K 4.6 10/03/2023   CL 100 10/03/2023   CO2 25 10/03/2023    RADIOGRAPHIC STUDIES: I have personally reviewed the radiological reports and agreed with the findings in the report.  ASSESSMENT AND PLAN:  Invasive ductal carcinoma of breast, female, right (HCC) Large fungating right breast mass with bleeding: Mammogram 04/20/2023: Right breast: 5.5 cm mass, 2 enlarged lymph nodes in right axilla 03/29/2024: Right breast biopsy: IDC ER 100%, PR 100%, Ki67 15%, HER2 1+  Discussion: Mastectomy versus palliative antiestrogen therapy was discussed  in detail.  (Patient decided to do antiestrogen therapy and not do surgery) Anastrozole counseling: We discussed the risks and benefits of anti-estrogen therapy with aromatase inhibitors. These include but not limited to insomnia, hot flashes, mood changes, vaginal dryness, bone density loss, and weight gain.   We did not recommend staging studies because it would not change her treatment  Telephone visit in 3 months to discuss tolerance to anastrozole   Assessment & Plan Malignant neoplasm of right breast with associated tumor bleeding Malignant neoplasm of the right breast with bleeding episodes. Opted for anastrozole due to advanced age and surgical concerns. Anastrozole chosen for its ability to reduce estrogen and slow tumor growth. Discussed potential side effects and lack of drug interactions. - Prescribe anastrozole 1 mg once daily. - Provide dressing supplies for home use.  Anticoagulation therapy (Eliquis ) for cardiac indication On Eliquis  for cardiac indication, increasing bleeding risk with breast tumor. - Consult with cardiologist regarding continuation of Eliquis .     All questions were answered. The patient knows to call the clinic with any problems, questions or concerns. I personally spent a total of 30 minutes in the care of the patient today including preparing to see the patient, getting/reviewing separately obtained history, performing a medically appropriate exam/evaluation, counseling and educating, placing orders, referring and communicating with other health care professionals, documenting clinical information in the EHR, independently interpreting results, communicating results, and coordinating care.   Viinay K Amairani Shuey, MD 04/19/24

## 2024-04-20 ENCOUNTER — Ambulatory Visit: Payer: MEDICARE | Attending: Cardiovascular Disease | Admitting: Cardiovascular Disease

## 2024-04-23 ENCOUNTER — Encounter: Payer: Self-pay | Admitting: Cardiovascular Disease

## 2024-04-24 ENCOUNTER — Encounter: Payer: Self-pay | Admitting: *Deleted

## 2024-04-25 ENCOUNTER — Other Ambulatory Visit: Payer: Self-pay | Admitting: Family Medicine

## 2024-04-25 ENCOUNTER — Telehealth: Payer: Self-pay

## 2024-04-25 DIAGNOSIS — I480 Paroxysmal atrial fibrillation: Secondary | ICD-10-CM

## 2024-04-25 NOTE — Telephone Encounter (Signed)
 PER CRM- Pt does not have insurance coverage and need coupons to cover expense for apixaban  (ELIQUIS ) 5 MG TABS tablet

## 2024-04-25 NOTE — Telephone Encounter (Unsigned)
 Copied from CRM [id number]. Topic: Clinical - Medication Refill >> Apr 25, 2024 11:39 AM Zebedee SAUNDERS wrote: Medication: apixaban  (ELIQUIS ) 5 MG TABS tablet  Has the patient contacted their pharmacy? Yes (Agent: If no, request that the patient contact the pharmacy for the refill. If patient does not wish to contact the pharmacy document the reason why and proceed with request.) (Agent: If yes, when and what did the pharmacy advise?)  This is the patient's preferred pharmacy:  [facility] [place], [place] - [address] [place] [address] Phone: [phone] Fax: [phone]  Is this the correct pharmacy for this prescription? Yes If no, delete pharmacy and type the correct one.   Has the prescription been filled recently? Yes  Is the patient out of the medication? Yes  Has the patient been seen for an appointment in the last year OR does the patient have an upcoming appointment? Yes  Can we respond through MyChart? Yes  Agent: Please be advised that Rx refills may take up to 3 business days. We ask that you follow-up with your pharmacy.

## 2024-04-25 NOTE — Telephone Encounter (Signed)
Please call pt and follow up

## 2024-04-25 NOTE — Telephone Encounter (Unsigned)
 Copied from CRM [id number]. Topic: Clinical - Prescription Issue >> Apr 24, 2024  5:35 PM Delon DASEN wrote: Reason for CRM: Patient lost prescription coverage and is asking for assistance- please call son [name] [phone]

## 2024-05-01 NOTE — Telephone Encounter (Unsigned)
 Coupon given to pharmacy and patient

## 2024-05-02 NOTE — Telephone Encounter (Signed)
 Called patient, no answer, no option to leave message

## 2024-05-02 NOTE — Telephone Encounter (Signed)
 Because she has a Medicare plan she cannot use the coupons that are available for Eliquis .  If she is having difficulty affording it because her insurance has a high deductible then sadly there is no way around this.  She either #1 needs to go back on Coumadin  or #2 is going to have to continue paying for the Eliquis  as is.  I will CC Mliss to see if this patient might be eligible for one of the patient assistance programs but those have historically been very difficult to be approved for and may be going away in 2026

## 2024-05-03 NOTE — Telephone Encounter (Signed)
 Please call patient again and see if she wants to go on Coumadin  or do the Eliquis .

## 2024-05-31 NOTE — Telephone Encounter (Signed)
 Attempted to contact pt and just keeps ringing, no VM.

## 2024-06-04 ENCOUNTER — Telehealth: Payer: Self-pay | Admitting: Family Medicine

## 2024-06-04 NOTE — Telephone Encounter (Unsigned)
 Copied from CRM [id number]. Topic: Clinical - Medication Question >> Jun 04, 2024 11:42 AM Debby BROCKS wrote: Reason for CRM: Patients son Bethania Schlotzhauer) called to see if the practice has samples or anything that could help them obtain apixaban  (ELIQUIS ) 5 MG TABS tablet for the month as the price is too expensive for them. They have a new plan coming in January but the patient has run out for now and would need until the new plan comes into effect  [phone]

## 2024-06-04 NOTE — Telephone Encounter (Signed)
 John aware we do not have samples right now, advised to call cardiology.

## 2024-06-13 ENCOUNTER — Ambulatory Visit: Payer: MEDICARE | Admitting: Family Medicine

## 2024-06-13 NOTE — Progress Notes (Deleted)
 Subjective: CC:*** PCP: [name], DO YEP:[name] is a 89 y.o. female presenting to clinic today for:  Started on Anastrozole  since our last OV for breast cancer. ***   ROS: Per HPI  Allergies[1] Past Medical History:  Diagnosis Date   A-fib (HCC)    Dyslipidemia    Hyperlipidemia    Hypothyroidism    2 degree amidarone    Pacemaker    PAF (paroxysmal atrial fibrillation) (HCC)    S/P mitral valve repair    SSS (sick sinus syndrome) (HCC)    Systemic hypertension    Current Medications[2] Social History   Socioeconomic History   Marital status: Widowed    Spouse name: Not on file   Number of children: 3   Years of education: Not on file   Highest education level: High school graduate  Occupational History   Occupation: Retired  Tobacco Use   Smoking status: Never   Smokeless tobacco: Never  Vaping Use   Vaping status: Never Used  Substance and Sexual Activity   Alcohol  use: No   Drug use: Never   Sexual activity: Not Currently  Other Topics Concern   Not on file  Social History Narrative   Husband passed 06/29/2021   2 sons live with her   Social Drivers of Health   Tobacco Use: Low Risk (04/11/2024)   Patient History    Smoking Tobacco Use: Never    Smokeless Tobacco Use: Never    Passive Exposure: Not on file  Financial Resource Strain: Low Risk (09/19/2023)   Overall Financial Resource Strain (CARDIA)    Difficulty of Paying Living Expenses: Not hard at all  Food Insecurity: No Food Insecurity (09/19/2023)   Hunger Vital Sign    Worried About Running Out of Food in the Last Year: Never true    Ran Out of Food in the Last Year: Never true  Transportation Needs: No Transportation Needs (09/19/2023)   PRAPARE - Administrator, Civil Service (Medical): No    Lack of Transportation (Non-Medical): No  Physical Activity: Insufficiently Active (09/19/2023)   Exercise Vital Sign    Days of Exercise per Week: 3 days    Minutes  of Exercise per Session: 30 min  Stress: No Stress Concern Present (09/19/2023)   [name]    Feeling of Stress : Not at all  Social Connections: Moderately Isolated (09/19/2023)   Social Connection and Isolation Panel    Frequency of Communication with Friends and Family: More than three times a week    Frequency of Social Gatherings with Friends and Family: More than three times a week    Attends Religious Services: More than 4 times per year    Active Member of [employer] or Organizations: No    Attends Banker Meetings: Never    Marital Status: Widowed  Intimate Partner Violence: Not At Risk (09/19/2023)   Humiliation, Afraid, Rape, and Kick questionnaire    Fear of Current or Ex-Partner: No    Emotionally Abused: No    Physically Abused: No    Sexually Abused: No  Depression (PHQ2-9): Low Risk (04/11/2024)   Depression (PHQ2-9)    PHQ-2 Score: 0  Alcohol  Screen: Low Risk (09/19/2023)   Alcohol  Screen    Last Alcohol  Screening Score (AUDIT): 0  Housing: Unknown (03/29/2024)   Received from [facility]   Epic    Unable to Pay for Housing in the  Last Year: Not on file    Number of Times Moved in the Last Year: Not on file    At any time in the past 12 months, were you homeless or living in a shelter (including now)?: No  Utilities: Not At Risk (09/19/2023)   AHC Utilities    Threatened with loss of utilities: No  Health Literacy: Adequate Health Literacy (09/19/2023)   B1300 Health Literacy    Frequency of need for help with medical instructions: Never   Family History  Problem Relation Age of Onset   Heart attack Mother    Heart attack Father    Parkinson's disease Son     Objective: Office vital signs reviewed. There were no vitals taken for this visit.  Physical Examination:  General: Awake, alert, *** nourished, No acute distress HEENT: Normal    Neck: No masses palpated. No  lymphadenopathy    Ears: Tympanic membranes intact, normal light reflex, no erythema, no bulging    Eyes: PERRLA, extraocular membranes intact, sclera ***    Nose: nasal turbinates moist, *** nasal discharge    Throat: moist mucus membranes, no erythema, *** tonsillar exudate.  Airway is patent Cardio: regular rate and rhythm, S1S2 heard, no murmurs appreciated Pulm: clear to auscultation bilaterally, no wheezes, rhonchi or rales; normal work of breathing on room air GI: soft, non-tender, non-distended, bowel sounds present x4, no hepatomegaly, no splenomegaly, no masses GU: external vaginal tissue ***, cervix ***, *** punctate lesions on cervix appreciated, *** discharge from cervical os, *** bleeding, *** cervical motion tenderness, *** abdominal/ adnexal masses Extremities: warm, well perfused, No edema, cyanosis or clubbing; +*** pulses bilaterally MSK: *** gait and *** station Skin: dry; intact; no rashes or lesions Neuro: *** Strength and light touch sensation grossly intact, *** DTRs ***/4  Assessment/ Plan: 89 y.o. female   Neoplasm of breast with edema or ulceration of breast skin or satellite skin nodule confined to breast (pT4b) (HCC)   ***   Devrin Monforte CHRISTELLA Fielding, DO [facility] [phone]     [1]  Allergies Allergen Reactions   Statins Other (See Comments)    Myalgia  [2]  Current Outpatient Medications:    anastrozole  (ARIMIDEX ) 1 MG tablet, Take 1 tablet (1 mg total) by mouth daily., Disp: 90 tablet, Rfl: 3   apixaban  (ELIQUIS ) 5 MG TABS tablet, Take 1 tablet (5 mg total) by mouth 2 (two) times daily. To replace coumadin , Disp: 60 tablet, Rfl: 12   busPIRone  (BUSPAR ) 5 MG tablet, Take 1 tablet (5 mg total) by mouth 2 (two) times daily. For anxiety/ nerves, Disp: 180 tablet, Rfl: 3   Ferrous Sulfate (IRON) 28 MG TABS, Take 28 mg by mouth daily., Disp: , Rfl:    levothyroxine  (SYNTHROID ) 25 MCG tablet, Take 1 tablet (25 mcg total) by mouth  daily., Disp: 90 tablet, Rfl: 3   metoprolol  tartrate (LOPRESSOR ) 50 MG tablet, TAKE 1 TABLET TWICE A DAY (KEEP UPCOMING APPOINTMENT FOR FUTURE REFILLS), Disp: 180 tablet, Rfl: 4   Polyethyl Glycol-Propyl Glycol (SYSTANE OP), Place 1 drop into both eyes as needed (dry eyes)., Disp: , Rfl:    silver  sulfADIAZINE  (SSD) 1 % cream, Apply 1 Application topically daily. To affected areas on breast with each bandage change, Disp: 400 g, Rfl: 0   triamterene -hydrochlorothiazide (DYAZIDE) 37.5-25 MG capsule, Take 1 each (1 capsule total) by mouth daily., Disp: 90 capsule, Rfl: 3

## 2024-06-14 ENCOUNTER — Encounter: Payer: Self-pay | Admitting: Family Medicine

## 2024-06-22 ENCOUNTER — Ambulatory Visit: Payer: MEDICARE | Admitting: Family Medicine

## 2024-07-05 ENCOUNTER — Encounter: Payer: Self-pay | Admitting: Cardiovascular Disease

## 2024-07-05 ENCOUNTER — Ambulatory Visit: Payer: MEDICARE | Attending: Cardiovascular Disease | Admitting: Cardiovascular Disease

## 2024-07-05 VITALS — BP 130/80 | HR 55 | Ht 68.0 in | Wt 132.8 lb

## 2024-07-05 DIAGNOSIS — I495 Sick sinus syndrome: Secondary | ICD-10-CM | POA: Diagnosis not present

## 2024-07-05 DIAGNOSIS — Z79899 Other long term (current) drug therapy: Secondary | ICD-10-CM | POA: Insufficient documentation

## 2024-07-05 DIAGNOSIS — I10 Essential (primary) hypertension: Secondary | ICD-10-CM | POA: Insufficient documentation

## 2024-07-05 DIAGNOSIS — Z950 Presence of cardiac pacemaker: Secondary | ICD-10-CM | POA: Insufficient documentation

## 2024-07-05 DIAGNOSIS — I872 Venous insufficiency (chronic) (peripheral): Secondary | ICD-10-CM | POA: Diagnosis present

## 2024-07-05 DIAGNOSIS — D6869 Other thrombophilia: Secondary | ICD-10-CM | POA: Diagnosis present

## 2024-07-05 DIAGNOSIS — Z9889 Other specified postprocedural states: Secondary | ICD-10-CM | POA: Insufficient documentation

## 2024-07-05 DIAGNOSIS — C50911 Malignant neoplasm of unspecified site of right female breast: Secondary | ICD-10-CM | POA: Insufficient documentation

## 2024-07-05 DIAGNOSIS — I480 Paroxysmal atrial fibrillation: Secondary | ICD-10-CM | POA: Diagnosis present

## 2024-07-05 NOTE — Progress Notes (Unsigned)
 "   Cardiology office note   Date:  07/05/2024   ID:  [name], DOB Apr 14, 1929, MRN [id number] PCP:  [name], DO  Cardiologist:  Harryette Shuart Electrophysiologist:  None   Evaluation Performed:  Follow-Up Visit  Chief Complaint:  Afib, pacemaker follow-up  History of Present Illness:    [name] is a 89 y.o. female with remote history of mitral valve annuloplasty and surgical ablation, sinus node dysfunction and paroxysmal atrial fibrillation with a dual-chamber permanent pacemaker (Medtronic 2008, original 5076 leads, Azure generator change out 2018 and again in April 2025).  She is accompanied by her son.  The pacemaker site has healed very well.  Unfortunately she continues to have problems with under sensing on the atrial lead (this is a problem both with bipolar and unipolar sensing programming; already programmed at lower limit of sensitivity 0.15 mV).  She is not pacemaker dependent and has mostly atrial sensed-ventricular sensed (sinus rhythm), occasionally with ventricular pacing for Wenckebach cycles.  Overall has 37% atrial pacing and only 27% ventricular pacing with decent heart rate histogram distribution.  Atrial pacing threshold remains acceptable.  Ventricular lead parameters are good with sensed R waves around 10 mV and capture threshold 0.75 V at 0.4 ms.  The burden of atrial fibrillation remains quite low at about 1.9%, mostly made up by 24-hour event that occurred in June.  Device programmed DDIR with a lower rate limit of 45 bpm.    She does not have any cardiovascular complaints and has not had any falls, dizziness or syncopal events.  She has had problems with a swollen and warm left calf and has been started on antibiotics.  She has not had fever or chills.  The leg is clearly asymmetrically swollen.  We did a lower extremity duplex venous ultrasound today for DVT which was negative.  Her INR most recently was 3.4 (followed at [facility] family  practice).  She continues to be very secretive about the large and ulcerated mass on her right breast.  She tells me that she is doing her own dressing changes. The diagnosis of breast cancer is virtually certain based on the appearance on mammography and the presence of enlarged right axillary lymph nodes.  There was a plan for biopsy but this has not been performed.  She reports that it has not been bleeding or draining.  She really does not want me to look at it or talk about it.  She has hypertension. She has a history of mitral valve annuloplasty and Cox Maze cryoablation procedure via minithoracotomy in 2009, has preserved left ventricular systolic function, no residual mitral insufficiency, normal coronary arteries at preoperative angiogram.   Past Medical History:  Diagnosis Date   A-fib (HCC)    Dyslipidemia    Hyperlipidemia    Hypothyroidism    2 degree amidarone    Pacemaker    PAF (paroxysmal atrial fibrillation) (HCC)    S/P mitral valve repair    SSS (sick sinus syndrome) (HCC)    Systemic hypertension    Past Surgical History:  Procedure Laterality Date   ABDOMINAL HYSTERECTOMY  1989   CARDIAC CATHETERIZATION  01/11/2008   mild-mod pulm. hypertension,normal coronaries   MINIMALLY INVASIVE MAZE PROCEDURE  02/2008   MITRAL VALVE REPAIR  September 2009   [facility] ring annuloplasty   NM MYOVIEW  LTD  03/28/2007   no ischemia   PACEMAKER PLACEMENT  November 2008   PPM GENERATOR CHANGEOUT N/A 08/25/2016   Procedure: PPM  Musician - Battery Only;  Surgeon: Jerel Balding, MD;  Location: [facility] INVASIVE CV LAB;  Service: Cardiovascular;  Laterality: N/A;   PPM GENERATOR CHANGEOUT N/A 10/06/2023   Procedure: PPM GENERATOR CHANGEOUT;  Surgeon: Balding Jerel, MD;  Location: [facility] INVASIVE CV LAB;  Service: Cardiovascular;  Laterality: N/A;   US  ECHOCARDIOGRAPHY  03/07/2012   mild LVH,EF =>55%,mild MR,mod TR,trace AI,right atrium mod. dilated     Current Meds   Medication Sig   anastrozole  (ARIMIDEX ) 1 MG tablet Take 1 tablet (1 mg total) by mouth daily.   apixaban  (ELIQUIS ) 5 MG TABS tablet Take 1 tablet (5 mg total) by mouth 2 (two) times daily. To replace coumadin    busPIRone  (BUSPAR ) 5 MG tablet Take 1 tablet (5 mg total) by mouth 2 (two) times daily. For anxiety/ nerves   Ferrous Sulfate (IRON) 28 MG TABS Take 28 mg by mouth daily.   levothyroxine  (SYNTHROID ) 25 MCG tablet Take 1 tablet (25 mcg total) by mouth daily.   metoprolol  tartrate (LOPRESSOR ) 50 MG tablet TAKE 1 TABLET TWICE A DAY (KEEP UPCOMING APPOINTMENT FOR FUTURE REFILLS)   Polyethyl Glycol-Propyl Glycol (SYSTANE OP) Place 1 drop into both eyes as needed (dry eyes).   silver  sulfADIAZINE  (SSD) 1 % cream Apply 1 Application topically daily. To affected areas on breast with each bandage change   triamterene -hydrochlorothiazide (DYAZIDE) 37.5-25 MG capsule Take 1 each (1 capsule total) by mouth daily.     Allergies:   Statins   Social History   Tobacco Use   Smoking status: Never   Smokeless tobacco: Never  Vaping Use   Vaping status: Never Used  Substance Use Topics   Alcohol  use: No   Drug use: Never     Family Hx: The patient's family history includes Heart attack in her father and mother; Parkinson's disease in her son.  ROS:   Please see the history of present illness.    All other systems reviewed and are negative.   Prior CV studies:   The following studies were reviewed today: Comprehensive pacemaker check today performed by me  Labs/Other Tests and Data Reviewed:    EKG: .  EKG Interpretation Date/Time:    Ventricular Rate:    PR Interval:    QRS Duration:    QT Interval:    QTC Calculation:   R Axis:      Text Interpretation:           Recent Labs: 10/03/2023: BUN 18; Creatinine, Ser 1.13; Potassium 4.6; Sodium 140 01/16/2024: TSH 1.950 04/11/2024: Hemoglobin 10.3; Platelets 185   Recent Lipid Panel Lab Results  Component Value  Date/Time   CHOL 223 (H) 05/21/2016 09:54 AM   TRIG 120 05/21/2016 09:54 AM   HDL 52 05/21/2016 09:54 AM   CHOLHDL 4.3 05/21/2016 09:54 AM   CHOLHDL 4.8 12/26/2012 10:57 AM   LDLCALC 147 (H) 05/21/2016 09:54 AM    Wt Readings from Last 3 Encounters:  07/05/24 132 lb 12.8 oz (60.2 kg)  04/19/24 129 lb 3.2 oz (58.6 kg)  04/11/24 135 lb (61.2 kg)     Objective:    Vital Signs:  BP 130/80 (BP Location: Left Arm, Patient Position: Sitting, Cuff Size: Normal)   Pulse (!) 55   Ht 5' 8 (1.727 m)   Wt 132 lb 12.8 oz (60.2 kg)   SpO2 98%   BMI 20.19 kg/m      General: Alert, oriented x3, no distress, healthy left subclavian pacemaker site. Head: no evidence of trauma,  PERRL, EOMI, no exophtalmos or lid lag, no myxedema, no xanthelasma; normal ears, nose and oropharynx Neck: normal jugular venous pulsations and no hepatojugular reflux; brisk carotid pulses without delay and no carotid bruits Chest: clear to auscultation, no signs of consolidation by percussion or palpation, normal fremitus, symmetrical and full respiratory excursions.  Dressing over the right breast which she does not want me to remove. Cardiovascular: normal position and quality of the apical impulse, regular rhythm, normal first and second heart sounds, no murmurs, rubs or gallops Abdomen: no tenderness or distention, no masses by palpation, no abnormal pulsatility or arterial bruits, normal bowel sounds, no hepatosplenomegaly Extremities: 2+ ankle and pedal edema on the right, 3+ swelling and erythema of the entire left calf. Neurological: grossly nonfocal Psych: Normal mood and affect    ASSESSMENT & PLAN:    No diagnosis found.     AFib: The burden of atrial fibrillation is low at about 2%.  The episodes are asymptomatic and well rate controlled.  Antiarrhythmics are not justified.  CHADSVasc 4 (age 72, HTN, gender).  Although she has a history of mitral valve repair, she did not have rheumatic valvular heart  disease.  Warfarin: Most recent INR was 3.4.  She denies bleeding problems.  May need a sooner recheck on her INR since she is getting antibiotics.  She had an office visit with Dr. Jolinda on August 4. Tachy-bradycardia sd: She is not pacemaker dependent. PM: Recent generator change.  Although she had a malfunctioning atrial lead we left it unrevised due to their age and comorbid conditions, including breast cancer that has spread at least to the lymph nodes. HTN: Well-controlled Hx MV repair: Asymptomatic, without any systolic or diastolic murmurs on exam.  Conservative management.  She has not had symptoms of heart failure other than mild pedal edema.  Has not had an echocardiogram since September 2013.  If there will be a plan for major surgery, would recommend repeating an echocardiogram. Peripheral venous insufficiency: Keep legs elevated and continues in compression stockings.  The diagnosis of left leg cellulitis appears accurate, there is no evidence of DVT on ultrasound today.  She is receiving antibiotics. Right breast invasive ductal carcinoma: Findings on mammogram and ultrasound confirm the impression of breast malignancy with metastasis to the axillary lymph nodes.  She has not had a biopsy.  She prefers not to talk about it.  There are no Patient Instructions on file for this visit.  Signed, Jerel Balding, MD  07/05/2024 10:11 AM    [facility] "

## 2024-07-05 NOTE — Patient Instructions (Addendum)
 Medication Instructions:  Your physician has recommended you make the following change in your medication:  DECREASE: Eliquis  2.5 mg (half tablet) twice daily  *If you need a refill on your cardiac medications before your next appointment, please call your pharmacy*  Lab Work: CBC, BMET If you have labs (blood work) drawn today and your tests are completely normal, you will receive your results only by: MyChart Message (if you have MyChart) OR A paper copy in the mail If you have any lab test that is abnormal or we need to change your treatment, we will call you to review the results.    Follow-Up: At [facility], you and your health needs are our priority.  As part of our continuing mission to provide you with exceptional heart care, our providers are all part of one team.  This team includes your primary Cardiologist (physician) and Advanced Practice Providers or APPs (Physician Assistants and Nurse Practitioners) who all work together to provide you with the care you need, when you need it.  Your next appointment:   6 month(s)  Provider:   Jerel Balding, MD

## 2024-07-06 ENCOUNTER — Ambulatory Visit: Payer: MEDICARE

## 2024-07-06 ENCOUNTER — Ambulatory Visit: Payer: Self-pay | Admitting: Cardiovascular Disease

## 2024-07-06 DIAGNOSIS — I480 Paroxysmal atrial fibrillation: Secondary | ICD-10-CM | POA: Diagnosis not present

## 2024-07-06 DIAGNOSIS — D72819 Decreased white blood cell count, unspecified: Secondary | ICD-10-CM

## 2024-07-06 LAB — CBC
Hematocrit: 32.2 % — ABNORMAL LOW (ref 34.0–46.6)
Hemoglobin: 10.4 g/dL — ABNORMAL LOW (ref 11.1–15.9)
MCH: 29.1 pg (ref 26.6–33.0)
MCHC: 32.3 g/dL (ref 31.5–35.7)
MCV: 90 fL (ref 79–97)
Platelets: 156 x10E3/uL (ref 150–450)
RBC: 3.58 x10E6/uL — ABNORMAL LOW (ref 3.77–5.28)
RDW: 15.7 % — ABNORMAL HIGH (ref 11.7–15.4)
WBC: 2.5 x10E3/uL — CL (ref 3.4–10.8)

## 2024-07-06 LAB — BASIC METABOLIC PANEL WITH GFR
BUN/Creatinine Ratio: 26 (ref 12–28)
BUN: 25 mg/dL (ref 10–36)
CO2: 26 mmol/L (ref 20–29)
Calcium: 10 mg/dL (ref 8.7–10.3)
Chloride: 104 mmol/L (ref 96–106)
Creatinine, Ser: 0.98 mg/dL (ref 0.57–1.00)
Glucose: 77 mg/dL (ref 70–99)
Potassium: 3.9 mmol/L (ref 3.5–5.2)
Sodium: 142 mmol/L (ref 134–144)
eGFR: 54 mL/min/1.73 — ABNORMAL LOW

## 2024-07-08 LAB — CUP PACEART REMOTE DEVICE CHECK
Battery Remaining Longevity: 97 mo
Battery Voltage: 3.07 V
Brady Statistic AP VP Percent: 63.03 %
Brady Statistic AP VS Percent: 6.42 %
Brady Statistic AS VP Percent: 4.27 %
Brady Statistic AS VS Percent: 26.28 %
Brady Statistic RA Percent Paced: 74.41 %
Brady Statistic RV Percent Paced: 67.3 %
Date Time Interrogation Session: 20260109034750
Implantable Lead Connection Status: 753985
Implantable Lead Connection Status: 753985
Implantable Lead Implant Date: 20081126
Implantable Lead Implant Date: 20081126
Implantable Lead Location: 753859
Implantable Lead Location: 753860
Implantable Lead Model: 5076
Implantable Lead Model: 5076
Implantable Pulse Generator Implant Date: 20250410
Lead Channel Impedance Value: 323 Ohm
Lead Channel Impedance Value: 361 Ohm
Lead Channel Impedance Value: 361 Ohm
Lead Channel Impedance Value: 399 Ohm
Lead Channel Pacing Threshold Amplitude: 1.625 V
Lead Channel Pacing Threshold Amplitude: 2.375 V
Lead Channel Pacing Threshold Pulse Width: 0.4 ms
Lead Channel Pacing Threshold Pulse Width: 0.4 ms
Lead Channel Sensing Intrinsic Amplitude: 0.125 mV
Lead Channel Sensing Intrinsic Amplitude: 0.125 mV
Lead Channel Sensing Intrinsic Amplitude: 6.25 mV
Lead Channel Sensing Intrinsic Amplitude: 6.25 mV
Lead Channel Setting Pacing Amplitude: 3.25 V
Lead Channel Setting Pacing Amplitude: 3.5 V
Lead Channel Setting Pacing Pulse Width: 0.4 ms
Lead Channel Setting Sensing Sensitivity: 1.2 mV
Zone Setting Status: 755011
Zone Setting Status: 755011

## 2024-07-10 ENCOUNTER — Ambulatory Visit: Payer: Self-pay | Admitting: Cardiovascular Disease

## 2024-07-10 NOTE — Progress Notes (Signed)
 Remote PPM Transmission

## 2024-07-12 ENCOUNTER — Ambulatory Visit: Payer: Self-pay

## 2024-07-12 NOTE — Telephone Encounter (Signed)
 FYI Only or Action Required?: Action required by provider: request for appointment.  Patient was last seen in primary care on 04/11/2024 by [name], DO.  Called Nurse Triage reporting Altered Mental Status.  Symptoms began several days ago.  Interventions attempted: Nothing.  Symptoms are: unchanged. Son reports pt. Has had confusion x 3 days. Has forgotten how to take her medicines, has mixed medicines in different bottles. Pt. Knows who family members are.  Triage Disposition: See HCP Within 4 Hours (Or PCP Triage)  Patient/caregiver understands and will follow disposition?: Yes       Copied from CRM #[id number]. Topic: Clinical - Red Word Triage >> Jul 12, 2024 10:06 AM Cherylann RAMAN wrote: Red Word that prompted transfer to Nurse Triage: [name], son, called and states that the patient is confused about everything and how to take her medicines. He states that it has been getting worse over the course of three days. He states states that patient is really good at hiding it. She has forgotton how to use her cell and home phone such as how to call or hang up the phone. He reports that it is getting worse as the days goes on. He reports she looks bad as well. [name] is very concerned about her medication at that she may be taking too many of her medication. He is requesting a list medications and her dosage per day. Reason for Disposition  [1] Acting confused (e.g., disoriented, slurred speech) AND [2] brief (now gone)  Answer Assessment - Initial Assessment Questions 1. LEVEL OF CONSCIOUSNESS: How are they (the patient) acting right now? (e.g., alert-oriented, confused, lethargic, stuporous, comatose)     alert 2. ONSET: When did the confusion start?  (e.g., minutes, hours, days)     3 days 3. PATTERN: Does this come and go, or has it been constant since it started?  Is it present now?     Worse at night 4. ALCOHOL  or DRUGS: Have they been drinking alcohol  or taking any  drugs?      no 5. NARCOTIC MEDICINES: Have they been receiving any narcotic medications? (e.g., morphine, Vicodin)     no 6. CAUSE: What do you think is causing the confusion?      unsure 7. OTHER SYMPTOMS: Are there any other symptoms? (e.g., difficulty breathing, fever, headache, weakness)     no  Protocols used: Confusion - Delirium-A-AH

## 2024-07-12 NOTE — Telephone Encounter (Signed)
Noted  -LS

## 2024-07-13 ENCOUNTER — Ambulatory Visit (INDEPENDENT_AMBULATORY_CARE_PROVIDER_SITE_OTHER): Payer: MEDICARE | Admitting: Family Medicine

## 2024-07-13 ENCOUNTER — Encounter: Payer: Self-pay | Admitting: Family Medicine

## 2024-07-13 VITALS — BP 137/77 | HR 58 | Temp 97.0°F | Ht 68.0 in | Wt 129.0 lb

## 2024-07-13 DIAGNOSIS — R410 Disorientation, unspecified: Secondary | ICD-10-CM

## 2024-07-13 LAB — URINALYSIS, COMPLETE
Glucose, UA: NEGATIVE
Leukocytes,UA: NEGATIVE
Nitrite, UA: NEGATIVE
Protein,UA: NEGATIVE
RBC, UA: NEGATIVE
Specific Gravity, UA: 1.02 (ref 1.005–1.030)
Urobilinogen, Ur: 0.2 mg/dL (ref 0.2–1.0)
pH, UA: 5.5 (ref 5.0–7.5)

## 2024-07-13 NOTE — Progress Notes (Signed)
 "  BP 137/77   Pulse (!) 58   Temp (!) 97 F (36.1 C)   Ht 5' 8 (1.727 m)   Wt 129 lb (58.5 kg)   SpO2 100%   BMI 19.61 kg/m    Subjective:   Patient ID: [name], female    DOB: 1928/10/27, 89 y.o.   MRN: [id number]  HPI: [name] is a 89 y.o. female presenting on 07/13/2024 for Altered Mental Status   Discussed the use of AI scribe software for clinical note transcription with the patient, who gave verbal consent to proceed.  History of Present Illness   [name] is a 89 year old female who presents with confusion regarding her medication regimen.  Medication management confusion - Ongoing confusion regarding timing and dosage of medications, particularly since switching from Coumadin  to Eliquis . - Currently takes Eliquis  as a half pill in the morning and evening. - Also takes metoprolol  twice daily and triamterene  hydrochlorothiazide, which are sometimes mixed up due to similar appearance. - Change in pharmacy from [facility] to [facility], required by insurance, has led to different brands and pill appearances, increasing confusion. - Uses a pill dispenser labeled with days of the week to organize medications, but continues to experience confusion, especially when pills look similar. - Concerned about potentially doubling up on doses due to mixing pills in the same bottle.  Urinary symptoms - No burning, pain, or blood in the urine.          Relevant past medical, surgical, family and social history reviewed and updated as indicated. Interim medical history since our last visit reviewed. Allergies and medications reviewed and updated.  Review of Systems  Constitutional:  Negative for chills and fever.  Eyes:  Negative for visual disturbance.  Respiratory:  Negative for chest tightness and shortness of breath.   Cardiovascular:  Negative for chest pain and leg swelling.  Genitourinary:  Negative for difficulty urinating, dysuria and frequency.   Musculoskeletal:  Negative for back pain and gait problem.  Skin:  Negative for rash.  Neurological:  Negative for dizziness, light-headedness and headaches.  Psychiatric/Behavioral:  Positive for confusion. Negative for agitation and behavioral problems.   All other systems reviewed and are negative.   Per HPI unless specifically indicated above   Allergies as of 07/13/2024       Reactions   Statins Other (See Comments)   Myalgia        Medication List        Accurate as of July 13, 2024  3:32 PM. If you have any questions, ask your nurse or doctor.          STOP taking these medications    Iron 28 MG Tabs Stopped by: Melissa Levins, MD       TAKE these medications    anastrozole  1 MG tablet Commonly known as: ARIMIDEX  Take 1 tablet (1 mg total) by mouth daily.   apixaban  5 MG Tabs tablet Commonly known as: ELIQUIS  Take 1 tablet (5 mg total) by mouth 2 (two) times daily. To replace coumadin    busPIRone  5 MG tablet Commonly known as: BUSPAR  Take 1 tablet (5 mg total) by mouth 2 (two) times daily. For anxiety/ nerves   levothyroxine  25 MCG tablet Commonly known as: SYNTHROID  Take 1 tablet (25 mcg total) by mouth daily.   metoprolol  tartrate 50 MG tablet Commonly known as: LOPRESSOR  TAKE 1 TABLET TWICE A DAY (KEEP UPCOMING APPOINTMENT FOR FUTURE REFILLS)   silver  sulfADIAZINE   1 % cream Commonly known as: SSD Apply 1 Application topically daily. To affected areas on breast with each bandage change   SYSTANE OP Place 1 drop into both eyes as needed (dry eyes).   triamterene -hydrochlorothiazide 37.5-25 MG capsule Commonly known as: DYAZIDE Take 1 each (1 capsule total) by mouth daily.         Objective:   BP 137/77   Pulse (!) 58   Temp (!) 97 F (36.1 C)   Ht 5' 8 (1.727 m)   Wt 129 lb (58.5 kg)   SpO2 100%   BMI 19.61 kg/m   Wt Readings from Last 3 Encounters:  07/13/24 129 lb (58.5 kg)  07/05/24 132 lb 12.8 oz (60.2 kg)   04/19/24 129 lb 3.2 oz (58.6 kg)    Physical Exam Physical Exam   VITALS: BP- 137/77 CHEST: Lungs clear to auscultation. CARDIOVASCULAR: Regular heart sounds.         Assessment & Plan:   Problem List Items Addressed This Visit   None Visit Diagnoses       Confused    -  Primary   Relevant Orders   Urinalysis, Complete   Urine Culture          Medication management confusion Chronic confusion with medication administration due to similar-colored pills and different brands. Blood pressure well-managed at 137/77 mmHg. - Provided a descriptive printed list of medications with specific administration times. - Recommended using a pill dispenser to organize medications by day and time. - Advised against mixing medications in the same bottle to prevent confusion. - Suggested using pill packs if possible, though [facility] does not offer them.  Essential hypertension Blood pressure well-controlled at 137/77 mmHg. Current regimen includes metoprolol  and triamterene /hydrochlorothiazide. Risk of hypotension from medication confusion. - Ensure correct administration of metoprolol  and triamterene /hydrochlorothiazide without doubling doses. - Monitor blood pressure regularly to ensure it remains within target range.          Follow up plan: Return if symptoms worsen or fail to improve.  Counseling provided for all of the vaccine components Orders Placed This Encounter  Procedures   Urine Culture   Urinalysis, Complete    Melissa Levins, MD [facility] 07/13/2024, 3:32 PM     "

## 2024-07-13 NOTE — Patient Instructions (Addendum)
"    Metoprolol  50 am and pm   Or   Triamterene -hydrochlorothiazide take 1 daily  Eliquis  5, take half tablet am and pm  Anastrozole  take AM "

## 2024-07-15 LAB — URINE CULTURE

## 2024-07-16 ENCOUNTER — Ambulatory Visit: Payer: Self-pay | Admitting: Family Medicine

## 2024-07-19 ENCOUNTER — Inpatient Hospital Stay: Payer: MEDICARE | Attending: Hematology and Oncology | Admitting: Hematology and Oncology

## 2024-07-19 DIAGNOSIS — C50911 Malignant neoplasm of unspecified site of right female breast: Secondary | ICD-10-CM | POA: Diagnosis not present

## 2024-07-19 NOTE — Progress Notes (Signed)
 HEMATOLOGY-ONCOLOGY TELEPHONE VISIT PROGRESS NOTE  I connected with our patient on 07/19/24 at 10:30 AM EST by telephone and verified that I am speaking with the correct person using two identifiers.  I discussed the limitations, risks, security and privacy concerns of performing an evaluation and management service by telephone and the availability of in person appointments.  I also discussed with the patient that there may be a patient responsible charge related to this service. The patient expressed understanding and agreed to proceed.   History of Present Illness:  F/U on anastrozole   History of Present Illness [name] is a 89 year old woman with stage IIIB right breast invasive ductal carcinoma managed with anastrozole  who presents for follow-up to assess tumor response and medication tolerance.  She has a large right breast tumor previously complicated by hemorrhage, managed non-surgically with anastrozole  due to age and comorbidities. She and her caregiver note decreased tumor size, no further oozing, and a drying wound with dark spots consistent with healing. She reports no new or worsening local symptoms.  She reports no significant adverse effects from anastrozole , including no vasomotor symptoms. She remains on anticoagulation for atrial fibrillation. She notes feeling cold frequently but denies other systemic symptoms.       Oncology History  Invasive ductal carcinoma of breast, female, right (HCC)  04/11/2024 Initial Diagnosis   Invasive ductal carcinoma of breast, female, right (HCC)   04/19/2024 Cancer Staging   Staging form: Breast, AJCC 8th Edition - Clinical: Stage IIIB (cT4, cN1, cM0, G2, ER+, PR+, HER2-) - Signed by Odean Potts, MD on 04/19/2024 Stage prefix: Initial diagnosis Histologic grading system: 3 grade system     REVIEW OF SYSTEMS:   Constitutional: Denies fevers, chills or abnormal weight loss All other systems were reviewed with the patient  and are negative. Observations/Objective:     Assessment Plan:  Invasive ductal carcinoma of breast, female, right (HCC) Large fungating right breast mass with bleeding: Mammogram 04/20/2023: Right breast: 5.5 cm mass, 2 enlarged lymph nodes in right axilla 03/29/2024: Right breast biopsy: IDC ER 100%, PR 100%, Ki67 15%, HER2 1+  Treatment:Patient decided to do antiestrogen therapy and not do surgery   Current treatment: Anastrozole  1 mg daily started 04/19/2024 Anastrozole  toxicities: tolerating it well  Patient reports that the cancer is drying up and is responding to the treatment.  Return to clinic in 6 months for telephone follow-up     I discussed the assessment and treatment plan with the patient. The patient was provided an opportunity to ask questions and all were answered. The patient agreed with the plan and demonstrated an understanding of the instructions. The patient was advised to call back or seek an in-person evaluation if the symptoms worsen or if the condition fails to improve as anticipated.   I provided 20 minutes of non-face-to-face time during this encounter.  This includes time for charting and coordination of care   Naomi MARLA Odean, MD

## 2024-07-19 NOTE — Assessment & Plan Note (Signed)
 Large fungating right breast mass with bleeding: Mammogram 04/20/2023: Right breast: 5.5 cm mass, 2 enlarged lymph nodes in right axilla 03/29/2024: Right breast biopsy: IDC ER 100%, PR 100%, Ki67 15%, HER2 1+  Treatment:Patient decided to do antiestrogen therapy and not do surgery   Current treatment: Anastrozole  1 mg daily started 04/19/2024 Anastrozole  toxicities:  Return to clinic in 6 months for follow-up

## 2024-07-20 ENCOUNTER — Other Ambulatory Visit: Payer: MEDICARE

## 2024-07-20 ENCOUNTER — Ambulatory Visit: Payer: Self-pay

## 2024-07-20 NOTE — Telephone Encounter (Signed)
 Noted.

## 2024-07-20 NOTE — Telephone Encounter (Signed)
 FYI Only or Action Required?: Action required by provider: update on patient condition.  Patient was last seen in primary care on 07/13/2024 by Dettinger, Fonda LABOR, MD.  Called Nurse Triage reporting Fatigue.  Symptoms began yesterday.  Interventions attempted: Nothing.  Symptoms are: gradually worsening. Son reports pt. Is very weak, difficulty walking, sleeping more. Complains of dizziness. Concerned.  Triage Disposition: Call EMS 911 Now  Patient/caregiver understands and will follow disposition?: Yes       Summary: dizzy   Reason for Triage: dizzy, can't stay awake, unable to walk     Reason for Disposition  [1] SEVERE weakness (e.g., unable to walk or barely able to walk, requires support) AND [2] new-onset or getting worse  Answer Assessment - Initial Assessment Questions 1. DESCRIPTION: Describe how you are feeling.     Very weak 2. SEVERITY: How bad is it?  Can you stand and walk?     Having difficulty 3. ONSET: When did these symptoms begin? (e.g., hours, days, weeks, months)     yesterday 4. CAUSE: What do you think is causing the weakness or fatigue? (e.g., not drinking enough fluids, medical problem, trouble sleeping)     unsure 5. NEW MEDICINES:  Have you started on any new medicines recently? (e.g., opioid pain medicines, benzodiazepines, muscle relaxants, antidepressants, antihistamines, neuroleptics, beta blockers)     no 6. OTHER SYMPTOMS: Do you have any other symptoms? (e.g., chest pain, fever, cough, SOB, vomiting, diarrhea, bleeding, other areas of pain)     no 7. PREGNANCY: Is there any chance you are pregnant? When was your last menstrual period?     no  Protocols used: Weakness (Generalized) and Fatigue-A-AH

## 2024-07-26 ENCOUNTER — Ambulatory Visit: Payer: Self-pay
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
## 2024-08-02 ENCOUNTER — Telehealth: Payer: Self-pay | Admitting: Cardiovascular Disease

## 2024-08-02 DIAGNOSIS — I872 Venous insufficiency (chronic) (peripheral): Secondary | ICD-10-CM

## 2024-08-02 DIAGNOSIS — I10 Essential (primary) hypertension: Secondary | ICD-10-CM

## 2024-08-02 DIAGNOSIS — Z79899 Other long term (current) drug therapy: Secondary | ICD-10-CM

## 2024-08-02 DIAGNOSIS — I480 Paroxysmal atrial fibrillation: Secondary | ICD-10-CM

## 2024-08-02 DIAGNOSIS — R600 Localized edema: Secondary | ICD-10-CM

## 2024-08-02 DIAGNOSIS — Z950 Presence of cardiac pacemaker: Secondary | ICD-10-CM

## 2024-08-02 NOTE — Telephone Encounter (Signed)
 Sorry, but I will be unavailable until the week after next. I do not think any of the current meds are responsible.  I would strongly recommend sitting down with Palliative Care. Can we please make the referral.

## 2024-08-02 NOTE — Telephone Encounter (Signed)
 Pts son states she's been confused and he's unsure if it might be the medication she's taking.
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
## 2024-08-03 NOTE — Telephone Encounter (Signed)
 Called patients son, [name], to discuss Dr. Prentiss recommendation for a palliative care referral. Explained that palliative care is not hospice and does not mean stopping treatment, but rather provides an additional layer of support for patients with complex medical needs and age-related changes. Reviewed that palliative care can assist with symptom management including anxiety, confusion, sleep issues, and sundowning behaviors, caregiver support, care coordination, and planning for additional help in the home if needed. Emphasized that the referral is for consultation and does not obligate him to any specific plan. Son verbalized understanding and was encouraged to ask questions or reach out with concerns, gave son reassurance that he was doing a great job taking care of his mom.   Referral placed for [facility].

## 2024-09-19 ENCOUNTER — Ambulatory Visit: Payer: MEDICARE
